# Patient Record
Sex: Male | Born: 1955 | Race: Black or African American | Hispanic: No | Smoking: Current every day smoker
Health system: Southern US, Community
[De-identification: ages and names within clinical notes are randomized; demographics above are authoritative.]

## PROBLEM LIST (undated history)

## (undated) DIAGNOSIS — Z8673 Personal history of transient ischemic attack (TIA), and cerebral infarction without residual deficits: Secondary | ICD-10-CM

## (undated) DIAGNOSIS — Z0000 Encounter for general adult medical examination without abnormal findings: Secondary | ICD-10-CM

## (undated) DIAGNOSIS — R7989 Other specified abnormal findings of blood chemistry: Secondary | ICD-10-CM

## (undated) DIAGNOSIS — R7881 Bacteremia: Secondary | ICD-10-CM

## (undated) DIAGNOSIS — A419 Sepsis, unspecified organism: Secondary | ICD-10-CM

## (undated) DIAGNOSIS — I69993 Ataxia following unspecified cerebrovascular disease: Secondary | ICD-10-CM

## (undated) DIAGNOSIS — I639 Cerebral infarction, unspecified: Secondary | ICD-10-CM

## (undated) DIAGNOSIS — R509 Fever, unspecified: Secondary | ICD-10-CM

## (undated) DIAGNOSIS — I10 Essential (primary) hypertension: Secondary | ICD-10-CM

## (undated) DIAGNOSIS — F17200 Nicotine dependence, unspecified, uncomplicated: Secondary | ICD-10-CM

## (undated) DIAGNOSIS — I679 Cerebrovascular disease, unspecified: Secondary | ICD-10-CM

## (undated) DIAGNOSIS — H532 Diplopia: Secondary | ICD-10-CM

## (undated) DIAGNOSIS — R4189 Other symptoms and signs involving cognitive functions and awareness: Secondary | ICD-10-CM

## (undated) DIAGNOSIS — R4182 Altered mental status, unspecified: Secondary | ICD-10-CM

## (undated) DIAGNOSIS — E785 Hyperlipidemia, unspecified: Secondary | ICD-10-CM

## (undated) DIAGNOSIS — F209 Schizophrenia, unspecified: Secondary | ICD-10-CM

## (undated) DIAGNOSIS — I6330 Cerebral infarction due to thrombosis of unspecified cerebral artery: Secondary | ICD-10-CM

## (undated) DIAGNOSIS — D72829 Elevated white blood cell count, unspecified: Secondary | ICD-10-CM

## (undated) DIAGNOSIS — R2990 Unspecified symptoms and signs involving the nervous system: Secondary | ICD-10-CM

## (undated) DIAGNOSIS — I5022 Chronic systolic (congestive) heart failure: Secondary | ICD-10-CM

## (undated) DIAGNOSIS — N10 Acute tubulo-interstitial nephritis: Secondary | ICD-10-CM

## (undated) DIAGNOSIS — R42 Dizziness and giddiness: Secondary | ICD-10-CM

## (undated) DIAGNOSIS — E119 Type 2 diabetes mellitus without complications: Secondary | ICD-10-CM

## (undated) DIAGNOSIS — H5120 Internuclear ophthalmoplegia, unspecified eye: Secondary | ICD-10-CM

## (undated) DIAGNOSIS — R778 Other specified abnormalities of plasma proteins: Secondary | ICD-10-CM

## (undated) DIAGNOSIS — I5032 Chronic diastolic (congestive) heart failure: Secondary | ICD-10-CM

## (undated) DIAGNOSIS — I110 Hypertensive heart disease with heart failure: Secondary | ICD-10-CM

## (undated) HISTORY — DX: Other specified abnormal findings of blood chemistry: R79.89

## (undated) HISTORY — DX: Sepsis, unspecified organism: A41.9

## (undated) HISTORY — DX: Fever, unspecified: R50.9

## (undated) HISTORY — DX: Type 2 diabetes mellitus without complications: E11.9

## (undated) HISTORY — DX: Schizophrenia, unspecified: F20.9

## (undated) HISTORY — DX: Altered mental status, unspecified: R41.82

## (undated) HISTORY — DX: Other symptoms and signs involving cognitive functions and awareness: R41.89

## (undated) HISTORY — DX: Cerebrovascular disease, unspecified: I67.9

## (undated) HISTORY — DX: Internuclear ophthalmoplegia, unspecified eye: H51.20

## (undated) HISTORY — DX: Essential (primary) hypertension: I10

## (undated) HISTORY — DX: Encounter for general adult medical examination without abnormal findings: Z00.00

## (undated) HISTORY — DX: Dizziness and giddiness: R42

## (undated) HISTORY — DX: Chronic diastolic (congestive) heart failure: I50.32

## (undated) HISTORY — DX: Hyperlipidemia, unspecified: E78.5

## (undated) HISTORY — DX: Ataxia following unspecified cerebrovascular disease: I69.993

## (undated) HISTORY — DX: Personal history of transient ischemic attack (TIA), and cerebral infarction without residual deficits: Z86.73

## (undated) HISTORY — DX: Nicotine dependence, unspecified, uncomplicated: F17.200

## (undated) HISTORY — DX: Bacteremia: R78.81

## (undated) HISTORY — DX: Chronic systolic (congestive) heart failure: I50.22

## (undated) HISTORY — DX: Cerebral infarction due to thrombosis of unspecified cerebral artery: I63.30

## (undated) HISTORY — DX: Acute pyelonephritis: N10

## (undated) HISTORY — DX: Unspecified symptoms and signs involving the nervous system: R29.90

## (undated) HISTORY — DX: Diplopia: H53.2

## (undated) HISTORY — DX: Elevated white blood cell count, unspecified: D72.829

## (undated) HISTORY — DX: Cerebral infarction, unspecified: I63.9

## (undated) HISTORY — DX: Hypertensive heart disease with heart failure: I11.0

## (undated) HISTORY — DX: Other specified abnormalities of plasma proteins: R77.8

---

## 2008-03-11 ENCOUNTER — Inpatient Hospital Stay (HOSPITAL_COMMUNITY): Admission: EM | Admit: 2008-03-11 | Discharge: 2008-03-14 | Payer: Self-pay | Admitting: Emergency Medicine

## 2008-04-08 ENCOUNTER — Encounter (INDEPENDENT_AMBULATORY_CARE_PROVIDER_SITE_OTHER): Payer: Self-pay | Admitting: *Deleted

## 2008-04-08 ENCOUNTER — Ambulatory Visit: Payer: Self-pay | Admitting: Internal Medicine

## 2008-04-08 DIAGNOSIS — E785 Hyperlipidemia, unspecified: Secondary | ICD-10-CM | POA: Insufficient documentation

## 2008-04-08 DIAGNOSIS — Z8619 Personal history of other infectious and parasitic diseases: Secondary | ICD-10-CM

## 2008-04-08 DIAGNOSIS — F17200 Nicotine dependence, unspecified, uncomplicated: Secondary | ICD-10-CM | POA: Insufficient documentation

## 2008-04-08 HISTORY — DX: Nicotine dependence, unspecified, uncomplicated: F17.200

## 2008-04-08 HISTORY — DX: Hyperlipidemia, unspecified: E78.5

## 2008-04-08 LAB — CONVERTED CEMR LAB: Microalb Creat Ratio: 10.4 mg/g (ref 0.0–30.0)

## 2008-08-09 ENCOUNTER — Inpatient Hospital Stay (HOSPITAL_COMMUNITY): Admission: EM | Admit: 2008-08-09 | Discharge: 2008-08-10 | Payer: Self-pay | Admitting: Emergency Medicine

## 2008-08-09 ENCOUNTER — Ambulatory Visit: Payer: Self-pay | Admitting: Internal Medicine

## 2008-08-09 ENCOUNTER — Encounter: Payer: Self-pay | Admitting: Internal Medicine

## 2008-08-19 ENCOUNTER — Telehealth: Payer: Self-pay | Admitting: *Deleted

## 2008-09-10 ENCOUNTER — Encounter (INDEPENDENT_AMBULATORY_CARE_PROVIDER_SITE_OTHER): Payer: Self-pay | Admitting: *Deleted

## 2008-09-10 ENCOUNTER — Ambulatory Visit: Payer: Self-pay | Admitting: Internal Medicine

## 2008-09-10 LAB — CONVERTED CEMR LAB: Hgb A1c MFr Bld: 7.4 %

## 2008-09-11 ENCOUNTER — Encounter (INDEPENDENT_AMBULATORY_CARE_PROVIDER_SITE_OTHER): Payer: Self-pay | Admitting: *Deleted

## 2008-09-11 ENCOUNTER — Ambulatory Visit: Payer: Self-pay | Admitting: Internal Medicine

## 2008-09-11 LAB — CONVERTED CEMR LAB
ALT: 11 units/L (ref 0–53)
AST: 20 units/L (ref 0–37)
Calcium: 9.2 mg/dL (ref 8.4–10.5)
Cholesterol: 133 mg/dL (ref 0–200)
Creatinine, Ser: 1.3 mg/dL (ref 0.40–1.50)
LDL Cholesterol: 89 mg/dL (ref 0–99)
Total Bilirubin: 0.4 mg/dL (ref 0.3–1.2)
Total CHOL/HDL Ratio: 3.9
Total Protein: 6.9 g/dL (ref 6.0–8.3)
Triglycerides: 50 mg/dL (ref <150)

## 2009-02-10 ENCOUNTER — Telehealth (INDEPENDENT_AMBULATORY_CARE_PROVIDER_SITE_OTHER): Payer: Self-pay | Admitting: Internal Medicine

## 2009-11-06 ENCOUNTER — Ambulatory Visit: Payer: Self-pay | Admitting: Cardiology

## 2009-11-06 ENCOUNTER — Encounter: Payer: Self-pay | Admitting: Internal Medicine

## 2009-11-06 ENCOUNTER — Ambulatory Visit: Payer: Self-pay | Admitting: Internal Medicine

## 2009-11-06 ENCOUNTER — Ambulatory Visit: Payer: Self-pay | Admitting: Infectious Diseases

## 2009-11-06 ENCOUNTER — Telehealth: Payer: Self-pay | Admitting: Internal Medicine

## 2009-11-06 ENCOUNTER — Inpatient Hospital Stay (HOSPITAL_COMMUNITY): Admission: EM | Admit: 2009-11-06 | Discharge: 2009-11-13 | Payer: Self-pay | Admitting: Emergency Medicine

## 2009-11-06 DIAGNOSIS — R269 Unspecified abnormalities of gait and mobility: Secondary | ICD-10-CM

## 2009-11-07 ENCOUNTER — Encounter: Payer: Self-pay | Admitting: Internal Medicine

## 2009-11-07 ENCOUNTER — Ambulatory Visit: Payer: Self-pay | Admitting: Vascular Surgery

## 2009-11-11 ENCOUNTER — Ambulatory Visit: Payer: Self-pay | Admitting: Physical Medicine & Rehabilitation

## 2009-11-12 ENCOUNTER — Encounter: Payer: Self-pay | Admitting: Internal Medicine

## 2009-11-13 ENCOUNTER — Inpatient Hospital Stay (HOSPITAL_COMMUNITY)
Admission: RE | Admit: 2009-11-13 | Discharge: 2009-11-19 | Payer: Self-pay | Admitting: Physical Medicine & Rehabilitation

## 2009-11-13 ENCOUNTER — Ambulatory Visit: Payer: Self-pay | Admitting: Physical Medicine & Rehabilitation

## 2009-11-13 ENCOUNTER — Encounter: Payer: Self-pay | Admitting: Internal Medicine

## 2009-11-24 ENCOUNTER — Encounter: Payer: Self-pay | Admitting: Internal Medicine

## 2009-12-01 ENCOUNTER — Ambulatory Visit: Payer: Self-pay | Admitting: Internal Medicine

## 2009-12-01 DIAGNOSIS — Z8673 Personal history of transient ischemic attack (TIA), and cerebral infarction without residual deficits: Secondary | ICD-10-CM

## 2009-12-01 DIAGNOSIS — I10 Essential (primary) hypertension: Secondary | ICD-10-CM

## 2009-12-01 LAB — CONVERTED CEMR LAB
ALT: 14 units/L (ref 0–53)
AST: 18 units/L (ref 0–37)
Alkaline Phosphatase: 43 units/L (ref 39–117)
CO2: 27 meq/L (ref 19–32)
Calcium: 9 mg/dL (ref 8.4–10.5)
Creatinine, Ser: 1.17 mg/dL (ref 0.40–1.50)
Free T4: 0.96 ng/dL (ref 0.80–1.80)
Glucose, Bld: 98 mg/dL (ref 70–99)
Potassium: 4.2 meq/L (ref 3.5–5.3)
TSH: 0.603 microintl units/mL (ref 0.350–4.5)

## 2009-12-22 ENCOUNTER — Encounter
Admission: RE | Admit: 2009-12-22 | Discharge: 2009-12-23 | Payer: Self-pay | Admitting: Physical Medicine & Rehabilitation

## 2009-12-23 ENCOUNTER — Ambulatory Visit: Payer: Self-pay | Admitting: Physical Medicine & Rehabilitation

## 2010-01-02 ENCOUNTER — Ambulatory Visit: Payer: Self-pay | Admitting: Infectious Disease

## 2010-01-02 LAB — CONVERTED CEMR LAB
Blood Glucose, Fingerstick: 261
Hgb A1c MFr Bld: 6.6 %

## 2010-01-30 ENCOUNTER — Ambulatory Visit: Payer: Self-pay | Admitting: Internal Medicine

## 2010-02-09 ENCOUNTER — Telehealth: Payer: Self-pay | Admitting: Internal Medicine

## 2010-02-23 ENCOUNTER — Telehealth: Payer: Self-pay | Admitting: Internal Medicine

## 2010-03-04 ENCOUNTER — Ambulatory Visit: Payer: Self-pay | Admitting: Internal Medicine

## 2010-03-04 DIAGNOSIS — K089 Disorder of teeth and supporting structures, unspecified: Secondary | ICD-10-CM | POA: Insufficient documentation

## 2010-03-04 LAB — HM DIABETES FOOT EXAM

## 2010-03-04 LAB — CONVERTED CEMR LAB: Blood Glucose, Fingerstick: 119

## 2010-07-01 ENCOUNTER — Telehealth: Payer: Self-pay | Admitting: Internal Medicine

## 2010-09-15 NOTE — Miscellaneous (Signed)
Summary: Hospital Admission  INTERNAL MEDICINE ADMISSION HISTORY AND PHYSICAL  PCP: Dr. Quentin MullingDenton Meek [phone] R2:Vega [phone] Attending: Dr. Doneen Poisson   FA:[id number] numbness  HPI: 55 y/o man with hx of diambetes, HLD, and schizophrenia, presented to ED with symptoms that started at 8 am on the day of admission; right after eating breakfast when he noticed that his right leg felt numb; he tried to get up and had a trouble walking. He felt that his left RE was "weak" and felt "off balance." He denied vertigo, tinnitus, HA, visual or speech deficits, facial assymtery, drooling, chest pain, SOB, or other neurologic symptoms. Episode is new to the patient; was not witnessed by anyone.  ALLERGIES: NKDA  PAST MEDICAL HISTORY: Diabetes mellitus, type II -->Hgb A1C 7.4 on 08/2009. Schizophrenia HLD -> cholesterol 133; TG 50; HDL 34; LDL 89  MEDICATIONS: METFORMIN HCL 500 MG TABS (METFORMIN HCL) Take two tablets by mouth in the morning, and one tablet in the evening ADULT ASPIRIN EC LOW STRENGTH 81 MG TBEC (ASPIRIN) Take one pill by mouth daily -->not taking NIASPAN 500 MG CR-TABS (NIACIN (ANTIHYPERLIPIDEMIC)) Take one pill by mouth daily-->not taking   SOCIAL HISTORY: Single; lives with his sister Current Smoker :1PPDx 7 years Alcohol use-no Drug use-no Regular exercise-yes   FAMILY HISTORY  Family History of CAD Male 1st degree relative <60 Family History of CAD Male 1st degree relative <50 Family History Diabetes 1st degree relative Family History Hypertension  ROS:per HPI  VITALS: T: 97.5  P:61  BP: 182/92> 173/86 R: 16 O2SAT:98% on RA PHYSICAL EXAM: General:  alert, well-developed, and cooperative to examination.   Head:  normocephalic and atraumatic.   Eyes:  vision grossly intact, pupils equal, pupils round, pupils reactive to light, no injection and anicteric.   Mouth:  pharynx pink and moist, no erythema, and no exudates.   Neck:  supple, full ROM,  no thyromegaly, no JVD, and no carotid bruits.   Lungs:  normal respiratory effort, no accessory muscle use, normal breath sounds, no crackles, and no wheezes. CV: RRR, no M, S3, S4, no lifts or rubs. Abdomen: ND; BS+, NTTP, no HSM MSK: FROM of all extremities proximately and distally bialterally; no joint erythema, effusion or increased warmth to touch bilaterally. Neurologic:  alert & oriented X3, cranial nerves III-XII intact, strength normal in all extremities, sensation intact to light touch on the left; and absent to R UE (C4-7 dermatopmes) and R LE (L2-S1 dermatomes), and gait is slightly ataxic; unable to perform Romberg test due to lack of understanding. Skin:  turgor normal and no rashes.   Psych:  Oriented X3, memory intact for recent and remote, minimally interactive, good eye contact, not anxious appearing, and flat affect.  LABS: WBC                                      9.4               4.0-10.5         K/uL  RBC                                      4.99              4.22-5.81        MIL/uL  Hemoglobin (HGB)  14.2              13.0-17.0        g/dL  Hematocrit (HCT)                         43.4              39.0-52.0        %  MCV                                      87.0              78.0-100.0       fL  MCHC                                     32.8              30.0-36.0        g/dL  RDW                                      14.7              11.5-15.5        %  Platelet Count (PLT)                     176               150-400          K/uL  Neutrophils, %                           66                43-77            %  Lymphocytes, %                           24                12-46            %  Monocytes, %                             7                 3-12             %  Eosinophils, %                           3                 0-5              %  Basophils, %                             1                 0-1              %  Neutrophils, Absolute                     6.2               1.7-7.7          K/uL  Lymphocytes, Absolute                    2.2               0.7-4.0          K/uL  Monocytes, Absolute                      0.6               0.1-1.0          K/uL  Eosinophils, Absolute                    0.2               0.0-0.7          K/uL  Basophils, Absolute                      0.1               0.0-0.1          K/uL  Protime ( Prothrombin Time)              13.2              11.6-15.2        seconds  INR                                      1.01              0.00-1.49  PTT(a-Partial Thromboplastn Time)        28                24-37            seconds  Sodium (NA)                              138               135-145          mEq/L  Potassium (K)                            4.3               3.5-5.1          mEq/L  Chloride                                 104               96-112           mEq/L  CO2                                      26  19-32            mEq/L  Glucose                                  132        h      70-99            mg/dL  BUN                                      12                6-23             mg/dL  Creatinine                               1.23(1.3 in 08/2009)             0.4-1.5          mg/dL  GFR, Est Non African American            >60               >60              mL/min  GFR, Est African American                >60               >60              mL/min    Oversized comment, see footnote  1  Bilirubin, Total                         0.4               0.3-1.2          mg/dL  Alkaline Phosphatase                     46                39-117           U/L  SGOT (AST)                               24                0-37             U/L  SGPT (ALT)                               14                0-53             U/L  Total  Protein                           7.7               6.0-8.3          g/dL  Albumin-Blood  3.8               3.5-5.2          g/dL  Calcium                                   9.3               8.4-10.5         mg/dL  Creatine Kinase, Total                   274        h      7-232            U/L  CK, MB                                   3.8               0.3-4.0          ng/mL  Relative Index                           1.4               0.0-2.5  Troponin I                               <0.01             0.00-0.06        ng/mL    NO INDICATION OF    MYOCARDIAL INJURY.  CR OF HEAD w/o contrast. IMPRESSION:   No identifiably acute abnormality.  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   Indeterminate age white matter infarctions within the cerebral   hemispheres, likely old.  ASSESSMENT AND PLAN: (1)Right -sided paresthesia with subjective motor weakness. Although CT of head was negative, symptoms are  concerning for a TIA vs small CVA (cerebellar infarct). Additional differential list of diagnoses is extensive and include illicit drug OD, hypothyroidism,sarcoidoses, lead poisoning, Diabetic neuropathy, paraneoplastic syndrome; HIV infection, neurosyphilis, chronic inflammatory demyelinating plyenuropathy, spinal cord lesion vs nerve root compression, migraine, partial seizure, and MS. Schizophrenia -related conversion disorder vs catatonia; postprandial hypotension. Will check UDS, TSH, HIV, RPR, 2D ECHO, EKG, MRA and MRI of brain. ASA, Crestor; Fall precautions. (2)DM: Check A1c, will cover with SSI. (3)HTN (?) : no known history of HTN although last reading taken in 10/2009 was 152/96. Will not initiate anti-hypertensive therapy for now. Will monitor closely. (4)Schizophrenia: patient is not currently on any known antispychotic therapy. Has some mild negative symptoms; otherwise appears stable. (5)VTE PROPH: lovenox

## 2010-09-15 NOTE — Progress Notes (Signed)
Summary: med refill/gp  Phone Note Refill Request Message from:  Fax from Pharmacy on November 06, 2009 2:37 PM  Refills Requested: Medication #1:  METFORMIN HCL 500 MG TABS Take two tablets by mouth in the morning   Dosage confirmed as above?Dosage Confirmed   Last Refilled: 10/13/2009  Method Requested: Electronic Initial call taken by: Chinita Pester RN,  November 06, 2009 2:38 PM    Prescriptions: METFORMIN HCL 500 MG TABS (METFORMIN HCL) Take two tablets by mouth in the morning, and one tablet in the evening  #93 x 5   Entered and Authorized by:   Jackson Latino MD   Signed by:   Jackson Latino MD on 11/07/2009   Method used:   Electronically to        [facility] [id number] (retail)       [address] [place], [place]  [address]       Ph: [phone]       Fax: [phone]   RxID:   [id number]

## 2010-09-15 NOTE — Miscellaneous (Signed)
Summary: Hospital discharge summary  Hospital Discharge  Date of admission:11/06/2009  Date of discharge:anticipated 11/14/2009 -->transfer to an inpatient rehabilitation.  Brief reason for admission/active problems: 1. 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 Otherwise, neurologically stable. Started on ASA and crestor; transfer for CIR for  PT/OT is pending. 2. HTN --prinivil 10 mg by mouth daily. 3. DM, type 2 --controlled. 4. Schizophrenia --not on any antipsychotic meds; being followed by mental health depratment; stable.  Followup needed:Reassess anti-HTN regimen; and neurological fxn.  The medication and problem lists have been updated.  Please see the dictated discharge summary for details.   Medications: Removed medication of NIASPAN 500 MG CR-TABS (NIACIN (ANTIHYPERLIPIDEMIC)) Take one pill by mouth daily. - Signed Added new medication of CRESTOR 10 MG TABS (ROSUVASTATIN CALCIUM) Take 1 tab by mouth at bedtime - Signed Rx of CRESTOR 10 MG TABS (ROSUVASTATIN CALCIUM) Take 1 tab by mouth at bedtime;  #30 x 3;  Signed;  Entered by: Deatra Robinson MD;  Authorized by: Deatra Robinson MD;  Method used: Electronically to [facility] [id number] [place], [place], [place]  [address], Ph: [phone], Fax: [phone] Observations: Added new observation of INSTRUCTIONS: Please make an appointment at the outpatient clinic at [facility]  for a followup visit once being discharge from physical therapy inpatient rehabilitation.  Please take your medication as prescribed below. (11/12/2009 10:30)    Prescriptions: CRESTOR 10 MG TABS (ROSUVASTATIN CALCIUM) Take 1 tab by mouth at bedtime  #30 x 3   Entered and Authorized by:   Deatra Robinson MD   Signed by:   Deatra Robinson MD on 11/12/2009   Method used:   Electronically to        [facility] [id number] (retail)       [address]  [place], [place]  [address]       Ph: [phone]       Fax: [phone]   RxID:   [id number]    Patient Instructions: 1)  Please make an appointment at the outpatient clinic at [facility]  for a followup visit once being discharge from physical therapy inpatient rehabilitation. 2)  Please take your medication as prescribed below.

## 2010-09-15 NOTE — Progress Notes (Signed)
Summary: med refill/gp  Phone Note Refill Request Message from:  Patient's sister on February 09, 2010 9:33 AM  Refills Requested: Medication #1:  METFORMIN HCL 500 MG TABS Take two tablets by mouth in the morning   Dosage confirmed as above?Dosage Confirmed He takes 2 tabs in the morning and 2 tabs in the evening;his sister stated he has not been receving enough pills  to last the month.   Method Requested: Electronic Initial call taken by: Chinita Pester RN,  February 09, 2010 9:33 AM  Follow-up for Phone Call        Rx completed in Dr. Tiajuana Amass Follow-up by: Jackson Latino MD,  February 09, 2010 1:27 PM    Prescriptions: METFORMIN HCL 500 MG TABS (METFORMIN HCL) Take two tablets by mouth in the morning, and two tablets in the evening  #120 x 6   Entered and Authorized by:   Jackson Latino MD   Signed by:   Jackson Latino MD on 02/09/2010   Method used:   Electronically to        [facility] [id number] (retail)       [address] [place], [place]  [address]       Ph: [phone]       Fax: [phone]   RxID:   [id number]

## 2010-09-15 NOTE — Assessment & Plan Note (Signed)
Summary: EST-1 MONTH F/U VISIT PER [facility]   Vital Signs:  Patient profile:   55 year old male Height:      70 inches (177.80 cm) Weight:      169.01 pounds (76.82 kg) BMI:     24.34 Temp:     98.8 degrees F (37.11 degrees C) oral Pulse rate:   68 / minute BP sitting:   135 / 84  (left arm)  Vitals Entered By: Angelina Ok RN (January 30, 2010 9:56 AM) Is Patient Diabetic? Yes Did you bring your meter with you today? Yes Pain Assessment Patient in pain? no      Nutritional Status BMI of 19 -24 = normal CBG Result 158  Have you ever been in a relationship where you felt threatened, hurt or afraid?No   Does patient need assistance? Functional Status Self care Ambulation Normal Comments Check up since changing meds.   Primary Care Provider:  Jackson Latino MD   History of Present Illness: 55 yo m with Past Medical History of DM, HTN comes to the office for a follow up after recent change of his medications. During last visit, we increased his metformoin to 1000mg  two times a day from 500mg  and stopped HCTZ due to soft BP. He has been doing well since that change, no chest pain, SOB, or fever. His CBG has been well controlled runs 92-160s. No diarrhea OR dysuria, no melena. Current  smoker about 1/2 PPD, no ETOH or drug abuse.   Depression History:      The patient denies a depressed mood most of the day and a diminished interest in his usual daily activities.         Preventive Screening-Counseling & Management  Alcohol-Tobacco     Smoking Status: quit < 6 months     Smoking Cessation Counseling: yes     Packs/Day: < a pack per week     Year Started: 20 years     Year Quit: 10/2009  Comments: Smokes every now and then now  Problems Prior to Update: 1)  Cerebrovascular Accident, Hx of  (ICD-V12.50) 2)  Hypertension, Mild  (ICD-401.1) 3)  Gait Imbalance  (ICD-781.2) 4)  Diabetes Mellitus, Type II  (ICD-250.00) 5)  Tobacco Abuse  (ICD-305.1) 6)  Dyslipidemia   (ICD-272.4) 7)  Diabetes-type 2  (ICD-250.00) 8)  Dm  (ICD-250.00) 9)  Candidiasis, Oral, Hx of  (ICD-V12.09)  Medications Prior to Update: 1)  Metformin Hcl 500 Mg Tabs (Metformin Hcl) .... Take Two Tablets By Mouth in The Morning, and Two Tablets in The Evening 2)  Ecotrin 325 Mg Tbec (Aspirin) .... Take 1 Tablet By Mouth Once A Day 3)  Crestor 20 Mg Tabs (Rosuvastatin Calcium) .... Take 1 Tablet By Mouth Once A Day 4)  Lisinopril 20 Mg Tabs (Lisinopril) .... Take 1 Tablet By Mouth Once A Day  Current Medications (verified): 1)  Metformin Hcl 500 Mg Tabs (Metformin Hcl) .... Take Two Tablets By Mouth in The Morning, and Two Tablets in The Evening 2)  Ecotrin 325 Mg Tbec (Aspirin) .... Take 1 Tablet By Mouth Once A Day 3)  Crestor 20 Mg Tabs (Rosuvastatin Calcium) .... Take 1 Tablet By Mouth Once A Day 4)  Lisinopril 20 Mg Tabs (Lisinopril) .... Take 1 Tablet By Mouth Once A Day  Allergies (verified): No Known Drug Allergies  Past History:  Past Medical History: Last updated: 04/08/2008 Diabetes mellitus, type II  Family History: Last updated: 04/08/2008 Family History of CAD Male 1st  degree relative <60 Family History of CAD Male 1st degree relative <50 Family History Diabetes 1st degree relative Family History Hypertension  Social History: Last updated: 04/08/2008 Single Current Smoker Alcohol use-no Drug use-no Regular exercise-yes  Risk Factors: Smoking Status: quit < 6 months (01/30/2010) Packs/Day: < a pack per week (01/30/2010)  Family History: Reviewed history from 04/08/2008 and no changes required. Family History of CAD Male 1st degree relative <60 Family History of CAD Male 1st degree relative <50 Family History Diabetes 1st degree relative Family History Hypertension  Social History: Reviewed history from 04/08/2008 and no changes required. Single Current Smoker Alcohol use-no Drug use-no Regular exercise-yes Packs/Day:  < a pack per  week  Review of Systems  The patient denies anorexia, fever, decreased hearing, chest pain, syncope, dyspnea on exertion, peripheral edema, abdominal pain, melena, hematochezia, and abnormal bleeding.    Physical Exam  General:  alert, well-developed, well-nourished, and well-hydrated.   Head:  normocephalic.   Nose:  no nasal discharge.   Mouth:  pharynx pink and moist.   Neck:  supple.   Lungs:  normal respiratory effort, normal breath sounds, no crackles, and no wheezes.   Heart:  normal rate, regular rhythm, no murmur, and no JVD.   Abdomen:  soft, non-tender, normal bowel sounds, and no masses.   Msk:  normal ROM, no joint tenderness, no joint swelling, and no joint warmth.   Pulses:  2+ Extremities:  Edema. Neurologic:  alert & oriented X3, cranial nerves II-XII intact, strength normal in all extremities, sensation intact to light touch, and gait normal.     Impression & Recommendations:  Problem # 1:  DIABETES MELLITUS, TYPE II (ICD-250.00) Assessment Improved His DM well controlle dand at target. Will continue current meds. His updated medication list for this problem includes:    Metformin Hcl 500 Mg Tabs (Metformin hcl) .Marland Kitchen... Take two tablets by mouth in the morning, and two tablets in the evening    Ecotrin 325 Mg Tbec (Aspirin) .Marland Kitchen... Take 1 tablet by mouth once a day    Lisinopril 20 Mg Tabs (Lisinopril) .Marland Kitchen... Take 1 tablet by mouth once a day  Labs Reviewed: Creat: 1.17 (12/01/2009)    Reviewed HgBA1c results: 6.6 (01/02/2010)  7.4 (09/10/2008)  Problem # 2:  HYPERTENSION, MILD (ICD-401.1) Assessment: Improved Since last visit we stopped his HCTZ due to soft BP, his dizziness has resolved. Will continue lisinopril and recheck BP at next visit.  His updated medication list for this problem includes:    Lisinopril 20 Mg Tabs (Lisinopril) .Marland Kitchen... Take 1 tablet by mouth once a day  Problem # 3:  TOBACCO ABUSE (ICD-305.1) Assessment: Comment Only  Encouraged  smoking cessation and discussed different methods for smoking cessation.   Problem # 4:  Preventive Health Care (ICD-V70.0) Assessment: Comment Only He missed GI appointment in May, we rescheduled it. He will see GI doctor on 02/02/2010 for setup screening colonoscopy.  Complete Medication List: 1)  Metformin Hcl 500 Mg Tabs (Metformin hcl) .... Take two tablets by mouth in the morning, and two tablets in the evening 2)  Ecotrin 325 Mg Tbec (Aspirin) .... Take 1 tablet by mouth once a day 3)  Crestor 20 Mg Tabs (Rosuvastatin calcium) .... Take 1 tablet by mouth once a day 4)  Lisinopril 20 Mg Tabs (Lisinopril) .... Take 1 tablet by mouth once a day  Patient Instructions: 1)  Please schedule a follow-up appointment in 6 months. 2)  Tobacco is very bad for your health  and your loved ones! You Should stop smoking!. 3)  Stop Smoking Tips: Choose a Quit date. Cut down before the Quit date. decide what you will do as a substitute when you feel the urge to smoke(gum,toothpick,exercise). 4)  Check your blood sugars regularly. If your readings are usually above : or below 70 you should contact our office. 5)  See your eye doctor yearly to check for diabetic eye damage.   Vital Signs:  Patient profile:   55 year old male Height:      70 inches (177.80 cm) Weight:      169.01 pounds (76.82 kg) BMI:     24.34 Temp:     98.8 degrees F (37.11 degrees C) oral Pulse rate:   68 / minute BP sitting:   135 / 84  (left arm)  Vitals Entered By: Angelina Ok RN (January 30, 2010 9:56 AM)   Prevention & Chronic Care Immunizations   Influenza vaccine: Not documented   Influenza vaccine deferral: Deferred  (12/01/2009)    Tetanus booster: Not documented   Td booster deferral: Deferred  (12/01/2009)    Pneumococcal vaccine: Not documented  Colorectal Screening   Hemoccult: Not documented   Hemoccult action/deferral: Deferred  (12/01/2009)    Colonoscopy: Not documented   Colonoscopy  action/deferral: GI referral  (12/01/2009)  Other Screening   PSA: Not documented   Smoking status: quit < 6 months  (01/30/2010)  Diabetes Mellitus   HgbA1C: 6.6  (01/02/2010)    Eye exam: Not documented    Foot exam: Not documented   High risk foot: Not documented   Foot care education: Not documented    Urine microalbumin/creatinine ratio: 79.7  (12/01/2009)   Urine microalbumin action/deferral: Ordered    Diabetes flowsheet reviewed?: Yes   Progress toward A1C goal: Improved  Lipids   Total Cholesterol: 84  (12/01/2009)   Lipid panel action/deferral: Lipid Panel ordered   LDL: 41  (12/01/2009)   LDL Direct: Not documented   HDL: 26  (12/01/2009)   Triglycerides: 83  (12/01/2009)    SGOT (AST): 18  (12/01/2009)   BMP action: Ordered   SGPT (ALT): 14  (12/01/2009)   Alkaline phosphatase: 43  (12/01/2009)   Total bilirubin: 0.3  (12/01/2009)    Lipid flowsheet reviewed?: Yes   Progress toward LDL goal: At goal  Hypertension   Last Blood Pressure: 135 / 84  (01/30/2010)   Serum creatinine: 1.17  (12/01/2009)   BMP action: Ordered   Serum potassium 4.2  (12/01/2009)    Hypertension flowsheet reviewed?: Yes   Progress toward BP goal: At goal  Self-Management Support :   Personal Goals (by the next clinic visit) :     Personal A1C goal: 7  (12/01/2009)     Personal blood pressure goal: 130/80  (12/01/2009)     Personal LDL goal: 100  (12/01/2009)    Patient will work on the following items until the next clinic visit to reach self-care goals:     Medications and monitoring: take my medicines every day, check my blood sugar, bring all of my medications to every visit, examine my feet every day  (01/30/2010)     Eating: drink diet soda or water instead of juice or soda, eat more vegetables, use fresh or frozen vegetables, eat foods that are low in salt, eat baked foods instead of fried foods, eat fruit for snacks and desserts, limit or avoid alcohol  (01/30/2010)      Activity: take a 30 minute  walk every day  (01/30/2010)    Diabetes self-management support: Copy of home glucose meter record, Written self-care plan, Education handout, Pre-printed educational material, Resources for patients handout  (01/30/2010)   Diabetes care plan printed   Diabetes education handout printed    Hypertension self-management support: Education handout, Pre-printed educational material, Written self-care plan, Resources for patients handout  (01/30/2010)   Hypertension self-care plan printed.   Hypertension education handout printed    Lipid self-management support: Education handout, Pre-printed educational material, Written self-care plan, Resources for patients handout  (01/30/2010)   Lipid self-care plan printed.   Lipid education handout printed      Resource handout printed.

## 2010-09-15 NOTE — Progress Notes (Signed)
Summary: dental assessment/ hla  Phone Note Call from Patient   Caller: sister Reason for Call: Referral Summary of Call: pt's sister, caretaker calls stating pt is c/o of dental pain on and off, multiple areas, was evaluated by a dentist and was told all teeth needed extraction, pt cannot afford to pay out of pocket, spoke w/ dental clinic and needs eval and referral. appt set, pt agreeable. chewing and drinking aggravate and nothing tends to alleviate. pt's sister is reminded that if pt becomes worse and thinks he needs eval before scheduled appt he may call clinic or use ED or urgent care. this is agreeable and sister repeats back. Initial call taken by: Marin Roberts RN,  February 23, 2010 9:12 AM  Follow-up for Phone Call        Agreed with the plan. Follow-up by: Jackson Latino MD,  February 24, 2010 12:06 PM

## 2010-09-15 NOTE — Miscellaneous (Signed)
Summary: [facility]   Imported By: Margie Billet 12/01/2009 15:54:54  _____________________________________________________________________  External Attachment:    Type:   Image     Comment:   External Document

## 2010-09-15 NOTE — Assessment & Plan Note (Signed)
Summary: dental pain on and off, needs eval/pcp-[name] Williams/hla   Vital Signs:  Patient profile:   55 year old male Height:      70 inches Weight:      168.7 pounds BMI:     24.29 Temp:     98.4 degrees F oral Pulse rate:   62 / minute BP sitting:   133 / 86  (right arm)  Vitals Entered By: Filomena Jungling NT II (March 04, 2010 1:35 PM) CC: DENTAL PAIN Is Patient Diabetic? Yes Did you bring your meter with you today? No Pain Assessment Patient in pain? yes     Location: teeth Intensity: 8 Type: aching Onset of pain  3 weeks Nutritional Status BMI of 19 -24 = normal CBG Result 119  Have you ever been in a relationship where you felt threatened, hurt or afraid?No   Does patient need assistance? Functional Status Self care Ambulation Normal   Diabetic Foot Exam Foot Inspection Is there a history of a foot ulcer?              No Is there a foot ulcer now?              No Can the patient see the bottom of their feet?          Yes Are the shoes appropriate in style and fit?          Yes Is there swelling or an abnormal foot shape?          No Are the toenails long?                Yes Are the toenails thick?                Yes Are the toenails ingrown?              No Is there heavy callous build-up?              Yes Is there pain in the calf muscle (Intermittent claudication) when walking?    NoIs there a claw toe deformity?              No Is there elevated skin temperature?            No Is there limited ankle dorsiflexion?            No Is there foot or ankle muscle weakness?            No  Diabetic Foot Care Education Patient educated on appropriate care of diabetic feet.  Comments: DRY SKIN   10-g (5.07) Semmes-Weinstein Monofilament Test Performed by: Filomena Jungling NT II          Right Foot          Left Foot Site 1         normal         normal Site 2         normal         normal Site 3         normal         normal Site 4         normal         normal Site 5          normal         normal Site 6         normal         normal Site  7         normal         normal Site 8         normal         normal Site 9         normal         normal  Impression      normal         normal   Primary Care Provider:  Jackson Latino MD  CC:  DENTAL PAIN.  History of Present Illness: He ia a 55 yo AAM with PMH of DM, HTN comes to the office for teeth pain. He has teeth pain for 3 weeks, no fever, drinage or injury. He has been taking his meds as instructed, no chest pain, SOB. His CBG has been well controlled runs 90-120s. No diarrhea or dysuria, no melena. Current  smoker about 2-3 cigarettes, no ETOH or drug abuse.   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 Take Two Tablets By Mouth in The Morning, and Two Tablets in The Evening 2)  Ecotrin 325 Mg Tbec (Aspirin) .... Take 1 Tablet By Mouth Once A Day 3)  Crestor 20 Mg Tabs (Rosuvastatin Calcium) .... Take 1 Tablet By Mouth Once A Day 4)  Lisinopril 20 Mg Tabs (Lisinopril) .... Take 1 Tablet By Mouth Once A Day  Current Medications (verified): 1)  Metformin Hcl 500 Mg Tabs (Metformin Hcl) .... Take Two Tablets By Mouth in The Morning, and Two Tablets in The Evening 2)  Ecotrin 325 Mg Tbec (Aspirin) .... Take 1 Tablet By Mouth Once A Day 3)  Crestor 20  Mg Tabs (Rosuvastatin Calcium) .... Take 1 Tablet By Mouth Once A Day 4)  Lisinopril 20 Mg Tabs (Lisinopril) .... Take 1 Tablet By Mouth Once A Day  Allergies (verified): No Known Drug Allergies  Past History:  Past Medical History: Last updated: 04/08/2008 Diabetes mellitus, type II  Family History: Last updated: 04/08/2008 Family History of CAD Male 1st degree relative <60 Family History of CAD Male 1st degree relative <50 Family History Diabetes 1st degree relative Family History Hypertension  Risk Factors: Smoking Status: quit < 6 months (03/04/2010) Packs/Day: < a pack per week (03/04/2010)  Family History: Reviewed history from 04/08/2008 and no changes required. Family History of CAD Male 1st degree relative <60 Family History of CAD Male 1st degree relative <50 Family History Diabetes 1st degree relative Family History Hypertension  Social History: Reviewed history from 04/08/2008 and no changes required. Single Current Smoker Alcohol use-no Drug use-no Regular exercise-yes  Review of Systems  The patient denies fever, vision loss, decreased hearing, dyspnea on exertion, peripheral edema, prolonged cough, hemoptysis, abdominal pain, and melena.    Physical  Exam  General:  alert, well-developed, well-nourished, and well-hydrated.   Nose:  no nasal discharge.   Mouth:  poor dentition and teeth missing.   Neck:  supple.   Lungs:  normal respiratory effort, no accessory muscle use, normal breath sounds, no crackles, and no wheezes.   Heart:  normal rate, regular rhythm, no murmur, no JVD, and no HJR.   Abdomen:  soft, non-tender, normal bowel sounds, no distention, and no masses.   Msk:  normal ROM and no joint tenderness.   Pulses:  2+ Extremities:  No edema. Neurologic:  alert & oriented X3, cranial nerves II-XII intact, strength normal in all extremities, sensation intact to light touch, and gait normal.    Diabetes Management Exam:    Foot Exam  (with socks and/or shoes not present):       Sensory-Monofilament:          Left foot: normal          Right foot: normal   Impression & Recommendations:  Problem # 1:  DIABETES MELLITUS, TYPE II (ICD-250.00) Assessment Unchanged His DM well controlled on metformin and will continue this. He has not done eye exam for 2 years, will have eye referral this annual DM eye exam. Foot exam done today.  His updated medication list for this problem includes:    Metformin Hcl 500 Mg Tabs (Metformin hcl) .Marland Kitchen... Take two tablets by mouth in the morning, and two tablets in the evening    Ecotrin 325 Mg Tbec (Aspirin) .Marland Kitchen... Take 1 tablet by mouth once a day    Lisinopril 20 Mg Tabs (Lisinopril) .Marland Kitchen... Take 1 tablet by mouth once a day  Labs Reviewed: Creat: 1.17 (12/01/2009)    Reviewed HgBA1c results: 6.6 (01/02/2010)  7.4 (09/10/2008)  Problem # 2:  DENTAL PAIN (ICD-525.9) Assessment: New He has poor dentition, will have dental referral for further evaluation.  Orders: Dental Referral (Dentist)  Problem # 3:  HYPERTENSION, MILD (ICD-401.1) Assessment: Unchanged BP well controlled, continue ACEI.  His updated medication list for this problem includes:    Lisinopril 20 Mg Tabs (Lisinopril) .Marland Kitchen... Take 1 tablet by mouth once a day  BP today: 133/86 Prior BP: 135/84 (01/30/2010)  Labs Reviewed: K+: 4.2 (12/01/2009) Creat: : 1.17 (12/01/2009)   Chol: 84 (12/01/2009)   HDL: 26 (12/01/2009)   LDL: 41 (12/01/2009)   TG: 83 (12/01/2009)  Problem # 4:  TOBACCO ABUSE (ICD-305.1) Assessment: Comment Only  He has cut his smoking to 2-3 cigarettes per day. Encouraged smoking cessation and discussed different methods for smoking cessation. He will cut further and possible quit.   Complete Medication List: 1)  Metformin Hcl 500 Mg Tabs (Metformin hcl) .... Take two tablets by mouth in the morning, and two tablets in the evening 2)  Ecotrin 325 Mg Tbec (Aspirin) .... Take 1 tablet by mouth once a  day 3)  Crestor 20 Mg Tabs (Rosuvastatin calcium) .... Take 1 tablet by mouth once a day 4)  Lisinopril 20 Mg Tabs (Lisinopril) .... Take 1 tablet by mouth once a day  Other Orders: Ophthalmology Referral (Ophthalmology)  Patient Instructions: 1)  Please schedule a follow-up appointment in 6 months. 2)  Tobacco is very bad for your health and your loved ones! You Should stop smoking!. 3)  Stop Smoking Tips: Choose a Quit date. Cut down before the Quit date. decide what you will do as a substitute when you feel the urge to smoke(gum,toothpick,exercise). 4)  It is important that you exercise  regularly at least 20 minutes 5 times a week. If you develop chest pain, have severe difficulty breathing, or feel very tired , stop exercising immediately and seek medical attention.  Prevention & Chronic Care Immunizations   Influenza vaccine: Not documented   Influenza vaccine deferral: Deferred  (12/01/2009)    Tetanus booster: Not documented   Td booster deferral: Deferred  (12/01/2009)    Pneumococcal vaccine: Not documented  Colorectal Screening   Hemoccult: Not documented   Hemoccult action/deferral: Deferred  (12/01/2009)    Colonoscopy: Not documented   Colonoscopy action/deferral: GI referral  (12/01/2009)  Other Screening   PSA: Not documented   Smoking status: quit < 6 months  (03/04/2010)  Diabetes Mellitus   HgbA1C: 6.6  (01/02/2010)    Eye exam: Not documented   Diabetic eye exam action/deferral: Ophthalmology referral  (03/04/2010)    Foot exam: yes  (03/04/2010)   Foot exam action/deferral: Do today   High risk foot: Not documented   Foot care education: Done  (03/04/2010)    Urine microalbumin/creatinine ratio: 79.7  (12/01/2009)   Urine microalbumin action/deferral: Ordered    Diabetes flowsheet reviewed?: Yes   Progress toward A1C goal: At goal  Lipids   Total Cholesterol: 84  (12/01/2009)   Lipid panel action/deferral: Lipid Panel ordered   LDL: 41   (12/01/2009)   LDL Direct: Not documented   HDL: 26  (12/01/2009)   Triglycerides: 83  (12/01/2009)    SGOT (AST): 18  (12/01/2009)   BMP action: Ordered   SGPT (ALT): 14  (12/01/2009)   Alkaline phosphatase: 43  (12/01/2009)   Total bilirubin: 0.3  (12/01/2009)    Lipid flowsheet reviewed?: Yes   Progress toward LDL goal: At goal  Hypertension   Last Blood Pressure: 133 / 86  (03/04/2010)   Serum creatinine: 1.17  (12/01/2009)   BMP action: Ordered   Serum potassium 4.2  (12/01/2009)    Hypertension flowsheet reviewed?: Yes   Progress toward BP goal: Unchanged  Self-Management Support :   Personal Goals (by the next clinic visit) :     Personal A1C goal: 7  (12/01/2009)     Personal blood pressure goal: 130/80  (12/01/2009)     Personal LDL goal: 100  (12/01/2009)    Patient will work on the following items until the next clinic visit to reach self-care goals:     Medications and monitoring: take my medicines every day, check my blood sugar, bring all of my medications to every visit  (03/04/2010)     Eating: drink diet soda or water instead of juice or soda, eat more vegetables, use fresh or frozen vegetables, eat foods that are low in salt, eat baked foods instead of fried foods, eat fruit for snacks and desserts, limit or avoid alcohol  (03/04/2010)     Activity: take a 30 minute walk every day  (03/04/2010)    Diabetes self-management support: Education handout, Resources for patients handout  (03/04/2010)   Diabetes education handout printed    Hypertension self-management support: Education handout, Resources for patients handout  (03/04/2010)   Hypertension education handout printed    Lipid self-management support: Education handout, Resources for patients handout  (03/04/2010)     Lipid education handout printed      Resource handout printed.   Nursing Instructions: Diabetic foot exam today Refer for screening diabetic eye exam (see order)

## 2010-09-15 NOTE — Assessment & Plan Note (Signed)
Summary: ACUTE-NOT FEELING WELL/STAGGERING/CFB(YANG)   Vital Signs:  Patient profile:   55 year old male Height:      70 inches (177.80 cm) Weight:      172.9 pounds (78.59 kg) BMI:     24.90 Temp:     97.9 degrees F (36.61 degrees C) oral Pulse rate:   67 / minute BP sitting:   152 / 96  (left arm)  Vitals Entered By: Stanton Kidney Ditzler RN (November 06, 2009 1:07 PM) Is Patient Diabetic? Yes Did you bring your meter with you today? No Pain Assessment Patient in pain? yes     Location: right arm and right leg Intensity: 10 Type: numbness Onset of pain  this AM Nutritional Status BMI of 19 -24 = normal Nutritional Status Detail appetite good CBG Result 117  Have you ever been in a relationship where you felt threatened, hurt or afraid?denies   Does patient need assistance? Functional Status Self care Ambulation Normal Comments Sister with pt. Walked into clinic - out of meds today.   Primary Care Provider:  Jackson Latino MD   History of Present Illness: 55 year old man with pmh of DM, HTN, hyperlipidemia and tobacco abuse is here today for a new onset numbness on the right side of his body. Started at 10 am this morning and was associated with imbalance while he was trying to get up. No slurring of speech, weakness of face, difficulty in closing eyes noted. No h/o prior strokes/MI. Patient has not been taking any of his meds for a quite some time time. His CBG done in the clinic was 116.   Depression History:      The patient denies a depressed mood most of the day and a diminished interest in his usual daily activities.         Preventive Screening-Counseling & Management  Alcohol-Tobacco     Smoking Status: current     Packs/Day: 1 ppweek     Year Started: 20 years  Caffeine-Diet-Exercise     Does Patient Exercise: yes  Problems Prior to Update: 1)  Diabetes Mellitus, Type II  (ICD-250.00) 2)  Tobacco Abuse  (ICD-305.1) 3)  Dyslipidemia  (ICD-272.4) 4)   Diabetes-type 2  (ICD-250.00) 5)  Dm  (ICD-250.00) 6)  Candidiasis, Oral, Hx of  (ICD-V12.09)  Medications Prior to Update: 1)  Metformin Hcl 500 Mg Tabs (Metformin Hcl) .... Take Two Tablets By Mouth in The Morning, and One Tablet in The Evening 2)  Adult Aspirin Ec Low Strength 81 Mg Tbec (Aspirin) .... Take One Pill By Mouth Daily 3)  Niaspan 500 Mg Cr-Tabs (Niacin (Antihyperlipidemic)) .... Take One Pill By Mouth Daily.  Current Medications (verified): 1)  Metformin Hcl 500 Mg Tabs (Metformin Hcl) .... Take Two Tablets By Mouth in The Morning, and One Tablet in The Evening 2)  Adult Aspirin Ec Low Strength 81 Mg Tbec (Aspirin) .... Take One Pill By Mouth Daily 3)  Niaspan 500 Mg Cr-Tabs (Niacin (Antihyperlipidemic)) .... Take One Pill By Mouth Daily.  Allergies (verified): No Known Drug Allergies  Past History:  Past Medical History: Last updated: 04/08/2008 Diabetes mellitus, type II  Family History: Last updated: 04/08/2008 Family History of CAD Male 1st degree relative <60 Family History of CAD Male 1st degree relative <50 Family History Diabetes 1st degree relative Family History Hypertension  Social History: Last updated: 04/08/2008 Single Current Smoker Alcohol use-no Drug use-no Regular exercise-yes  Risk Factors: Exercise: yes (11/06/2009)  Risk Factors: Smoking Status: current (  11/06/2009) Packs/Day: 1 ppweek (11/06/2009)  Family History: Reviewed history from 04/08/2008 and no changes required. Family History of CAD Male 1st degree relative <60 Family History of CAD Male 1st degree relative <50 Family History Diabetes 1st degree relative Family History Hypertension  Social History: Reviewed history from 04/08/2008 and no changes required. Single Current Smoker Alcohol use-no Drug use-no Regular exercise-yes Packs/Day:  1 ppweek  Review of Systems      See HPI  Physical Exam  Additional Exam:  Gen: AOx3, in no acute distress Eyes:  PERRL, EOMI ENT:MMM, No erythema noted in posterior pharynx Neck: No JVD, No LAP Chest: CTAB with  good respiratory effort CVS: regular rhythmic rate, NO M/R/G, S1 S2 normal Abdo: soft,ND, BS+x4, Non tender and No hepatosplenomegaly EXT: No odema noted Neuro: CN II to XII intact, unable to walk and falling on his right side, reflexes are normal in both upper and loer extremities, no motor weakness noted with strength 5/5, sensory loss noted over right upper and lower extremities. Coordination was impaired on the right side with negative dysdiadocokinesia. Skin: no rashes noted.    Impression & Recommendations:  Problem # 1:  GAIT IMBALANCE (ICD-781.2) Assessment New Patient was taken to the CT room and a Code stroke was called for new onset numbness and gait imbalance wstarted 4 hours ago. I talked to the ED physician Dr Fredricka Bonine and Dr Radford Pax about the patient while he was undergoing CT head. The furher management was upto the ED docs.  Complete Medication List: 1)  Metformin Hcl 500 Mg Tabs (Metformin hcl) .... Take two tablets by mouth in the morning, and one tablet in the evening 2)  Adult Aspirin Ec Low Strength 81 Mg Tbec (Aspirin) .... Take one pill by mouth daily 3)  Niaspan 500 Mg Cr-tabs (Niacin (antihyperlipidemic)) .... Take one pill by mouth daily.  Other Orders: Capillary Blood Glucose/CBG [id number]) Process Orders Tests Sent for requisitioning (November 06, 2009 3:44 PM):     11/06/2009: [facility] -- T-Comprehensive Metabolic Panel [phone] (unsigned)   Prevention & Chronic Care Immunizations   Influenza vaccine: Not documented    Tetanus booster: Not documented    Pneumococcal vaccine: Not documented  Colorectal Screening   Hemoccult: Not documented    Colonoscopy: Not documented  Other Screening   PSA: Not documented   Smoking status: current  (11/06/2009)  Diabetes Mellitus   HgbA1C: 7.4  (09/10/2008)    Eye exam: Not documented     Foot exam: Not documented   High risk foot: Not documented   Foot care education: Not documented    Urine microalbumin/creatinine ratio: 10.4  (04/08/2008)  Lipids   Total Cholesterol: 133  (09/11/2008)   LDL: 89  (09/11/2008)   LDL Direct: Not documented   HDL: 34  (09/11/2008)   Triglycerides: 50  (09/11/2008)    SGOT (AST): 20  (09/11/2008)   SGPT (ALT): 11  (09/11/2008)   Alkaline phosphatase: 45  (09/11/2008)   Total bilirubin: 0.4  (09/11/2008)  Self-Management Support :    Patient will work on the following items until the next clinic visit to reach self-care goals:     Medications and monitoring: take my medicines every day, bring all of my medications to every visit  (11/06/2009)     Eating: drink diet soda or water instead of juice or soda, eat more vegetables, use fresh or frozen vegetables, eat foods that are low in salt, eat fruit for snacks and desserts, limit or avoid alcohol  (  11/06/2009)     Activity: take a 30 minute walk every day  (11/06/2009)    Diabetes self-management support: CBG self-monitoring log, Written self-care plan, Education handout, Resources for patients handout  (11/06/2009)   Diabetes care plan printed   Diabetes education handout printed    Lipid self-management support: Written self-care plan, Education handout, Resources for patients handout  (11/06/2009)   Lipid self-care plan printed.   Lipid education handout printed      Resource handout printed.

## 2010-09-15 NOTE — Letter (Signed)
Summary: [facility]  [facility]   Imported By: Margie Billet 11/27/2009 11:48:47  _____________________________________________________________________  External Attachment:    Type:   Image     Comment:   External Document

## 2010-09-15 NOTE — Assessment & Plan Note (Signed)
Summary: ACUTE-BP 150/90 WANTS TO BE CHECKED(YANG)/CFB   Vital Signs:  Patient profile:   55 year old male Height:      70 inches (177.80 cm) Weight:      172.4 pounds (78.36 kg) BMI:     24.83 Temp:     97.7 degrees F (36.50 degrees C) oral Pulse rate:   70 / minute BP sitting:   135 / 88  (left arm) Cuff size:   regular  Vitals Entered By: Cynda Familia Duncan Dull) (December 01, 2009 1:27 PM) Is Patient Diabetic? Yes  Does patient need assistance? Functional Status Self care   Primary Care Provider:  Jackson Latino MD   History of Present Illness: 55 yo m with Past Medical History:  Diabetes mellitus, type II (6.7) HTN HLD hx of stroke 10/2009  hx tobacco abuse (quit 11/2009)  Presents to [facility] for f/u after d/c from hospital where patient was admitted for stroke. Patient has no new complaints today, and here for f/u of medical problems,  Patient currently denies SOB, Denies CP, Denies fever, chills, nausea, vomiting, diarrhea, constipation and otherwise doing well and denies any other complaints.        Preventive Screening-Counseling & Management  Alcohol-Tobacco     Smoking Status: quit < 6 months     Packs/Day: 1 ppweek     Year Started: 20 years     Year Quit: 10/2009  Current Medications (verified): 1)  Metformin Hcl 500 Mg Tabs (Metformin Hcl) .... Take Two Tablets By Mouth in The Morning, and One Tablet in The Evening 2)  Ecotrin 325 Mg Tbec (Aspirin) .... Take 1 Tablet By Mouth Once A Day 3)  Crestor 20 Mg Tabs (Rosuvastatin Calcium) .... Take 1 Tablet By Mouth Once A Day 4)  Lisinopril-Hydrochlorothiazide 20-12.5 Mg Tabs (Lisinopril-Hydrochlorothiazide) .... Take 1 Tablet By Mouth Once A Day  Allergies (verified): No Known Drug Allergies  Social History: Smoking Status:  quit < 6 months  Review of Systems       Per HPI  Physical Exam  General:  alert, well-developed, and cooperative to examination.    Mouth:  MMM Neck:  supple, full ROM, no  thyromegaly, no JVD, and no carotid bruits.    Lungs:  normal respiratory effort, no accessory muscle use, normal breath sounds, no crackles, and no wheezes.  Heart:  normal rate, regular rhythm, no murmur, no gallop, and no rub.    Abdomen:  soft, non-tender, normal bowel sounds, no distention, no guarding, no rebound tenderness, no hepatomegaly, and no splenomegaly.    Msk:  no joint swelling, no joint warmth, and no redness over joints.    Extremities:  No cyanosis, clubbing, edema  Neurologic:  alert & oriented X3, cranial nerves II-XII intact, strength normal in all extremities, sensation intact to light touch, and gait normal.     Skin:   turgor normal and no rashes.   Psych:  Oriented X3, memory intact for recent and remote, normally interactive, good eye contact, not anxious appearing, and not depressed appearing.    Impression & Recommendations:  Problem # 1:  DIABETES MELLITUS, TYPE II (ICD-250.00) Assessment Comment Only a1c 6.7 today, Well controlled on current treatment, No new changes made today, Will continue to monitor.   His updated medication list for this problem includes:    Metformin Hcl 500 Mg Tabs (Metformin hcl) .Marland Kitchen... Take two tablets by mouth in the morning, and one tablet in the evening    Ecotrin 325 Mg Tbec (Aspirin) .Marland KitchenMarland KitchenMarland KitchenMarland Kitchen  Take 1 tablet by mouth once a day    Lisinopril-hydrochlorothiazide 20-12.5 Mg Tabs (Lisinopril-hydrochlorothiazide) .Marland Kitchen... Take 1 tablet by mouth once a day  Labs Reviewed: Creat: 1.30 (09/11/2008)    Reviewed HgBA1c results: 7.4 (09/10/2008)  Problem # 2:  DYSLIPIDEMIA (ICD-272.4) Assessment: Comment Only will recheck FLP, and reduce crestor to 20 from 40, as the patient had LDL 89 on 10 of crestor prior to his stroke.  therefore a dose of 40 would drop ldl well below 60, which is not advisable.  His updated medication list for this problem includes:    Crestor 20 Mg Tabs (Rosuvastatin calcium) .Marland Kitchen... Take 1 tablet by mouth once a  day  Orders: T-Lipid Profile ([id number])  Problem # 3:  TOBACCO ABUSE (ICD-305.1) Assessment: Comment Only Patient was counseled on smoking cessation strategies including medications and behavior modification options. Patient said she was ready to stop smoking at this time, and is currently trying to quit using nicotine patches.    Problem # 4:  HYPERTENSION, MILD (ICD-401.1) Assessment: New  will change lisinopril 10 to lisinopril-hctz 20-12.5, and monitor in one month dose.  Will also check TSH and FT4 to r/o other causes.   His updated medication list for this problem includes:    Lisinopril-hydrochlorothiazide 20-12.5 Mg Tabs (Lisinopril-hydrochlorothiazide) .Marland Kitchen... Take 1 tablet by mouth once a day  Orders: T-T4, Free [phone]) T-TSH ([phone])  BP today: 135/88 Prior BP: 152/96 (11/06/2009)  Labs Reviewed: K+: 4.6 (09/11/2008) Creat: : 1.30 (09/11/2008)   Chol: 133 (09/11/2008)   HDL: 34 (09/11/2008)   LDL: 89 (09/11/2008)   TG: 50 (09/11/2008)  Problem # 5:  CEREBROVASCULAR ACCIDENT, HX OF (ICD-V12.50) Assessment: New had stroke 10/2009, and now has appointment with neurologist, and has [facility] for PT/OT Patient has no focal deficits from his stroke.   Problem # 6:  SPECIAL SCREENING FOR MALIGNANT NEOPLASMS COLON (ICD-V76.51) will make referral to GI for screening colonoscopy.   Orders: Gastroenterology Referral (GI)  Complete Medication List: 1)  Metformin Hcl 500 Mg Tabs (Metformin hcl) .... Take two tablets by mouth in the morning, and one tablet in the evening 2)  Ecotrin 325 Mg Tbec (Aspirin) .... Take 1 tablet by mouth once a day 3)  Crestor 20 Mg Tabs (Rosuvastatin calcium) .... Take 1 tablet by mouth once a day 4)  Lisinopril-hydrochlorothiazide 20-12.5 Mg Tabs (Lisinopril-hydrochlorothiazide) .... Take 1 tablet by mouth once a day  Other Orders: T-Urine Microalbumin w/creat. ratio [phone]) T-Comprehensive Metabolic Panel  ([id number])  Patient Instructions: 1)  Please schedule a follow-up appointment in 1 month. Prescriptions: LISINOPRIL-HYDROCHLOROTHIAZIDE 20-12.5 MG TABS (LISINOPRIL-HYDROCHLOROTHIAZIDE) Take 1 tablet by mouth once a day  #30 x 0   Entered and Authorized by:   Darnelle Maffucci MD   Signed by:   Darnelle Maffucci MD on 12/01/2009   Method used:   Print then Give to Patient   RxID:   [id number] CRESTOR 20 MG TABS (ROSUVASTATIN CALCIUM) Take 1 tablet by mouth once a day  #30 x 3   Entered and Authorized by:   Darnelle Maffucci MD   Signed by:   Darnelle Maffucci MD on 12/01/2009   Method used:   Print then Give to Patient   RxID:   [id number] PRINZIDE 20-12.5 MG TABS (LISINOPRIL-HYDROCHLOROTHIAZIDE) Take 1 tablet by mouth once a day  #30 x 0   Entered and Authorized by:   Darnelle Maffucci MD   Signed by:   Darnelle Maffucci MD on 12/01/2009   Method used:   Print then  Give to Patient   RxID:   [id number]   Prevention & Chronic Care Immunizations   Influenza vaccine: Not documented   Influenza vaccine deferral: Deferred  (12/01/2009)    Tetanus booster: Not documented   Td booster deferral: Deferred  (12/01/2009)    Pneumococcal vaccine: Not documented  Colorectal Screening   Hemoccult: Not documented   Hemoccult action/deferral: Deferred  (12/01/2009)    Colonoscopy: Not documented   Colonoscopy action/deferral: GI referral  (12/01/2009)  Other Screening   PSA: Not documented   Smoking status: quit < 6 months  (12/01/2009)  Diabetes Mellitus   HgbA1C: 7.4  (09/10/2008)    Eye exam: Not documented    Foot exam: Not documented   High risk foot: Not documented   Foot care education: Not documented    Urine microalbumin/creatinine ratio: 10.4  (04/08/2008)   Urine microalbumin action/deferral: Ordered    Diabetes flowsheet reviewed?: Yes   Progress toward A1C goal: Unchanged  Lipids   Total Cholesterol: 133  (09/11/2008)   Lipid panel action/deferral: Lipid  Panel ordered   LDL: 89  (09/11/2008)   LDL Direct: Not documented   HDL: 34  (09/11/2008)   Triglycerides: 50  (09/11/2008)    SGOT (AST): 20  (09/11/2008)   BMP action: Ordered   SGPT (ALT): 11  (09/11/2008) CMP ordered    Alkaline phosphatase: 45  (09/11/2008)   Total bilirubin: 0.4  (09/11/2008)    Lipid flowsheet reviewed?: Yes   Progress toward LDL goal: At goal  Hypertension   Last Blood Pressure: 135 / 88  (12/01/2009)   Serum creatinine: 1.30  (09/11/2008)   BMP action: Ordered   Serum potassium 4.6  (09/11/2008) CMP ordered   Self-Management Support :   Personal Goals (by the next clinic visit) :     Personal A1C goal: 7  (12/01/2009)     Personal blood pressure goal: 130/80  (12/01/2009)     Personal LDL goal: 100  (12/01/2009)    Patient will work on the following items until the next clinic visit to reach self-care goals:     Medications and monitoring: take my medicines every day  (12/01/2009)     Eating: eat foods that are low in salt, eat baked foods instead of fried foods  (12/01/2009)     Activity: join a walking program  (12/01/2009)    Diabetes self-management support: Pre-printed educational material, Resources for patients handout, Written self-care plan  (12/01/2009)   Diabetes care plan printed    Hypertension self-management support: Pre-printed educational material, Resources for patients handout, Written self-care plan  (12/01/2009)   Hypertension self-care plan printed.    Lipid self-management support: Pre-printed educational material, Resources for patients handout, Written self-care plan  (12/01/2009)   Lipid self-care plan printed.      Resource handout printed.   Nursing Instructions: GI referral for screening colonoscopy (see order)    Process Orders Check Orders Results:     [facility]: Check successful Tests Sent for requisitioning (December 01, 2009 4:08 PM):     12/01/2009: [facility] --  T-Urine Microalbumin w/creat. ratio [[id number]] (signed)     12/01/2009: [facility] -- T-Lipid Profile [phone] (signed)     12/01/2009: [facility] -- T-Comprehensive Metabolic Panel [[id number]] (signed)     12/01/2009: [facility] -- [place], [place] [[id number]] (signed)     12/01/2009: [facility] -- T-TSH [phone] (signed)

## 2010-09-15 NOTE — Assessment & Plan Note (Signed)
Summary: [name] Williams/1 MONTH RECHECK FOR BP/CH   Vital Signs:  Patient profile:   55 year old male Height:      70 inches Weight:      166.4 pounds BMI:     23.96 Temp:     97.8 degrees F oral Pulse rate:   95 / minute BP sitting:   105 / 73  (right arm)  Vitals Entered By: Filomena Jungling NT II (Jan 02, 2010 9:51 AM) CC: check-up Is Patient Diabetic? Yes Did you bring your meter with you today? No Pain Assessment Patient in pain? no      Nutritional Status BMI of 19 -24 = normal CBG Result 261  Have you ever been in a relationship where you felt threatened, hurt or afraid?No   Does patient need assistance? Functional Status Self care Ambulation Normal   Primary Care Provider:  Jackson Latino MD  CC:  check-up.  History of Present Illness: 55 yo m with Past Medical History comes to the office for a follow up on his HTN.  1. HTN: Patient was seen last in the clinic for a hospital follow up and his BP medication regimen was changed  and was advised to follow up. He reports that he has been taking his medication as was instructed and reports that he feels "DIZZY EVERY NOW AND THEN" when he stands up. His sister reports that she checks his BP's and reports that they are slightly on the soft side, around 120's.   2. DM: Patients sister checks his blood sugars occasionally, like once a week and they are usually in 200's range.  He denies any other complaints.        Preventive Screening-Counseling & Management  Alcohol-Tobacco     Smoking Status: quit < 6 months     Packs/Day: 1 ppweek     Year Started: 20 years     Year Quit: 10/2009  Caffeine-Diet-Exercise     Does Patient Exercise: yes  Problems Prior to Update: 1)  Cerebrovascular Accident, Hx of  (ICD-V12.50) 2)  Hypertension, Mild  (ICD-401.1) 3)  Gait Imbalance  (ICD-781.2) 4)  Diabetes Mellitus, Type II  (ICD-250.00) 5)  Tobacco Abuse  (ICD-305.1) 6)  Dyslipidemia  (ICD-272.4) 7)  Diabetes-type 2   (ICD-250.00) 8)  Dm  (ICD-250.00) 9)  Candidiasis, Oral, Hx of  (ICD-V12.09)  Medications Prior to Update: 1)  Metformin Hcl 500 Mg Tabs (Metformin Hcl) .... Take Two Tablets By Mouth in The Morning, and One Tablet in The Evening 2)  Ecotrin 325 Mg Tbec (Aspirin) .... Take 1 Tablet By Mouth Once A Day 3)  Crestor 20 Mg Tabs (Rosuvastatin Calcium) .... Take 1 Tablet By Mouth Once A Day 4)  Lisinopril-Hydrochlorothiazide 20-12.5 Mg Tabs (Lisinopril-Hydrochlorothiazide) .... Take 1 Tablet By Mouth Once A Day  Current Medications (verified): 1)  Metformin Hcl 500 Mg Tabs (Metformin Hcl) .... Take Two Tablets By Mouth in The Morning, and One Tablet in The Evening 2)  Ecotrin 325 Mg Tbec (Aspirin) .... Take 1 Tablet By Mouth Once A Day 3)  Crestor 20 Mg Tabs (Rosuvastatin Calcium) .... Take 1 Tablet By Mouth Once A Day 4)  Lisinopril-Hydrochlorothiazide 20-12.5 Mg Tabs (Lisinopril-Hydrochlorothiazide) .... Take 1 Tablet By Mouth Once A Day  Allergies (verified): No Known Drug Allergies  Past History:  Family History: Last updated: 04/08/2008 Family History of CAD Male 1st degree relative <60 Family History of CAD Male 1st degree relative <50 Family History Diabetes 1st degree relative Family History Hypertension  Social History: Last updated: 04/08/2008 Single Current Smoker Alcohol use-no Drug use-no Regular exercise-yes  Risk Factors: Exercise: yes (01/02/2010)  Risk Factors: Smoking Status: quit < 6 months (01/02/2010) Packs/Day: 1 ppweek (01/02/2010)  Past Medical History: Reviewed history from 04/08/2008 and no changes required. Diabetes mellitus, type II  Family History: Reviewed history from 04/08/2008 and no changes required. Family History of CAD Male 1st degree relative <60 Family History of CAD Male 1st degree relative <50 Family History Diabetes 1st degree relative Family History Hypertension  Social History: Reviewed history from 04/08/2008 and no  changes required. Single Current Smoker Alcohol use-no Drug use-no Regular exercise-yes  Review of Systems      See HPI  Physical Exam  General:  alert, well-developed, and well-nourished.   Head:  normocephalic.   Neck:  supple and full ROM.   Lungs:  normal respiratory effort, no intercostal retractions, no accessory muscle use, and normal breath sounds.   Heart:  normal rate and regular rhythm.   Abdomen:  soft and normal bowel sounds.   Msk:  normal ROM.   Pulses:  R radial normal.   Extremities:  No cyanosis, clubbing, edema  Neurologic:  alert & oriented X3.  Motor strength is 5/5 bilaterally but sensation decreased to light touch on the right side compared to the left side. Gait is slightly limping type gait, with slight preference to the left side.   Impression & Recommendations:  Problem # 1:  DIABETES MELLITUS, TYPE II (ICD-250.00) Despite the HbA1C is well controlled, his sister reports pre-prandial sugars/ fasting sugars in 240-270's range. Will increase the metformin to maximal dose today and will follow up in a month. If the fasting blood sugars are more than 150, recommended to call the clinic. Suspect he needs additional SFU. Recommended to check his CBG's 1-2 times daily and bring the log to his next office visit.  The following medications were removed from the medication list:    Lisinopril-hydrochlorothiazide 20-12.5 Mg Tabs (Lisinopril-hydrochlorothiazide) .Marland Kitchen... Take 1 tablet by mouth once a day His updated medication list for this problem includes:    Metformin Hcl 500 Mg Tabs (Metformin hcl) .Marland Kitchen... Take two tablets by mouth in the morning, and two tablets in the evening    Ecotrin 325 Mg Tbec (Aspirin) .Marland Kitchen... Take 1 tablet by mouth once a day    Lisinopril 20 Mg Tabs (Lisinopril) .Marland Kitchen... Take 1 tablet by mouth once a day  Orders: T- Capillary Blood Glucose (16109) T-Hgb A1C (in-house) ([id number])  Labs Reviewed: Creat: 1.17 (12/01/2009)    Reviewed HgBA1c  results: 6.6 (01/02/2010)  7.4 (09/10/2008)  Problem # 2:  HYPERTENSION, MILD (ICD-401.1) BP slightly on the soft side and patient also complains of Dizziness when he stands up. Given his microalbuminurea, will continue the current strength of ACE-I but will stop the HCTZ component. Will follow up at his next office visit.  The following medications were removed from the medication list:    Lisinopril-hydrochlorothiazide 20-12.5 Mg Tabs (Lisinopril-hydrochlorothiazide) .Marland Kitchen... Take 1 tablet by mouth once a day His updated medication list for this problem includes:    Lisinopril 20 Mg Tabs (Lisinopril) .Marland Kitchen... Take 1 tablet by mouth once a day  BP today: 105/73 Prior BP: 135/88 (12/01/2009)  Labs Reviewed: K+: 4.2 (12/01/2009) Creat: : 1.17 (12/01/2009)   Chol: 84 (12/01/2009)   HDL: 26 (12/01/2009)   LDL: 41 (12/01/2009)   TG: 83 (12/01/2009)  Problem # 3:  CEREBROVASCULAR ACCIDENT, HX OF (ICD-V12.50) Persistent decreased sensations on the right  side compared to the left side even though the motor strength os 5/5 bilaterally. Recommended to follow up with Dr. Arlean Hopping as advised.  Problem # 4:  DYSLIPIDEMIA (ICD-272.4)  LDL greatly controlled and crestor dose has been adjusted recently.  His updated medication list for this problem includes:    Crestor 20 Mg Tabs (Rosuvastatin calcium) .Marland Kitchen... Take 1 tablet by mouth once a day  Labs Reviewed: SGOT: 18 (12/01/2009)   SGPT: 14 (12/01/2009)   HDL:26 (12/01/2009), 34 (09/11/2008)  LDL:41 (12/01/2009), 89 (09/11/2008)  Chol:84 (12/01/2009), 133 (09/11/2008)  Trig:83 (12/01/2009), 50 (09/11/2008)  Complete Medication List: 1)  Metformin Hcl 500 Mg Tabs (Metformin hcl) .... Take two tablets by mouth in the morning, and two tablets in the evening 2)  Ecotrin 325 Mg Tbec (Aspirin) .... Take 1 tablet by mouth once a day 3)  Crestor 20 Mg Tabs (Rosuvastatin calcium) .... Take 1 tablet by mouth once a day 4)  Lisinopril 20 Mg Tabs (Lisinopril) ....  Take 1 tablet by mouth once a day  Patient Instructions: 1)  Please schedule a follow-up appointment in 1 month. 2)  Check your blood sugars regularly. If your readings are usually above : 300 or below 70 you should contact our office. Prescriptions: LISINOPRIL 20 MG TABS (LISINOPRIL) Take 1 tablet by mouth once a day  #30 x 5   Entered and Authorized by:   Blondell Reveal MD   Signed by:   Blondell Reveal MD on 01/02/2010   Method used:   Electronically to        [facility] [id number] (retail)       [address]       [place], [place]  [address]       Ph: [phone]       Fax: [phone]   RxID:   [id number]   Laboratory Results   Blood Tests   Date/Time Received: Jan 02, 2010 9:59 AM Date/Time Reported: Alric Quan  Jan 02, 2010 9:59 AM   HGBA1C: 6.6%   (Normal Range: Non-Diabetic - 3-6%   Control Diabetic - 6-8%) CBG Random:: 261mg /dL

## 2010-09-15 NOTE — Progress Notes (Signed)
Summary: med refill/gp  Phone Note Refill Request Message from:  Fax from Pharmacy on July 01, 2010 11:51 AM  Refills Requested: Medication #1:  LISINOPRIL 20 MG TABS Take 1 tablet by mouth once a day.   Dosage confirmed as above?Dosage Confirmed   Last Refilled: 05/28/2010 Last appt.  03/12/10.   Method Requested: Electronic Initial call taken by: Chinita Pester RN,  July 01, 2010 11:51 AM  Follow-up for Phone Call        Rx completed in Dr. Tiajuana Amass Follow-up by: Jackson Latino MD,  July 02, 2010 8:42 AM    Prescriptions: LISINOPRIL 20 MG TABS (LISINOPRIL) Take 1 tablet by mouth once a day  #30 x 11   Entered and Authorized by:   Jackson Latino MD   Signed by:   Jackson Latino MD on 07/02/2010   Method used:   Electronically to        [facility] [id number] (retail)       [address] [place], [place]  [address]       Ph: [phone]       Fax: [phone]   RxID:   [id number]

## 2010-10-06 ENCOUNTER — Other Ambulatory Visit: Payer: Self-pay | Admitting: Internal Medicine

## 2010-10-10 IMAGING — CR DG ABDOMEN ACUTE W/ 1V CHEST
3 series · 3 of 3 positions shown · non-contrast
Comparison: Chest and abdomen films of 03/11/2008

CLINICAL DATA: Mid abdominal pain, nausea, vomiting

ACUTE ABDOMEN SERIES (ABDOMEN 2 VIEW & CHEST 1 VIEW)

[w chest pa]
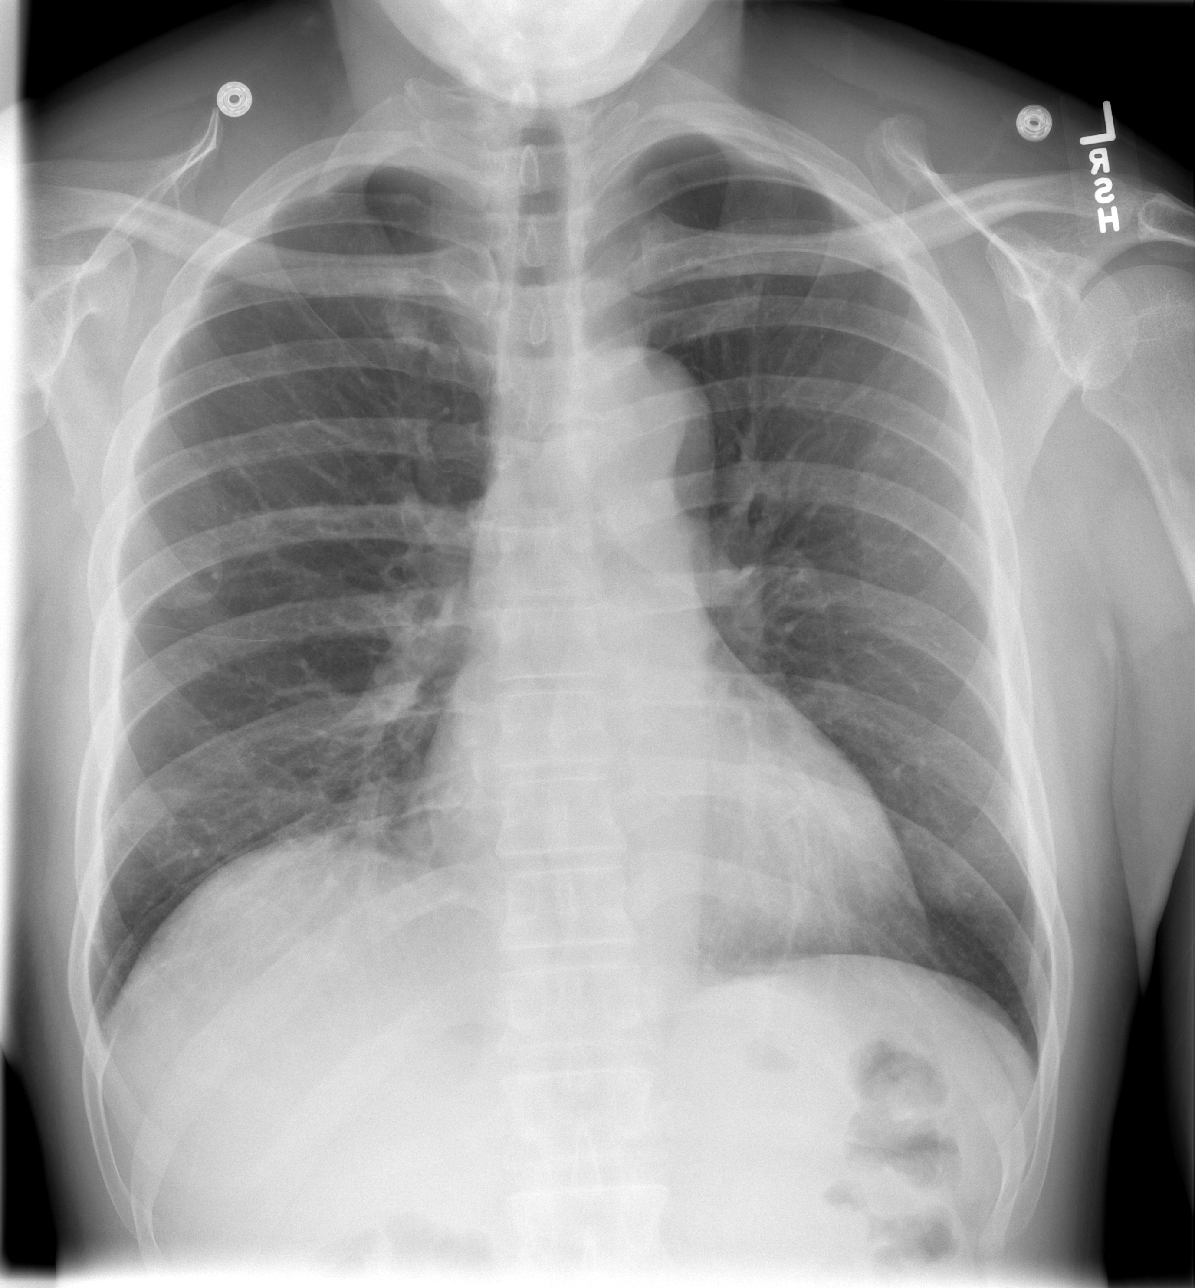

[w abdomen upright]
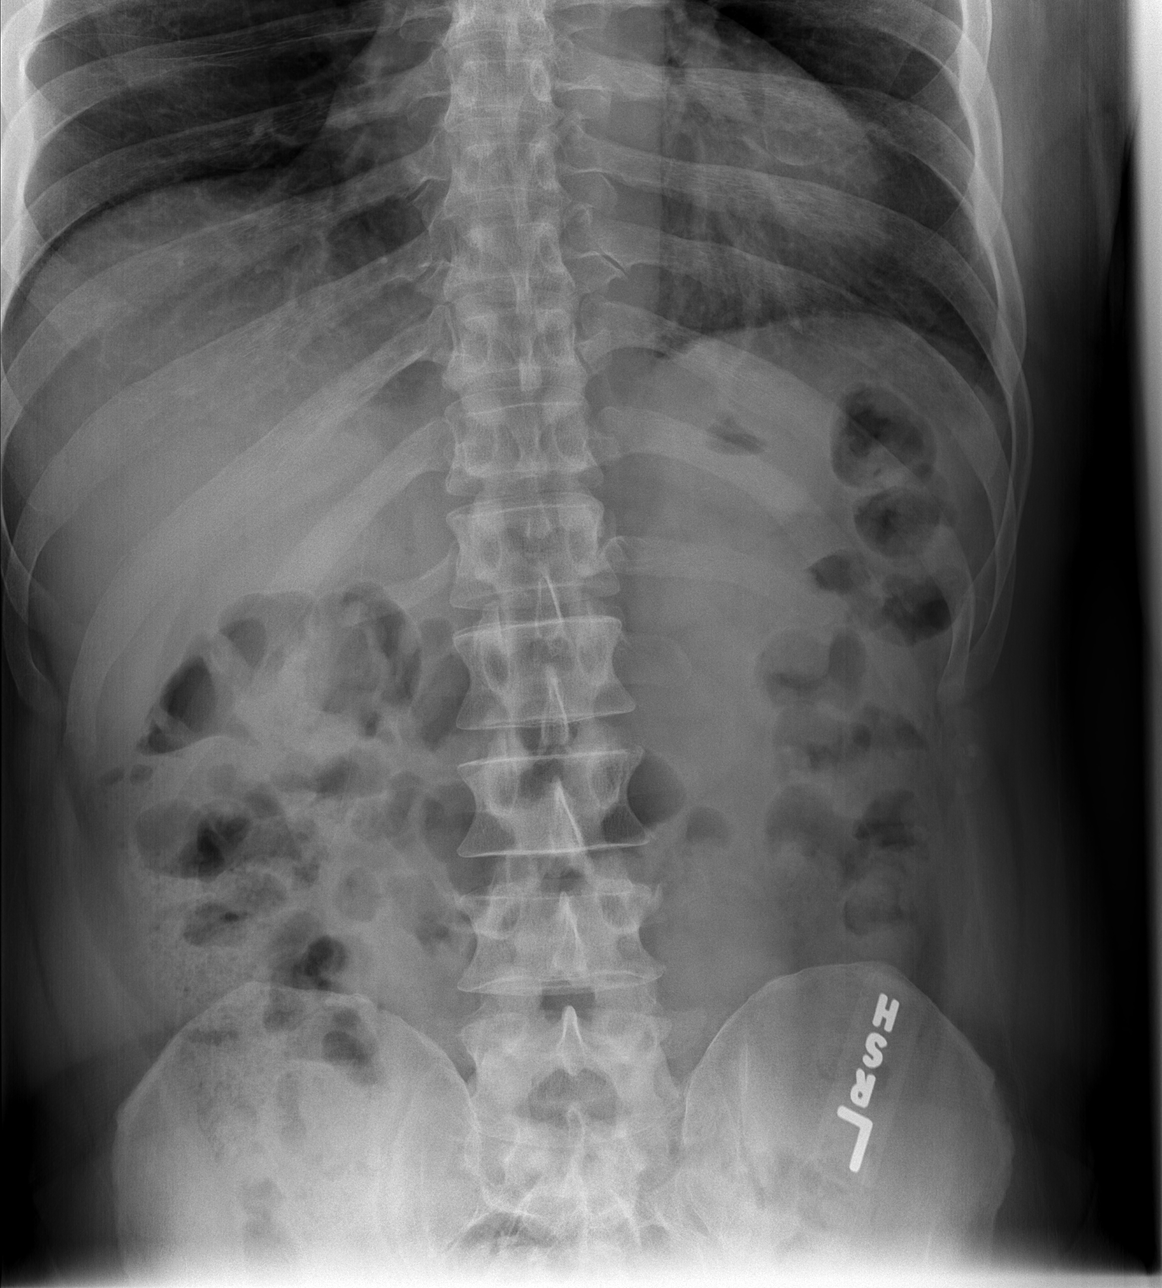

[t abdomen supine]
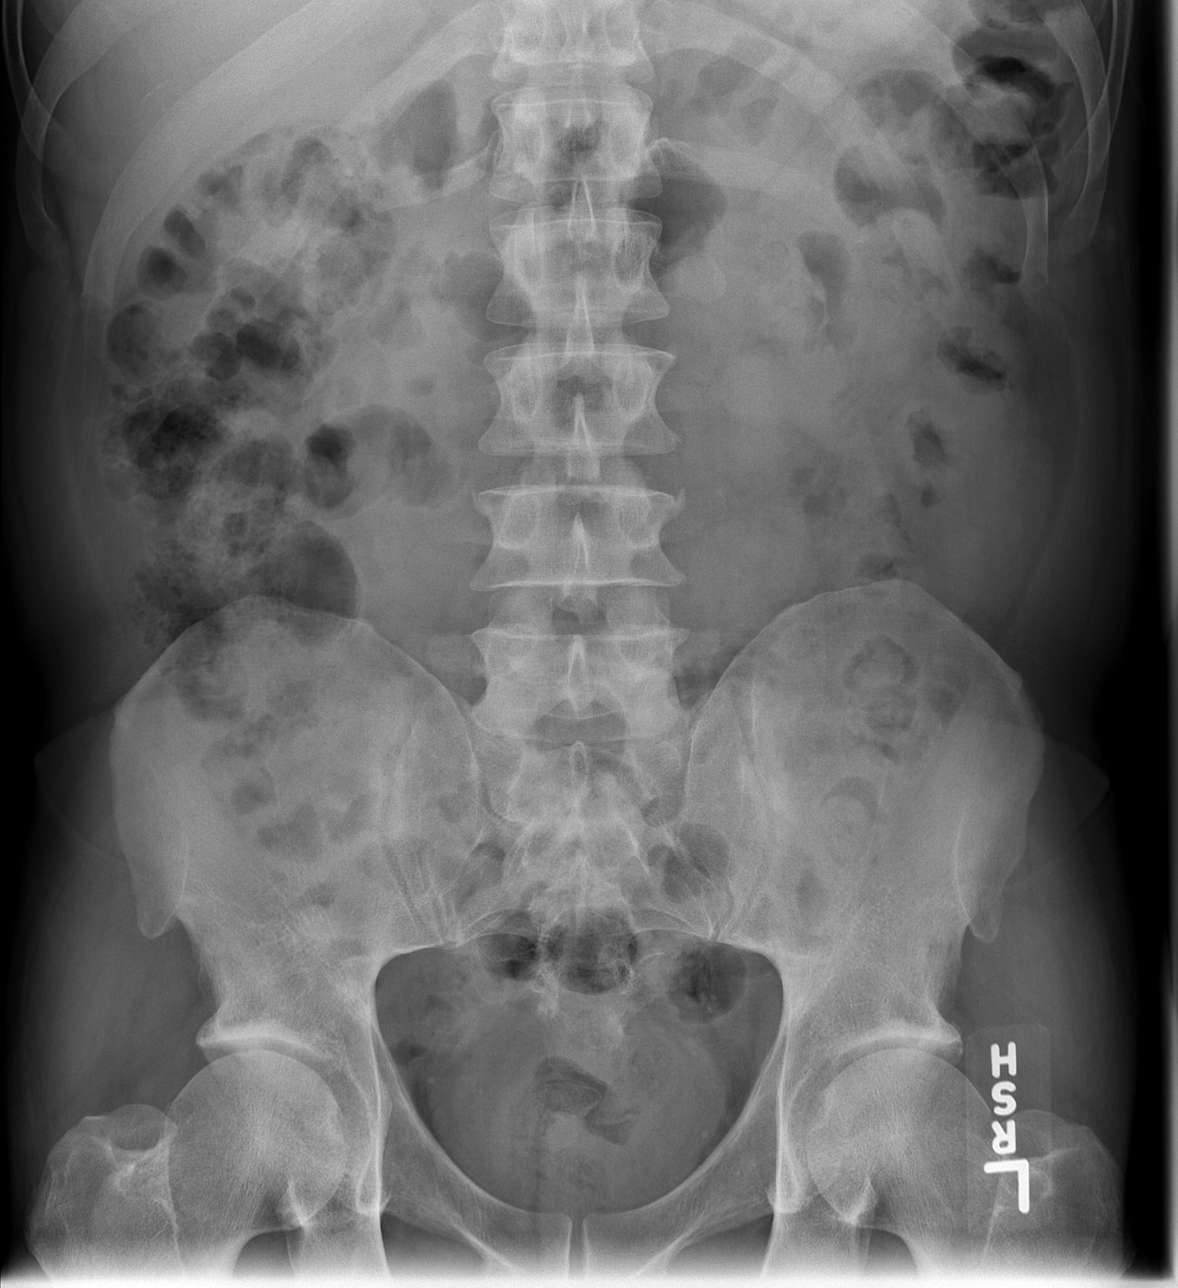

[3 of 3 positions shown; findings below may reference images not displayed]

FINDINGS: The lungs are clear.  Heart is within normal limits in
size.  No bony abnormality is seen.

Supine and erect views the abdomen show a moderate amount of feces
throughout the colon.  No obstruction or free air is seen.  No
opaque calculi are noted.
IMPRESSION: .
1.  No active lung disease.
2.  Moderate amount of feces throughout the colon.  No obstruction
or free air.

## 2010-11-01 LAB — GLUCOSE, CAPILLARY: Glucose-Capillary: 158 mg/dL — ABNORMAL HIGH (ref 70–99)

## 2010-11-02 LAB — GLUCOSE, CAPILLARY: Glucose-Capillary: 261 mg/dL — ABNORMAL HIGH (ref 70–99)

## 2010-11-04 LAB — DIFFERENTIAL
Basophils Relative: 0 % (ref 0–1)
Lymphocytes Relative: 31 % (ref 12–46)
Lymphs Abs: 2.6 10*3/uL (ref 0.7–4.0)

## 2010-11-04 LAB — COMPREHENSIVE METABOLIC PANEL
AST: 31 U/L (ref 0–37)
Alkaline Phosphatase: 41 U/L (ref 39–117)
BUN: 15 mg/dL (ref 6–23)
CO2: 27 mEq/L (ref 19–32)
Calcium: 9 mg/dL (ref 8.4–10.5)
Chloride: 104 mEq/L (ref 96–112)
GFR calc Af Amer: >60 mL/min (ref >60)
Glucose, Bld: 134 mg/dL — ABNORMAL HIGH (ref 70–99)
Potassium: 4.3 mEq/L (ref 3.5–5.1)
Total Bilirubin: 0.3 mg/dL (ref 0.3–1.2)
Total Protein: 6.8 g/dL (ref 6.0–8.3)

## 2010-11-04 LAB — GLUCOSE, CAPILLARY
Glucose-Capillary: 101 mg/dL — ABNORMAL HIGH (ref 70–99)
Glucose-Capillary: 105 mg/dL — ABNORMAL HIGH (ref 70–99)
Glucose-Capillary: 116 mg/dL — ABNORMAL HIGH (ref 70–99)
Glucose-Capillary: 117 mg/dL — ABNORMAL HIGH (ref 70–99)
Glucose-Capillary: 117 mg/dL — ABNORMAL HIGH (ref 70–99)
Glucose-Capillary: 124 mg/dL — ABNORMAL HIGH (ref 70–99)
Glucose-Capillary: 126 mg/dL — ABNORMAL HIGH (ref 70–99)
Glucose-Capillary: 131 mg/dL — ABNORMAL HIGH (ref 70–99)
Glucose-Capillary: 136 mg/dL — ABNORMAL HIGH (ref 70–99)
Glucose-Capillary: 137 mg/dL — ABNORMAL HIGH (ref 70–99)
Glucose-Capillary: 137 mg/dL — ABNORMAL HIGH (ref 70–99)
Glucose-Capillary: 154 mg/dL — ABNORMAL HIGH (ref 70–99)
Glucose-Capillary: 156 mg/dL — ABNORMAL HIGH (ref 70–99)
Glucose-Capillary: 203 mg/dL — ABNORMAL HIGH (ref 70–99)

## 2010-11-04 LAB — T4: T4, Total: 8.6 ug/dL (ref 5.0–12.5)

## 2010-11-04 LAB — CBC
MCHC: 33 g/dL (ref 30.0–36.0)
MCV: 86.9 fL (ref 78.0–100.0)
Platelets: 165 10*3/uL (ref 150–400)
RDW: 14.6 % (ref 11.5–15.5)

## 2010-11-08 LAB — HIV ANTIBODY (ROUTINE TESTING W REFLEX): HIV: NONREACTIVE

## 2010-11-08 LAB — GLUCOSE, CAPILLARY
Glucose-Capillary: 105 mg/dL — ABNORMAL HIGH (ref 70–99)
Glucose-Capillary: 109 mg/dL — ABNORMAL HIGH (ref 70–99)
Glucose-Capillary: 117 mg/dL — ABNORMAL HIGH (ref 70–99)
Glucose-Capillary: 127 mg/dL — ABNORMAL HIGH (ref 70–99)
Glucose-Capillary: 127 mg/dL — ABNORMAL HIGH (ref 70–99)
Glucose-Capillary: 137 mg/dL — ABNORMAL HIGH (ref 70–99)
Glucose-Capillary: 145 mg/dL — ABNORMAL HIGH (ref 70–99)
Glucose-Capillary: 147 mg/dL — ABNORMAL HIGH (ref 70–99)
Glucose-Capillary: 153 mg/dL — ABNORMAL HIGH (ref 70–99)
Glucose-Capillary: 166 mg/dL — ABNORMAL HIGH (ref 70–99)
Glucose-Capillary: 176 mg/dL — ABNORMAL HIGH (ref 70–99)
Glucose-Capillary: 183 mg/dL — ABNORMAL HIGH (ref 70–99)
Glucose-Capillary: 315 mg/dL — ABNORMAL HIGH (ref 70–99)
Glucose-Capillary: 95 mg/dL (ref 70–99)

## 2010-11-08 LAB — CK TOTAL AND CKMB (NOT AT ARMC)
Relative Index: 1.1 (ref 0.0–2.5)
Relative Index: 1.2 (ref 0.0–2.5)
Relative Index: 1.4 (ref 0.0–2.5)

## 2010-11-08 LAB — CBC
HCT: 43.4 % (ref 39.0–52.0)
Hemoglobin: 14.2 g/dL (ref 13.0–17.0)
MCHC: 32.8 g/dL (ref 30.0–36.0)
MCV: 87 fL (ref 78.0–100.0)
Platelets: 176 10*3/uL (ref 150–400)
RBC: 4.82 MIL/uL (ref 4.22–5.81)
RDW: 14.7 % (ref 11.5–15.5)
WBC: 8.3 10*3/uL (ref 4.0–10.5)

## 2010-11-08 LAB — APTT: aPTT: 28 seconds (ref 24–37)

## 2010-11-08 LAB — COMPREHENSIVE METABOLIC PANEL
Albumin: 3.8 g/dL (ref 3.5–5.2)
Alkaline Phosphatase: 46 U/L (ref 39–117)
BUN: 12 mg/dL (ref 6–23)
CO2: 26 mEq/L (ref 19–32)
Chloride: 104 mEq/L (ref 96–112)
Creatinine, Ser: 1.23 mg/dL (ref 0.4–1.5)
GFR calc non Af Amer: >60 mL/min (ref >60)
Glucose, Bld: 132 mg/dL — ABNORMAL HIGH (ref 70–99)
Potassium: 4.3 mEq/L (ref 3.5–5.1)
Total Bilirubin: 0.4 mg/dL (ref 0.3–1.2)

## 2010-11-08 LAB — PROTIME-INR: Prothrombin Time: 13.2 seconds (ref 11.6–15.2)

## 2010-11-08 LAB — BASIC METABOLIC PANEL
Calcium: 9.3 mg/dL (ref 8.4–10.5)
Creatinine, Ser: 1.11 mg/dL (ref 0.4–1.5)
GFR calc Af Amer: >60 mL/min (ref >60)

## 2010-11-08 LAB — LIPID PANEL
HDL: 29 mg/dL — ABNORMAL LOW (ref >39)
Triglycerides: 69 mg/dL (ref <150)

## 2010-11-08 LAB — TROPONIN I
Troponin I: 0.01 ng/mL (ref 0.00–0.06)
Troponin I: <0.01 ng/mL (ref 0.00–0.06)

## 2010-11-08 LAB — DIFFERENTIAL
Basophils Absolute: 0.1 10*3/uL (ref 0.0–0.1)
Basophils Relative: 1 % (ref 0–1)
Lymphocytes Relative: 24 % (ref 12–46)
Monocytes Absolute: 0.6 10*3/uL (ref 0.1–1.0)
Neutro Abs: 6.2 10*3/uL (ref 1.7–7.7)

## 2010-11-08 LAB — RPR: RPR Ser Ql: NONREACTIVE

## 2010-11-08 LAB — HEMOGLOBIN A1C: Hgb A1c MFr Bld: 6.7 % — ABNORMAL HIGH (ref 4.6–6.1)

## 2010-12-29 NOTE — Discharge Summary (Signed)
[name] Williams, [name] Williams NO.:  [id number]   MEDICAL RECORD NO.:  [id number]          PATIENT TYPE:  INP   LOCATION:  3732                         FACILITY:  [facility]   PHYSICIAN:  Ileana Roup, M.D.  DATE OF BIRTH:  06/06/1956   DATE OF ADMISSION:  08/09/2008  DATE OF DISCHARGE:  08/10/2008                               DISCHARGE SUMMARY   DISCHARGE DIAGNOSES:  1. Nausea and vomiting of unclear etiology, resolved and the patient      ruled out for acute myocardial infarction.  2. Incidental finding of low TSH.  3. Diabetes mellitus with hemoglobin A1c of 7.6 on this admission.  4. Dizziness secondary to orthostatic hypotension, resolved.  5. Dyslipidemia.  6. Schizophrenia, followed by [facility].  7. History of esophageal candidiasis.   DISCHARGE MEDICATIONS:  1. Metformin 500 mg p.o. b.i.d.  2. Aspirin 81 mg p.o. daily.  3. Prilosec 20 mg p.o. daily.   DISPOSITION AND FOLLOWUP:  The patient was discharged home in stable  condition with no more complaints of nausea or vomiting, pain,  dizziness, or lightheadedness.  He is to follow up at the [facility] within 2 weeks of hospital discharge.  The patient  will be called at a later time with exact appointment details.  At the  time of his followup appointment, he should be asked about any further  episodes of nausea and vomiting and the results of his 2-D  echocardiogram should be reviewed.  In addition, he should be referred  for an outpatient stress test if warranted.  Furthermore, a set of  orthostatic vital signs should be checked and the patient should be  asked about any more dizziness and consideration should be given to  imaging studies such as an MRI of brain should he report continued  symptoms of vertigo or ataxia to rule out cerebellar infarct.  An  incidental finding of low TSH was noted during this admission and a  repeat TSH with free T3 and free T4 should be obtained  and followed up  on during his OPC visit.   PROCEDURE PERFORMED:  An abdominal x-ray performed on August 09, 2008,  showed no active lung disease and moderate amount of feces throughout  the colon with no obstruction or free air noted.   CONSULTATIONS:  None.   ADMITTING HISTORY AND PHYSICAL:  [name] Williams is a 55 year old African  American male with past medical history of diabetes mellitus,  schizophrenia, hyperlipidemia, and tobacco use, presenting for nausea,  vomiting, and acute abdominal pain that started per the patient's report  after he ate sausage for lunch.  His sister had same feeling without  symptoms of nausea or vomiting.  The sister was present in the room  while the patient was being interviewed and states that the nausea and  vomiting actually started earlier after the patient smoked some  cigarettes and marijuana.  Also, at that point, the patient complaining  of dizziness and was noted to be staggering around the room.  Since  then, the patient vomited 4-5 times and vomiting was  nonbloody.  He  continued to feel nauseated and continued dry heaving throughout his 10-  12 hours' ED stay.  This prompted this admission.  During his admission  history in the ED, the patient continued to fall when raising up from a  sitting to a standing position and stated that he felt dizzy when he  also stood up from bed morning of admission.  The patient's abdominal  pain was resolved by the time he was seen in the emergency department.  The patient denies any chest pain, shortness of breath, neurological  symptoms, blurry vision, headaches, hematemesis, hematochezia, melena,  or weakness/numbness.  The patient states that his abdominal pain is not  chronic, not associated with meals, and only started on the day of  admission.  On exam, the patient had a temperature of 97.9, blood  pressure of 185/100, pulse of 76, respiratory rate of 23, and oxygen  saturation of 100% on 2 liters.   In general, he was in no acute  distress.  Eye exam showed bilateral exophthalmos, pinpoint pupils,  pupils are equal, round, and reactive to light, extraocular motions  intact, muddy sclerae.  ENT exam showed the patient to have clear  oropharynx and oral cavity.  Neck exam was supple with no carotid bruits  or lymphadenopathy or JVD.  Respiratory exam showed the patient to have  clear breath sounds bilaterally.  Cardiovascular exam showed regular  rate and rhythm with no murmurs, rubs, or gallops and 2+ pedal pulses.  GI exam showed positive bowel sounds with a soft, nontender, and  nondistended abdomen.  Extremity exam showed no clubbing, cyanosis, or  edema.  GU exam showed no costovertebral angle tenderness and the  patient was noted to have a negative Hemoccult along with normal rectal  tone and light brown stool in his rectal vault.  Musculoskeletal exam  was normal.  Neurological exam showed the patient to be alert and  oriented x3.  Cranial nerves II through XII were intact.  Musculoskeletal strength 5/5 throughout.  Intact sensation to light  touch throughout.  Cerebellar intact by finger-to-nose.  Negative  Romberg.  However, gait was noted to be somewhat unsteady and the  patient was found to be falling backwards while walking.  Psych exam  showed the patient to have slow responses to question and a relatively  flat affect.   Labs on admission included a white blood cell count of 8.8 with ANC of  4.9, hemoglobin 15.7 with MCV of 89.9, and platelets of 165.  Sodium  139, potassium 4.1, chloride 105, bicarb 26, BUN 12, creatinine 1.15,  glucose 275, total bilirubin 0.7, alk phos 51, AST 24, ALT 15, total  protein 7.3, albumin 3.9, calcium 9.0, and lipase 25.  EtOH level was  negative.  Urinalysis was within normal limits.  Urine drug screen was  negative.  Hemoglobin A1c was 7.6.  TSH was 0.20.   HOSPITAL COURSE:  1. Nausea and vomiting:  The patient stated that his nausea  and      vomiting started after smoking marijuana the morning prior to      admission and was exacerbated by lunch.  It was thought most likely      that his symptoms were related to his drug abuse.  However, he      stated that he did not smoke an unusual amount of either cigarettes      or marijuana that day.  Since the patient was continuing to have  dry heaving and nausea even 12 hours after arrival at emergency      department, he was admitted to rule out acute coronary syndrome      because of his strong family history of heart disease.  His cardiac      enzymes were cycled and they were negative x3.  However, his      initial EKG showed possible ST changes in the anterior leads along      with poor R-wave progression and T-wave inversion in the inferior      and lateral leads.  His second EKG, however, was unchanged from      earlier EKG performed in July 2007.  The patient never complained      of any chest pain, shortness of breath, or palpitations during his      admission.  However, because of his slightly abnormal EKG at least      on presentation along with occasional premature ventricular      contractions noted on telemetry after he was admitted, a 2-D      echocardiogram was ordered to rule out at least any structural or      valvular heart lesions.  The results of the 2D ECHO were pending by      the time of the patient's discharge and this will need to be      followed up on at the time of his clinic visit.  The patient      remained without any complaints of nausea, vomiting, or abdominal      pain from his admission to his discharge.  He was given as needed      Phenergan.  However, he did not require any antibiotics during his      hospitalization nor any pain medications.  He was discharged home      on a proton pump inhibitor as this may help prevent future episodes      especially if gastroesophageal reflux is a more likely etiology for      his symptoms.   Other causes of his nausea and vomiting were      considered less likely including pancreatitis since his lipase was      normal.  Gastritis and peptic ulcer disease was also thought      unlikely as his symptoms were new and not chronic and there is no      history of pain or nausea or vomiting with meals.  Additionally, he      had a negative Hemoccult during this admission.  Cholecystitis or      cholelithiasis was also considered unlikely as his LFTs were normal      and he had a negative Murphy sign on exam.  Gastroparesis was also      considered unlikely, especially since his symptoms were acute      onset.  No further GI workup was done during his hospitalizations      since the symptoms were fleeting in nature.  However, if he      continues to have symptoms after he is followed up in clinic, he      may benefit from an outpatient stress test especially if his      echocardiogram results are abnormal.  2. Dizziness.  On admission, the patient complained of feeling dizzy,      especially when he went from a sitting to a standing position and      he was also found to have  a somewhat unsteady gait in the ED.  A      set of orthostatic vital signs were obtained and he was found to be      orthostatic.  This was thought most likely to his recent nausea and      vomiting causing intravascular depletion.  Therefore, his fluid      status was addressed with aggressive IV hydration using normal      saline and by the day of discharge, the patient was no longer      orthostatic and had no more complaints of dizziness or      lightheadedness.  However, should he have further episodes      especially on his outpatient followup visit, consideration should      be given to pursuing neuroimaging including an MRI of his brain to      rule out cerebellar infarcts especially if he has focal      neurological symptoms or continues to have an abnormal gait.  3. Diabetes mellitus type 2.  On  admission, the patient's metformin      was held and he was started on a sliding scale insulin.  His CBGs      remained within range throughout his hospitalization and a      hemoglobin A1c was checked and found to be 7.6.  He was restarted      on his metformin upon discharge.  Consideration should be given to      changing his metformin to an alternative oral hypoglycemic agent      should he continue to have complaints of nausea and vomiting during      his followup visit since this is can be a common side effect of      metformin use.  However, because this is a relatively new complaint      and he has been using metformin for several months now, this is      unlikely to be needed.  4. Low TSH.  An incidental finding of a low TSH was noted during this      admission.  This may be due to subclinical hyperthyroidism.  During      his OPC follow up visit, he should have a repeat TSH and also free      T3 and T4 drawn to reassess his thyroid function.   DISCHARGE LABORATORY DATA AND VITAL SIGNS:  On the day of discharge, the  patient had a temperature of 99.1, blood pressure 154/92, pulse of 64,  respiratory rate of 18, and oxygen saturation of 97% on room air.  CBC  showed a white blood cell count of 9.6, hemoglobin of 13.2, and  platelets of 136.  Sodium 142, potassium 3.7, chloride 108, bicarb 25,  BUN 7, creatinine 0.95, glucose 105, and calcium 9.2.  Fasting lipid  profile showed total cholesterol of 149, triglycerides 36, HDL 27, and  LDL 115.      Lucy Antigua, MD  Electronically Signed      Ileana Roup, M.D.  Electronically Signed    RK/MEDQ  D:  08/10/2008  T:  08/11/2008  Job:  [id number]   cc:   Genia Del, MD

## 2011-01-01 NOTE — Discharge Summary (Signed)
NAMEKATHRYN, LINAREZ NO.:  [id number]   MEDICAL RECORD NO.:  [id number]          PATIENT TYPE:  INP   LOCATION:  6707                         FACILITY:  [facility]   PHYSICIAN:  Madaline Guthrie, M.D.    DATE OF BIRTH:  18-Oct-1955   DATE OF ADMISSION:  03/11/2008  DATE OF DISCHARGE:  03/14/2008                               DISCHARGE SUMMARY   DISCHARGE DIAGNOSES:  1. New-onset diabetes mellitus type 2, presenting with hypoglycemia,      nausea, vomiting, and weight loss.  2. Oral candidiasis likely secondary to new-onset diabetes mellitus.      HIV negative.  3. Schizophrenia, withoug acute psychosis.  4. Tobacco abuse.  5. Elevated blood pressure, without diagnosis of hypertension.   DISCHARGE MEDICATIONS:  1. Metformin 500 mg p.o. b.i.d.  2. Fluconazole 200 mg p.o. daily.   DISPOSITION AND FOLLOWUP:  Mr. Krauser was discharged in hemodynamically  stable condition after he and his sister had been educated on diabetes  care.  He will [facility] up [facility] April 08, 2008 at 10:30 a.m.  At that time, please address  Mr. Tinnel' new diagnosis of diabetes and make sure that he is well  controlled.  His metformin will likely need to be titrated.  Further  workup for weight loss can also be addressed on an outpatient basis if  the patient does not gain weight with his diabetes treatment.   PROCEDURES:  An abdominal film on March 11, 2008 showed a nonobstructive  bowel gas pattern and a chest x-ray showed no active cardiopulmonary  disease.   CONSULTATIONS:  None.   ADMISSION HISTORY:  [name] is a 55 year old man with a history of  treated schizophrenia who was brought to the ED by his sister because of  persistent vomiting and weight loss.  One week prior to admission, the  patient started having nausea and vomiting, which was bilious in nature  without hematemesis or coffee ground.  He was unable to keep anything  down and although he had not had any abdominal pain, he complained of  being constipated over the month prior to admission.  He denied melena  and hematochezia.  However, he had been having odynophagia with  dysphagia that started when he developed a white coating in his mouth  and tongue.  No fevers or chills.  No urinary symptoms.  No night  sweats.  He endorsed having lost 20 pounds over the past few months.  The patient does not have high-risk sexual behaviors.   ADMISSION PHYSICAL EXAMINATION:  VITAL SIGNS:  Temperature 98.0, blood  pressure 122/88, heart rate 111, respiratory rate 16, O2 sat 99 on room  air.  GENERAL:  The patient is a thin middle-aged man in no acute distress.  HEENT:  He had white coating on his oropharynx and cheek.  NECK:  Supple without lymphadenopathy.  HEART:  Regular rate and rhythm without murmurs.  LUNGS:  Clear air movement throughout.  ABDOMEN:  Bowel sounds positive, soft, mild diffuse tenderness to  palpation without guarding,  rebound tenderness, palpable masses, or  organomegaly.  NEUROLOGIC:  Nonfocal.   ADMISSION LABORATORY DATA:  Sodium 129, potassium 4.2, chloride 92,  bicarb 25, BUN 23, creatinine 1.72, glucose 717.  Bili 1.4, alk phos 75,  AST 20, ALT 23, protein 7.0, albumin 3.6, calcium 8.9.  White blood  count 16.5, ANC 13.3, hemoglobin 15.3, MCV 88, platelets 145.   HOSPITAL COURSE:  1. New-onset diabetes.  Because of his nausea and vomiting, an      abdominal series was done to rule out obstruction.  It was felt      that the patient's symptoms were secondary to his new diagnosis of      diabetes without frank DKA or HONK.  The patient was started on a      Glucommander while in the emergency department and we were able to      take him off the insulin drip once he was admitted to the floor as      his CBG became better controlled with an insulin sliding scale, the      patient's symptoms resolved.  He received IV fluid  resuscitation,      supportive care for his nausea and his diet was then gradually      advanced, which she tolerated.  The diabetes coordinator helps      arrange for diabetic education for the patient and his sister given      the patient's mental health problems.  He will be discharged to      home on metformin and will follow up in the Outpatient Clinic for      titration.  2. Oropharyngeal candidiasis.  Our first concern was the possibility      of immunosuppression from HIV, but an HIV test was negative.      Granted he could have been immunosuppressed for another reason such      as cancer, which was crossed our minds because of his recent weight      loss.  However, his CBC with diff was normal after IV fluid      resuscitation, which made a lung malignancy less likely and he had      no signs or symptoms of possible malignancy elsewhere.  The patient      was treated with fluconazole initially IV then p.o. and      symptomatically treated with Magic mouth wash.  Depending on      whether or not signs of immunosuppression persists at followup and      if the patient continues to lose weight, then further workup for      possible malignancy can be done on an outpatient basis.  3. Schizophrenia.  [facility]. Southwell [facility].  He was not actively psychotic, suicidal, or      homicidal during his hospital course.   DISCHARGE LABORATORY DATA:  Sodium 142, potassium 3.8, chloride 113,  bicarb 25, BUN 11, creatinine 1.04, glucose 93, calcium 8.3.  Fasting  lipid profile; total cholesterol 136, triglycerides 83, HDL 23, LDL 96.  Acute hepatitis panel negative.  Hemoglobin A1c 12.3.  TSH 0.561.  White  blood count 13.2, hemoglobin 12.5, platelets 114, MCV 88, RDW 13.4.   DISCHARGE VITAL SIGNS:  Temperature 99.3, blood pressure 149/86, heart  rate 54, respiratory rate 17, O2 sat 99% on room air.      Olene Craven, M.D.   Electronically Signed  Madaline Guthrie, M.D.  Electronically Signed    MC/MEDQ  D:  04/17/2008  T:  04/18/2008  Job:  [id number]

## 2011-01-12 ENCOUNTER — Encounter: Payer: Self-pay | Admitting: Internal Medicine

## 2011-05-10 ENCOUNTER — Other Ambulatory Visit: Payer: Self-pay | Admitting: Internal Medicine

## 2011-05-11 NOTE — Telephone Encounter (Signed)
It appears that patient has not been seen in clinic since July of 2011.  Please find out who has been managing his medical care.

## 2011-05-11 NOTE — Telephone Encounter (Signed)
Have tried to call ph# listed and the ph is disconnected each time it is answered, will call pharm and leave message for pt

## 2011-05-14 ENCOUNTER — Telehealth: Payer: Self-pay | Admitting: Internal Medicine

## 2011-05-14 LAB — LIPID PANEL
Cholesterol: 136
HDL: 23 — ABNORMAL LOW
LDL Cholesterol: 96
Total CHOL/HDL Ratio: 5.9
Triglycerides: 83
VLDL: 17

## 2011-05-14 LAB — DIFFERENTIAL
Basophils Absolute: 0.1
Basophils Relative: 0
Eosinophils Absolute: 0.1
Eosinophils Relative: 1
Neutro Abs: 13.3 — ABNORMAL HIGH
Neutrophils Relative %: 80 — ABNORMAL HIGH

## 2011-05-14 LAB — CULTURE, BLOOD (ROUTINE X 2)

## 2011-05-14 LAB — CBC
HCT: 39
HCT: 47.8
Hemoglobin: 13.3
MCHC: 32
MCHC: 32.2
MCHC: 32.7
MCV: 87.6
Platelets: 114 — ABNORMAL LOW
Platelets: 145 — ABNORMAL LOW
RBC: 4.64
RDW: 13.4
RDW: 14
WBC: 13.2 — ABNORMAL HIGH
WBC: 17 — ABNORMAL HIGH

## 2011-05-14 LAB — CARDIAC PANEL(CRET KIN+CKTOT+MB+TROPI)
CK, MB: 1.7
CK, MB: 1.8
Relative Index: 1.1
Total CK: 131
Total CK: 152

## 2011-05-14 LAB — COMPREHENSIVE METABOLIC PANEL
Albumin: 3.6
Alkaline Phosphatase: 75
BUN: 23
Calcium: 8.9
Creatinine, Ser: 1.72 — ABNORMAL HIGH
Potassium: 4.2
Total Protein: 7

## 2011-05-14 LAB — HEPATITIS PANEL, ACUTE
HCV Ab: NEGATIVE
Hepatitis B Surface Ag: NEGATIVE

## 2011-05-14 LAB — URINALYSIS, ROUTINE W REFLEX MICROSCOPIC
Bilirubin Urine: NEGATIVE
Glucose, UA: >1000 — AB
Hgb urine dipstick: NEGATIVE
Ketones, ur: 15 — AB
Leukocytes, UA: NEGATIVE
pH: 5

## 2011-05-14 LAB — BASIC METABOLIC PANEL
CO2: 25
Calcium: 8.3 — ABNORMAL LOW
Chloride: 113 — ABNORMAL HIGH
Glucose, Bld: 93
Sodium: 142

## 2011-05-14 LAB — URINE CULTURE

## 2011-05-14 LAB — URINE MICROSCOPIC-ADD ON

## 2011-05-14 LAB — OCCULT BLOOD X 1 CARD TO LAB, STOOL: Fecal Occult Bld: NEGATIVE

## 2011-05-14 LAB — LIPASE, BLOOD: Lipase: 27

## 2011-05-14 MED ORDER — METFORMIN HCL 500 MG PO TABS: 500.0000 mg | ORAL_TABLET | Freq: Two times a day (BID) | ORAL | Status: DC

## 2011-05-14 NOTE — Telephone Encounter (Signed)
Sister called for medication refill for metformin.

## 2011-05-21 LAB — CK TOTAL AND CKMB (NOT AT ARMC)
Relative Index: 1.3 (ref 0.0–2.5)
Total CK: 186 U/L (ref 7–232)

## 2011-05-21 LAB — URINALYSIS, ROUTINE W REFLEX MICROSCOPIC
Bilirubin Urine: NEGATIVE
Glucose, UA: 500 mg/dL — AB
Hgb urine dipstick: NEGATIVE
Ketones, ur: 15 mg/dL — AB
pH: 6 (ref 5.0–8.0)

## 2011-05-21 LAB — ETHANOL: Alcohol, Ethyl (B): <5 mg/dL (ref 0–10)

## 2011-05-21 LAB — DIFFERENTIAL
Eosinophils Absolute: 0.1 10*3/uL (ref 0.0–0.7)
Lymphs Abs: 3 10*3/uL (ref 0.7–4.0)
Monocytes Absolute: 0.7 10*3/uL (ref 0.1–1.0)
Monocytes Relative: 9 % (ref 3–12)
Neutro Abs: 4.9 10*3/uL (ref 1.7–7.7)
Neutrophils Relative %: 56 % (ref 43–77)

## 2011-05-21 LAB — CARDIAC PANEL(CRET KIN+CKTOT+MB+TROPI)
CK, MB: 1.8 ng/mL (ref 0.3–4.0)
Total CK: 176 U/L (ref 7–232)

## 2011-05-21 LAB — BASIC METABOLIC PANEL
BUN: 11 mg/dL (ref 6–23)
CO2: 25 mEq/L (ref 19–32)
Calcium: 9.2 mg/dL (ref 8.4–10.5)
Chloride: 104 mEq/L (ref 96–112)
Creatinine, Ser: 0.95 mg/dL (ref 0.4–1.5)
GFR calc Af Amer: >60 mL/min (ref >60)
Glucose, Bld: 188 mg/dL — ABNORMAL HIGH (ref 70–99)
Potassium: 4.2 mEq/L (ref 3.5–5.1)

## 2011-05-21 LAB — POCT I-STAT, CHEM 8
BUN: 10 mg/dL (ref 6–23)
Creatinine, Ser: 1 mg/dL (ref 0.4–1.5)
Glucose, Bld: 244 mg/dL — ABNORMAL HIGH (ref 70–99)
Potassium: 4.1 mEq/L (ref 3.5–5.1)
Sodium: 140 mEq/L (ref 135–145)

## 2011-05-21 LAB — GLUCOSE, CAPILLARY
Glucose-Capillary: 111 mg/dL — ABNORMAL HIGH (ref 70–99)
Glucose-Capillary: 116 mg/dL — ABNORMAL HIGH (ref 70–99)
Glucose-Capillary: 133 mg/dL — ABNORMAL HIGH (ref 70–99)
Glucose-Capillary: 180 mg/dL — ABNORMAL HIGH (ref 70–99)

## 2011-05-21 LAB — COMPREHENSIVE METABOLIC PANEL
ALT: 15 U/L (ref 0–53)
Albumin: 3.9 g/dL (ref 3.5–5.2)
Alkaline Phosphatase: 51 U/L (ref 39–117)
Calcium: 9 mg/dL (ref 8.4–10.5)
Glucose, Bld: 275 mg/dL — ABNORMAL HIGH (ref 70–99)
Potassium: 4.1 mEq/L (ref 3.5–5.1)
Sodium: 139 mEq/L (ref 135–145)
Total Protein: 7.3 g/dL (ref 6.0–8.3)

## 2011-05-21 LAB — LIPID PANEL
HDL: 27 mg/dL — ABNORMAL LOW (ref >39)
Total CHOL/HDL Ratio: 5.5 RATIO
VLDL: 7 mg/dL (ref 0–40)

## 2011-05-21 LAB — CBC
Hemoglobin: 15.7 g/dL (ref 13.0–17.0)
MCHC: 32.5 g/dL (ref 30.0–36.0)
MCHC: 33.4 g/dL (ref 30.0–36.0)
Platelets: 165 10*3/uL (ref 150–400)
RBC: 4.76 MIL/uL (ref 4.22–5.81)
RDW: 14 % (ref 11.5–15.5)

## 2011-05-21 LAB — RAPID URINE DRUG SCREEN, HOSP PERFORMED
Barbiturates: NOT DETECTED
Opiates: NOT DETECTED
Tetrahydrocannabinol: NOT DETECTED

## 2011-05-21 LAB — URINE CULTURE: Colony Count: 4000

## 2011-05-21 LAB — HEMOGLOBIN A1C
Hgb A1c MFr Bld: 7.6 % — ABNORMAL HIGH (ref 4.6–6.1)
Mean Plasma Glucose: 171 mg/dL

## 2011-05-31 ENCOUNTER — Other Ambulatory Visit: Payer: Self-pay | Admitting: Internal Medicine

## 2011-06-01 NOTE — Telephone Encounter (Signed)
[name] Williams has not been seen in clinic in over 1 year.  Has an appointment with Dr. Milbert Coulter next month.  Will prescribe a 1 month supply to get him to his follow-up appointment.  Further refills will require that he have been seen in the clinic to assure this medication is appropriate for [name] Williams.

## 2011-06-18 ENCOUNTER — Other Ambulatory Visit: Payer: Self-pay | Admitting: Internal Medicine

## 2011-06-20 NOTE — Telephone Encounter (Signed)
Pt has scheduled appt with me this Wednesday, he hasn't been seen in clinic in a while, so I will refill at this visit. Thanks

## 2011-06-23 ENCOUNTER — Encounter: Payer: Self-pay | Admitting: Internal Medicine

## 2011-06-25 ENCOUNTER — Other Ambulatory Visit: Payer: Self-pay | Admitting: Internal Medicine

## 2011-06-25 NOTE — Telephone Encounter (Signed)
It appears he had an appt on the 7th and no showed??? Would you please confirm this.    This was the response to the last refill request : [name] has not been seen in clinic in over 1 year. Has an appointment with Dr. Milbert Coulter next month. Will prescribe a 1 month supply to get him to his follow-up appointment. Further refills will require that he have been seen in the clinic to assure this medication is appropriate for [name]

## 2011-06-29 NOTE — Telephone Encounter (Signed)
Unable to close encounter, please approve or refuse medication please  Thank you.[name] Williams, [name]11/13/20129:08 AM

## 2011-06-30 ENCOUNTER — Encounter: Payer: Self-pay | Admitting: Internal Medicine

## 2011-06-30 NOTE — Telephone Encounter (Signed)
Pt No Show on 06/23/11 and appt was canceled 11/14 reason -Provider. Has an appt 07/02/11 w/Dr Eben Burow. Tried to call pt (several times) but telephone only rings once; no answer, unable to leave message.

## 2011-06-30 NOTE — Telephone Encounter (Signed)
appt already scheduled for 11/16 @ 3:45pm with Dr Eben Burow.Kingsley Spittle Cassady11/14/20122:55 PM

## 2011-07-02 ENCOUNTER — Encounter: Payer: Self-pay | Admitting: Internal Medicine

## 2011-07-02 ENCOUNTER — Ambulatory Visit (INDEPENDENT_AMBULATORY_CARE_PROVIDER_SITE_OTHER): Payer: Medicare Other | Admitting: Internal Medicine

## 2011-07-02 ENCOUNTER — Telehealth: Payer: Self-pay | Admitting: Internal Medicine

## 2011-07-02 DIAGNOSIS — E119 Type 2 diabetes mellitus without complications: Secondary | ICD-10-CM

## 2011-07-02 DIAGNOSIS — F39 Unspecified mood [affective] disorder: Secondary | ICD-10-CM

## 2011-07-02 DIAGNOSIS — I10 Essential (primary) hypertension: Secondary | ICD-10-CM

## 2011-07-02 MED ORDER — LISINOPRIL 20 MG PO TABS: 20.0000 mg | ORAL_TABLET | Freq: Every day | ORAL | Status: DC

## 2011-07-02 MED ORDER — METFORMIN HCL 500 MG PO TABS: 500.0000 mg | ORAL_TABLET | Freq: Two times a day (BID) | ORAL | Status: DC

## 2011-07-02 NOTE — Progress Notes (Signed)
  Subjective:    Patient ID: [name] Williams, male    DOB: Mar 01, 1956, 55 y.o.   MRN: [id number]  HPI The patient is a 55 year old male comes in today for medication refill. His has no chief complaint today. He's not having any symptoms. No problems breathing, no hypoglycemic episodes. No headaches. No problems sleeping. No problems with his mood. Patient states that he gets Haldol injections every 4 weeks for mental health. He's not sure of the dose.   Review of Systems  Constitutional: Negative for fever, chills, activity change, appetite change, fatigue and unexpected weight change.  HENT: Negative.   Respiratory: Negative for choking, chest tightness, shortness of breath and wheezing.   Cardiovascular: Negative for chest pain, palpitations and leg swelling.  Gastrointestinal: Negative for nausea, vomiting, abdominal pain, diarrhea and constipation.  Genitourinary: Negative for dysuria, urgency, frequency, enuresis and difficulty urinating.  Neurological: Negative.   Psychiatric/Behavioral: Negative.   All other systems reviewed and are negative.    Vitals: Blood pressure: 143/90 Pulse: 72 Temperature: 97.96F Oxygen saturation: 99% on room air Weight: 154 pounds    Objective:   Physical Exam  Constitutional: He is oriented to person, place, and time. He appears well-developed and well-nourished.  HENT:  Head: Normocephalic and atraumatic.  Eyes: EOM are normal. Pupils are equal, round, and reactive to light.  Neck: Normal range of motion. Neck supple.  Cardiovascular: Normal rate, regular rhythm and normal heart sounds.   Pulmonary/Chest: Effort normal and breath sounds normal. No respiratory distress. He has no wheezes. He has no rales.  Abdominal: Soft. Bowel sounds are normal. He exhibits no distension. There is no tenderness. There is no rebound.  Musculoskeletal: Normal range of motion.  Neurological: He is alert and oriented to person, place, and time. No cranial nerve  deficit.          Assessment & Plan:  1. Medication refill-patient will be given medication refill for his lisinopril and metformin.  2. Diabetes mellitus type 2-patient's hemoglobin A1c is 6.2 today. We will not make any changes to his regimen today. He is taking metformin 500 mg twice daily. He will be seen back in 3 months to check his hemoglobin A1c and to meet his primary care doctor. He is on an ACE inhibitor.  3. Hypertension-patient's blood pressure is 143/92 today. We will not make medication change at this time. He is currently taking lisinopril 20 mg daily. We will see him back in 3 months for blood pressure recheck.  4. Disposition-patient will be seen back in 3 months for a followup of his medical problems. He will also meet his primary care Dr. Dr. Milbert Coulter at this time. No changes to his medication regimen today.  5. Clarification of medications-patient gets Haldol injections from [facility]. He's not sure of the amount however he gets them every 4 weeks. I was unable to find appropriate medication in the computer system so that the medication is not exactly accurate in the medication list however I felt was important to include that he does get Haldol injections every 4 weeks.

## 2011-07-02 NOTE — Telephone Encounter (Signed)
I phoned patient, as he has recently requested refills for lisinopril and metformin.  As per epic records, patient has not been seen in clinic since 02/2010.  I do not feel comfortable prescribing these medications without first assessing the patient and reviewing blood work.   I called number on file, and the call rang once, with no answer or answering machine.

## 2011-07-02 NOTE — Patient Instructions (Signed)
You were seen today for a check up and medication refill. Your blood pressure is good today and we are not making any changes to your blood pressure medication. Your diabetes is doing okay and we will see you back in 3 months to check on your sugars. Please feel free to call our office if you are having any problems or have any questions. Our number is [phone].

## 2011-07-06 NOTE — Progress Notes (Signed)
I have reviewed and discussed the care of this patient in detail with the resident physician including pertinent patient records, physical exam findings and data. I agree with details of this encounter.   

## 2011-07-06 NOTE — Progress Notes (Signed)
  Subjective:    Patient ID: [name] Williams, male    DOB: 10/21/1955, 55 y.o.   MRN: [id number]  HPI Opened in error.  Review of Systems     Objective:   Physical Exam        Assessment & Plan:

## 2011-11-08 ENCOUNTER — Other Ambulatory Visit: Payer: Self-pay | Admitting: Internal Medicine

## 2011-11-09 NOTE — Telephone Encounter (Signed)
Pharmacy aware denied - to keep appt 11/10/11 per Dr Milbert Coulter.

## 2011-11-10 ENCOUNTER — Encounter: Payer: Self-pay | Admitting: Internal Medicine

## 2011-11-10 ENCOUNTER — Ambulatory Visit (INDEPENDENT_AMBULATORY_CARE_PROVIDER_SITE_OTHER): Payer: Medicare Other | Admitting: Internal Medicine

## 2011-11-10 VITALS — BP 151/87 | HR 73 | Temp 97.6°F | Wt 155.5 lb

## 2011-11-10 DIAGNOSIS — F17200 Nicotine dependence, unspecified, uncomplicated: Secondary | ICD-10-CM

## 2011-11-10 DIAGNOSIS — I10 Essential (primary) hypertension: Secondary | ICD-10-CM

## 2011-11-10 DIAGNOSIS — E785 Hyperlipidemia, unspecified: Secondary | ICD-10-CM

## 2011-11-10 DIAGNOSIS — E119 Type 2 diabetes mellitus without complications: Secondary | ICD-10-CM

## 2011-11-10 LAB — COMPREHENSIVE METABOLIC PANEL
Albumin: 4.1 g/dL (ref 3.5–5.2)
Alkaline Phosphatase: 56 U/L (ref 39–117)
BUN: 16 mg/dL (ref 6–23)
Calcium: 9.4 mg/dL (ref 8.4–10.5)
Creat: 1.08 mg/dL (ref 0.50–1.35)
Glucose, Bld: 116 mg/dL — ABNORMAL HIGH (ref 70–99)
Sodium: 141 mEq/L (ref 135–145)
Total Bilirubin: 0.4 mg/dL (ref 0.3–1.2)
Total Protein: 7 g/dL (ref 6.0–8.3)

## 2011-11-10 LAB — LIPID PANEL
HDL: 37 mg/dL — ABNORMAL LOW (ref >39)
LDL Cholesterol: 102 mg/dL — ABNORMAL HIGH (ref 0–99)
Total CHOL/HDL Ratio: 4.1 Ratio
VLDL: 13 mg/dL (ref 0–40)

## 2011-11-10 LAB — CBC
MCHC: 32.6 g/dL (ref 30.0–36.0)
Platelets: 187 10*3/uL (ref 150–400)
RDW: 14.7 % (ref 11.5–15.5)
WBC: 9.4 10*3/uL (ref 4.0–10.5)

## 2011-11-10 LAB — POCT GLYCOSYLATED HEMOGLOBIN (HGB A1C): Hemoglobin A1C: 6.2

## 2011-11-10 MED ORDER — METFORMIN HCL 1000 MG PO TABS: 500.0000 mg | ORAL_TABLET | Freq: Two times a day (BID) | ORAL | Status: DC

## 2011-11-10 NOTE — Progress Notes (Signed)
  Subjective:   Patient ID: [name] Williams male   DOB: [birth year] 56 y.o.   MRN: [id number]  HPI: [name] Williams is a 56 y.o. man with h/o DM and HTN that presents for DM follow up.  He has no complaints.  He reports he is working hard to quit smoking and has decreased to half pack per week.  He does not check his blood sugar at home.  Denies polyuria/polydipsia/blurry vission.  Denies dizziness/HA/lightheadedness.  He needs to schedule an eye exam.  Lipid panel to be drawn today.  Does not report significant exercise.     Current Outpatient Prescriptions  Medication Sig Dispense Refill  . aspirin 325 MG EC tablet Take 325 mg by mouth daily.        . haloperidol lactate (HALDOL) 5 MG/ML injection every 6 (six) hours as needed. Unsure of dosing, pt gets from mental health q 4 weeks chronically.       Marland Kitchen lisinopril (PRINIVIL,ZESTRIL) 20 MG tablet Take 1 tablet (20 mg total) by mouth daily.  30 tablet  3  . metFORMIN (GLUCOPHAGE) 1000 MG tablet Take 0.5 tablets (500 mg total) by mouth 2 (two) times daily with a meal.  60 tablet  5  . DISCONTD: metFORMIN (GLUCOPHAGE) 500 MG tablet Take 1 tablet (500 mg total) by mouth 2 (two) times daily with a meal.  60 tablet  3  . rosuvastatin (CRESTOR) 20 MG tablet Take 20 mg by mouth daily.         Review of Systems: Constitutional: Denies fever, chills, diaphoresis, appetite change and fatigue.  HEENT: Denies photophobia, eye pain, redness, hearing loss, ear pain, congestion, sore throat, rhinorrhea, sneezing, mouth sores, trouble swallowing, neck pain, neck stiffness and tinnitus.   Respiratory: Denies SOB, DOE, cough, chest tightness,  and wheezing.   Cardiovascular: Denies chest pain, palpitations and leg swelling.  Gastrointestinal: Denies nausea, vomiting, abdominal pain, diarrhea, constipation, blood in stool and abdominal distention.  Genitourinary: Denies dysuria, urgency, frequency, hematuria, flank pain and difficulty urinating.  Musculoskeletal:  Denies myalgias, back pain, joint swelling, arthralgias and gait problem.  Skin: Denies pallor, rash and wound.  Neurological: Denies dizziness, seizures, syncope, weakness, light-headedness, numbness and headaches.    Objective:  Physical Exam: Filed Vitals:   11/10/11 1618  BP: 151/87  Pulse: 73  Temp: 97.6 F (36.4 C)  TempSrc: Oral  Weight: 155 lb 8 oz (70.534 kg)   Constitutional: Vital signs reviewed.  Patient is a well-developed and well-nourished man in no acute distress and cooperative with exam.  Eyes: PERRL, EOMI, conjunctivae normal, No scleral icterus.  Neck: No thyromegaly  Cardiovascular: RRR, S1 normal, S2 normal, no MRG, pulses symmetric and intact bilaterally Pulmonary/Chest: CTAB, no wheezes, rales, or rhonchi Abdominal: Soft. Non-tender, non-distended, bowel sounds are normal, no masses, organomegaly, or guarding present.  Musculoskeletal: No joint deformities, erythema, or stiffness, ROM full and no nontender Neurological: A&O x3, Strength is normal and symmetric bilaterally, cranial nerve II-XII are grossly intact, no focal motor deficit, sensory intact to light touch bilaterally.  Skin: Warm, dry and intact. No rash, cyanosis, or clubbing.  Psychiatric: Normal mood and affect. speech and behavior is normal. Judgment and thought content normal. Cognition and memory are normal.   Assessment & Plan:  Case and care discussed with Dr. Aundria Rud.  Patient to return in 3 months for DM and HTN follow up. Please see problem oriented charting for further details.

## 2011-11-10 NOTE — Patient Instructions (Signed)
-  Continue taking metformin twice a day.  I sent in new pills that are 1000mg  each, so you can take 1 pill in the morning and 1 pill at night.  -I will call you about a plan for your blood pressure and cholesterol once I get your labs back.  Please be sure to bring all of your medications with you to every visit.  Should you have any new or worsening symptoms, please be sure to call the clinic at [phone].

## 2011-11-11 LAB — MICROALBUMIN / CREATININE URINE RATIO: Microalb, Ur: 3.08 mg/dL — ABNORMAL HIGH (ref 0.00–1.89)

## 2011-11-11 MED ORDER — LISINOPRIL-HYDROCHLOROTHIAZIDE 20-12.5 MG PO TABS: 1.0000 | ORAL_TABLET | Freq: Every day | ORAL | Status: DC

## 2011-11-11 MED ORDER — ROSUVASTATIN CALCIUM 20 MG PO TABS: 20.0000 mg | ORAL_TABLET | Freq: Every day | ORAL | Status: DC

## 2011-11-11 NOTE — Assessment & Plan Note (Signed)
Lab Results  Component Value Date   NA 141 11/10/2011   K 4.1 11/10/2011   CL 105 11/10/2011   CO2 29 11/10/2011   BUN 16 11/10/2011   CREATININE 1.08 11/10/2011   CREATININE 1.17 12/01/2009    BP Readings from Last 3 Encounters:  11/10/11 151/87  07/02/11 143/92  03/04/10 133/86    Assessment: Hypertension control:  moderately elevated  Progress toward goals:  deteriorated Barriers to meeting goals:  no barriers identified  Plan: Hypertension treatment:  Change lisinopril 20 to Lisinopril HCTZ 20/12.5; I called patient to make this change day after appt after labs returned

## 2011-11-11 NOTE — Assessment & Plan Note (Signed)
Lab Results  Component Value Date   HGBA1C 6.2 11/10/2011   HGBA1C 6.6 01/02/2010   CREATININE 1.08 11/10/2011   CREATININE 1.17 12/01/2009   MICROALBUR 3.08* 11/10/2011   MICRALBCREAT 18.9 11/10/2011   CHOL 152 11/10/2011   HDL 37* 11/10/2011   TRIG 64 11/10/2011    Last eye exam and foot exam:    Component Value Date/Time   HMDIABFOOTEX done 03/04/2010    Assessment: Diabetes control: controlled Progress toward goals: at goal Barriers to meeting goals: no barriers identified  Plan: Diabetes treatment: continue current medications Refer to: none Instruction/counseling given: reminded to get eye exam, reminded to bring medications to each visit and discussed diet

## 2011-11-11 NOTE — Assessment & Plan Note (Signed)
Patient continuing to cut down. Encouraged complete cessation.

## 2011-12-30 ENCOUNTER — Telehealth: Payer: Self-pay | Admitting: Dietician

## 2011-12-30 NOTE — Telephone Encounter (Signed)
Re readiness to quit smoking and cessation counseling

## 2012-01-05 ENCOUNTER — Encounter: Payer: Self-pay | Admitting: Dietician

## 2012-01-05 NOTE — Telephone Encounter (Signed)
Will mail patient information about [facility] quitline.

## 2012-01-18 NOTE — Progress Notes (Deleted)
Fax from [employer] if they can substitute lisinopril 20 mg  Instead of zestril 20 mg

## 2012-03-23 ENCOUNTER — Ambulatory Visit (INDEPENDENT_AMBULATORY_CARE_PROVIDER_SITE_OTHER): Payer: Medicare Other | Admitting: Internal Medicine

## 2012-03-23 ENCOUNTER — Encounter: Payer: Self-pay | Admitting: Internal Medicine

## 2012-03-23 VITALS — BP 116/76 | HR 71 | Temp 98.2°F | Wt 154.9 lb

## 2012-03-23 DIAGNOSIS — E785 Hyperlipidemia, unspecified: Secondary | ICD-10-CM

## 2012-03-23 DIAGNOSIS — I10 Essential (primary) hypertension: Secondary | ICD-10-CM

## 2012-03-23 DIAGNOSIS — E119 Type 2 diabetes mellitus without complications: Secondary | ICD-10-CM

## 2012-03-23 DIAGNOSIS — F17200 Nicotine dependence, unspecified, uncomplicated: Secondary | ICD-10-CM

## 2012-03-23 LAB — GLUCOSE, CAPILLARY: Glucose-Capillary: 100 mg/dL — ABNORMAL HIGH (ref 70–99)

## 2012-03-23 LAB — POCT GLYCOSYLATED HEMOGLOBIN (HGB A1C): Hemoglobin A1C: 6.8

## 2012-03-23 MED ORDER — ROSUVASTATIN CALCIUM 20 MG PO TABS: 20.0000 mg | ORAL_TABLET | Freq: Every day | ORAL | Status: DC

## 2012-03-23 NOTE — Assessment & Plan Note (Signed)
Patient continuing to cut down; encouraged smoking cessation again

## 2012-03-23 NOTE — Assessment & Plan Note (Signed)
Lab Results  Component Value Date   HGBA1C 6.8 03/23/2012   HGBA1C 6.6 01/02/2010   CREATININE 1.08 11/10/2011   CREATININE 1.17 12/01/2009   MICROALBUR 3.08* 11/10/2011   MICRALBCREAT 18.9 11/10/2011   CHOL 152 11/10/2011   HDL 37* 11/10/2011   TRIG 64 11/10/2011    Last eye exam and foot exam:    Component Value Date/Time   HMDIABFOOTEX done 03/04/2010   foot exam repeated today  Assessment: Diabetes control: controlled Progress toward goals: at goal Barriers to meeting goals: no barriers identified  Plan: Diabetes treatment: Increase metformin to 1000 mg in the morning, and may continue 500 mg at bedtime Refer to: Podiatry Instruction/counseling given: reminded to bring medications to each visit and discussed foot care

## 2012-03-23 NOTE — Assessment & Plan Note (Signed)
Lab Results  Component Value Date   NA 141 11/10/2011   K 4.1 11/10/2011   CL 105 11/10/2011   CO2 29 11/10/2011   BUN 16 11/10/2011   CREATININE 1.08 11/10/2011   CREATININE 1.17 12/01/2009    BP Readings from Last 3 Encounters:  03/23/12 116/76  11/10/11 151/87  07/02/11 143/92    Assessment: Hypertension control:  controlled  Progress toward goals:  at goal Barriers to meeting goals:  no barriers identified  Plan: Hypertension treatment:  continue current medications Which is lisinopril-hydrochlorothiazide 20/12.5 daily

## 2012-03-23 NOTE — Patient Instructions (Signed)
-  Increase metformin 500mg  to twice daily  -Restart taking Crestor every night before bed  You're doing a great job taking your medications, keep it up!  Please be sure to bring all of your medications with you to every visit.  Should you have any new or worsening symptoms, please be sure to call the clinic at [phone].

## 2012-03-23 NOTE — Progress Notes (Signed)
Subjective:   Patient ID: [name] Williams male   DOB: 08/18/1955 56 y.o.   MRN: [id number]  HPI: [name] is a 56 y.o. man with history of CVA, diabetes, and hypertension who presents today for hypertension and diabetes followup. He has no complaints today. He reports that he continues to work hard to quit smoking, but plans to completely quit within the next year. He does not check blood sugars at home. Denies symptoms of hypoglycemia including headache, dizziness, shortness of breath, palpitations. Denies symptoms of hyperglycemia including polyuria, polydipsia, and blurry vision. He does not report significant exercise.  Patient's sister seems to be a great support source;  she sees that he takes all his medications regularly    Past Medical History  Diagnosis Date  . Diabetes mellitus   . Hypertension   . History of stroke    Current Outpatient Prescriptions  Medication Sig Dispense Refill  . haloperidol lactate (HALDOL) 5 MG/ML injection every 6 (six) hours as needed. Unsure of dosing, pt gets from mental health q 4 weeks chronically.       Marland Kitchen lisinopril-hydrochlorothiazide (PRINZIDE,ZESTORETIC) 20-12.5 MG per tablet Take 1 tablet by mouth daily.  90 tablet  3  . metFORMIN (GLUCOPHAGE) 1000 MG tablet Take 0.5 tablets (500 mg total) by mouth 2 (two) times daily with a meal.  60 tablet  5  . aspirin 325 MG EC tablet Take 325 mg by mouth daily.        . rosuvastatin (CRESTOR) 20 MG tablet Take 1 tablet (20 mg total) by mouth daily.  90 tablet  3  . DISCONTD: rosuvastatin (CRESTOR) 20 MG tablet Take 1 tablet (20 mg total) by mouth daily.  90 tablet  3   Family History  Problem Relation Age of Onset  . Hypertension Mother   . Hypertension Father   . Coronary artery disease Maternal Aunt   . Diabetes Maternal Aunt   . Coronary artery disease Maternal Uncle   . Diabetes Maternal Uncle   . Diabetes Paternal Aunt   . Coronary artery disease Paternal Aunt   . Diabetes Paternal  Uncle   . Coronary artery disease Paternal Uncle    History   Social History  . Marital Status: Legally Separated    Spouse Name: N/A    Number of Children: 0  . Years of Education: 12th   Social History Main Topics  . Smoking status: Current Everyday Smoker -- 0.1 packs/day for 10 years    Types: Cigarettes  . Smokeless tobacco: None  . Alcohol Use: Yes     2-3 cans of beer/week  . Drug Use: Yes     marijuana occassionally   . Sexually Active: No   Other Topics Concern  . None   Social History Narrative   Does regular exercise.Has never worked, sister takes care of him   Review of Systems: General: no fevers, chills, changes in weight, changes in appetite Skin: no rash HEENT: no blurry vision, hearing changes, sore throat Pulm: no dyspnea, coughing, wheezing CV: no chest pain, palpitations, shortness of breath Abd: no abdominal pain, nausea/vomiting, diarrhea/constipation GU: no dysuria, hematuria, polyuria Ext: no arthralgias, myalgias Neuro: no weakness, numbness, or tingling  Objective:  Physical Exam: Filed Vitals:   03/23/12 1617  BP: 116/76  Pulse: 71  Temp: 98.2 F (36.8 C)  TempSrc: Oral  Weight: 154 lb 14.4 oz (70.262 kg)   Constitutional: Vital signs reviewed.  Patient is a well-developed and well-nourished man in no acute distress  and cooperative with exam Mouth: no erythema or exudates, MMM, poor dentition Eyes: PERRL, EOMI, conjunctivae normal, No scleral icterus.   Cardiovascular: RRR, S1 normal, S2 normal, no MRG, pulses symmetric and intact bilaterally Pulmonary/Chest: CTAB, no wheezes, rales, or rhonchi Abdominal: Soft. Non-tender, non-distended, bowel sounds are normal, no masses, organomegaly, or guarding present.  Musculoskeletal: Onychomycoses of multiple toenails, significant dry skin on bilateral feet  Neurological: A&O x3, Strength is normal and symmetric bilaterally, cranial nerve II-XII are grossly intact, no focal motor deficit,  sensory intact to light touch bilaterally.  Skin: Warm, dry and intact. No rash, cyanosis, or clubbing.    Assessment & Plan:   Case and care discussed with Dr. Rogelia Boga. Please see problem-oriented charting for further details. Patient to return in 3 months for diabetes and hypertension followup. If diabetes continues to be well-controlled at that visit, we may proceed with six-month followup.

## 2012-03-23 NOTE — Assessment & Plan Note (Signed)
Last LDL was 102 in March 2013; patient had discontinued Crestor; given history of CVA and diabetes, encourage that he restart Crestor 20 mg before bed, he agrees.

## 2012-06-28 ENCOUNTER — Ambulatory Visit (INDEPENDENT_AMBULATORY_CARE_PROVIDER_SITE_OTHER): Payer: Medicare Other | Admitting: Internal Medicine

## 2012-06-28 ENCOUNTER — Encounter: Payer: Self-pay | Admitting: Internal Medicine

## 2012-06-28 VITALS — BP 125/81 | HR 85 | Temp 97.1°F | Wt 162.8 lb

## 2012-06-28 DIAGNOSIS — Z8679 Personal history of other diseases of the circulatory system: Secondary | ICD-10-CM

## 2012-06-28 DIAGNOSIS — E119 Type 2 diabetes mellitus without complications: Secondary | ICD-10-CM

## 2012-06-28 DIAGNOSIS — E785 Hyperlipidemia, unspecified: Secondary | ICD-10-CM

## 2012-06-28 DIAGNOSIS — I10 Essential (primary) hypertension: Secondary | ICD-10-CM

## 2012-06-28 LAB — LIPID PANEL
LDL Cholesterol: 74 mg/dL (ref 0–99)
Triglycerides: 84 mg/dL (ref <150)
VLDL: 17 mg/dL (ref 0–40)

## 2012-06-28 LAB — BASIC METABOLIC PANEL
Chloride: 105 mEq/L (ref 96–112)
Creat: 1.15 mg/dL (ref 0.50–1.35)
Potassium: 4.1 mEq/L (ref 3.5–5.3)
Sodium: 141 mEq/L (ref 135–145)

## 2012-06-28 LAB — POCT GLYCOSYLATED HEMOGLOBIN (HGB A1C): Hemoglobin A1C: 6.4

## 2012-06-28 NOTE — Assessment & Plan Note (Signed)
Lab Results  Component Value Date   HGBA1C 6.4 06/28/2012   HGBA1C 6.6 01/02/2010   CREATININE 1.08 11/10/2011   CREATININE 1.17 12/01/2009   MICROALBUR 3.08* 11/10/2011   MICRALBCREAT 18.9 11/10/2011   CHOL 152 11/10/2011   HDL 37* 11/10/2011   TRIG 64 11/10/2011    Last eye exam and foot exam:    Component Value Date/Time   HMDIABFOOTEX done 03/04/2010    Assessment: Diabetes control: controlled Progress toward goals: at goal Barriers to meeting goals: no barriers identified  Plan: Diabetes treatment: continue current medications - metformin 1000mg  BID Refer to: none Instruction/counseling given: reminded to get eye exam

## 2012-06-28 NOTE — Progress Notes (Signed)
Subjective:   Patient ID: [name] Williams male   DOB: 06/17/1956 56 y.o.   MRN: [id number]  HPI: [name] Williams is a 56 y.o. man with h/o CVA, DM and HTN.  He presents today for routine follow up regarding his DM and has no complaints at this time. He lives with his sister who looks out for him and gives him his meds as scheduled.  She is in the waiting room, and he allowed me to discuss his medications with her.  She reports that he is compliant with all of his medications.  He continues to go to [facility] for monthly haldol injections.    Diet: Bologna sandwhich, sausage, eggs, rice, occ fruits/vegetables (apples, bananas)   Past Medical History  Diagnosis Date  . Diabetes mellitus   . Hypertension   . History of stroke    Current Outpatient Prescriptions  Medication Sig Dispense Refill  . aspirin 325 MG EC tablet Take 325 mg by mouth daily.        . haloperidol lactate (HALDOL) 5 MG/ML injection every 6 (six) hours as needed. Unsure of dosing, pt gets from mental health q 4 weeks chronically.       Marland Kitchen lisinopril-hydrochlorothiazide (PRINZIDE,ZESTORETIC) 20-12.5 MG per tablet Take 1 tablet by mouth daily.  90 tablet  3  . metFORMIN (GLUCOPHAGE) 1000 MG tablet Take 0.5 tablets (500 mg total) by mouth 2 (two) times daily with a meal.  60 tablet  5  . rosuvastatin (CRESTOR) 20 MG tablet Take 1 tablet (20 mg total) by mouth daily.  90 tablet  3   Family History  Problem Relation Age of Onset  . Hypertension Mother   . Hypertension Father   . Coronary artery disease Maternal Aunt   . Diabetes Maternal Aunt   . Coronary artery disease Maternal Uncle   . Diabetes Maternal Uncle   . Diabetes Paternal Aunt   . Coronary artery disease Paternal Aunt   . Diabetes Paternal Uncle   . Coronary artery disease Paternal Uncle    History   Social History  . Marital Status: Legally Separated    Spouse Name: N/A    Number of Children: 0  . Years of Education: 12th   Social History Main Topics  .  Smoking status: Current Every Day Smoker -- 0.1 packs/day for 10 years    Types: Cigarettes  . Smokeless tobacco: Not on file  . Alcohol Use: Yes     Comment: 2-3 cans of beer/week  . Drug Use: Yes     Comment: marijuana occassionally   . Sexually Active: No   Other Topics Concern  . Not on file   Social History Narrative   Does regular exercise.Has never worked, sister takes care of him   Review of Systems: Constitutional: Denies fever, chills, diaphoresis, appetite change and fatigue.  HEENT: Denies photophobia, eye pain, redness, hearing loss, ear pain, congestion, sore throat, rhinorrhea, sneezing, mouth sores, trouble swallowing, neck pain, neck stiffness and tinnitus.   Respiratory: Denies SOB, DOE, cough, chest tightness,  and wheezing.   Cardiovascular: Denies chest pain, palpitations and leg swelling.  Gastrointestinal: Denies nausea, vomiting, abdominal pain, diarrhea, constipation, blood in stool and abdominal distention.  Genitourinary: Denies dysuria, urgency, frequency, hematuria, flank pain and difficulty urinating.  Musculoskeletal: Denies myalgias, back pain, joint swelling, arthralgias and gait problem.  Skin: Denies pallor, rash and wound.  Neurological: Denies dizziness, seizures, syncope, weakness, light-headedness, numbness and headaches.   Objective:  Physical Exam: Filed Vitals:  06/28/12 1603  BP: 125/81  Pulse: 85  Temp: 97.1 F (36.2 C)  TempSrc: Oral  Weight: 162 lb 12.8 oz (73.846 kg)  SpO2: 99%   Constitutional: Vital signs reviewed.  Patient is a well-developed and well-nourished man in no acute distress and cooperative with exam.  Head: Normocephalic and atraumatic Mouth: no erythema or exudates, MMM Cardiovascular: RRR, S1 normal, S2 normal, no MRG, pulses symmetric and intact bilaterally Pulmonary/Chest: CTAB, no wheezes, rales, or rhonchi Abdominal: Soft. Non-tender, non-distended, bowel sounds are normal, no masses, organomegaly, or  guarding present.   Neurological: A&O x person, place and year, speech is difficult to understand Skin: Warm, dry and intact. No rash, cyanosis, or clubbing.  Psychiatric: Normal mood and affect. Behavior is normal.   Assessment & Plan:  Case and care discussed with Dr. Eben Burow. Please see problem oriented charting for further details. Patient to return in 6 months for DM/HTN follow up.

## 2012-06-28 NOTE — Assessment & Plan Note (Signed)
Lab Results  Component Value Date   NA 141 11/10/2011   K 4.1 11/10/2011   CL 105 11/10/2011   CO2 29 11/10/2011   BUN 16 11/10/2011   CREATININE 1.08 11/10/2011   CREATININE 1.17 12/01/2009    BP Readings from Last 3 Encounters:  06/28/12 125/81  03/23/12 116/76  11/10/11 151/87    Assessment: Hypertension control:  controlled  Progress toward goals:  at goal Barriers to meeting goals:  no barriers identified  Plan: Hypertension treatment:  continue current medications - lisinopril-hctz 20/12.5; cmet today

## 2012-06-28 NOTE — Patient Instructions (Addendum)
-  Please be sure to have your eye appointment made.  Please be sure to bring all of your medications with you to every visit.  Should you have any new or worsening symptoms, please be sure to call the clinic at [phone].

## 2012-06-28 NOTE — Assessment & Plan Note (Signed)
Patient has been compliant with crestor.  Will recheck lipid panel today, since LDL = 102 in 10/2011 when he was off of crestor.

## 2012-06-28 NOTE — Assessment & Plan Note (Signed)
Counseled on smoking cessation - he wants to quit by [birth year]. Continues daily ASA BP well managed Compliant with statin, will monitor lipid panel today.

## 2012-11-23 ENCOUNTER — Other Ambulatory Visit: Payer: Self-pay | Admitting: *Deleted

## 2012-11-23 DIAGNOSIS — E119 Type 2 diabetes mellitus without complications: Secondary | ICD-10-CM

## 2012-11-23 MED ORDER — METFORMIN HCL 1000 MG PO TABS: 500.0000 mg | ORAL_TABLET | Freq: Two times a day (BID) | ORAL | Status: DC

## 2013-02-22 ENCOUNTER — Other Ambulatory Visit: Payer: Self-pay

## 2013-05-02 ENCOUNTER — Ambulatory Visit (INDEPENDENT_AMBULATORY_CARE_PROVIDER_SITE_OTHER): Payer: Medicare Other | Admitting: Internal Medicine

## 2013-05-02 ENCOUNTER — Encounter: Payer: Self-pay | Admitting: Internal Medicine

## 2013-05-02 VITALS — BP 132/84 | HR 63 | Temp 97.7°F | Wt 167.8 lb

## 2013-05-02 DIAGNOSIS — Z8679 Personal history of other diseases of the circulatory system: Secondary | ICD-10-CM

## 2013-05-02 DIAGNOSIS — E119 Type 2 diabetes mellitus without complications: Secondary | ICD-10-CM

## 2013-05-02 DIAGNOSIS — Z8673 Personal history of transient ischemic attack (TIA), and cerebral infarction without residual deficits: Secondary | ICD-10-CM

## 2013-05-02 DIAGNOSIS — Z1211 Encounter for screening for malignant neoplasm of colon: Secondary | ICD-10-CM

## 2013-05-02 LAB — GLUCOSE, CAPILLARY: Glucose-Capillary: 106 mg/dL — ABNORMAL HIGH (ref 70–99)

## 2013-05-02 MED ORDER — ATORVASTATIN CALCIUM 80 MG PO TABS: 80.0000 mg | ORAL_TABLET | Freq: Every day | ORAL | Status: DC

## 2013-05-02 NOTE — Progress Notes (Signed)
Subjective:   Patient ID: [name] Williams male   DOB: 1956-03-17 57 y.o.   MRN: [id number]  HPI: [name] is a 57 y.o. man with h/o DM, HTN & CVA who presents for routine follow up. He has no complaints today; he lives with his sister who helps with his medications.  He refuses tdap, flu and pneumonia vaccines.   I spoke with his sister, and he is not taking crestor because it was too expensive, and they have stopped taking ASA as well.Continues to take metformin daily.   Review of Systems: Constitutional: Denies fever, chills, diaphoresis, appetite change and fatigue.  HEENT: Denies photophobia, eye pain, redness, hearing loss, ear pain, congestion, sore throat, rhinorrhea, sneezing, mouth sores, trouble swallowing, neck pain, neck stiffness and tinnitus.  Respiratory: Denies SOB, DOE, cough, chest tightness, and wheezing.  Cardiovascular: Denies chest pain, palpitations and leg swelling.  Gastrointestinal: Denies nausea, vomiting, abdominal pain, diarrhea, constipation,blood in stool and abdominal distention.  Neurological: Denies dizziness, seizures, syncope, weakness, lightheadedness, numbness and headaches.    Past Medical History  Diagnosis Date  . Diabetes mellitus   . Hypertension   . History of stroke    Current Outpatient Prescriptions  Medication Sig Dispense Refill  . aspirin 325 MG EC tablet Take 325 mg by mouth daily.        . haloperidol lactate (HALDOL) 5 MG/ML injection every 6 (six) hours as needed. Unsure of dosing, pt gets from mental health q 4 weeks chronically.       Marland Kitchen lisinopril-hydrochlorothiazide (PRINZIDE,ZESTORETIC) 20-12.5 MG per tablet Take 1 tablet by mouth daily.  90 tablet  3  . metFORMIN (GLUCOPHAGE) 1000 MG tablet Take 0.5 tablets (500 mg total) by mouth 2 (two) times daily with a meal.  60 tablet  3  . rosuvastatin (CRESTOR) 20 MG tablet Take 1 tablet (20 mg total) by mouth daily.  90 tablet  3   No current facility-administered medications  for this visit.   Family History  Problem Relation Age of Onset  . Hypertension Mother   . Hypertension Father   . Coronary artery disease Maternal Aunt   . Diabetes Maternal Aunt   . Coronary artery disease Maternal Uncle   . Diabetes Maternal Uncle   . Diabetes Paternal Aunt   . Coronary artery disease Paternal Aunt   . Diabetes Paternal Uncle   . Coronary artery disease Paternal Uncle    History   Social History  . Marital Status: Legally Separated    Spouse Name: N/A    Number of Children: 0  . Years of Education: 12th   Social History Main Topics  . Smoking status: Current Every Day Smoker -- 0.10 packs/day for 10 years    Types: Cigarettes  . Smokeless tobacco: None  . Alcohol Use: Yes     Comment: 2-3 cans of beer/week  . Drug Use: Yes     Comment: marijuana occassionally   . Sexual Activity: No   Other Topics Concern  . None   Social History Narrative   Does regular exercise.   Has never worked, sister takes care of him    Objective:  Physical Exam: Filed Vitals:   05/02/13 1603  BP: 132/84  Pulse: 63  Temp: 97.7 F (36.5 C)  TempSrc: Oral  Weight: 167 lb 12.8 oz (76.114 kg)  SpO2: 99%   General: appears as stated age HEENT: PERRL, EOMI, no scleral icterus Cardiac: RRR, no rubs, murmurs or gallops Pulm: clear to auscultation bilaterally,  moving normal volumes of air Abd: soft, nontender, nondistended, BS normoactive Ext: warm and well perfused, no pedal edema Neuro: alert and oriented X3, cranial nerves II-XII grossly intact  Assessment & Plan:   Case and care discussed with Dr. Dalphine Handing.  Please see problem oriented charting for further details. Patient to return in 3 months for routine follow up.

## 2013-05-02 NOTE — Assessment & Plan Note (Signed)
Lab Results  Component Value Date   HGBA1C 6.1 05/02/2013   HGBA1C 6.4 06/28/2012   HGBA1C 6.8 03/23/2012     Assessment: Diabetes control:  controlled Progress toward A1C goal:   at goal  Plan: Medications:  continue current medications - metformin (sounds like he is taking 500 daily) Instruction/counseling given: reminded to get eye exam, reminded to bring blood glucose meter & log to each visit and reminded to bring medications to each visit Educational resources provided: brochure

## 2013-05-02 NOTE — Patient Instructions (Signed)
-  Please continue metformin daily  -Please restart Aspirin 325 daily  -Please start Atorvastatin (lipitor) 80 mq daily before bed - if this medication is too expensive, please let us know, as this is important since he has had a stroke  -We have made referrals for a colonoscopy and eye exam  -You decided to opt against a flu shot, tetanus shot and pneumonia shot today - if you change your mind, let us know  Please be sure to bring all of your medications with you to every visit.  Should you have any new or worsening symptoms, please be sure to call the clinic at [phone].

## 2013-05-02 NOTE — Assessment & Plan Note (Signed)
Reinforced need to take ASA daily; crestor was too expensive, not taking statin, prescribed lipitor. CMET & Lipid profile at follow up

## 2013-05-03 NOTE — Progress Notes (Signed)
Case discussed with Dr. Sharda at the time of the visit.  We reviewed the resident's history and exam and pertinent patient test results.  I agree with the assessment, diagnosis, and plan of care documented in the resident's note.    

## 2013-06-26 ENCOUNTER — Encounter: Payer: Self-pay | Admitting: Internal Medicine

## 2013-07-17 ENCOUNTER — Ambulatory Visit (INDEPENDENT_AMBULATORY_CARE_PROVIDER_SITE_OTHER): Payer: Medicare Other

## 2013-07-17 VITALS — BP 164/88 | HR 60 | Resp 12

## 2013-07-17 DIAGNOSIS — E1142 Type 2 diabetes mellitus with diabetic polyneuropathy: Secondary | ICD-10-CM

## 2013-07-17 DIAGNOSIS — L608 Other nail disorders: Secondary | ICD-10-CM

## 2013-07-17 DIAGNOSIS — E1149 Type 2 diabetes mellitus with other diabetic neurological complication: Secondary | ICD-10-CM

## 2013-07-17 DIAGNOSIS — E1140 Type 2 diabetes mellitus with diabetic neuropathy, unspecified: Secondary | ICD-10-CM

## 2013-07-17 NOTE — Progress Notes (Addendum)
   Subjective:    Patient ID: [name] Williams, male    DOB: June 19, 1956, 57 y.o.   MRN: [id number]  HPI Comments: '' toenails trim''     Review of Systems  All other systems reviewed and are negative.       Objective:   Physical Exam    Neurovascular status as follows pedal pulses DP and PT thready at plus one over 4 bilateral. Refill timed 3-5 seconds all digits skin temperature warm actually maybe absent PT pulses bilateral. The skin is very fragile dry and scaly patient not been using a lotion. Nails thick brittle friable criptotic with discoloration and tenderness 1 through 5 bilateral there is fissuring the skin and the heels and lower legs bilateral. Temperature warm to cool bilateral. Neurologically epicritic and proprioceptive sensations grossly diminished on Semmes Weinstein testing to forefoot digits plantar arch and heel. No open wounds or ulcers are identified at this time patient is rigid digital contractures 2 through 5 bilateral.    Assessment & Plan:  Diabetes with peripheral neuropathy and paresthesias. Plan at this time debridement of dystrophic friable mycotic nails x10 reappointed 3 months for continued palliative care in the future as needed. Also stressed the importance of using lotion to his feet every day and he had lotion or cream would be beneficial. Reappointed 3 months for continued palliative care  Alvan Dame DPM

## 2013-07-17 NOTE — Patient Instructions (Signed)
Diabetes and Foot Care Diabetes may cause you to have problems because of poor blood supply (circulation) to your feet and legs. This may cause the skin on your feet to become thinner, break easier, and heal more slowly. Your skin may become dry, and the skin may peel and crack. You may also have nerve damage in your legs and feet causing decreased feeling in them. You may not notice minor injuries to your feet that could lead to infections or more serious problems. Taking care of your feet is one of the most important things you can do for yourself.  HOME CARE INSTRUCTIONS  Wear shoes at all times, even in the house. Do not go barefoot. Bare feet are easily injured.  Check your feet daily for blisters, cuts, and redness. If you cannot see the bottom of your feet, use a mirror or ask someone for help.  Wash your feet with warm water (do not use hot water) and mild soap. Then pat your feet and the areas between your toes until they are completely dry. Do not soak your feet as this can dry your skin.  Apply a moisturizing lotion or petroleum jelly (that does not contain alcohol and is unscented) to the skin on your feet and to dry, brittle toenails. Do not apply lotion between your toes.  Trim your toenails straight across. Do not dig under them or around the cuticle. File the edges of your nails with an emery board or nail file.  Do not cut corns or calluses or try to remove them with medicine.  Wear clean socks or stockings every day. Make sure they are not too tight. Do not wear knee-high stockings since they may decrease blood flow to your legs.  Wear shoes that fit properly and have enough cushioning. To break in new shoes, wear them for just a few hours a day. This prevents you from injuring your feet. Always look in your shoes before you put them on to be sure there are no objects inside.  Do not cross your legs. This may decrease the blood flow to your feet.  If you find a minor scrape,  cut, or break in the skin on your feet, keep it and the skin around it clean and dry. These areas may be cleansed with mild soap and water. Do not cleanse the area with peroxide, alcohol, or iodine.  When you remove an adhesive bandage, be sure not to damage the skin around it.  If you have a wound, look at it several times a day to make sure it is healing.  Do not use heating pads or hot water bottles. They may burn your skin. If you have lost feeling in your feet or legs, you may not know it is happening until it is too late.  Make sure your health care provider performs a complete foot exam at least annually or more often if you have foot problems. Report any cuts, sores, or bruises to your health care provider immediately. SEEK MEDICAL CARE IF:   You have an injury that is not healing.  You have cuts or breaks in the skin.  You have an ingrown nail.  You notice redness on your legs or feet.  You feel burning or tingling in your legs or feet.  You have pain or cramps in your legs and feet.  Your legs or feet are numb.  Your feet always feel cold. SEEK IMMEDIATE MEDICAL CARE IF:   There is increasing redness,   swelling, or pain in or around a wound.  There is a red line that goes up your leg.  Pus is coming from a wound.  You develop a fever or as directed by your health care provider.  You notice a bad smell coming from an ulcer or wound. Document Released: 07/30/2000 Document Revised: 04/04/2013 Document Reviewed: 01/09/2013 Exit[employer] Patient Information 2014 [employer].  

## 2013-07-28 ENCOUNTER — Other Ambulatory Visit: Payer: Self-pay | Admitting: Internal Medicine

## 2013-08-29 ENCOUNTER — Encounter: Payer: Self-pay | Admitting: Internal Medicine

## 2013-08-29 ENCOUNTER — Ambulatory Visit (INDEPENDENT_AMBULATORY_CARE_PROVIDER_SITE_OTHER): Payer: Medicare Other | Admitting: Internal Medicine

## 2013-08-29 VITALS — BP 135/84 | HR 71 | Temp 97.7°F | Wt 166.0 lb

## 2013-08-29 DIAGNOSIS — E119 Type 2 diabetes mellitus without complications: Secondary | ICD-10-CM

## 2013-08-29 DIAGNOSIS — F17200 Nicotine dependence, unspecified, uncomplicated: Secondary | ICD-10-CM

## 2013-08-29 DIAGNOSIS — Z8673 Personal history of transient ischemic attack (TIA), and cerebral infarction without residual deficits: Secondary | ICD-10-CM

## 2013-08-29 DIAGNOSIS — Z1211 Encounter for screening for malignant neoplasm of colon: Secondary | ICD-10-CM

## 2013-08-29 DIAGNOSIS — Z8679 Personal history of other diseases of the circulatory system: Secondary | ICD-10-CM

## 2013-08-29 DIAGNOSIS — Z0000 Encounter for general adult medical examination without abnormal findings: Secondary | ICD-10-CM

## 2013-08-29 HISTORY — DX: Encounter for general adult medical examination without abnormal findings: Z00.00

## 2013-08-29 LAB — LIPID PANEL
CHOL/HDL RATIO: 4.3 ratio
CHOLESTEROL: 125 mg/dL (ref 0–200)
HDL: 29 mg/dL — AB (ref >39)
LDL Cholesterol: 83 mg/dL (ref 0–99)
Triglycerides: 64 mg/dL (ref <150)
VLDL: 13 mg/dL (ref 0–40)

## 2013-08-29 LAB — COMPREHENSIVE METABOLIC PANEL
ALBUMIN: 3.9 g/dL (ref 3.5–5.2)
ALK PHOS: 62 U/L (ref 39–117)
ALT: 11 U/L (ref 0–53)
AST: 17 U/L (ref 0–37)
BILIRUBIN TOTAL: 0.3 mg/dL (ref 0.3–1.2)
BUN: 14 mg/dL (ref 6–23)
CO2: 27 meq/L (ref 19–32)
Calcium: 9.2 mg/dL (ref 8.4–10.5)
Chloride: 106 mEq/L (ref 96–112)
Creat: 1.19 mg/dL (ref 0.50–1.35)
GLUCOSE: 82 mg/dL (ref 70–99)
POTASSIUM: 4.1 meq/L (ref 3.5–5.3)
Sodium: 142 mEq/L (ref 135–145)
Total Protein: 6.5 g/dL (ref 6.0–8.3)

## 2013-08-29 LAB — GLUCOSE, CAPILLARY: GLUCOSE-CAPILLARY: 77 mg/dL (ref 70–99)

## 2013-08-29 LAB — POCT GLYCOSYLATED HEMOGLOBIN (HGB A1C): HEMOGLOBIN A1C: 6.6

## 2013-08-29 MED ORDER — SIMVASTATIN 40 MG PO TABS
40.0000 mg | ORAL_TABLET | Freq: Every evening | ORAL | Status: DC
Start: 2013-08-29 — End: 2014-06-26

## 2013-08-29 NOTE — Patient Instructions (Signed)
General Instructions:  -Continue taking metformin twice a day, as well as aspirin -start simvastatin 40mg  every night before bed -We are referring you for your colonoscopy  Please be sure to bring all of your medications with you to every visit.  Should you have any new or worsening symptoms, please be sure to call the clinic at [phone].   Treatment Goals:  Goals (1 Years of Data) as of 08/29/13         As of Today 07/17/13 05/02/13 06/28/12     Blood Pressure    . Blood Pressure < 130/80  135/84 164/88 132/84 125/81      Progress Toward Treatment Goals:  Treatment Goal 08/29/2013  Hemoglobin A1C at goal  Stop smoking smoking less    Self Care Goals & Plans:  Self Care Goal 08/29/2013  Manage my medications take my medicines as prescribed; bring my medications to every visit  Eat healthy foods -  Be physically active -  Stop smoking go to the [employer] website ([email or web address])  Meeting treatment goals maintain the current self-care plan    Home Blood Glucose Monitoring 08/29/2013  Check my blood sugar no home glucose monitoring

## 2013-08-29 NOTE — Assessment & Plan Note (Signed)
Compliant with ASA - statin (lipitor & crestor) were too expensive, so stopped taking - I sent in simvastatin 40 qHS

## 2013-08-29 NOTE — Assessment & Plan Note (Signed)
  Assessment: Progress toward smoking cessation:  smoking less Barriers to progress toward smoking cessation:  withdrawal symptoms  Plan: Instruction/counseling given:  I counseled patient on the dangers of tobacco use, advised patient to stop smoking, and reviewed strategies to maximize success. Medications to assist with smoking cessation:  None Patient agreed to the following self-care plans for smoking cessation: go to the [employer] website ([email or web address])

## 2013-08-29 NOTE — Assessment & Plan Note (Signed)
Declines all vaccinations. Agreeable to colonoscopy.

## 2013-08-29 NOTE — Assessment & Plan Note (Addendum)
Lab Results  Component Value Date   HGBA1C 6.6 08/29/2013   HGBA1C 6.1 05/02/2013   HGBA1C 6.4 06/28/2012     Assessment: Diabetes control: good control (HgbA1C at goal) Progress toward A1C goal:  at goal  Plan: Medications:  continue current medications - metformin 500 bid Home glucose monitoring: Frequency: no home glucose monitoring Instruction/counseling given: reminded to bring medications to each visit Lipid and urine microalb today

## 2013-08-29 NOTE — Progress Notes (Signed)
Subjective:   Patient ID: [name] Williams male   DOB: 02/10/1956 58 y.o.   MRN: [id number]  Chief Complaint  Patient presents with  . Diabetes    routine follow up    HPI: [name] Williams is a 58 y.o. man with h/o DM, HLD, tobacco abuse and h/o CVA who presents for routine DM follow up.   He refuses tdap, flu and pneumonia vaccines.  No complaints today Trying to quit smoking, down to 1 pack per week  Today: lipid, urine, A1c  Review of Systems: Constitutional: Denies fever, chills, diaphoresis, appetite change and fatigue.  HEENT: Denies photophobia, eye pain, redness, hearing loss, ear pain, congestion, sore throat, rhinorrhea, sneezing, mouth sores, trouble swallowing, neck pain, neck stiffness and tinnitus.  Respiratory: Denies SOB, DOE, cough, chest tightness, and wheezing.  Cardiovascular: Denies chest pain, palpitations and leg swelling.  Gastrointestinal: Denies nausea, vomiting, abdominal pain, diarrhea, constipation,blood in stool and abdominal distention.  Genitourinary: Denies dysuria, urgency, frequency, hematuria, flank pain and difficulty urinating.  Musculoskeletal: Denies myalgias, back pain, joint swelling, arthralgias and gait problem.  Skin: Denies pallor, rash and wound.  Neurological: Denies dizziness, seizures, syncope, weakness, lightheadedness, numbness and headaches.    Past Medical History  Diagnosis Date  . Diabetes mellitus   . Hypertension   . History of stroke    Current Outpatient Prescriptions  Medication Sig Dispense Refill  . haloperidol lactate (HALDOL) 5 MG/ML injection every 6 (six) hours as needed. Unsure of dosing, pt gets from mental health q 4 weeks chronically.       . metFORMIN (GLUCOPHAGE) 1000 MG tablet TAKE 0.5 TABLETS (500 MG TOTAL) BY MOUTH 2 (TWO) TIMES DAILY WITH A MEAL.  60 tablet  3  . aspirin 325 MG EC tablet Take 325 mg by mouth daily.        Marland Kitchen. atorvastatin (LIPITOR) 80 MG tablet Take 1 tablet (80 mg total) by mouth  daily.  30 tablet  11   No current facility-administered medications for this visit.   Family History  Problem Relation Age of Onset  . Hypertension Mother   . Hypertension Father   . Coronary artery disease Maternal Aunt   . Diabetes Maternal Aunt   . Coronary artery disease Maternal Uncle   . Diabetes Maternal Uncle   . Diabetes Paternal Aunt   . Coronary artery disease Paternal Aunt   . Diabetes Paternal Uncle   . Coronary artery disease Paternal Uncle    History   Social History  . Marital Status: Legally Separated    Spouse Name: N/A    Number of Children: 0  . Years of Education: 12th   Social History Main Topics  . Smoking status: Current Every Day Smoker -- 0.10 packs/day for 10 years    Types: Cigarettes  . Smokeless tobacco: None  . Alcohol Use: Yes     Comment: 2-3 cans of beer/week  . Drug Use: Yes     Comment: marijuana occassionally   . Sexual Activity: No   Other Topics Concern  . None   Social History Narrative   Does regular exercise.   Has never worked, sister takes care of him    Objective:  Physical Exam: Filed Vitals:   08/29/13 1559  BP: 135/84  Pulse: 71  Temp: 97.7 F (36.5 C)  TempSrc: Oral  Weight: 166 lb (75.297 kg)  SpO2: 98%   HEENT: pin point pupils, EOMI, no scleral icterus Cardiac: RRR, no rubs, murmurs or gallops Pulm: clear  to auscultation bilaterally, moving normal volumes of air Abd: soft, nontender, nondistended, BS present Ext: warm and well perfused, no pedal edema Neuro: alert and oriented X3, cranial nerves II-XII grossly intact, 5/5 b/l UE & LE strength, magnetic gait of left foot  Assessment & Plan:  Case and care discussed with Dr. Meredith Pel.  Please see problem oriented charting for further details. Patient to return in 3 months for routine DM follow up.

## 2013-08-30 LAB — MICROALBUMIN / CREATININE URINE RATIO
Creatinine, Urine: 223.9 mg/dL
Microalb Creat Ratio: 3.8 mg/g (ref 0.0–30.0)
Microalb, Ur: 0.84 mg/dL (ref 0.00–1.89)

## 2013-08-30 NOTE — Progress Notes (Signed)
Case discussed with Dr. Sharda soon after the resident saw the patient.  We reviewed the resident's history and exam and pertinent patient test results.  I agree with the assessment, diagnosis, and plan of care documented in the resident's note. 

## 2013-10-16 ENCOUNTER — Ambulatory Visit (INDEPENDENT_AMBULATORY_CARE_PROVIDER_SITE_OTHER): Payer: Medicare Other

## 2013-10-16 VITALS — BP 177/88 | HR 74 | Resp 16

## 2013-10-16 DIAGNOSIS — E1140 Type 2 diabetes mellitus with diabetic neuropathy, unspecified: Secondary | ICD-10-CM

## 2013-10-16 DIAGNOSIS — L608 Other nail disorders: Secondary | ICD-10-CM

## 2013-10-16 DIAGNOSIS — E1149 Type 2 diabetes mellitus with other diabetic neurological complication: Secondary | ICD-10-CM

## 2013-10-16 DIAGNOSIS — E1142 Type 2 diabetes mellitus with diabetic polyneuropathy: Secondary | ICD-10-CM

## 2013-10-16 NOTE — Patient Instructions (Signed)
Diabetes and Foot Care Diabetes may cause you to have problems because of poor blood supply (circulation) to your feet and legs. This may cause the skin on your feet to become thinner, break easier, and heal more slowly. Your skin may become dry, and the skin may peel and crack. You may also have nerve damage in your legs and feet causing decreased feeling in them. You may not notice minor injuries to your feet that could lead to infections or more serious problems. Taking care of your feet is one of the most important things you can do for yourself.  HOME CARE INSTRUCTIONS  Wear shoes at all times, even in the house. Do not go barefoot. Bare feet are easily injured.  Check your feet daily for blisters, cuts, and redness. If you cannot see the bottom of your feet, use a mirror or ask someone for help.  Wash your feet with warm water (do not use hot water) and mild soap. Then pat your feet and the areas between your toes until they are completely dry. Do not soak your feet as this can dry your skin.  Apply a moisturizing lotion or petroleum jelly (that does not contain alcohol and is unscented) to the skin on your feet and to dry, brittle toenails. Do not apply lotion between your toes.  Trim your toenails straight across. Do not dig under them or around the cuticle. File the edges of your nails with an emery board or nail file.  Do not cut corns or calluses or try to remove them with medicine.  Wear clean socks or stockings every day. Make sure they are not too tight. Do not wear knee-high stockings since they may decrease blood flow to your legs.  Wear shoes that fit properly and have enough cushioning. To break in new shoes, wear them for just a few hours a day. This prevents you from injuring your feet. Always look in your shoes before you put them on to be sure there are no objects inside.  Do not cross your legs. This may decrease the blood flow to your feet.  If you find a minor scrape,  cut, or break in the skin on your feet, keep it and the skin around it clean and dry. These areas may be cleansed with mild soap and water. Do not cleanse the area with peroxide, alcohol, or iodine.  When you remove an adhesive bandage, be sure not to damage the skin around it.  If you have a wound, look at it several times a day to make sure it is healing.  Do not use heating pads or hot water bottles. They may burn your skin. If you have lost feeling in your feet or legs, you may not know it is happening until it is too late.  Make sure your health care provider performs a complete foot exam at least annually or more often if you have foot problems. Report any cuts, sores, or bruises to your health care provider immediately. SEEK MEDICAL CARE IF:   You have an injury that is not healing.  You have cuts or breaks in the skin.  You have an ingrown nail.  You notice redness on your legs or feet.  You feel burning or tingling in your legs or feet.  You have pain or cramps in your legs and feet.  Your legs or feet are numb.  Your feet always feel cold. SEEK IMMEDIATE MEDICAL CARE IF:   There is increasing redness,   swelling, or pain in or around a wound.  There is a red line that goes up your leg.  Pus is coming from a wound.  You develop a fever or as directed by your health care provider.  You notice a bad smell coming from an ulcer or wound. Document Released: 07/30/2000 Document Revised: 04/04/2013 Document Reviewed: 01/09/2013 Exit[employer] Patient Information 2014 [employer].  

## 2013-10-16 NOTE — Progress Notes (Signed)
   Subjective:    Patient ID: [name] Williams, male    DOB: 15-Aug-1956, 58 y.o.   MRN: [id number]  HPI Comments: "Cut my toenails"     Review of Systems no new changes or findings     Objective:   Physical Exam Neurovascular status is intact DP and PT plus one over 4 bilateral thready Refill timed 3-5 seconds all digits temperature cool patient is dry skin thick brittle crumbly friable criptotic nails painful tender symptomatic and debridement on palpation patient does have neuropathy is not doing his diabetes testing at current time. Again diabetes with complications and neuropathy thick brittle dystrophic nails debridement presence of diabetes and complications at this time to       Assessment & Plan:  No secondary infections in diabetes with peripheral neuropathy as well as dystrophic criptotic informed ingrowing nails 1 through 5 bilateral debridement at this time return for future palliative care and as-needed basis suggest 3 month followup recommend using lotion for his dry skin on a daily basis at all times.  Alvan Dameichard Dahlia Nifong DPM

## 2013-10-23 ENCOUNTER — Telehealth: Payer: Self-pay

## 2013-12-20 ENCOUNTER — Encounter: Payer: Self-pay | Admitting: Internal Medicine

## 2013-12-20 ENCOUNTER — Ambulatory Visit (INDEPENDENT_AMBULATORY_CARE_PROVIDER_SITE_OTHER): Payer: Medicare Other | Admitting: Internal Medicine

## 2013-12-20 VITALS — BP 136/80 | HR 58 | Temp 97.0°F | Ht 70.0 in | Wt 160.8 lb

## 2013-12-20 DIAGNOSIS — F17200 Nicotine dependence, unspecified, uncomplicated: Secondary | ICD-10-CM

## 2013-12-20 DIAGNOSIS — Z0000 Encounter for general adult medical examination without abnormal findings: Secondary | ICD-10-CM

## 2013-12-20 DIAGNOSIS — E119 Type 2 diabetes mellitus without complications: Secondary | ICD-10-CM

## 2013-12-20 LAB — POCT GLYCOSYLATED HEMOGLOBIN (HGB A1C): Hemoglobin A1C: 6.2

## 2013-12-20 LAB — GLUCOSE, CAPILLARY: GLUCOSE-CAPILLARY: 101 mg/dL — AB (ref 70–99)

## 2013-12-20 NOTE — Progress Notes (Signed)
Subjective:   Patient ID: Cleotilde NeerJames Harb male   DOB: 08/14/1956 58 y.o.   MRN: [id number]  Chief Complaint  Patient presents with  . Follow-up    Doing well.     HPI: Mr.Johndaniel Vale is a 58 y.o. man with h/o DM, HTN, CVA who presents for routine.    Continues to decline tdap & PNA vaccines. No complaints again. Plans to quit smoking in 2 more weeks, trying to cut back  Reports he is taking only diabetes medication, not ASA or statin - no particular reason why.  Review of Systems:  Constitutional: Denies fever, chills, diaphoresis, appetite change and fatigue.  HEENT: Denies photophobia, eye pain, redness, hearing loss, ear pain, congestion, sore throat, rhinorrhea, sneezing, mouth sores, trouble swallowing, neck pain, neck stiffness and tinnitus.  Respiratory: Denies SOB, DOE, cough, chest tightness, and wheezing.  Cardiovascular: Denies chest pain, palpitations and leg swelling.  Gastrointestinal: Denies nausea, vomiting, abdominal pain, diarrhea, constipation,blood in stool and abdominal distention.  Genitourinary: Denies dysuria, urgency, frequency, hematuria, flank pain and difficulty urinating.  Musculoskeletal: Denies myalgias, back pain, joint swelling, arthralgias and gait problem.  Skin: Denies pallor, rash and wound.  Neurological: Denies dizziness, seizures, syncope, weakness, lightheadedness, numbness and headaches.     Past Medical History  Diagnosis Date  . Diabetes mellitus   . Hypertension   . History of stroke    Current Outpatient Prescriptions  Medication Sig Dispense Refill  . aspirin 325 MG EC tablet Take 325 mg by mouth daily.        . haloperidol lactate (HALDOL) 5 MG/ML injection every 6 (six) hours as needed. Unsure of dosing, pt gets from mental health q 4 weeks chronically.       . metFORMIN (GLUCOPHAGE) 1000 MG tablet TAKE 0.5 TABLETS (500 MG TOTAL) BY MOUTH 2 (TWO) TIMES DAILY WITH A MEAL.  60 tablet  3  . simvastatin (ZOCOR) 40 MG tablet  Take 1 tablet (40 mg total) by mouth every evening.  30 tablet  11   No current facility-administered medications for this visit.   Family History  Problem Relation Age of Onset  . Hypertension Mother   . Hypertension Father   . Coronary artery disease Maternal Aunt   . Diabetes Maternal Aunt   . Coronary artery disease Maternal Uncle   . Diabetes Maternal Uncle   . Diabetes Paternal Aunt   . Coronary artery disease Paternal Aunt   . Diabetes Paternal Uncle   . Coronary artery disease Paternal Uncle    History   Social History  . Marital Status: Legally Separated    Spouse Name: N/A    Number of Children: 0  . Years of Education: 12th   Social History Main Topics  . Smoking status: Current Every Day Smoker -- 0.10 packs/day for 10 years    Types: Cigarettes  . Smokeless tobacco: None  . Alcohol Use: Yes     Comment: 2-3 cans of beer/week  . Drug Use: Yes     Comment: marijuana occassionally   . Sexual Activity: No   Other Topics Concern  . None   Social History Narrative   Does regular exercise.   Has never worked, sister takes care of him    Objective:  Physical Exam: Filed Vitals:   12/20/13 1603  BP: 136/80  Pulse: 58  Temp: 97 F (36.1 C)  TempSrc: Oral  Height: 5\' 10"  (1.778 m)  Weight: 160 lb 12.8 oz (72.938 kg)  SpO2: 99%  General: no distress, smells like smoke HEENT: PERRL, EOMI, no scleral icterus Cardiac: RRR, no rubs, murmurs or gallops Pulm: clear to auscultation bilaterally, moving normal volumes of air Abd: soft, nontender, nondistended, BS present Ext: warm and well perfused, no pedal edema Neuro: alert and oriented X3, cranial nerves II-XII grossly intact, slurred speech at baseline  Assessment & Plan:  Case and care discussed with Dr. Cyndie ChimeGranfortuna.  Please see problem oriented charting for further details. Patient to return in 6 months for routine.

## 2013-12-20 NOTE — Patient Instructions (Signed)
General Instructions:  - Your diabetes is doing great!! Keep it up!  - Everyday, you should be taking metformin, aspirin 325mg  and simvastatin (this medication is better taken at night)  - Don't forget to schedule your colonoscopy  Please try to bring all your medicines next time. This helps us take good care of you and stops mistakes from medicines that could hurt you.  Should you have any new or worsening symptoms, please be sure to call the clinic at [phone].   Treatment Goals:  Goals (1 Years of Data) as of 12/20/13         As of Today 10/16/13 08/29/13 07/17/13 05/02/13     Blood Pressure    . Blood Pressure < 130/80  136/80 177/88 135/84 164/88 132/84      Progress Toward Treatment Goals:  Treatment Goal 12/20/2013  Hemoglobin A1C at goal  Stop smoking smoking less    Self Care Goals & Plans:  Self Care Goal 12/20/2013  Manage my medications take my medicines as prescribed; bring my medications to every visit; refill my medications on time; follow the sick day instructions if I am sick  Monitor my health check my feet daily  Eat healthy foods eat more vegetables; eat fruit for snacks and desserts; eat foods that are low in salt; drink diet soda or water instead of juice or soda  Be physically active find an activity I enjoy  Stop smoking go to the [employer] website ([email or web address]eewww.[email or web address])  Meeting treatment goals maintain the current self-care plan    Home Blood Glucose Monitoring 12/20/2013  Check my blood sugar no home glucose monitoring

## 2013-12-21 NOTE — Assessment & Plan Note (Signed)
Again declines vaccines. Reminded to schedule colonoscopy

## 2013-12-21 NOTE — Progress Notes (Signed)
Attending physician note: Presenting problem, physical findings, and medications reviewed with resident physician Dr. Stacy GardnerNeema Sharda and I concur with her management plan. We will continue current regimen pending neurology followup. The patient is currently asymptomatic. Cephas DarbyJames Haidyn Chadderdon, M.D., FACP

## 2013-12-21 NOTE — Assessment & Plan Note (Signed)
  Assessment: Progress toward smoking cessation:  smoking less Barriers to progress toward smoking cessation:  withdrawal symptoms;lack of motivation to quit  Plan: Instruction/counseling given:  I counseled patient on the dangers of tobacco use, advised patient to stop smoking, and reviewed strategies to maximize success. Educational resources provided:  [employer] (1-800-QUIT-NOW) brochure Self management tools provided:    Medications to assist with smoking cessation:  None Patient agreed to the following self-care plans for smoking cessation: go to the [employer] website ([email or web address].[email or web address])

## 2013-12-21 NOTE — Assessment & Plan Note (Signed)
Lab Results  Component Value Date   HGBA1C 6.2 12/20/2013   HGBA1C 6.6 08/29/2013   HGBA1C 6.1 05/02/2013     Assessment: Diabetes control: good control (HgbA1C at goal) Progress toward A1C goal:  at goal  Plan: Medications:  continue current medications - metformin 500 bid Home glucose monitoring: Frequency: no home glucose monitoring Timing:   Instruction/counseling given: reminded to bring medications to each visit Educational resources provided: brochure;handout

## 2014-01-18 ENCOUNTER — Ambulatory Visit: Payer: Medicare Other

## 2014-01-22 ENCOUNTER — Ambulatory Visit (INDEPENDENT_AMBULATORY_CARE_PROVIDER_SITE_OTHER): Payer: Medicare Other

## 2014-01-22 VITALS — BP 168/94 | HR 77 | Resp 12

## 2014-01-22 DIAGNOSIS — E1149 Type 2 diabetes mellitus with other diabetic neurological complication: Secondary | ICD-10-CM

## 2014-01-22 DIAGNOSIS — L608 Other nail disorders: Secondary | ICD-10-CM

## 2014-01-22 DIAGNOSIS — E1142 Type 2 diabetes mellitus with diabetic polyneuropathy: Secondary | ICD-10-CM

## 2014-01-22 DIAGNOSIS — R234 Changes in skin texture: Secondary | ICD-10-CM

## 2014-01-22 DIAGNOSIS — L988 Other specified disorders of the skin and subcutaneous tissue: Secondary | ICD-10-CM

## 2014-01-22 DIAGNOSIS — E1140 Type 2 diabetes mellitus with diabetic neuropathy, unspecified: Secondary | ICD-10-CM

## 2014-01-22 NOTE — Progress Notes (Signed)
   Subjective:    Patient ID: [name] Williams, male    DOB: [birth year], 58 y.o.   MRN: [id number]  HPI''TOENAILS TRIM.''   Review of Systems no new findings or systemic changes noted     Objective:   Physical Exam Vascular status is intact pedal pulses DP and PT plus one over 4 bilateral Refill timed 3-4 seconds all digits temperature is cool skin is dry thick brittle crumbly and friable patient would washing his feet every 2-3 weeks. It is not always have access has been recently with his family at this time dispensed him a for hexedine scrub brushes for patient to use daily every other day with covering the foot and multiple samples of lotion urea cream are given to patient for his use to improve hygiene. There no open wounds ulcerations no secondary infections nails thick brittle crumbly criptotic incurvated 1 through 5 bilateral patient does have diabetes with neuropathy decreased epicritic and proprioceptive sensations tall digits forefoot and plantar arch. There is normal plantar response DTRs not listed       Assessment & Plan:  Assessment diabetes with neuropathy. And posse mild angiopathy. Dry scaling skin is cleansed this time dispensed a cleansing and lotion for better hygiene with instructions for use. Patient will recheck within 3 months for followup and continued diabetic foot and nail care nails painful mycotic criptotic deformed ingrowing nails 1 through 5 bilateral debridement at this time maintain a coming shoes and recommend socks all times in the shoes again no signs of infection noted however there is suggestion concerns about patients overall hygiene and scrub brushes are dispensed at this time and lotion for him to use as instructed  Alvan Dame DPM

## 2014-01-22 NOTE — Patient Instructions (Signed)
Diabetes and Foot Care Diabetes may cause you to have problems because of poor blood supply (circulation) to your feet and legs. This may cause the skin on your feet to become thinner, break easier, and heal more slowly. Your skin may become dry, and the skin may peel and crack. You may also have nerve damage in your legs and feet causing decreased feeling in them. You may not notice minor injuries to your feet that could lead to infections or more serious problems. Taking care of your feet is one of the most important things you can do for yourself.  HOME CARE INSTRUCTIONS  Wear shoes at all times, even in the house. Do not go barefoot. Bare feet are easily injured.  Check your feet daily for blisters, cuts, and redness. If you cannot see the bottom of your feet, use a mirror or ask someone for help.  Wash your feet with warm water (do not use hot water) and mild soap. Then pat your feet and the areas between your toes until they are completely dry. Do not soak your feet as this can dry your skin.  Apply a moisturizing lotion or petroleum jelly (that does not contain alcohol and is unscented) to the skin on your feet and to dry, brittle toenails. Do not apply lotion between your toes.  Trim your toenails straight across. Do not dig under them or around the cuticle. File the edges of your nails with an emery board or nail file.  Do not cut corns or calluses or try to remove them with medicine.  Wear clean socks or stockings every day. Make sure they are not too tight. Do not wear knee-high stockings since they may decrease blood flow to your legs.  Wear shoes that fit properly and have enough cushioning. To break in new shoes, wear them for just a few hours a day. This prevents you from injuring your feet. Always look in your shoes before you put them on to be sure there are no objects inside.  Do not cross your legs. This may decrease the blood flow to your feet.  If you find a minor scrape,  cut, or break in the skin on your feet, keep it and the skin around it clean and dry. These areas may be cleansed with mild soap and water. Do not cleanse the area with peroxide, alcohol, or iodine.  When you remove an adhesive bandage, be sure not to damage the skin around it.  If you have a wound, look at it several times a day to make sure it is healing.  Do not use heating pads or hot water bottles. They may burn your skin. If you have lost feeling in your feet or legs, you may not know it is happening until it is too late.  Make sure your health care provider performs a complete foot exam at least annually or more often if you have foot problems. Report any cuts, sores, or bruises to your health care provider immediately. SEEK MEDICAL CARE IF:   You have an injury that is not healing.  You have cuts or breaks in the skin.  You have an ingrown nail.  You notice redness on your legs or feet.  You feel burning or tingling in your legs or feet.  You have pain or cramps in your legs and feet.  Your legs or feet are numb.  Your feet always feel cold. SEEK IMMEDIATE MEDICAL CARE IF:   There is increasing redness,   swelling, or pain in or around a wound.  There is a red line that goes up your leg.  Pus is coming from a wound.  You develop a fever or as directed by your health care provider.  You notice a bad smell coming from an ulcer or wound. Document Released: 07/30/2000 Document Revised: 04/04/2013 Document Reviewed: 01/09/2013 [facility] Patient Information 2014 [place], [place].  Recommendations for a very dry and scaling skin. Your supplied with 3 soap filled antibacterial soap filled brushes use these describe your feet once daily or every other day for the next several days. After scrubbing thoroughly dried the feet and apply lotion to all areas except between toes. Recommendation is that the feet should be washed are clean with a good scrubbing at least every other day. And  maintain a lotion as recommended. Also cotton or Kerlix socks with shoes or slippers at all times

## 2014-04-01 ENCOUNTER — Telehealth: Payer: Self-pay | Admitting: Dietician

## 2014-04-05 NOTE — Telephone Encounter (Signed)
Left messages for patient about getting his annual diabetes eye exam done.

## 2014-04-23 ENCOUNTER — Ambulatory Visit: Payer: Medicare Other

## 2014-04-30 ENCOUNTER — Ambulatory Visit (INDEPENDENT_AMBULATORY_CARE_PROVIDER_SITE_OTHER): Payer: Medicare Other

## 2014-04-30 DIAGNOSIS — E1140 Type 2 diabetes mellitus with diabetic neuropathy, unspecified: Secondary | ICD-10-CM

## 2014-04-30 DIAGNOSIS — L608 Other nail disorders: Secondary | ICD-10-CM

## 2014-04-30 DIAGNOSIS — E1149 Type 2 diabetes mellitus with other diabetic neurological complication: Secondary | ICD-10-CM

## 2014-04-30 NOTE — Patient Instructions (Signed)
Diabetes and Foot Care Diabetes may cause you to have problems because of poor blood supply (circulation) to your feet and legs. This may cause the skin on your feet to become thinner, break easier, and heal more slowly. Your skin may become dry, and the skin may peel and crack. You may also have nerve damage in your legs and feet causing decreased feeling in them. You may not notice minor injuries to your feet that could lead to infections or more serious problems. Taking care of your feet is one of the most important things you can do for yourself.  HOME CARE INSTRUCTIONS  Wear shoes at all times, even in the house. Do not go barefoot. Bare feet are easily injured.  Check your feet daily for blisters, cuts, and redness. If you cannot see the bottom of your feet, use a mirror or ask someone for help.  Wash your feet with warm water (do not use hot water) and mild soap. Then pat your feet and the areas between your toes until they are completely dry. Do not soak your feet as this can dry your skin.  Apply a moisturizing lotion or petroleum jelly (that does not contain alcohol and is unscented) to the skin on your feet and to dry, brittle toenails. Do not apply lotion between your toes.  Trim your toenails straight across. Do not dig under them or around the cuticle. File the edges of your nails with an emery board or nail file.  Do not cut corns or calluses or try to remove them with medicine.  Wear clean socks or stockings every day. Make sure they are not too tight. Do not wear knee-high stockings since they may decrease blood flow to your legs.  Wear shoes that fit properly and have enough cushioning. To break in new shoes, wear them for just a few hours a day. This prevents you from injuring your feet. Always look in your shoes before you put them on to be sure there are no objects inside.  Do not cross your legs. This may decrease the blood flow to your feet.  If you find a minor scrape,  cut, or break in the skin on your feet, keep it and the skin around it clean and dry. These areas may be cleansed with mild soap and water. Do not cleanse the area with peroxide, alcohol, or iodine.  When you remove an adhesive bandage, be sure not to damage the skin around it.  If you have a wound, look at it several times a day to make sure it is healing.  Do not use heating pads or hot water bottles. They may burn your skin. If you have lost feeling in your feet or legs, you may not know it is happening until it is too late.  Make sure your health care provider performs a complete foot exam at least annually or more often if you have foot problems. Report any cuts, sores, or bruises to your health care provider immediately. SEEK MEDICAL CARE IF:   You have an injury that is not healing.  You have cuts or breaks in the skin.  You have an ingrown nail.  You notice redness on your legs or feet.  You feel burning or tingling in your legs or feet.  You have pain or cramps in your legs and feet.  Your legs or feet are numb.  Your feet always feel cold. SEEK IMMEDIATE MEDICAL CARE IF:   There is increasing redness,   swelling, or pain in or around a wound.  There is a red line that goes up your leg.  Pus is coming from a wound.  You develop a fever or as directed by your health care provider.  You notice a bad smell coming from an ulcer or wound. Document Released: 07/30/2000 Document Revised: 04/04/2013 Document Reviewed: 01/09/2013 [employer] Patient Information 2015 [employer]. This information is not intended to replace advice given to you by your health care provider. Make sure you discuss any questions you have with your health care provider.  

## 2014-04-30 NOTE — Progress Notes (Signed)
   Subjective:    Patient ID: [name] Williams, male    DOB: 10/30/1955, 58 y.o.   MRN: [id number]  HPI patient presents at this time for diabetic foot and nail care    Review of Systems no new systemic changes or findings are noted     Objective:   Physical Exam 58 year old [place] American male well-developed well-nourished most part oriented although does not be. Best of health or overall hygiene is feet are black from about have not been washed over a week odor noted no open wounds no ulcers no secondary infections pedal pulses DP PT plus one over 4 bilateral capillary refill time 4 seconds epicritic sensations diminished on Semmes Weinstein to forefoot digits there is some dry scaling skin. Dermatologically skin color pigment normal hair growth absent again previous he gave him some lotions and foot care areas patient does not always have is to wash his feet apparently didn't open wounds no secondary infections nails thick brittle crumbly friable dystrophic discolored 1 through 5 bilateral       Assessment & Plan:  Assessment this time diabetes with history peripheral neuropathy and angiopathy. Tries fissured skin is noted however no open wounds no ulcers nails thick brittle crumbly dystrophic friable 1 through 5 bilateral debrided this time and the presence of diabetes and complications recommended socks and shoes at all times is wearing socks however has not apparently not washed his feet in over one week. recheck in 3 months for continued palliative care in the future as needed  Alvan Dame DPM

## 2014-06-03 ENCOUNTER — Other Ambulatory Visit: Payer: Self-pay | Admitting: *Deleted

## 2014-06-03 NOTE — Telephone Encounter (Signed)
Pt has an appt scheduled 07/17/14.

## 2014-06-04 MED ORDER — METFORMIN HCL 1000 MG PO TABS: ORAL_TABLET | ORAL | Status: DC

## 2014-06-23 ENCOUNTER — Inpatient Hospital Stay (HOSPITAL_COMMUNITY)
Admission: EM | Admit: 2014-06-23 | Discharge: 2014-06-26 | DRG: 065 | Disposition: A | Discharge status: Rehabilitation Facility | Payer: Medicare Other | Attending: Oncology | Admitting: Oncology

## 2014-06-23 ENCOUNTER — Encounter (HOSPITAL_COMMUNITY): Payer: Self-pay | Admitting: *Deleted

## 2014-06-23 ENCOUNTER — Emergency Department (HOSPITAL_COMMUNITY): Payer: Medicare Other

## 2014-06-23 ENCOUNTER — Inpatient Hospital Stay (HOSPITAL_COMMUNITY): Payer: Medicare Other

## 2014-06-23 DIAGNOSIS — I69351 Hemiplegia and hemiparesis following cerebral infarction affecting right dominant side: Secondary | ICD-10-CM

## 2014-06-23 DIAGNOSIS — F209 Schizophrenia, unspecified: Secondary | ICD-10-CM | POA: Diagnosis not present

## 2014-06-23 DIAGNOSIS — I679 Cerebrovascular disease, unspecified: Secondary | ICD-10-CM

## 2014-06-23 DIAGNOSIS — I639 Cerebral infarction, unspecified: Secondary | ICD-10-CM | POA: Diagnosis present

## 2014-06-23 DIAGNOSIS — H532 Diplopia: Secondary | ICD-10-CM

## 2014-06-23 DIAGNOSIS — F17200 Nicotine dependence, unspecified, uncomplicated: Secondary | ICD-10-CM

## 2014-06-23 DIAGNOSIS — Z720 Tobacco use: Secondary | ICD-10-CM

## 2014-06-23 DIAGNOSIS — Z8673 Personal history of transient ischemic attack (TIA), and cerebral infarction without residual deficits: Secondary | ICD-10-CM

## 2014-06-23 DIAGNOSIS — R2990 Unspecified symptoms and signs involving the nervous system: Secondary | ICD-10-CM

## 2014-06-23 DIAGNOSIS — Z79899 Other long term (current) drug therapy: Secondary | ICD-10-CM | POA: Diagnosis not present

## 2014-06-23 DIAGNOSIS — R531 Weakness: Secondary | ICD-10-CM | POA: Diagnosis present

## 2014-06-23 DIAGNOSIS — E785 Hyperlipidemia, unspecified: Secondary | ICD-10-CM

## 2014-06-23 DIAGNOSIS — I739 Peripheral vascular disease, unspecified: Secondary | ICD-10-CM | POA: Diagnosis not present

## 2014-06-23 DIAGNOSIS — R42 Dizziness and giddiness: Secondary | ICD-10-CM

## 2014-06-23 DIAGNOSIS — E1142 Type 2 diabetes mellitus with diabetic polyneuropathy: Secondary | ICD-10-CM | POA: Diagnosis present

## 2014-06-23 DIAGNOSIS — E119 Type 2 diabetes mellitus without complications: Secondary | ICD-10-CM

## 2014-06-23 DIAGNOSIS — I6523 Occlusion and stenosis of bilateral carotid arteries: Secondary | ICD-10-CM | POA: Diagnosis not present

## 2014-06-23 DIAGNOSIS — Z7982 Long term (current) use of aspirin: Secondary | ICD-10-CM

## 2014-06-23 DIAGNOSIS — Z8249 Family history of ischemic heart disease and other diseases of the circulatory system: Secondary | ICD-10-CM | POA: Diagnosis not present

## 2014-06-23 DIAGNOSIS — I10 Essential (primary) hypertension: Secondary | ICD-10-CM | POA: Diagnosis not present

## 2014-06-23 DIAGNOSIS — F17210 Nicotine dependence, cigarettes, uncomplicated: Secondary | ICD-10-CM | POA: Diagnosis present

## 2014-06-23 HISTORY — DX: Cerebrovascular disease, unspecified: I67.9

## 2014-06-23 HISTORY — DX: Unspecified symptoms and signs involving the nervous system: R29.90

## 2014-06-23 LAB — COMPREHENSIVE METABOLIC PANEL
ALBUMIN: 3.5 g/dL (ref 3.5–5.2)
ALK PHOS: 54 U/L (ref 39–117)
ALT: 15 U/L (ref 0–53)
AST: 23 U/L (ref 0–37)
Anion gap: 12 (ref 5–15)
BUN: 12 mg/dL (ref 6–23)
CO2: 26 mEq/L (ref 19–32)
Calcium: 9.5 mg/dL (ref 8.4–10.5)
Chloride: 102 mEq/L (ref 96–112)
Creatinine, Ser: 1.12 mg/dL (ref 0.50–1.35)
GFR calc Af Amer: 82 mL/min — ABNORMAL LOW (ref >90)
GFR calc non Af Amer: 71 mL/min — ABNORMAL LOW (ref >90)
GLUCOSE: 100 mg/dL — AB (ref 70–99)
POTASSIUM: 4.1 meq/L (ref 3.7–5.3)
SODIUM: 140 meq/L (ref 137–147)
TOTAL PROTEIN: 7.3 g/dL (ref 6.0–8.3)
Total Bilirubin: 0.3 mg/dL (ref 0.3–1.2)

## 2014-06-23 LAB — CBC WITH DIFFERENTIAL/PLATELET
BASOS PCT: 1 % (ref 0–1)
Basophils Absolute: 0.1 10*3/uL (ref 0.0–0.1)
Eosinophils Absolute: 0.4 10*3/uL (ref 0.0–0.7)
Eosinophils Relative: 5 % (ref 0–5)
HCT: 39.1 % (ref 39.0–52.0)
HEMOGLOBIN: 12.7 g/dL — AB (ref 13.0–17.0)
Lymphocytes Relative: 33 % (ref 12–46)
Lymphs Abs: 2.3 10*3/uL (ref 0.7–4.0)
MCH: 28 pg (ref 26.0–34.0)
MCHC: 32.5 g/dL (ref 30.0–36.0)
MCV: 86.3 fL (ref 78.0–100.0)
MONO ABS: 0.7 10*3/uL (ref 0.1–1.0)
Monocytes Relative: 10 % (ref 3–12)
NEUTROS ABS: 3.6 10*3/uL (ref 1.7–7.7)
Neutrophils Relative %: 51 % (ref 43–77)
Platelets: 188 10*3/uL (ref 150–400)
RBC: 4.53 MIL/uL (ref 4.22–5.81)
RDW: 14.5 % (ref 11.5–15.5)
WBC: 7.1 10*3/uL (ref 4.0–10.5)

## 2014-06-23 LAB — URINALYSIS, ROUTINE W REFLEX MICROSCOPIC
BILIRUBIN URINE: NEGATIVE
GLUCOSE, UA: NEGATIVE mg/dL
Hgb urine dipstick: NEGATIVE
Ketones, ur: NEGATIVE mg/dL
LEUKOCYTES UA: NEGATIVE
Nitrite: NEGATIVE
PH: 6.5 (ref 5.0–8.0)
Protein, ur: NEGATIVE mg/dL
Specific Gravity, Urine: 1.014 (ref 1.005–1.030)
Urobilinogen, UA: 0.2 mg/dL (ref 0.0–1.0)

## 2014-06-23 LAB — I-STAT CHEM 8, ED
BUN: 11 mg/dL (ref 6–23)
CALCIUM ION: 1.16 mmol/L (ref 1.12–1.23)
Chloride: 105 mEq/L (ref 96–112)
Creatinine, Ser: 1.2 mg/dL (ref 0.50–1.35)
Glucose, Bld: 102 mg/dL — ABNORMAL HIGH (ref 70–99)
HEMATOCRIT: 43 % (ref 39.0–52.0)
HEMOGLOBIN: 14.6 g/dL (ref 13.0–17.0)
Potassium: 3.9 mEq/L (ref 3.7–5.3)
Sodium: 142 mEq/L (ref 137–147)
TCO2: 24 mmol/L (ref 0–100)

## 2014-06-23 LAB — CBC
HCT: 39.6 % (ref 39.0–52.0)
Hemoglobin: 12.9 g/dL — ABNORMAL LOW (ref 13.0–17.0)
MCH: 27.9 pg (ref 26.0–34.0)
MCHC: 32.6 g/dL (ref 30.0–36.0)
MCV: 85.7 fL (ref 78.0–100.0)
PLATELETS: 165 10*3/uL (ref 150–400)
RBC: 4.62 MIL/uL (ref 4.22–5.81)
RDW: 14.5 % (ref 11.5–15.5)
WBC: 7.9 10*3/uL (ref 4.0–10.5)

## 2014-06-23 LAB — GLUCOSE, CAPILLARY
GLUCOSE-CAPILLARY: 155 mg/dL — AB (ref 70–99)
GLUCOSE-CAPILLARY: 142 mg/dL — AB (ref 70–99)
Glucose-Capillary: 94 mg/dL (ref 70–99)
Glucose-Capillary: 145 mg/dL — ABNORMAL HIGH (ref 70–99)

## 2014-06-23 LAB — RAPID URINE DRUG SCREEN, HOSP PERFORMED
AMPHETAMINES: NOT DETECTED
BARBITURATES: NOT DETECTED
BENZODIAZEPINES: NOT DETECTED
Cocaine: NOT DETECTED
Opiates: NOT DETECTED
TETRAHYDROCANNABINOL: POSITIVE — AB

## 2014-06-23 LAB — CBG MONITORING, ED
Glucose-Capillary: 111 mg/dL — ABNORMAL HIGH (ref 70–99)
Glucose-Capillary: 90 mg/dL (ref 70–99)

## 2014-06-23 LAB — BASIC METABOLIC PANEL
Anion gap: 12 (ref 5–15)
BUN: 10 mg/dL (ref 6–23)
CHLORIDE: 104 meq/L (ref 96–112)
CO2: 25 mEq/L (ref 19–32)
Calcium: 9.1 mg/dL (ref 8.4–10.5)
Creatinine, Ser: 0.99 mg/dL (ref 0.50–1.35)
GFR, EST NON AFRICAN AMERICAN: 89 mL/min — AB (ref >90)
GLUCOSE: 106 mg/dL — AB (ref 70–99)
POTASSIUM: 4.3 meq/L (ref 3.7–5.3)
Sodium: 141 mEq/L (ref 137–147)

## 2014-06-23 LAB — LIPID PANEL
CHOL/HDL RATIO: 3.8 ratio
CHOLESTEROL: 118 mg/dL (ref 0–200)
HDL: 31 mg/dL — AB (ref >39)
LDL Cholesterol: 78 mg/dL (ref 0–99)
TRIGLYCERIDES: 44 mg/dL (ref <150)
VLDL: 9 mg/dL (ref 0–40)

## 2014-06-23 LAB — ETHANOL

## 2014-06-23 LAB — I-STAT TROPONIN, ED: TROPONIN I, POC: 0.01 ng/mL (ref 0.00–0.08)

## 2014-06-23 LAB — PROTIME-INR
INR: 1.01 (ref 0.00–1.49)
Prothrombin Time: 13.4 seconds (ref 11.6–15.2)

## 2014-06-23 LAB — APTT: aPTT: 29 seconds (ref 24–37)

## 2014-06-23 MED ORDER — ATORVASTATIN CALCIUM 40 MG PO TABS
40.0000 mg | ORAL_TABLET | Freq: Every day | ORAL | Status: DC
  Administered 2014-06-23 – 2014-06-25 (×3): 40 mg via ORAL
  Filled 2014-06-23 (×3): qty 1

## 2014-06-23 MED ORDER — STROKE: EARLY STAGES OF RECOVERY BOOK
Freq: Once | Status: AC
  Administered 2014-06-23: 05:00:00
  Filled 2014-06-23: qty 1

## 2014-06-23 MED ORDER — HEPARIN SODIUM (PORCINE) 5000 UNIT/ML IJ SOLN
5000.0000 [IU] | Freq: Three times a day (TID) | INTRAMUSCULAR | Status: DC
  Administered 2014-06-23 – 2014-06-26 (×11): 5000 [IU] via SUBCUTANEOUS
  Filled 2014-06-23 (×11): qty 1

## 2014-06-23 MED ORDER — ASPIRIN EC 325 MG PO TBEC
325.0000 mg | DELAYED_RELEASE_TABLET | Freq: Every day | ORAL | Status: DC
  Administered 2014-06-23 – 2014-06-26 (×4): 325 mg via ORAL
  Filled 2014-06-23 (×4): qty 1

## 2014-06-23 MED ORDER — INSULIN ASPART 100 UNIT/ML ~~LOC~~ SOLN
0.0000 [IU] | SUBCUTANEOUS | Status: DC
  Administered 2014-06-23: 1 [IU] via SUBCUTANEOUS
  Administered 2014-06-23: 2 [IU] via SUBCUTANEOUS
  Administered 2014-06-23: 1 [IU] via SUBCUTANEOUS
  Administered 2014-06-24: 2 [IU] via SUBCUTANEOUS
  Administered 2014-06-24: 1 [IU] via SUBCUTANEOUS

## 2014-06-23 MED ORDER — SENNOSIDES-DOCUSATE SODIUM 8.6-50 MG PO TABS: 1.0000 | ORAL_TABLET | Freq: Every evening | ORAL | Status: DC | PRN

## 2014-06-23 MED ORDER — PNEUMOCOCCAL VAC POLYVALENT 25 MCG/0.5ML IJ INJ
0.5000 mL | INJECTION | INTRAMUSCULAR | Status: AC
  Administered 2014-06-24: 0.5 mL via INTRAMUSCULAR
  Filled 2014-06-23: qty 0.5

## 2014-06-23 MED ORDER — SODIUM CHLORIDE 0.9 % IV SOLN
INTRAVENOUS | Status: DC
  Administered 2014-06-23: 18:00:00 via INTRAVENOUS
  Administered 2014-06-24: 1000 mL via INTRAVENOUS

## 2014-06-23 NOTE — Code Documentation (Signed)
58 [facility] w[facility] for blurred vision and gaze palsy.  Per the pt he has had blurred vision for almost 2 wks and feels like his eyes have been "not right" since then.  His wife is unable to agree/disagree.  See doc flowsheet for code stroke times.

## 2014-06-23 NOTE — ED Notes (Signed)
The pt is c/o dizziness today no n or v no pain.  Diabetic non-insulin

## 2014-06-23 NOTE — Progress Notes (Signed)
Patient arrived around 0400 alert, oriented and with stable vital signs will continue to monitor for stroke like symptoms, only generalized weakness and lower extremity edema present at this time with dry flaky skin on both feet and lower legs.

## 2014-06-23 NOTE — Discharge Summary (Signed)
Name: [name] Williams MRN: [id number] DOB: 11/13/1955 58 y.o. PCP: Gara Kroner, MD  Date of Admission: 06/23/2014 12:08 AM Date of Discharge: 06/26/2014 Attending Physician: Levert Feinstein, MD  Discharge Diagnosis:  1. Cerebrovascular Accident  Principal Problem:   Acute CVA (cerebrovascular accident) Active Problems:   HLD (hyperlipidemia)   TOBACCO ABUSE   History of CVA (cerebrovascular accident)   Stroke-like symptom   Diplopia  Discharge Medications:   Medication List    STOP taking these medications        simvastatin 40 MG tablet  Commonly known as:  ZOCOR      TAKE these medications        aspirin EC 81 MG tablet  Take 1 tablet (81 mg total) by mouth daily.     atorvastatin 40 MG tablet  Commonly known as:  LIPITOR  Take 1 tablet (40 mg total) by mouth daily at 6 PM.     clopidogrel 75 MG tablet  Commonly known as:  PLAVIX  Take 1 tablet (75 mg total) by mouth daily.     haloperidol lactate 5 MG/ML injection  Commonly known as:  HALDOL  every 6 (six) hours as needed. Unsure of dosing, pt gets from mental health q 4 weeks chronically.     INVEGA SUSTENNA 117 MG/0.75ML Susp  Generic drug:  Paliperidone Palmitate     lisinopril 10 MG tablet  Commonly known as:  PRINIVIL,ZESTRIL  Take 1 tablet (10 mg total) by mouth daily.     metFORMIN 1000 MG tablet  Commonly known as:  GLUCOPHAGE  Take 1,000 mg by mouth daily with breakfast.     metFORMIN 1000 MG tablet  Commonly known as:  GLUCOPHAGE  TAKE 0.5 TABLETS (500 MG TOTAL) BY MOUTH 2 (TWO) TIMES DAILY WITH A MEAL.        Disposition and follow-up:   GabrielMaikel Williams was discharged from [facility] in good condition.  At the hospital follow up visit please address:  1.  Reassess neurological deficits, opthalmoplegia, etc. New medications- Lisinopril 10mg  daily, Atorvastatin 40mg  daily, and clopidogrel 75mg  daily.  2.  Labs / imaging needed at time of follow-up: None  3.   Pending labs/ test needing follow-up: None  Follow-up Appointments: Follow-up Information    Follow up with Farley Ly, MD.   Specialty:  Internal Medicine   Why:  Nov. 18 at 3:30PM Dr. Farley Ly   Contact information:   [address] [place] [address] [phone]       07/03/2014 at 3:30PM with Dr. Farley Ly at the [facility]  Discharge Instructions:  See Resident DC Summary Discharge Instructions    Ambulatory referral to Neurology    Complete by:  As directed   Pt will follow up with Dr. Roda Shutters at [facility] in about 2 months. Thanks.     Call MD for:  difficulty breathing, headache or visual disturbances    Complete by:  As directed      Call MD for:  persistant dizziness or light-headedness    Complete by:  As directed      Call MD for:  severe uncontrolled pain    Complete by:  As directed      Call MD for:    Complete by:  As directed      Diet - low sodium heart healthy    Complete by:  As directed      Diet - low sodium heart healthy  Complete by:  As directed      Discharge instructions    Complete by:  As directed   Ambulatory referral to Neurology    Complete by:  As directed   Pt will follow up with Dr. Roda Shutters at [facility] in about 2 months. Thanks.  We will be making some modifications to your medications.   Continue taking Aspirin- 81mg  once a day for 3 months then stop.  Start taking Plavix Also known as clopidogrel, take one tablet everyday, do not stop taking this medication till your doctor tells you to.  Stop taking Zocor or Simvastatin.  Take Lipitor- One tablet everyday.  We have started you on a blood pressure medication. Lisinopril- Take 1 tablet once a day.     Driving Restrictions    Complete by:  As directed   Patient is advised not to drive.     Increase activity slowly    Complete by:  As directed      Increase activity slowly    Complete by:  As directed      Walk with assistance    Complete by:  As directed            Consultations:  Neurology  Procedures Performed:  Ct Head Wo Contrast  06/23/2014   CLINICAL DATA:  Could stroke, dizziness, left facial droop  EXAM: CT HEAD WITHOUT CONTRAST  TECHNIQUE: Contiguous axial images were obtained from the base of the skull through the vertex without intravenous contrast.  COMPARISON:  Brain MRI 11/06/2009, head CT 11/06/2009  FINDINGS: No acute intracranial hemorrhage. No focal mass lesion. No CT evidence of acute infarction. No midline shift or mass effect. No hydrocephalus. Basilar cisterns are patent.  There is generalized cortical atrophy. Mild periventricular white matter hypodensities.  Paranasal sinuses and  mastoid air cells are clear.  IMPRESSION: 1. No CT evidence of acute infarction. 2. No intracranial hemorrhage. 3. Generalized atrophy and mild microvascular disease are similar to prior.  Findings conveyed toOLGA OTTER on 06/23/2014  at00:54.   Electronically Signed   By: Genevive Bi M.D.   On: 06/23/2014 00:55   Mr Shirlee Latch Wo Contrast  06/23/2014   CLINICAL DATA:  Double vision in the left eye for the last week. Stroke like symptoms. Acute loss of balance beginning at midnight last night. Acute onset of left-sided weakness.  EXAM: MRI HEAD WITHOUT CONTRAST  MRA HEAD WITHOUT CONTRAST  TECHNIQUE: Multiplanar, multiecho pulse sequences of the brain and surrounding structures were obtained without intravenous contrast. Angiographic images of the head were obtained using MRA technique without contrast.  COMPARISON:  CT head from the same day.  MRI brain 11/06/2009  FINDINGS: MRI HEAD FINDINGS  Remote right cerebellar infarcts are again noted. The diffusion-weighted images demonstrate no evidence for acute or subacute infarct.  No acute hemorrhage or mass lesion is present. There are remote blood products associated with the inferior right cerebellar infarct.  Remote lacunar infarcts are present within the basal ganglia and thalami bilaterally. There are  remote lacunar infarcts in the brainstem.  Flow is present in the major intracranial arteries. The globes and orbits are intact. Mild mucosal thickening is present in the left maxillary sinus and anterior ethmoid air cells bilaterally.  MRA HEAD FINDINGS  The study is moderately degraded by patient motion, limiting evaluation of small vessels. The most significant motion is in the lower slab, which affects the basilar and internal carotid arteries.  Signal loss at the slab overlap is likely artifactual.  Atherosclerotic changes are present within the cavernous internal carotid arteries bilaterally without a definite stenosis.  The A1 and M1 segments are intact. The anterior communicating artery is not discretely visualized. The MCA bifurcations are intact. There is asymmetric attenuation of left-sided MCA branch vessels compared to the right.  The left vertebral artery is the dominant vessel. Signal loss in the proximal basilar artery is artifactual. The distal basilar artery is intact. There is moderate attenuation of distal PCA branch vessels.  IMPRESSION: 1. No acute infarct. 2. Remote right cerebellar infarcts with some remote blood products associated. 3. Remote lacunar infarcts of the basal ganglia and brainstem. 4. Atrophy and diffuse white matter disease reflects the sequela of chronic microvascular ischemia. 5. The MRA circle of Willis is moderately degraded by patient motion. 6. Atherosclerotic changes in the cavernous internal carotid arteries bilaterally without definite significant stenosis. 7. Medium and small vessel attenuation diffusely is most prominent in the left MCA branch vessels. This may reflect stenosis within the intracranial ICA.   Electronically Signed   By: Gennette Pachris  Mattern M.D.   On: 06/23/2014 12:12   [name] Contrast  06/23/2014   CLINICAL DATA:  Double vision in the left eye for the last week. Stroke like symptoms. Acute loss of balance beginning at midnight last night. Acute  onset of left-sided weakness.  EXAM: MRI HEAD WITHOUT CONTRAST  MRA HEAD WITHOUT CONTRAST  TECHNIQUE: Multiplanar, multiecho pulse sequences of the brain and surrounding structures were obtained without intravenous contrast. Angiographic images of the head were obtained using MRA technique without contrast.  COMPARISON:  CT head from the same day.  MRI brain 11/06/2009  FINDINGS: MRI HEAD FINDINGS  Remote right cerebellar infarcts are again noted. The diffusion-weighted images demonstrate no evidence for acute or subacute infarct.  No acute hemorrhage or mass lesion is present. There are remote blood products associated with the inferior right cerebellar infarct.  Remote lacunar infarcts are present within the basal ganglia and thalami bilaterally. There are remote lacunar infarcts in the brainstem.  Flow is present in the major intracranial arteries. The globes and orbits are intact. Mild mucosal thickening is present in the left maxillary sinus and anterior ethmoid air cells bilaterally.  MRA HEAD FINDINGS  The study is moderately degraded by patient motion, limiting evaluation of small vessels. The most significant motion is in the lower slab, which affects the basilar and internal carotid arteries.  Signal loss at the slab overlap is likely artifactual. Atherosclerotic changes are present within the cavernous internal carotid arteries bilaterally without a definite stenosis.  The A1 and M1 segments are intact. The anterior communicating artery is not discretely visualized. The MCA bifurcations are intact. There is asymmetric attenuation of left-sided MCA branch vessels compared to the right.  The left vertebral artery is the dominant vessel. Signal loss in the proximal basilar artery is artifactual. The distal basilar artery is intact. There is moderate attenuation of distal PCA branch vessels.  IMPRESSION: 1. No acute infarct. 2. Remote right cerebellar infarcts with some remote blood products associated. 3.  Remote lacunar infarcts of the basal ganglia and brainstem. 4. Atrophy and diffuse white matter disease reflects the sequela of chronic microvascular ischemia. 5. The MRA circle of Willis is moderately degraded by patient motion. 6. Atherosclerotic changes in the cavernous internal carotid arteries bilaterally without definite significant stenosis. 7. Medium and small vessel attenuation diffusely is most prominent in the left MCA branch vessels. This may reflect stenosis within the intracranial ICA.  Electronically Signed   By: Gennette Pachris  Mattern M.D.   On: 06/23/2014 12:12   Carotid Duplex:  Preliminary report: Right - 1% to 39% ICA stenosis. There is no evidence of color flow in the right vertebral with only a severely diminished thumping Doppler signal suggestive of a possible occlusion. Left - There is a 40% to 59% ICA stenosis by systolic velocities however the diastolic velocity, ICA/CCA ratio, plaque characterization , and spectral broadening would suggest a 60% to 79% stenosis. Vertebral artery flow is antegrade.  SLAUGHTER, [place], RVS 06/24/2014, 1:07 PM  Echo:   Study Conclusions  - Left ventricle: The cavity size was normal. Systolic function was mildly reduced. The estimated ejection fraction was in the range of 45% to 50%. Diffuse hypokinesis. Doppler parameters are consistent with abnormal left ventricular relaxation (grade 1 diastolic dysfunction). - Mitral valve: There was mild regurgitation. - Right ventricle: The cavity size was mildly dilated. Wall thickness was normal. - Right atrium: The atrium was mildly dilated. - Pulmonary arteries: Systolic pressure was mildly increased. PA peak pressure: 32 mm Hg (S).  Impressions:  - No cardiac source of emboli was indentified.  Transthoracic echocardiography. M-mode, complete 2D, spectral Doppler, and color Doppler. Birthdate: Patient birthdate: July 21, 1956. Age: Patient is 58 yr old. Sex: Gender:  male. BMI: 23.5 kg/m^2. Blood pressure:   147/81 Patient status: Inpatient. Study date: Study date: 06/24/2014. Study time: 11:53 AM. Location: Echo laboratory.  Admission HPI:  Mr. 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 Around 10pm, he stood and felt dizzy. As he was walking, he lost his balance and fell. Denies head trauma or LOC. He reports generalized weakness and blurry vision. No previous episodes. Per sister who is his caretaker, his speech is at baseline.  He notes he has had bilateral LE edema for the past two weeks. Denies fevers, chills, headache, cough, shortness of breath, chest pain, nausea, vomiting, abdominal pain, diarrhea, dysuria, bleeding/bruising problems.   Hospital Course by problem list:   1. Cerebrovascular Accident: A code stroke was called on arrival to the ED as the patient presented with left sided facial droop and disconjugate gaze, when he arrived he was assessed and a CT - neg for bleeds but did show consistent findings with his old imaging. He was >4 hours since onset of symptoms as he mentioned having blurry vision for over a week to the ED physician, and thus he was not a tPA candidate. Neurology was consulted plans for stroke workup were initiated. Pt was placed on Atorvastatin 40mg  and ASA 325mg  daily were continued, placed on sliding scale insulin. He was also maintained on normal saline at 75cc/hr for maintanence fluids, and maintained NPO until he passed his swallow study. MRI/MRA was performed on the patient and returned showing no acute stroke, however showed evidence of remote strokes in his right cerebellum, remote lacunar infarcts of the basal ganglia and brainstem, and other findings suggestive of atherosclerosis and stenosis of the cerebral vasculature. On admission and on discharge, pt had persistent Impaired adduction of the right eye,  concomitant nystagmus of the left eye, and left sided weakness. Lipid panel also returned showing HDL-31 LDL-78, total- 118, Hemoglobin a1c at 6.4. Telemetry was reviewed and showed only a few non-concerning events of bradycardia and tachycardia. His carotid dopplers revealed  1-39% stenosis on the right ICA and 40-59% on the left ICA, however based on diastolic velocities they  were concerned that the actual degree of stenosis could be 60-79%. His TTE showed no evidence of a cardiac emboli source, and an EF of 45-50% with diffuse hypokinesis (full results above). Based on clinical findings, Neurology recs despite MRI findings was that pt likely had a new stroke involving the posterior circulation, and with this location, is not be necessary to treat his ICA stenosis surgically at this time but instead continue with medical therapy. Also to continue on ASA 81 daily for 3 momths and start Plavix 75mg , to be continued indefinitely.   2. Type II Diabetes Mellitus:  Pt was placed on SSI, on admission. Home metformin- 1500mg  am and 500mg  at night was restarted on discharge. HgbA1c- 6.4 06/23/2014. Resume home meds on discharge.   3. Schizophrenia: The patient's schizophrenia medications were held temporarily during his stay and can be restarted upon discharge. Dose of meds- Invega an Haloperidol, could not be confirmed, and so werenot started on admission. Called pt pharmacy but he apparently had not filled them there. Emergency number listed not working, also called number listed on chart by Inpatient rehab co-ordinator, and left voice mail message. Please follow up on discharge pts medications.  Discharge Vitals:   BP 110/72 mmHg  Pulse 74  Temp(Src) 98.9 F (37.2 C) (Oral)  Resp 16  Ht 5\' 10"  (1.778 m)  Wt 164 lb 8 oz (74.617 kg)  BMI 23.60 kg/m2  SpO2 100%  Discharge Labs:  Results for orders placed or performed during the hospital encounter of 06/23/14 (from the past 24 hour(s))  Glucose, capillary      Status: Abnormal   Collection Time: 06/25/14 11:41 AM  Result Value Ref Range   Glucose-Capillary 151 (H) 70 - 99 mg/dL  Glucose, capillary     Status: Abnormal   Collection Time: 06/25/14  4:30 PM  Result Value Ref Range   Glucose-Capillary 142 (H) 70 - 99 mg/dL  Glucose, capillary     Status: Abnormal   Collection Time: 06/25/14  9:50 PM  Result Value Ref Range   Glucose-Capillary 164 (H) 70 - 99 mg/dL   Comment 1 Documented in Chart    Comment 2 Notify RN   Glucose, capillary     Status: Abnormal   Collection Time: 06/26/14  6:13 AM  Result Value Ref Range   Glucose-Capillary 123 (H) 70 - 99 mg/dL    Signed: Onnie Boer, MD 06/26/2014, 11:16 AM    Services Ordered on Discharge: CIR Equipment Ordered on Discharge: None

## 2014-06-23 NOTE — Progress Notes (Addendum)
Subjective: Pt with obvious deficits concerning for a stroke. No complaints today. No new deficits overnight.  Objective: Vital signs in last 24 hours: Filed Vitals:   06/23/14 0355 06/23/14 0555 06/23/14 0800 06/23/14 0952  BP: 172/81 170/82 167/83 147/81  Pulse: 51 51 64 65  Temp: 98.2 F (36.8 C) 98.3 F (36.8 C) 97.7 F (36.5 C) 98.1 F (36.7 C)  TempSrc: Oral Oral Axillary Oral  Resp: 20  20 16   Height: 5\' 10"  (1.778 m)     Weight: 164 lb 8 oz (74.617 kg)     SpO2: 100% 100% 100% 98%   Weight change:   Intake/Output Summary (Last 24 hours) at 06/23/14 1103 Last data filed at 06/23/14 0900  Gross per 24 hour  Intake      0 ml  Output    300 ml  Net   -300 ml   Physical Exam-  General appearance: alert, cooperative, appears stated age and no distress Head: Normocephalic, without obvious abnormality, atraumatic Lungs: clear to auscultation bilaterally Heart: regular rate and rhythm, S1, S2 normal, no murmur, click, rub or gallop Abdomen: soft, non-tender; bowel sounds normal; no masses,  no organomegaly Extremities: extremities normal, atraumatic, no cyanosis or edema Pulses: 2+ and symmetric  Neuro- 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   Basic Metabolic Panel:  Recent Labs Lab 06/23/14 0025 06/23/14 0036 06/23/14 0651  NA 140 142 141  K 4.1 3.9 4.3  CL 102 105 104  CO2 26  --  25  GLUCOSE 100* 102* 106*  BUN 12 11 10   CREATININE 1.12 1.20 0.99  CALCIUM 9.5  --  9.1   Liver Function Tests:  Recent Labs Lab 06/23/14 0025  AST 23  ALT 15  ALKPHOS 54  BILITOT 0.3  PROT 7.3  ALBUMIN 3.5   CBC:  Recent Labs Lab 06/23/14 0025 06/23/14 0036 06/23/14 0651  WBC 7.1  --  7.9  NEUTROABS 3.6  --   --   HGB 12.7* 14.6 12.9*  HCT 39.1 43.0 39.6  MCV 86.3  --  85.7  PLT 188  --  165   CBG:  Recent  Labs Lab 06/23/14 0009 06/23/14 0029 06/23/14 0426  GLUCAP 111* 90 94   Fasting Lipid Panel:  Recent Labs Lab 06/23/14 0645  CHOL 118  HDL 31*  LDLCALC 78  TRIG 44  CHOLHDL 3.8   Coagulation:  Recent Labs Lab 06/23/14 0034  LABPROT 13.4  INR 1.01   Urine Drug Screen: Drugs of Abuse     Component Value Date/Time   LABOPIA NONE DETECTED 06/23/2014 0220   COCAINSCRNUR NONE DETECTED 06/23/2014 0220   LABBENZ NONE DETECTED 06/23/2014 0220   AMPHETMU NONE DETECTED 06/23/2014 0220   THCU POSITIVE* 06/23/2014 0220   LABBARB NONE DETECTED 06/23/2014 0220    Alcohol Level:  Recent Labs Lab 06/23/14 0034  ETH <11   Urinalysis:  Recent Labs Lab 06/23/14 0220  COLORURINE YELLOW  LABSPEC 1.014  PHURINE 6.5  GLUCOSEU NEGATIVE  HGBUR NEGATIVE  BILIRUBINUR NEGATIVE  KETONESUR NEGATIVE  PROTEINUR NEGATIVE  UROBILINOGEN 0.2  NITRITE NEGATIVE  LEUKOCYTESUR NEGATIVE   Studies/Results: Ct Head Wo Contrast  06/23/2014   CLINICAL DATA:  Could stroke, dizziness, left facial droop  EXAM: CT HEAD WITHOUT CONTRAST  TECHNIQUE: Contiguous axial images were obtained from the base of the skull through the vertex without intravenous  contrast.  COMPARISON:  Brain MRI 11/06/2009, head CT 11/06/2009  FINDINGS: No acute intracranial hemorrhage. No focal mass lesion. No CT evidence of acute infarction. No midline shift or mass effect. No hydrocephalus. Basilar cisterns are patent.  There is generalized cortical atrophy. Mild periventricular white matter hypodensities.  Paranasal sinuses and  mastoid air cells are clear.  IMPRESSION: 1. No CT evidence of acute infarction. 2. No intracranial hemorrhage. 3. Generalized atrophy and mild microvascular disease are similar to prior.  Findings conveyed toOLGA OTTER on 06/23/2014  at00:54.   Electronically Signed   By: Genevive BiStewart  Edmunds M.D.   On: 06/23/2014 00:55   Scheduled Meds: . aspirin  325 mg Oral Daily  . atorvastatin  40 mg Oral q1800  .  heparin  5,000 Units Subcutaneous 3 times per day  . insulin aspart  0-9 Units Subcutaneous 6 times per day  . [START ON 06/24/2014] pneumococcal 23 valent vaccine  0.5 mL Intramuscular Tomorrow-1000   Continuous Infusions: . sodium chloride     PRN Meds:.senna-docusate Assessment/Plan: Principal Problem:   Acute CVA (cerebrovascular accident) Active Problems:   HLD (hyperlipidemia)   TOBACCO ABUSE   History of CVA (cerebrovascular accident)   Stroke-like symptom  Dizziness: Presenting with impaired adduction of R eye, L sided weakness. Concerning for acute CVA. 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 Multiple risk factors for stroke including DM2, HLD, HTN, smoking as well as race, sex and family history. CT head demonstrates no acute infarction or intracranial hemorrhage.  -Neurology consulted recs appreciated. Passed bedside swallow eval. Lipid panel- LDL- 78, total- 118. Last hgbA1c- 6.6. -MRI/MRA head pending -Carotid dopplers- Pending -Echo- pending -Obtain HgbA1c- Pending -PT/OT, Speech consult  -Continue ASA 325mg  daily -Continue home Zocor 40mg  as Lipitor 40mg  daily -Neuro checks, telemetry monitoring  DM2: On metformin 1500mg  QAM and 500mg  QPM at home. -SSI, Accuchecks Q4hr  Schizophrenia: on haldol and Invega. Will need to clarify dosing in AM.  FEN:  - Carb mod -NS@ 1575ml/hr  DVT ppx: subQ hep  Dispo: [place] discharge tomorrow pending completion of stroke work up.  The patient does have a current PCP Gara Kroner(Diana Truong, MD) and does need [facility] after discharge.  The patient does have transportation limitations that hinder transportation to clinic appointments.  .Services Needed at time of discharge: Y = Yes, Blank = No PT:   OT:   RN:   Equipment:   Other:     LOS: 0 days   Onnie BoerEjiroghene E Michalle Rademaker, MD 06/23/2014, 11:03 AM

## 2014-06-23 NOTE — ED Provider Notes (Signed)
CSN: [id number]     Arrival date & time 06/23/14  0005 History   First MD Initiated Contact with Patient 06/23/14 0019     Chief Complaint  Patient presents with  . Dizziness     (Consider location/radiation/quality/duration/timing/severity/associated sxs/prior Treatment) HPI 58 year old male presents to the emergency department from home with complaint of dizziness.  He denies any nausea vomiting diarrhea.  He and his spouse report that around midnight after watching TV he walked to the bathroom and fell, and then fell coming back for the bathroom.  Patient reports that the world is spinning to his right.  He denies previous history of vertigo.  Patient has history of hypertension, diabetes, stroke and is a smoker.  Patient denies any recent illnesses.  He denies any headache.  He reports that he is compliant with his medications. Past Medical History  Diagnosis Date  . Diabetes mellitus   . Hypertension   . History of stroke    History reviewed. No pertinent past surgical history. Family History  Problem Relation Age of Onset  . Hypertension Mother   . Hypertension Father   . Coronary artery disease Maternal Aunt   . Diabetes Maternal Aunt   . Coronary artery disease Maternal Uncle   . Diabetes Maternal Uncle   . Diabetes Paternal Aunt   . Coronary artery disease Paternal Aunt   . Diabetes Paternal Uncle   . Coronary artery disease Paternal Uncle    History  Substance Use Topics  . Smoking status: Current Every Day Smoker -- 0.10 packs/day for 10 years    Types: Cigarettes  . Smokeless tobacco: Not on file  . Alcohol Use: Yes     Comment: 2-3 cans of beer/week    Review of Systems   See History of Present Illness; otherwise all other systems are reviewed and negative  Allergies  Review of patient's allergies indicates no known allergies.  Home Medications   Prior to Admission medications   Medication Sig Start Date End Date Taking? Authorizing Provider  metFORMIN  (GLUCOPHAGE) 1000 MG tablet Take 1,000 mg by mouth daily with breakfast.   Yes Historical Provider, MD  aspirin 325 MG EC tablet Take 325 mg by mouth daily.      Historical Provider, MD  haloperidol lactate (HALDOL) 5 MG/ML injection every 6 (six) hours as needed. Unsure of dosing, pt gets from mental health q 4 weeks chronically.     Historical Provider, MD  INVEGA SUSTENNA 117 MG/0.75ML SUSP  12/31/13   Historical Provider, MD  metFORMIN (GLUCOPHAGE) 1000 MG tablet TAKE 0.5 TABLETS (500 MG TOTAL) BY MOUTH 2 (TWO) TIMES DAILY WITH A MEAL. 06/04/14   Gara Kroneriana Truong, MD  simvastatin (ZOCOR) 40 MG tablet Take 1 tablet (40 mg total) by mouth every evening. 08/29/13 08/29/14  Neema Davina PokeK Sharda, MD   BP 159/88 mmHg  Pulse 83  Temp(Src) 98.3 F (36.8 C) (Oral)  Resp 20  SpO2 100% Physical Exam  Constitutional: He is oriented to person, place, and time. He appears well-developed and well-nourished.   Thin male in no acute distress  HENT:  Head: Normocephalic and atraumatic.  Right Ear: External ear normal.  Left Ear: External ear normal.  Nose: Nose normal.  Mouth/Throat: Oropharynx is clear and moist.  Eyes: Conjunctivae are normal. Pupils are equal, round, and reactive to light.  Patient has significant disconjugate gaze, unable to gaze contra-laterally with his right eye.  Patient reports double vision  Neck: Normal range of motion. Neck supple. No  JVD present. No tracheal deviation present. No thyromegaly present.  Cardiovascular: Normal rate, regular rhythm, normal heart sounds and intact distal pulses.  Exam reveals no gallop and no friction rub.   No murmur heard. Pulmonary/Chest: Effort normal and breath sounds normal. No stridor. No respiratory distress. He has no wheezes. He has no rales. He exhibits no tenderness.  Abdominal: Soft. Bowel sounds are normal. He exhibits no distension and no mass. There is no tenderness. There is no rebound and no guarding.  Musculoskeletal: Normal range of  motion. He exhibits no edema or tenderness.  Lymphadenopathy:    He has no cervical adenopathy.  Neurological: He is alert and oriented to person, place, and time. He displays normal reflexes. A cranial nerve deficit (left facial droop noted, INO of right eye) is present. He exhibits normal muscle tone. Coordination (patient has weakness of left arm and ataxia of left leg) abnormal.  Skin: Skin is warm and dry. No rash noted. No erythema. No pallor.  Psychiatric: He has a normal mood and affect. His behavior is normal. Judgment and thought content normal.  Nursing note and vitals reviewed.   ED Course  Procedures (including critical care time) Labs Review Labs Reviewed  CBG MONITORING, ED - Abnormal; Notable for the following:    Glucose-Capillary 111 (*)    All other components within normal limits  COMPREHENSIVE METABOLIC PANEL  CBC WITH DIFFERENTIAL  URINALYSIS, ROUTINE W REFLEX MICROSCOPIC  ETHANOL  PROTIME-INR  APTT  URINE RAPID DRUG SCREEN (HOSP PERFORMED)  CBG MONITORING, ED  I-STAT CHEM 8, ED  I-STAT TROPOININ, ED  I-STAT TROPOININ, ED    Imaging Review No results found.   EKG Interpretation   Date/Time:  "Sunday June 23 2014 00:14:12 EST Ventricular Rate:  82 PR Interval:  140 QRS Duration: 86 QT Interval:  392 QTC Calculation: 457 R Axis:   56 Text Interpretation:  Normal sinus rhythm Left ventricular hypertrophy  with repolarization abnormality Abnormal ECG t wave inversions seen on  prior ekg have resolved Confirmed by Harce Volden  MD, Tabor Denham ([id number]) on 06/23/2014  12:24:01 AM     CRITICAL CARE Performed by: Avilene Marrin M Total critical care time: 60 min Critical care time was exclusive of separately billable procedures and treating other patients. Critical care was necessary to treat or prevent imminent or life-threatening deterioration. Critical care was time spent personally by me on the following activities: development of treatment plan with patient  and/or surrogate as well as nursing, discussions with consultants, evaluation of patient's response to treatment, examination of patient, obtaining history from patient or surrogate, ordering and performing treatments and interventions, ordering and review of laboratory studies, ordering and review of radiographic studies, pulse oximetry and re-evaluation of patient's condition.  MDM   Final diagnoses:  Acute CVA (cerebrovascular accident)  TOBACCO ABUSE      12" :35 AM Code stroke initiated after exam showed disconjugate gaze, and left sided facial droop.  Symptoms onset verified with patient and wife of midnight.   Patient reports after CT scan that he has been having blurred vision for about a week, and due to onset of symptoms greater than 4 hours ago is not a TPA candidate.  Dr. Amada JupiterKirkpatrick with neurology has seen the patient.  He is to be admitted to the hospital.  Olivia Mackielga M Kayelynn Abdou, MD 06/23/14 [phone]

## 2014-06-23 NOTE — Consult Note (Signed)
Neurology Consultation Reason for Consult: Stroke Referring Physician: Marisa Severintter, Olga  CC: Stroke  History is obtained from:patient  HPI: [name] Williams is a 58 y.o. male with a history of previous CVA who states that he became acutely off balance around midnight. He also notes that he has double vision and that the double vision has eben present for at least a week.   He now also complains of left sided weakness and dizziness that started acutely tonight.    LKW: 11/1 tpa given?: no, outside of window.     ROS: A 14 point ROS was performed and is negative except as noted in the HPI.   Past Medical History  Diagnosis Date  . Diabetes mellitus   . Hypertension   . History of stroke     Family History: No hx stroke  Social History: Tob: current smoker  Exam: Current vital signs: BP 159/88 mmHg  Pulse 83  Temp(Src) 98.3 F (36.8 C) (Oral)  Resp 20  SpO2 100% Vital signs in last 24 hours: Temp:  [98.3 F (36.8 C)] 98.3 F (36.8 C) (11/08 0010) Pulse Rate:  [83] 83 (11/08 0010) Resp:  [20] 20 (11/08 0010) BP: (159)/(88) 159/88 mmHg (11/08 0010) SpO2:  [100 %] 100 % (11/08 0010)  General: in bed, NAD CV: RRR Mental Status: Patient is awake, alert, oriented to person, place, month, year, and situation. Immediate and remote memory are intact. Patient is able to give a clear and coherent history. No signs of aphasia or neglect Cranial Nerves: II: Visual Fields are full. Pupils are equal, round, and reactive to light.  Discs are difficult to visualize. III,IV, VI: He has a prominent INO with impaired adduction on the right.  V: Facial sensation is symmetric to temperature VII: Facial movement is left facial weakness VIII: hearing is intact to voice X: Uvula elevates symmetrically XI: Shoulder shrug is symmetric. XII: tongue is midline without atrophy or fasciculations.  Motor: Tone is normal. Bulk is normal. 5/5 strength was present on the right, on the left, he  has 4/5 weakness of the arm and leg.  Sensory: Sensation is symmetric to light touch and temperature in the arms and legs. Deep Tendon Reflexes: 2+ and symmetric in the biceps and patellae.  Cerebellar: FNF and HKS are impaired on the left Gait: Not assessed due to acute nature of evaluation and multiple medical monitors in ED setting.    I have reviewed labs in epic and the results pertinent to this consultation are: cmp - unremarkable.   I have reviewed the images obtained:CT head - no acute findings.   Impression: 58 yo M with likely brainstem infarct. I suspect that he had a small infarct last week at onset of his double vision with extension today. He will need to be admitted for secondary stroke prevention and physical therapy.   Recommendations: 1. HgbA1c, fasting lipid panel 2. MRI, MRA  of the brain without contrast 3. Frequent neuro checks 4. Echocardiogram 5. Carotid dopplers 6. Prophylactic therapy-Antiplatelet med: Aspirin - dose 325mg  PO or 300mg  PR 7. Risk factor modification 8. Telemetry monitoring 9. PT consult, OT consult, Speech consult    Ritta SlotMcNeill Lucca Greggs, MD [facility] [phone]  If 7pm- 7am, please page neurology on call as listed in AMION.

## 2014-06-23 NOTE — H&P (Signed)
Date: 06/23/2014               Patient Name:  [name] Williams MRN: [id number]  DOB: 02-Jun-1956 Age / Sex: 58 y.o., male   PCP: Gara Kroneriana Truong, MD         Medical Service: Internal Medicine Teaching Service         Attending Physician: Dr. Levert FeinsteinJames M Granfortuna, MD    First Contact: Dr. Danella Pentonruong Pager: [phone]  Second Contact: Dr. Mariea ClontsEmokpae Pager: [phone]       After Hours (After 5p/  First Contact Pager: [phone]  weekends / holidays): Second Contact Pager: [phone]   Chief Complaint: dizziness  History of Present Illness: [name] Williams is a 58 year old man with history of DM2, HTN, HLD, CVA in 2011 with residual dysarthria and R sided weakness presenting with dizziness. Around 10pm, he stood and felt dizzy. As he was walking, he lost his balance and fell. Denies head trauma or LOC. He reports generalized weakness and blurry vision. No previous episodes. Per sister who is his caretaker, his speech is at baseline.  He notes he has had bilateral LE edema for the past two weeks. Denies fevers, chills, headache, cough, shortness of breath, chest pain, nausea, vomiting, abdominal pain, diarrhea, dysuria, bleeding/bruising problems.   Meds: No current facility-administered medications for this encounter.   Current Outpatient Prescriptions  Medication Sig Dispense Refill  . metFORMIN (GLUCOPHAGE) 1000 MG tablet Take 1,000 mg by mouth daily with breakfast.    . aspirin 325 MG EC tablet Take 325 mg by mouth daily.      . haloperidol lactate (HALDOL) 5 MG/ML injection every 6 (six) hours as needed. Unsure of dosing, pt gets from mental health q 4 weeks chronically.     Hinda Glatter. INVEGA SUSTENNA 117 MG/0.75ML SUSP     . metFORMIN (GLUCOPHAGE) 1000 MG tablet TAKE 0.5 TABLETS (500 MG TOTAL) BY MOUTH 2 (TWO) TIMES DAILY WITH A MEAL. 60 tablet 3  . simvastatin (ZOCOR) 40 MG tablet Take 1 tablet (40 mg total) by mouth every evening. 30 tablet 11    Allergies: Allergies as of 06/23/2014  . (No Known Allergies)    Past Medical History  Diagnosis Date  . Diabetes mellitus   . Hypertension   . History of stroke    History reviewed. No pertinent past surgical history. Family History  Problem Relation Age of Onset  . Hypertension Mother   . Hypertension Father   . Coronary artery disease Maternal Aunt   . Diabetes Maternal Aunt   . Coronary artery disease Maternal Uncle   . Diabetes Maternal Uncle   . Diabetes Paternal Aunt   . Coronary artery disease Paternal Aunt   . Diabetes Paternal Uncle   . Coronary artery disease Paternal Uncle    History   Social History  . Marital Status: Legally Separated    Spouse Name: N/A    Number of Children: 0  . Years of Education: 12th   Occupational History  . Not on file.   Social History Main Topics  . Smoking status: Current Every Day Smoker -- 0.10 packs/day for 10 years    Types: Cigarettes  . Smokeless tobacco: Not on file  . Alcohol Use: Yes     Comment: 2-3 cans of beer/week  . Drug Use: Yes     Comment: marijuana occassionally   . Sexual Activity: No   Other Topics Concern  . Not on file   Social History Narrative   Does regular exercise.  Has never worked, sister takes care of him    Review of Systems: Constitutional: no fevers/chills Eyes: +blurred vision Ears, nose, mouth, throat, and face: no cough Respiratory: no shortness of breath Cardiovascular: no chest pain Gastrointestinal: no nausea/vomiting, no abdominal pain, no constipation, no diarrhea Genitourinary: no dysuria, no hematuria Integument: no rash Hematologic/lymphatic: no bleeding/bruising, +LE edema Musculoskeletal: no arthralgias, no myalgias Neurological: no paresthesias, +weakness   Physical Exam: Blood pressure 159/88, pulse 83, temperature 98.3 F (36.8 C), temperature source Oral, resp. rate 20, SpO2 100 %. General Apperance: NAD Head: Normocephalic, atraumatic Eyes: PERRL, anicteric sclera, impaired adduction of R eye Ears: Normal external  ear canal Nose: Nares normal, septum midline, mucosa normal Throat: Lips, mucosa and tongue normal  Neck: Supple, trachea midline Back: No tenderness or bony abnormality  Lungs: Clear to auscultation bilaterally. No wheezes, rhonchi or rales. Breathing comfortably on RA Chest Wall: Nontender, no deformity Heart: Regular rate and rhythm, no murmur/rub/gallop Abdomen: Soft, nontender, nondistended, no rebound/guarding Extremities: Atraumatic, warm and well perfused, 1+ pitting edema bilateral LE Pulses: 2+ throughout Skin: No rashes or lesions Neurologic: Alert and oriented x 3. Speech fluent without evidence of aphasia. Mild dysarthria. Able to follow commands without difficulty. Impaired adduction of R eye. Mild left facial weakness at rest. Otherwise, CNII-XII intact. 4/5 strength in L upper and lower extremities. DTR intact bilaterally. Finger nose finger intact.    Lab results: Basic Metabolic Panel:  Recent Labs  16/10/96 0025 06/23/14 0036  NA 140 142  K 4.1 3.9  CL 102 105  CO2 26  --   GLUCOSE 100* 102*  BUN 12 11  CREATININE 1.12 1.20  CALCIUM 9.5  --    Liver Function Tests:  Recent Labs  06/23/14 0025  AST 23  ALT 15  ALKPHOS 54  BILITOT 0.3  PROT 7.3  ALBUMIN 3.5   CBC:  Recent Labs  06/23/14 0025 06/23/14 0036  WBC 7.1  --   NEUTROABS 3.6  --   HGB 12.7* 14.6  HCT 39.1 43.0  MCV 86.3  --   PLT 188  --    Cardiac Enzymes: Troponin POC 06/23/2014 0029: 0.01  CBG:  Recent Labs  06/23/14 0009 06/23/14 0029  GLUCAP 111* 90   Coagulation:  Recent Labs  06/23/14 0034  LABPROT 13.4  INR 1.01   Urine Drug Screen: Drugs of Abuse     Component Value Date/Time   LABOPIA NONE DETECTED 08/09/2008 0401   COCAINSCRNUR NONE DETECTED 08/09/2008 0401   LABBENZ NONE DETECTED 08/09/2008 0401   AMPHETMU NONE DETECTED 08/09/2008 0401   THCU NONE DETECTED 08/09/2008 0401   LABBARB  08/09/2008 0401    NONE DETECTED        DRUG SCREEN FOR MEDICAL  PURPOSES ONLY.  IF CONFIRMATION IS NEEDED FOR ANY PURPOSE, NOTIFY LAB WITHIN 5 DAYS.    Alcohol Level:  Recent Labs  06/23/14 0034  ETH <11   Urinalysis:  Recent Labs  06/23/14 0220  COLORURINE YELLOW  LABSPEC 1.014  PHURINE 6.5  GLUCOSEU NEGATIVE  HGBUR NEGATIVE  BILIRUBINUR NEGATIVE  KETONESUR NEGATIVE  PROTEINUR NEGATIVE  UROBILINOGEN 0.2  NITRITE NEGATIVE  LEUKOCYTESUR NEGATIVE    Imaging results:  Ct Head Wo Contrast  06/23/2014   CLINICAL DATA:  Could stroke, dizziness, left facial droop  EXAM: CT HEAD WITHOUT CONTRAST  TECHNIQUE: Contiguous axial images were obtained from the base of the skull through the vertex without intravenous contrast.  COMPARISON:  Brain MRI 11/06/2009, head CT  11/06/2009  FINDINGS: No acute intracranial hemorrhage. No focal mass lesion. No CT evidence of acute infarction. No midline shift or mass effect. No hydrocephalus. Basilar cisterns are patent.  There is generalized cortical atrophy. Mild periventricular white matter hypodensities.  Paranasal sinuses and  mastoid air cells are clear.  IMPRESSION: 1. No CT evidence of acute infarction. 2. No intracranial hemorrhage. 3. Generalized atrophy and mild microvascular disease are similar to prior.  Findings conveyed toOLGA OTTER on 06/23/2014  at00:54.   Electronically Signed   By: Genevive BiStewart  Edmunds M.D.   On: 06/23/2014 00:55    Other results: EKG: Normal sinus rhythm, Q waves in II, III, aVF as well as V4-6.  Assessment & Plan by Problem: Principal Problem:   Acute CVA (cerebrovascular accident) Active Problems:   HLD (hyperlipidemia)   TOBACCO ABUSE   History of CVA (cerebrovascular accident)   Stroke-like symptom  Dizziness: Presenting with impaired adduction of R eye, L sided weakness. Concerning for acute CVA. 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 He has multiple risk factors for stroke including DM2, HLD, HTN, smoking as well as race, sex and family history. CT head demonstrates  no acute infarction or intracranial hemorrhage.  EKG demonstrates no acute ischemic changes. Initial troponin 0.01. -MRI/MRA head pending -Carotid dopplers -Echo -Obtain HgbA1c, fasting lipid panel -PT/OT, Speech consult  -Continue ASA 325mg  daily -Continue home Zocor 40mg  as Lipitor 40mg  daily -Neuro checks, telemetry monitoring  DM2: On metformin 1500mg  QAM and 500mg  QPM at home. -SSI, Accuchecks Q4hr  Schizophrenia: on haldol and Invega. Will need to clarify dosing in AM.  FEN:  -NPO -NS@ 7575ml/hr  DVT ppx: subQ hep  Dispo: Disposition is deferred at this time, awaiting improvement of current medical problems. Anticipated discharge in approximately 1-2 day(s).   The patient does have a current PCP Gara Kroner(Diana Truong, MD) and [facility] after discharge.  The patient does not have transportation limitations that hinder transportation to clinic appointments.  Signed: Griffin BasilJennifer Virdia Ziesmer, MD 06/23/2014, 1:26 AM

## 2014-06-24 DIAGNOSIS — I059 Rheumatic mitral valve disease, unspecified: Secondary | ICD-10-CM

## 2014-06-24 DIAGNOSIS — I639 Cerebral infarction, unspecified: Secondary | ICD-10-CM | POA: Diagnosis not present

## 2014-06-24 LAB — GLUCOSE, CAPILLARY
GLUCOSE-CAPILLARY: 144 mg/dL — AB (ref 70–99)
GLUCOSE-CAPILLARY: 92 mg/dL (ref 70–99)
GLUCOSE-CAPILLARY: 107 mg/dL — AB (ref 70–99)
GLUCOSE-CAPILLARY: 113 mg/dL — AB (ref 70–99)
Glucose-Capillary: 171 mg/dL — ABNORMAL HIGH (ref 70–99)
Glucose-Capillary: 186 mg/dL — ABNORMAL HIGH (ref 70–99)

## 2014-06-24 LAB — HEMOGLOBIN A1C
HEMOGLOBIN A1C: 6.4 % — AB (ref <5.7)
MEAN PLASMA GLUCOSE: 137 mg/dL — AB (ref <117)

## 2014-06-24 MED ORDER — INSULIN ASPART 100 UNIT/ML ~~LOC~~ SOLN
0.0000 [IU] | Freq: Three times a day (TID) | SUBCUTANEOUS | Status: DC
  Administered 2014-06-25: 2 [IU] via SUBCUTANEOUS
  Administered 2014-06-25 – 2014-06-26 (×3): 1 [IU] via SUBCUTANEOUS

## 2014-06-24 NOTE — Evaluation (Signed)
Speech Language Pathology Evaluation Patient Details Name: [name] NeerJames Williams MRN: [id number] DOB: 01-Apr-1956 Today's Date: 06/24/2014 Time: [id number] SLP Time Calculation (min): 10 min  Problem List:  Patient Active Problem List   Diagnosis Date Noted  . Acute CVA (cerebrovascular accident) 06/23/2014  . Stroke-like symptom 06/23/2014  . Preventative health care 08/29/2013  . History of CVA (cerebrovascular accident) 12/01/2009  . HLD (hyperlipidemia) 04/08/2008  . DYSLIPIDEMIA 04/08/2008  . TOBACCO ABUSE 04/08/2008   Past Medical History:  Past Medical History  Diagnosis Date  . Diabetes mellitus   . Hypertension   . History of stroke    Past Surgical History: History reviewed. No pertinent past surgical history. HPI:  58 y.o. male with a history of previous CVA (left pons 2011) who states that he became acutely off balance around midnight 11/8. He also notes that he has double vision and that the double vision has been present for at least a week. Dx likely brainstem CVA.     Assessment / Plan / Recommendation Clinical Impression  Pt with probable brainstem CVA per neuro presents with functional communication, mild CN deficit left CN VII that does not impact speech intelligibility.  No language deficits.  Appears to be at baseline communication function.  He reports no swallowing difficulty and passed the RN stroke swallow screen.  No SLP f/u is warranted.      SLP Assessment  Patient does not need any further Speech Lanaguage Pathology Services          Pertinent Vitals/Pain Pain Assessment: No/denies pain   SLP Goals     SLP Evaluation Prior Functioning  Cognitive/Linguistic Baseline: Within functional limits  Lives With: Family (sister) Vocation: Unemployed   Cognition  Overall Cognitive Status: Within Functional Limits for tasks assessed Orientation Level: Oriented X4    Comprehension  Auditory Comprehension Overall Auditory Comprehension: Appears within  functional limits for tasks assessed Visual Recognition/Discrimination Discrimination: Within Function Limits Reading Comprehension Reading Status: Within funtional limits (baseline)    Expression Expression Primary Mode of Expression: Verbal Verbal Expression Overall Verbal Expression: Appears within functional limits for tasks assessed Written Expression Written Expression: Not tested   Oral / Motor Oral Motor/Sensory Function Overall Oral Motor/Sensory Function:  (mild asymmetry CN VII on left) Motor Speech Overall Motor Speech: Appears within functional limits for tasks assessed   [name] [name], [place] CCC/SLP Pager [phone]      [name], [name] 06/24/2014, 10:53 AM

## 2014-06-24 NOTE — Evaluation (Signed)
Physical Therapy Evaluation Patient Details Name: [name] Williams MRN: [id number] DOB: [birth year] Today's Date: 06/24/2014   History of Present Illness  Patient is a 58 yo male admitted 06/23/14 with Lt-sided weakness, unsteady gait with fall, and diplopia - CN deficit with decreased eye movement. MRI with remote infarct of Rt cerebellum and brainstem.  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  Requires min-mod assist for mobility and gait, with decreased balance and safety.  Will benefit from acute PT to maximize independence prior to discharge.  Recommend Inpatient Rehab consult for comprehensive therapies to maximize functional mobility prior to returning home with sister.    Follow Up Recommendations CIR;Supervision/Assistance - 24 hour    Equipment Recommendations  Rolling walker with 5" wheels    Recommendations for Other Services Rehab consult     Precautions / Restrictions Precautions Precautions: Fall Precaution Comments: Patient did fall at home following onset of dizziness/unsteady gait. Restrictions Weight Bearing Restrictions: No      Mobility  Bed Mobility Overal bed mobility: Needs Assistance Bed Mobility: Supine to Sit;Sit to Supine     Supine to sit: Min assist Sit to supine: Min guard   General bed mobility comments: Verbal cues for technique.  Assist to move trunk to sitting position.  Patient with decreased balance in sitting, leaning to left.  Assist to maintain balance while trying to use urinal in sitting.  Required support for trunk.  UE support needed for static balance.  Transfers Overall transfer level: Needs assistance Equipment used: None;Rolling walker (2 wheeled) Transfers: Sit to/from Stand Sit to Stand: Mod assist         General transfer comment: Verbal cues for technique.  Stood x1 with no assistive device.  Patient required mod assist to maintain  upright balance without UE support, leaning to left side.  Returned to sitting.  Patient then stood with RW for UE support.  Able to maintain static balance with RW with min guard assist.  Ambulation/Gait Ambulation/Gait assistance: Min assist;Mod assist Ambulation Distance (Feet): 24 Feet Assistive device: Rolling walker (2 wheeled) Gait Pattern/deviations: Step-through pattern;Decreased step length - right;Decreased step length - left;Decreased stride length;Decreased weight shift to left;Shuffle;Ataxic;Trunk flexed;Narrow base of support (Leaning to left) Gait velocity: Decreased Gait velocity interpretation: Below normal speed for age/gender General Gait Details: Verbal cues for safe use of RW.  Patient required min assist to maintain balance during gait - leaning to left.  Loss of balance x2 requiring mod assist to prevent fall.  Gait ataxic, with decreased LE control.  Patient running into door jam and chair with RW - question result of vision changes.    Stairs            Wheelchair Mobility    Modified Rankin (Stroke Patients Only) Modified Rankin (Stroke Patients Only) Pre-Morbid Rankin Score: No significant disability Modified Rankin: Moderately severe disability     Balance Overall balance assessment: Needs assistance Sitting-balance support: Single extremity supported;Feet supported Sitting balance-Leahy Scale: Poor Sitting balance - Comments: Required UE support to maintain balance.  Falling to left. Postural control: Left lateral lean Standing balance support: Bilateral upper extremity supported Standing balance-Leahy Scale: Poor Standing balance comment: Patient leaning to left.  Required BUE support on RW to maintain balance.                             Pertinent Vitals/Pain  Pain Assessment: No/denies pain    Home Living Family/patient expects to be discharged to:: Private residence Living Arrangements: Other relatives (Sister) Available Help at  Discharge: Family;Available 24 hours/day Type of Home: House Home Access: Stairs to enter Entrance Stairs-Rails: Doctor, general practiceight;Left Entrance Stairs-Number of Steps: 5 Home Layout: One level Home Equipment: None      Prior Function Level of Independence: Independent         Comments: Independent with ambulation, ADL's.  Sister provides meals and transportation.     Hand Dominance        Extremity/Trunk Assessment   Upper Extremity Assessment: Defer to OT evaluation           Lower Extremity Assessment: RLE deficits/detail;LLE deficits/detail RLE Deficits / Details: Strength grossly WFL, with ataxic movements. LLE Deficits / Details: Strength grossly WFL, with ataxic movements.  Slight increase in tone.     Communication   Communication: No difficulties  Cognition Arousal/Alertness: Awake/alert Behavior During Therapy: Flat affect Overall Cognitive Status: Within Functional Limits for tasks assessed                      General Comments      Exercises        Assessment/Plan    PT Assessment Patient needs continued PT services  PT Diagnosis Difficulty walking;Abnormality of gait;Hemiplegia non-dominant side   PT Problem List Decreased strength;Decreased activity tolerance;Decreased balance;Decreased mobility;Decreased coordination;Decreased knowledge of use of DME;Decreased knowledge of precautions;Impaired tone (Diplopia)  PT Treatment Interventions DME instruction;Gait training;Functional mobility training;Therapeutic activities;Therapeutic exercise;Balance training;Neuromuscular re-education;Patient/family education   PT Goals (Current goals can be found in the Care Plan section) Acute Rehab PT Goals Patient Stated Goal: To get better PT Goal Formulation: With patient Time For Goal Achievement: 07/01/14 Potential to Achieve Goals: Good    Frequency Min 4X/week   Barriers to discharge        Co-evaluation               End of Session  Equipment Utilized During Treatment: Gait belt Activity Tolerance: Patient limited by fatigue Patient left: in bed;with call bell/phone within reach;with bed alarm set Nurse Communication: Mobility status         Time: [id number] PT Time Calculation (min): 20 min   Charges:   PT Evaluation $Initial PT Evaluation Tier I: 1 Procedure PT Treatments $Therapeutic Activity: 8-22 mins   PT G Codes:          Vena AustriaDavis, Avila Albritton H 06/24/2014, 4:01 PM Durenda HurtSusan H. Renaldo Fiddleravis, PT, [facility] Acute Rehab Services Pager [phone]

## 2014-06-24 NOTE — Progress Notes (Signed)
STROKE TEAM PROGRESS NOTE   HISTORY [name] Williams is a 58 y.o. male with a history of previous CVA who states that he became acutely off balance around midnight (06/23/2014 at 2400). He also notes that he has double vision and that the double vision has been present for at least a week. He now also complains of left sided weakness and dizziness that started acutely tonight. Patient was not administered TPA secondary to outside of window. He was admitted for further evaluation and treatment.   SUBJECTIVE (INTERVAL HISTORY) No family is at the bedside.  Overall he feels his condition is unchanged. He is a poor historian and difficulty to get consistent history from him. He stated that he was watching TV and got up to bathroom but suddenly felt dizzy, not vertigo but lightheadedness and fell. Sister brought him to ER. He still has disconjugated eyes, complain of blurry vision but denies double vision, significant dysarthria. MRI did not show acute stroke but multiple previous lacunar stroke. He admits that he had stroke 2.5 years ago but do not remember the symptoms.   OBJECTIVE Temp:  [98.1 F (36.7 C)-98.5 F (36.9 C)] 98.1 F (36.7 C) (11/09 1331) Pulse Rate:  [59-70] 60 (11/09 1331) Cardiac Rhythm:  [-] Normal sinus rhythm (11/09 0800) Resp:  [18-20] 20 (11/09 1331) BP: (146-165)/(73-98) 146/98 mmHg (11/09 1331) SpO2:  [99 %-100 %] 100 % (11/09 1331)   Recent Labs Lab 06/23/14 2023 06/24/14 0013 06/24/14 0402 06/24/14 0736 06/24/14 1142  GLUCAP 145* 186* 144* 92 107*    Recent Labs Lab 06/23/14 0025 06/23/14 0036 06/23/14 0651  NA 140 142 141  K 4.1 3.9 4.3  CL 102 105 104  CO2 26  --  25  GLUCOSE 100* 102* 106*  BUN 12 11 10   CREATININE 1.12 1.20 0.99  CALCIUM 9.5  --  9.1    Recent Labs Lab 06/23/14 0025  AST 23  ALT 15  ALKPHOS 54  BILITOT 0.3  PROT 7.3  ALBUMIN 3.5    Recent Labs Lab 06/23/14 0025 06/23/14 0036 06/23/14 0651  WBC 7.1  --  7.9   NEUTROABS 3.6  --   --   HGB 12.7* 14.6 12.9*  HCT 39.1 43.0 39.6  MCV 86.3  --  85.7  PLT 188  --  165   No results for input(s): CKTOTAL, CKMB, CKMBINDEX, TROPONINI in the last 168 hours.  Recent Labs  06/23/14 0034  LABPROT 13.4  INR 1.01    Recent Labs  06/23/14 0220  COLORURINE YELLOW  LABSPEC 1.014  PHURINE 6.5  GLUCOSEU NEGATIVE  HGBUR NEGATIVE  BILIRUBINUR NEGATIVE  KETONESUR NEGATIVE  PROTEINUR NEGATIVE  UROBILINOGEN 0.2  NITRITE NEGATIVE  LEUKOCYTESUR NEGATIVE       Component Value Date/Time   CHOL 118 06/23/2014 0645   TRIG 44 06/23/2014 0645   HDL 31* 06/23/2014 0645   CHOLHDL 3.8 06/23/2014 0645   VLDL 9 06/23/2014 0645   LDLCALC 78 06/23/2014 0645   Lab Results  Component Value Date   HGBA1C 6.4* 06/23/2014      Component Value Date/Time   LABOPIA NONE DETECTED 06/23/2014 0220   COCAINSCRNUR NONE DETECTED 06/23/2014 0220   LABBENZ NONE DETECTED 06/23/2014 0220   AMPHETMU NONE DETECTED 06/23/2014 0220   THCU POSITIVE* 06/23/2014 0220   LABBARB NONE DETECTED 06/23/2014 0220     Recent Labs Lab 06/23/14 0034  ETH <11    Ct Head Wo Contrast  06/23/2014     IMPRESSION: 1.  No CT evidence of acute infarction. 2. No intracranial hemorrhage. 3. Generalized atrophy and mild microvascular disease are similar to prior.   Mri and Mra Head Wo Contrast  06/23/2014   IMPRESSION: 1. No acute infarct. 2. Remote right cerebellar infarcts with some remote blood products associated. 3. Remote lacunar infarcts of the basal ganglia and brainstem. 4. Atrophy and diffuse white matter disease reflects the sequela of chronic microvascular ischemia. 5. The MRA circle of Willis is moderately degraded by patient motion. 6. Atherosclerotic changes in the cavernous internal carotid arteries bilaterally without definite significant stenosis. 7. Medium and small vessel attenuation diffusely is most prominent in the left MCA branch vessels. This may reflect stenosis within  the intracranial ICA.     Carotid Doppler  Right - 1% to 39% ICA stenosis. There is no evidence of color flow in the right vertebral with only a severely diminished thumping Doppler signal suggestive of a possible occlusion. Left - There is a 40% to 59% ICA stenosis by systolic velocities however the diastolic velocity, ICA/CCA ratio, plaque characterization , and spectral broadening would suggest a 60% to 79% stenosis. Vertebral artery flow is antegrade.  2D Echocardiogram   - Left ventricle: The cavity size was normal. Systolic function was mildly reduced. The estimated ejection fraction was in the range of 45% to 50%. Diffuse hypokinesis. Doppler parameters are consistent with abnormal left ventricular relaxation (grade 1 diastolic dysfunction).  - Mitral valve: There was mild regurgitation. - Right ventricle: The cavity size was mildly dilated. Wallthickness was normal. - Right atrium: The atrium was mildly dilated. - Pulmonary arteries: Systolic pressure was mildly increased. PA peak pressure: 32 mm Hg (S). Impressions:  - No cardiac source of emboli was indentified.   PHYSICAL EXAM Temp:  [98.1 F (36.7 C)-98.5 F (36.9 C)] 98.1 F (36.7 C) (11/09 1331) Pulse Rate:  [59-70] 60 (11/09 1331) Resp:  [18-20] 20 (11/09 1331) BP: (146-165)/(73-98) 146/98 mmHg (11/09 1331) SpO2:  [99 %-100 %] 100 % (11/09 1331)  General - Well nourished, well developed, in no apparent distress.  Ophthalmologic - not cooperative on exam.  Cardiovascular - Regular rate and rhythm with no murmur.  Mental Status -  Level of arousal and orientation to time, place, and person were intact. Language including expression, naming, repetition, comprehension was assessed and found intact. Severe dysarthria.  Cranial Nerves II - XII - II - Visual field intact OU. III, IV, VI - Extraocular movements exam showed INO with right eye impaired adduction, left eye nystagmus with abduction and also limited on up and  downward gaze. disconjugated gaze. V - Facial sensation intact bilaterally. VII - Facial movement intact bilaterally. VIII - Hearing & vestibular intact bilaterally. X - Palate elevates symmetrically, severe dysarthria. XI - Chin turning & shoulder shrug intact bilaterally. XII - Tongue protrusion intact.  Motor Strength - The patient's strength was 5/5 RUE and RLE but 5-/5 LUE and 5/5  LLE and pronator drift was absent.  Bulk was normal and fasciculations were absent.   Motor Tone - Muscle tone was assessed at the neck and appendages and was normal.  Reflexes - The patient's reflexes were 1+ in all extremities and he had no pathological reflexes.  Sensory - Light touch, temperature/pinprick were assessed and were normal.    Coordination - The patient had normal movements in the hands with mild dysmetria.  Tremor was absent.  Gait and Station - not tested.    ASSESSMENT/PLAN Mr. Cleotilde NeerJames Northcraft is a 58 y.o.  male with history of hypertension, type 2 diabetes, hyperlipidemia, and left pontine stroke in 2011 presenting with vertigo x 1 week and blurry vision. He did not receive IV t-PA due to delay in arrival.   Stroke:  Posterior ciruclation DWI-negative infarct secondary to small vessel disease source.  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 Old R cerebellar infarct. Old L pontine infarct.  MRA  Intracranial atherosclerosis  Carotid Doppler  Possible occlusion right vertebral artery, left ICA 40-79% stenosis  Since the ICA stenosis is asymptomatic according to the symptoms and MRI, follow up as outpt with serial carotid doppler is appropriate.  2D Echo  No source of embolus   Heparin 5000 units sq tid for VTE prophylaxis  Diet Carb Modified thin liquids  aspirin 325 mg orally every day prior to admission. Given stroke on aspirin, recommend aspirin 81mg  and Plavix 75 mg daily for 3 months and then  plavix alone after that.  Ongoing aggressive risk factor management  Therapy recommendations:  CIR  Disposition:  CIR, agreed with recommendations for consult  Hypertension  Home meds:  None  Stable  Permissive hypertension (OK if < 220/120) for 24-48 hour and then gradually normalize in 5-7 days  Long term BP goal 120-140, avoid hypotension  Hyperlipidemia  Home meds:  zocor 40, changed to lipitor 40 in hospital  LDL 78, goal < 70  Continue statin at discharge  Diabetes  HgbA1c 6.4 goal < 7.0  Controlled  On Glucophage prior to admission  Tobacco abuse - currently smoker - smoking cessation counseling provided - pt is willing to try  Other Stroke Risk Factors ETOH use THC use Hx stroke/TIA  Other Active Problems  Schizophrenia  Hospital day # 1  Clement J. Zablocki Va Medical CenterHARON BIBY, MSN, APRN, ANVP-BC, AGPCNP-BC [facility] Pager: [phone] 06/24/2014 5:40 PM   I, the attending vascular neurologist, have personally obtained a history, examined the patient, evaluated laboratory data, individually viewed imaging studies, and formulated the assessment and plan of care.  I have made any additions or clarifications directly to the above note and agree with the findings and plan as currently documented.   Please note: All text in blue color in the note is my addition to the original note.   Neurology will sign off. Please call with questions. [facility]. Thanks for the consult.  Marvel PlanJindong Najmah Carradine, MD PhD Stroke Neurology 06/24/2014 5:58 PM  To contact Stroke Continuity provider, please refer to [email or web address]. After hours, contact General Neurology

## 2014-06-24 NOTE — Plan of Care (Signed)
Problem: Acute Treatment Outcomes Goal: Neuro exam at baseline or improved Outcome: Completed/Met Date Met:  06/24/14

## 2014-06-24 NOTE — Progress Notes (Signed)
Subjective: No events overnight. Persistent impairment in adduction- right eye, with what appears to be mild left sided facial droop- persistent.  Objective: Vital signs in last 24 hours: Filed Vitals:   06/23/14 2000 06/24/14 0000 06/24/14 0400 06/24/14 0934  BP: 146/73 164/81 165/85 162/82  Pulse: 63 70 59 64  Temp: 98.1 F (36.7 C) 98.3 F (36.8 C) 98.5 F (36.9 C) 98.2 F (36.8 C)  TempSrc: Oral Oral  Oral  Resp: 20 19 18 20   Height:      Weight:      SpO2: 99% 100% 99% 99%   Weight change:   Intake/Output Summary (Last 24 hours) at 06/24/14 1053 Last data filed at 06/24/14 0956  Gross per 24 hour  Intake      0 ml  Output    725 ml  Net   -725 ml   Physical Exam-  General appearance: Drowsy says he didn't sleep all night, cooperative, appears as stated age and no distress Head: Normocephalic, without obvious abnormality, atraumatic Lungs: clear to auscultation bilaterally, no wheeze or crackle Heart: regular rate and rhythm, S1, S2 normal, no murmur, click, rub or gallop Abdomen: soft, non-tender; bowel sounds normal; no masses,  no organomegaly Extremities: extremities normal, atraumatic, no cyanosis or edema Pulses: 2+ and symmetric  Neuro- 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   Basic Metabolic Panel:  Recent Labs Lab 06/23/14 0025 06/23/14 0036 06/23/14 0651  NA 140 142 141  K 4.1 3.9 4.3  CL 102 105 104  CO2 26  --  25  GLUCOSE 100* 102* 106*  BUN 12 11 10   CREATININE 1.12 1.20 0.99  CALCIUM 9.5  --  9.1   Liver Function Tests:  Recent Labs Lab 06/23/14 0025  AST 23  ALT 15  ALKPHOS 54  BILITOT 0.3  PROT 7.3  ALBUMIN 3.5   CBC:  Recent Labs Lab 06/23/14 0025 06/23/14 0036 06/23/14 0651  WBC 7.1  --  7.9  NEUTROABS 3.6  --   --   HGB 12.7* 14.6 12.9*  HCT 39.1 43.0 39.6    MCV 86.3  --  85.7  PLT 188  --  165   CBG:  Recent Labs Lab 06/23/14 1153 06/23/14 1649 06/23/14 2023 06/24/14 0013 06/24/14 0402 06/24/14 0736  GLUCAP 155* 142* 145* 186* 144* 92   Fasting Lipid Panel:  Recent Labs Lab 06/23/14 0645  CHOL 118  HDL 31*  LDLCALC 78  TRIG 44  CHOLHDL 3.8   Coagulation:  Recent Labs Lab 06/23/14 0034  LABPROT 13.4  INR 1.01   Urine Drug Screen: Drugs of Abuse     Component Value Date/Time   LABOPIA NONE DETECTED 06/23/2014 0220   COCAINSCRNUR NONE DETECTED 06/23/2014 0220   LABBENZ NONE DETECTED 06/23/2014 0220   AMPHETMU NONE DETECTED 06/23/2014 0220   THCU POSITIVE* 06/23/2014 0220   LABBARB NONE DETECTED 06/23/2014 0220    Alcohol Level:  Recent Labs Lab 06/23/14 0034  ETH <11   Urinalysis:  Recent Labs Lab 06/23/14 0220  COLORURINE YELLOW  LABSPEC 1.014  PHURINE 6.5  GLUCOSEU NEGATIVE  HGBUR NEGATIVE  BILIRUBINUR NEGATIVE  KETONESUR NEGATIVE  PROTEINUR NEGATIVE  UROBILINOGEN 0.2  NITRITE NEGATIVE  LEUKOCYTESUR NEGATIVE   Studies/Results: Ct Head Wo Contrast  06/23/2014   CLINICAL DATA:  Could stroke, dizziness, left facial droop  EXAM: CT HEAD WITHOUT  CONTRAST  TECHNIQUE: Contiguous axial images were obtained from the base of the skull through the vertex without intravenous contrast.  COMPARISON:  Brain MRI 11/06/2009, head CT 11/06/2009  FINDINGS: No acute intracranial hemorrhage. No focal mass lesion. No CT evidence of acute infarction. No midline shift or mass effect. No hydrocephalus. Basilar cisterns are patent.  There is generalized cortical atrophy. Mild periventricular white matter hypodensities.  Paranasal sinuses and  mastoid air cells are clear.  IMPRESSION: 1. No CT evidence of acute infarction. 2. No intracranial hemorrhage. 3. Generalized atrophy and mild microvascular disease are similar to prior.  Findings conveyed toOLGA OTTER on 06/23/2014  at00:54.   Electronically Signed   By: Genevive BiStewart   Edmunds M.D.   On: 06/23/2014 00:55   Mr Shirlee LatchMra Head Wo Contrast  06/23/2014   CLINICAL DATA:  Double vision in the left eye for the last week. Stroke like symptoms. Acute loss of balance beginning at midnight last night. Acute onset of left-sided weakness.  EXAM: MRI HEAD WITHOUT CONTRAST  MRA HEAD WITHOUT CONTRAST  TECHNIQUE: Multiplanar, multiecho pulse sequences of the brain and surrounding structures were obtained without intravenous contrast. Angiographic images of the head were obtained using MRA technique without contrast.  COMPARISON:  CT head from the same day.  MRI brain 11/06/2009  FINDINGS: MRI HEAD FINDINGS  Remote right cerebellar infarcts are again noted. The diffusion-weighted images demonstrate no evidence for acute or subacute infarct.  No acute hemorrhage or mass lesion is present. There are remote blood products associated with the inferior right cerebellar infarct.  Remote lacunar infarcts are present within the basal ganglia and thalami bilaterally. There are remote lacunar infarcts in the brainstem.  Flow is present in the major intracranial arteries. The globes and orbits are intact. Mild mucosal thickening is present in the left maxillary sinus and anterior ethmoid air cells bilaterally.  MRA HEAD FINDINGS  The study is moderately degraded by patient motion, limiting evaluation of small vessels. The most significant motion is in the lower slab, which affects the basilar and internal carotid arteries.  Signal loss at the slab overlap is likely artifactual. Atherosclerotic changes are present within the cavernous internal carotid arteries bilaterally without a definite stenosis.  The A1 and M1 segments are intact. The anterior communicating artery is not discretely visualized. The MCA bifurcations are intact. There is asymmetric attenuation of left-sided MCA branch vessels compared to the right.  The left vertebral artery is the dominant vessel. Signal loss in the proximal basilar artery is  artifactual. The distal basilar artery is intact. There is moderate attenuation of distal PCA branch vessels.  IMPRESSION: 1. No acute infarct. 2. Remote right cerebellar infarcts with some remote blood products associated. 3. Remote lacunar infarcts of the basal ganglia and brainstem. 4. Atrophy and diffuse white matter disease reflects the sequela of chronic microvascular ischemia. 5. The MRA circle of Willis is moderately degraded by patient motion. 6. Atherosclerotic changes in the cavernous internal carotid arteries bilaterally without definite significant stenosis. 7. Medium and small vessel attenuation diffusely is most prominent in the left MCA branch vessels. This may reflect stenosis within the intracranial ICA.   Electronically Signed   By: Gennette Pachris  Mattern M.D.   On: 06/23/2014 12:12   [name] Contrast  06/23/2014   CLINICAL DATA:  Double vision in the left eye for the last week. Stroke like symptoms. Acute loss of balance beginning at midnight last night. Acute onset of left-sided weakness.  EXAM: MRI HEAD WITHOUT CONTRAST  MRA HEAD WITHOUT CONTRAST  TECHNIQUE: Multiplanar, multiecho pulse sequences of the brain and surrounding structures were obtained without intravenous contrast. Angiographic images of the head were obtained using MRA technique without contrast.  COMPARISON:  CT head from the same day.  MRI brain 11/06/2009  FINDINGS: MRI HEAD FINDINGS  Remote right cerebellar infarcts are again noted. The diffusion-weighted images demonstrate no evidence for acute or subacute infarct.  No acute hemorrhage or mass lesion is present. There are remote blood products associated with the inferior right cerebellar infarct.  Remote lacunar infarcts are present within the basal ganglia and thalami bilaterally. There are remote lacunar infarcts in the brainstem.  Flow is present in the major intracranial arteries. The globes and orbits are intact. Mild mucosal thickening is present in the left maxillary  sinus and anterior ethmoid air cells bilaterally.  MRA HEAD FINDINGS  The study is moderately degraded by patient motion, limiting evaluation of small vessels. The most significant motion is in the lower slab, which affects the basilar and internal carotid arteries.  Signal loss at the slab overlap is likely artifactual. Atherosclerotic changes are present within the cavernous internal carotid arteries bilaterally without a definite stenosis.  The A1 and M1 segments are intact. The anterior communicating artery is not discretely visualized. The MCA bifurcations are intact. There is asymmetric attenuation of left-sided MCA branch vessels compared to the right.  The left vertebral artery is the dominant vessel. Signal loss in the proximal basilar artery is artifactual. The distal basilar artery is intact. There is moderate attenuation of distal PCA branch vessels.  IMPRESSION: 1. No acute infarct. 2. Remote right cerebellar infarcts with some remote blood products associated. 3. Remote lacunar infarcts of the basal ganglia and brainstem. 4. Atrophy and diffuse white matter disease reflects the sequela of chronic microvascular ischemia. 5. The MRA circle of Willis is moderately degraded by patient motion. 6. Atherosclerotic changes in the cavernous internal carotid arteries bilaterally without definite significant stenosis. 7. Medium and small vessel attenuation diffusely is most prominent in the left MCA branch vessels. This may reflect stenosis within the intracranial ICA.   Electronically Signed   By: Gennette Pac M.D.   On: 06/23/2014 12:12   Scheduled Meds: . aspirin  325 mg Oral Daily  . atorvastatin  40 mg Oral q1800  . heparin  5,000 Units Subcutaneous 3 times per day  . 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 Multiple risk factors for stroke including DM2, HLD, HTN, smoking as well as race, sex and family history. Passed bedside swallow eval. Lipid panel- LDL- 78, total- 118. Last hgbA1c- 6.6. -Neurology consulted recs appreciated.  -Carotid dopplers- Pending -Echo- pending -Obtain HgbA1c- Pending -PT/OT -Continue ASA 325mg  daily -Continue home Zocor 40mg  as Lipitor 40mg  daily -Neuro checks, telemetry monitoring - Follow up with [facility] arranged for 1 week. - Discharge today pending neuro recs.  DM2: On metformin 1500mg  QAM and 500mg  QPM at home. -SSI, Accuchecks Q4hr  Schizophrenia: on haldol and Invega. Will need to clarify dosing in AM.  FEN:  - Carb mod -ND/c IVF @ 72ml/hr  DVT ppx: subQ hep  Dispo: Likely discharge today pending Neurology recs.   The patient does have a current PCP Gara Kroner, MD) and does need an [facility] hospital follow-up appointment after discharge.  The patient does have transportation limitations that hinder transportation to clinic appointments.  .Services Needed at time of discharge: Y = Yes, Blank = No PT:   OT:   RN:   Equipment:   Other:     LOS: 1 day   Onnie Boer, MD 06/24/2014, 10:53 AM

## 2014-06-24 NOTE — Progress Notes (Signed)
CARE MANAGEMENT NOTE 06/24/2014  Patient:  [name] Williams,[name] Williams   Account Number:  [id number]  Date Initiated:  06/24/2014  Documentation initiated by:  Jiles CrockerHANDLER,Telisa Ohlsen  Subjective/Objective Assessment:   ADMITTED FOR STROKE WORKUP     Action/Plan:   CM FOLLOWING FOR DCP   Anticipated DC Date:  06/27/2014   Anticipated DC Plan:  AWAITING FOR PT/OT EVALS FOR DISPOSITION NEEDS     DC Planning Services  CM consult          Status of service:  In process, will continue to follow Medicare Important Message given?   (If response is "NO", the following Medicare IM given date fields will be blank)  Per UR Regulation:  Reviewed for med. necessity/level of care/duration of stay  Comments:  11/9/2015Abelino Derrick- B Ziyan Hillmer RN,BSN,MHA [phone]

## 2014-06-24 NOTE — Progress Notes (Signed)
  Echocardiogram 2D Echocardiogram has been performed.  [name] Williams [name] Williams 06/24/2014, 12:25 PM

## 2014-06-24 NOTE — Progress Notes (Signed)
Rehab Admissions Coordinator Note:  Patient was screened by Trish MageLogue, Benard Minturn M for appropriateness for an Inpatient Acute Rehab Consult.  At this time, we are recommending Inpatient Rehab consult.  Trish MageLogue, Neala Miggins M 06/24/2014, 4:14 PM  I can be reached at [phone].

## 2014-06-24 NOTE — Progress Notes (Signed)
Subjective:  Patient reports no new symptoms overnight except for some transient burning and tingling in his feet which he has experienced before. He denies any new onset weakness, pain, confusion, headaches, or other focal neurological symptoms.   Objective: Vital signs in last 24 hours: Filed Vitals:   06/23/14 1600 06/23/14 2000 06/24/14 0000 06/24/14 0400  BP: 164/87 146/73 164/81 165/85  Pulse: 64 63 70 59  Temp: 98.1 F (36.7 C) 98.1 F (36.7 C) 98.3 F (36.8 C) 98.5 F (36.9 C)  TempSrc: Oral Oral Oral   Resp: 20 20 19 18   Height:      Weight:      SpO2: 99% 99% 100% 99%   Weight change:   Intake/Output Summary (Last 24 hours) at 06/24/14 0826 Last data filed at 06/24/14 0818  Gross per 24 hour  Intake      0 ml  Output    875 ml  Net   -875 ml   General appearance: alert, cooperative and no distress Head: Normocephalic, without obvious abnormality, atraumatic Eyes: positive findings: arcus senilis bilaterally Neck: no carotid bruit, no JVD and supple, symmetrical, trachea midline Lungs: some coarse breath sounds bilaterally in the lower lobes, otherwise clear to auscultation Heart: regular rate and rhythm, S1, S2 normal, no murmur, click, rub or gallop Abdomen: soft, non-tender; bowel sounds normal; no masses,  no organomegaly Extremities: extremities normal, atraumatic, no cyanosis or edema Neurologic: A&O x 3, Mild dysarthria, strength 4+/5 in in RUE and RLE, 4+/5 in LUE and LLE. DTR diminished (1+) in right patellar, otherwise nml. Finger-nose test slow. Impaired R eye ADDuction with concomitant L eye nystagmus. Minor left facial droop. Otherwise cranial nerves intact. Patient has a tendency to lean backwards while sitting on the bed, drifting further over time.  Lab Results: I have reviewed all the lab results.  Micro Results: No results found for this or any previous visit (from the past 240 hour(s)). Studies/Results: Ct Head Wo Contrast  06/23/2014    CLINICAL DATA:  Could stroke, dizziness, left facial droop  EXAM: CT HEAD WITHOUT CONTRAST  TECHNIQUE: Contiguous axial images were obtained from the base of the skull through the vertex without intravenous contrast.  COMPARISON:  Brain MRI 11/06/2009, head CT 11/06/2009  FINDINGS: No acute intracranial hemorrhage. No focal mass lesion. No CT evidence of acute infarction. No midline shift or mass effect. No hydrocephalus. Basilar cisterns are patent.  There is generalized cortical atrophy. Mild periventricular white matter hypodensities.  Paranasal sinuses and  mastoid air cells are clear.  IMPRESSION: 1. No CT evidence of acute infarction. 2. No intracranial hemorrhage. 3. Generalized atrophy and mild microvascular disease are similar to prior.  Findings conveyed toOLGA OTTER on 06/23/2014  at00:54.   Electronically Signed   By: Genevive BiStewart  Edmunds M.D.   On: 06/23/2014 00:55   Mr Shirlee LatchMra Head Wo Contrast  06/23/2014   CLINICAL DATA:  Double vision in the left eye for the last week. Stroke like symptoms. Acute loss of balance beginning at midnight last night. Acute onset of left-sided weakness.  EXAM: MRI HEAD WITHOUT CONTRAST  MRA HEAD WITHOUT CONTRAST  TECHNIQUE: Multiplanar, multiecho pulse sequences of the brain and surrounding structures were obtained without intravenous contrast. Angiographic images of the head were obtained using MRA technique without contrast.  COMPARISON:  CT head from the same day.  MRI brain 11/06/2009  FINDINGS: MRI HEAD FINDINGS  Remote right cerebellar infarcts are again noted. The diffusion-weighted images demonstrate no evidence for acute or  subacute infarct.  No acute hemorrhage or mass lesion is present. There are remote blood products associated with the inferior right cerebellar infarct.  Remote lacunar infarcts are present within the basal ganglia and thalami bilaterally. There are remote lacunar infarcts in the brainstem.  Flow is present in the major intracranial arteries. The  globes and orbits are intact. Mild mucosal thickening is present in the left maxillary sinus and anterior ethmoid air cells bilaterally.  MRA HEAD FINDINGS  The study is moderately degraded by patient motion, limiting evaluation of small vessels. The most significant motion is in the lower slab, which affects the basilar and internal carotid arteries.  Signal loss at the slab overlap is likely artifactual. Atherosclerotic changes are present within the cavernous internal carotid arteries bilaterally without a definite stenosis.  The A1 and M1 segments are intact. The anterior communicating artery is not discretely visualized. The MCA bifurcations are intact. There is asymmetric attenuation of left-sided MCA branch vessels compared to the right.  The left vertebral artery is the dominant vessel. Signal loss in the proximal basilar artery is artifactual. The distal basilar artery is intact. There is moderate attenuation of distal PCA branch vessels.  IMPRESSION: 1. No acute infarct. 2. Remote right cerebellar infarcts with some remote blood products associated. 3. Remote lacunar infarcts of the basal ganglia and brainstem. 4. Atrophy and diffuse white matter disease reflects the sequela of chronic microvascular ischemia. 5. The MRA circle of Willis is moderately degraded by patient motion. 6. Atherosclerotic changes in the cavernous internal carotid arteries bilaterally without definite significant stenosis. 7. Medium and small vessel attenuation diffusely is most prominent in the left MCA branch vessels. This may reflect stenosis within the intracranial ICA.   Electronically Signed   By: Gennette Pachris  Mattern M.D.   On: 06/23/2014 12:12   [name] Contrast  06/23/2014   CLINICAL DATA:  Double vision in the left eye for the last week. Stroke like symptoms. Acute loss of balance beginning at midnight last night. Acute onset of left-sided weakness.  EXAM: MRI HEAD WITHOUT CONTRAST  MRA HEAD WITHOUT CONTRAST  TECHNIQUE:  Multiplanar, multiecho pulse sequences of the brain and surrounding structures were obtained without intravenous contrast. Angiographic images of the head were obtained using MRA technique without contrast.  COMPARISON:  CT head from the same day.  MRI brain 11/06/2009  FINDINGS: MRI HEAD FINDINGS  Remote right cerebellar infarcts are again noted. The diffusion-weighted images demonstrate no evidence for acute or subacute infarct.  No acute hemorrhage or mass lesion is present. There are remote blood products associated with the inferior right cerebellar infarct.  Remote lacunar infarcts are present within the basal ganglia and thalami bilaterally. There are remote lacunar infarcts in the brainstem.  Flow is present in the major intracranial arteries. The globes and orbits are intact. Mild mucosal thickening is present in the left maxillary sinus and anterior ethmoid air cells bilaterally.  MRA HEAD FINDINGS  The study is moderately degraded by patient motion, limiting evaluation of small vessels. The most significant motion is in the lower slab, which affects the basilar and internal carotid arteries.  Signal loss at the slab overlap is likely artifactual. Atherosclerotic changes are present within the cavernous internal carotid arteries bilaterally without a definite stenosis.  The A1 and M1 segments are intact. The anterior communicating artery is not discretely visualized. The MCA bifurcations are intact. There is asymmetric attenuation of left-sided MCA branch vessels compared to the right.  The left vertebral artery  is the dominant vessel. Signal loss in the proximal basilar artery is artifactual. The distal basilar artery is intact. There is moderate attenuation of distal PCA branch vessels.  IMPRESSION: 1. No acute infarct. 2. Remote right cerebellar infarcts with some remote blood products associated. 3. Remote lacunar infarcts of the basal ganglia and brainstem. 4. Atrophy and diffuse white matter disease  reflects the sequela of chronic microvascular ischemia. 5. The MRA circle of Willis is moderately degraded by patient motion. 6. Atherosclerotic changes in the cavernous internal carotid arteries bilaterally without definite significant stenosis. 7. Medium and small vessel attenuation diffusely is most prominent in the left MCA branch vessels. This may reflect stenosis within the intracranial ICA.   Electronically Signed   By: Gennette Pac M.D.   On: 06/23/2014 12:12   Medications: I have reviewed the patient's current medications. Scheduled Meds: . aspirin  325 mg Oral Daily  . atorvastatin  40 mg Oral q1800  . heparin  5,000 Units Subcutaneous 3 times per day  . insulin aspart  0-9 Units Subcutaneous 6 times per day  . pneumococcal 23 valent vaccine  0.5 mL Intramuscular Tomorrow-1000   Continuous Infusions: . sodium chloride 1,000 mL (06/24/14 0655)   PRN Meds:.senna-docusate   Assessment/Plan: Principal Problem:   Acute CVA (cerebrovascular accident) Active Problems:   HLD (hyperlipidemia)   TOBACCO ABUSE   History of CVA (cerebrovascular accident)   Stroke-like symptom  Dizziness / Opthalmoplegia: Presenting with impaired adduction of R eye, L sided weakness. Concerning for acute CVA however MRI only showed remote strokes. 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 He has multiple risk factors for stroke including DM2, HLD, HTN, smoking as well as race, sex and family history. CT head demonstrated no acute infarction or intracranial hemorrhage. MRI also showed no acute stroke but remote evidence of stroke in the right cerebellum and lacunar basal ganglia and brainstem (Full results above). EKG demonstrates no acute ischemic changes. Initial troponin 0.01. Lipid panel showed low HDL 31, LDL 78, total 118, awaiting other results.  -Neuro consult, appreciate recs, if no need for further stroke workup can discharge today -Carotid dopplers - Pending -Echo - Pending -ASA 325mg   daily -Lipitor 40mg  daily -Passed swallow eval  DM2: On metformin 1500mg  QAM and 500mg  QPM at home. A1c at 6.4 (11/8). -SSI, Accuchecks Q4hr  Schizophrenia: On home haloperidol and risperidone (Invega) -Holding for now  DVT ppx: subQ hep  Dispo: Disposition is deferred at this time, awaiting improvement of current medical problems. Anticipated discharge in approximately today.   The patient does have a current PCP Gara Kroner, MD) and does need an [facility] hospital follow-up appointment after discharge.  The patient does not have transportation limitations that hinder transportation to clinic appointments.  This is a Psychologist, occupational Note.  The care of the patient was discussed with Dr. Pete Pelt and the assessment and plan formulated with their assistance.  Please see their attached note for official documentation of the daily encounter.   LOS: 1 day   Tamera Punt, Med Student 06/24/2014, 8:26 AM

## 2014-06-24 NOTE — Progress Notes (Signed)
VASCULAR LAB PRELIMINARY  PRELIMINARY  PRELIMINARY  PRELIMINARY  Carotid duplex completed.    Preliminary report:  Right  - 1% to 39% ICA stenosis. There is no evidence of color flow in the right vertebral with only a severely diminished thumping Doppler signal suggestive of a possible occlusion. Left -  There is a 40% to 59% ICA stenosis by systolic velocities however the diastolic velocity, ICA/CCA ratio, plaque characterization , and spectral broadening would suggest a 60% to 79% stenosis. Vertebral artery flow is antegrade.  Alexie Lanni, RVS 06/24/2014, 1:07 PM

## 2014-06-24 NOTE — Plan of Care (Signed)
Problem: Acute Treatment Outcomes Goal: Airway maintained/protected Outcome: Completed/Met Date Met:  06/24/14

## 2014-06-25 DIAGNOSIS — H5120 Internuclear ophthalmoplegia, unspecified eye: Secondary | ICD-10-CM

## 2014-06-25 DIAGNOSIS — R4189 Other symptoms and signs involving cognitive functions and awareness: Secondary | ICD-10-CM

## 2014-06-25 DIAGNOSIS — H532 Diplopia: Secondary | ICD-10-CM

## 2014-06-25 DIAGNOSIS — I639 Cerebral infarction, unspecified: Principal | ICD-10-CM

## 2014-06-25 LAB — GLUCOSE, CAPILLARY
GLUCOSE-CAPILLARY: 131 mg/dL — AB (ref 70–99)
GLUCOSE-CAPILLARY: 151 mg/dL — AB (ref 70–99)
Glucose-Capillary: 142 mg/dL — ABNORMAL HIGH (ref 70–99)
Glucose-Capillary: 164 mg/dL — ABNORMAL HIGH (ref 70–99)

## 2014-06-25 MED ORDER — LISINOPRIL 10 MG PO TABS
10.0000 mg | ORAL_TABLET | Freq: Every day | ORAL | Status: DC
  Administered 2014-06-25 – 2014-06-26 (×2): 10 mg via ORAL
  Filled 2014-06-25 (×2): qty 1

## 2014-06-25 NOTE — Consult Note (Signed)
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 Patient lives with his sister and was independent prior to admission. Admitted 06/23/2014 with increased left-sided weakness, slurred speech and blurred vision . 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 Neurology follow-up suspect posterior circulation infarcts secondary to small vessel disease.echocardiogram with ejection fraction of 50% grade 1 diastolic dysfunction. Carotid Dopplers with left 40-59% ICA stenosis. Urine drug screen upon admission positive marijuana. Patient did not receive TPA. Neurology services consulted presently maintained on aspirin 325 mg daily for CVA prophylaxis as well as subcutaneous heparin for DVT prophylaxis. Patient is tolerating a regular consistency diet. Physical therapy evaluation completed 06/24/2014 with recommendations of physical medicine rehabilitation consult.   Review of Systems  Eyes: Positive for blurred vision.  Gastrointestinal: Positive for constipation.  Musculoskeletal: Positive for myalgias.  Psychiatric/Behavioral:       Schizophrenia, anxiety   Past Medical History  Diagnosis Date  . Diabetes mellitus   . Hypertension   . History of stroke    History reviewed. No pertinent past surgical history. Family History  Problem Relation Age of Onset  . Hypertension Mother   . Hypertension Father   . Coronary artery disease Maternal Aunt   . Diabetes Maternal Aunt   . Coronary artery disease Maternal Uncle   . Diabetes Maternal Uncle   .  Diabetes Paternal Aunt   . Coronary artery disease Paternal Aunt   . Diabetes Paternal Uncle   . Coronary artery disease Paternal Uncle    Social History:  reports that he has been smoking Cigarettes.  He has a 1 pack-year smoking history. He does not have any smokeless tobacco history on file. He reports that he drinks alcohol. He reports that he uses illicit drugs. Allergies: No Known Allergies Medications Prior to Admission  Medication Sig Dispense Refill  . metFORMIN (GLUCOPHAGE) 1000 MG tablet Take 1,000 mg by mouth daily with breakfast.    . aspirin 325 MG EC tablet Take 325 mg by mouth daily.      . haloperidol lactate (HALDOL) 5 MG/ML injection every 6 (six) hours as needed. Unsure of dosing, pt gets from mental health q 4 weeks chronically.     Hinda Glatter. INVEGA SUSTENNA 117 MG/0.75ML SUSP     . metFORMIN (GLUCOPHAGE) 1000 MG tablet TAKE 0.5 TABLETS (500 MG TOTAL) BY MOUTH 2 (TWO) TIMES DAILY WITH A MEAL. 60 tablet 3  . simvastatin (ZOCOR) 40 MG tablet Take 1 tablet (40 mg total) by mouth every evening. 30 tablet 11    Home: Home Living Family/patient expects to be discharged to:: Private residence Living Arrangements: Other relatives (Sister) Available Help at Discharge: Family, Available 24 hours/day Type of Home: House Home Access: Stairs to enter Entergy CorporationEntrance Stairs-Number of Steps: 5 Entrance Stairs-Rails: Right, Left Home Layout: One level Home Equipment: None  Lives With: Family (Sister)  Functional History: Prior Function Level of Independence: Independent Comments: Independent with ambulation, ADL's.  Sister provides meals and transportation. Functional Status:  Mobility: Bed Mobility Overal bed mobility: Needs Assistance Bed Mobility: Supine to Sit, Sit to Supine Supine to sit: Min assist Sit to supine: Min guard General bed  mobility comments: Verbal cues for technique.  Assist to move trunk to sitting position.  Patient with decreased balance in sitting, leaning to  left.  Assist to maintain balance while trying to use urinal in sitting.  Required support for trunk.  UE support needed for static balance. Transfers Overall transfer level: Needs assistance Equipment used: None, Rolling walker (2 wheeled) Transfers: Sit to/from Stand Sit to Stand: Mod assist General transfer comment: Verbal cues for technique.  Stood x1 with no assistive device.  Patient required mod assist to maintain upright balance without UE support, leaning to left side.  Returned to sitting.  Patient then stood with RW for UE support.  Able to maintain static balance with RW with min guard assist. Ambulation/Gait Ambulation/Gait assistance: Min assist, Mod assist Ambulation Distance (Feet): 24 Feet Assistive device: Rolling walker (2 wheeled) Gait Pattern/deviations: Step-through pattern, Decreased step length - right, Decreased step length - left, Decreased stride length, Decreased weight shift to left, Shuffle, Ataxic, Trunk flexed, Narrow base of support (Leaning to left) Gait velocity: Decreased Gait velocity interpretation: Below normal speed for age/gender General Gait Details: Verbal cues for safe use of RW.  Patient required min assist to maintain balance during gait - leaning to left.  Loss of balance x2 requiring mod assist to prevent fall.  Gait ataxic, with decreased LE control.  Patient running into door jam and chair with RW - question result of vision changes.      ADL:    Cognition: Cognition Overall Cognitive Status: Within Functional Limits for tasks assessed Orientation Level: Oriented X4 Cognition Arousal/Alertness: Awake/alert Behavior During Therapy: Flat affect Overall Cognitive Status: Within Functional Limits for tasks assessed  Blood pressure 152/76, pulse 70, temperature 99.3 F (37.4 C), temperature source Oral, resp. rate 20, height 5\' 10"  (1.778 m), weight 74.617 kg (164 lb 8 oz), SpO2 99 %. Physical Exam  Constitutional: He appears  well-developed.  Eyes:  Pupils reactive to light. Left eye with abduction and limited upward and downward gaze  Neck: Normal range of motion. Neck supple. No thyromegaly present.  Cardiovascular: Normal rate and regular rhythm.   Respiratory: Effort normal and breath sounds normal. No respiratory distress.  GI: Soft. Bowel sounds are normal. He exhibits no distension.  Neurological: He is alert.  Patient is oriented to person, place and date of birth as well as his age. He is a very limited medical historian. He does follow simple commands. Dysconjugate gaze, INO. Speech slurred. Sensation 1/2 left face/arm/leg. LUE 3/5. LLE 2+ to 3/5 proximal to distal.     Results for orders placed or performed during the hospital encounter of 06/23/14 (from the past 24 hour(s))  Glucose, capillary     Status: None   Collection Time: 06/24/14  7:36 AM  Result Value Ref Range   Glucose-Capillary 92 70 - 99 mg/dL   Comment 1 Notify RN    Comment 2 Documented in Chart   Glucose, capillary     Status: Abnormal   Collection Time: 06/24/14 11:42 AM  Result Value Ref Range   Glucose-Capillary 107 (H) 70 - 99 mg/dL   Comment 1 Notify RN    Comment 2 Documented in Chart   Glucose, capillary     Status: Abnormal   Collection Time: 06/24/14  5:34 PM  Result Value Ref Range   Glucose-Capillary 113 (H) 70 - 99 mg/dL  Glucose, capillary     Status: Abnormal   Collection Time: 06/24/14 10:18 PM  Result Value Ref Range  Glucose-Capillary 171 (H) 70 - 99 mg/dL   Comment 1 Repeat Test    Comment 2 Documented in Chart    Comment 3 Notify RN    Mr Shirlee Latch Wo Contrast  06/23/2014   CLINICAL DATA:  Double vision in the left eye for the last week. Stroke like symptoms. Acute loss of balance beginning at midnight last night. Acute onset of left-sided weakness.  EXAM: MRI HEAD WITHOUT CONTRAST  MRA HEAD WITHOUT CONTRAST  TECHNIQUE: Multiplanar, multiecho pulse sequences of the brain and surrounding structures were  obtained without intravenous contrast. Angiographic images of the head were obtained using MRA technique without contrast.  COMPARISON:  CT head from the same day.  MRI brain 11/06/2009  FINDINGS: MRI HEAD FINDINGS  Remote right cerebellar infarcts are again noted. The diffusion-weighted images demonstrate no evidence for acute or subacute infarct.  No acute hemorrhage or mass lesion is present. There are remote blood products associated with the inferior right cerebellar infarct.  Remote lacunar infarcts are present within the basal ganglia and thalami bilaterally. There are remote lacunar infarcts in the brainstem.  Flow is present in the major intracranial arteries. The globes and orbits are intact. Mild mucosal thickening is present in the left maxillary sinus and anterior ethmoid air cells bilaterally.  MRA HEAD FINDINGS  The study is moderately degraded by patient motion, limiting evaluation of small vessels. The most significant motion is in the lower slab, which affects the basilar and internal carotid arteries.  Signal loss at the slab overlap is likely artifactual. Atherosclerotic changes are present within the cavernous internal carotid arteries bilaterally without a definite stenosis.  The A1 and M1 segments are intact. The anterior communicating artery is not discretely visualized. The MCA bifurcations are intact. There is asymmetric attenuation of left-sided MCA branch vessels compared to the right.  The left vertebral artery is the dominant vessel. Signal loss in the proximal basilar artery is artifactual. The distal basilar artery is intact. There is moderate attenuation of distal PCA branch vessels.  IMPRESSION: 1. No acute infarct. 2. Remote right cerebellar infarcts with some remote blood products associated. 3. Remote lacunar infarcts of the basal ganglia and brainstem. 4. Atrophy and diffuse white matter disease reflects the sequela of chronic microvascular ischemia. 5. The MRA circle of Willis  is moderately degraded by patient motion. 6. Atherosclerotic changes in the cavernous internal carotid arteries bilaterally without definite significant stenosis. 7. Medium and small vessel attenuation diffusely is most prominent in the left MCA branch vessels. This may reflect stenosis within the intracranial ICA.   Electronically Signed   By: Gennette Pac M.D.   On: 06/23/2014 12:12   [name] Contrast  06/23/2014   CLINICAL DATA:  Double vision in the left eye for the last week. Stroke like symptoms. Acute loss of balance beginning at midnight last night. Acute onset of left-sided weakness.  EXAM: MRI HEAD WITHOUT CONTRAST  MRA HEAD WITHOUT CONTRAST  TECHNIQUE: Multiplanar, multiecho pulse sequences of the brain and surrounding structures were obtained without intravenous contrast. Angiographic images of the head were obtained using MRA technique without contrast.  COMPARISON:  CT head from the same day.  MRI brain 11/06/2009  FINDINGS: MRI HEAD FINDINGS  Remote right cerebellar infarcts are again noted. The diffusion-weighted images demonstrate no evidence for acute or subacute infarct.  No acute hemorrhage or mass lesion is present. There are remote blood products associated with the inferior right cerebellar infarct.  Remote lacunar infarcts are present  within the basal ganglia and thalami bilaterally. There are remote lacunar infarcts in the brainstem.  Flow is present in the major intracranial arteries. The globes and orbits are intact. Mild mucosal thickening is present in the left maxillary sinus and anterior ethmoid air cells bilaterally.  MRA HEAD FINDINGS  The study is moderately degraded by patient motion, limiting evaluation of small vessels. The most significant motion is in the lower slab, which affects the basilar and internal carotid arteries.  Signal loss at the slab overlap is likely artifactual. Atherosclerotic changes are present within the cavernous internal carotid arteries  bilaterally without a definite stenosis.  The A1 and M1 segments are intact. The anterior communicating artery is not discretely visualized. The MCA bifurcations are intact. There is asymmetric attenuation of left-sided MCA branch vessels compared to the right.  The left vertebral artery is the dominant vessel. Signal loss in the proximal basilar artery is artifactual. The distal basilar artery is intact. There is moderate attenuation of distal PCA branch vessels.  IMPRESSION: 1. No acute infarct. 2. Remote right cerebellar infarcts with some remote blood products associated. 3. Remote lacunar infarcts of the basal ganglia and brainstem. 4. Atrophy and diffuse white matter disease reflects the sequela of chronic microvascular ischemia. 5. The MRA circle of Willis is moderately degraded by patient motion. 6. Atherosclerotic changes in the cavernous internal carotid arteries bilaterally without definite significant stenosis. 7. Medium and small vessel attenuation diffusely is most prominent in the left MCA branch vessels. This may reflect stenosis within the intracranial ICA.   Electronically Signed   By: Gennette Pachris  Mattern M.D.   On: 06/23/2014 12:12    Assessment/Plan: Diagnosis: new ? Brainstem  And/or posterior circulation infarcts with impaired balance, left sided sensation and visual spatial deficits 1. Does the need for close, 24 hr/day medical supervision in concert with the patient's rehab needs make it unreasonable for this patient to be served in a less intensive setting? Yes 2. Co-Morbidities requiring supervision/potential complications: drug abuse,  3. Due to bladder management, bowel management, safety, skin/wound care, disease management, medication administration, pain management and patient education, does the patient require 24 hr/day rehab nursing? Yes 4. Does the patient require coordinated care of a physician, rehab nurse, PT (1-2 hrs/day, 5 days/week), OT (1-2 hrs/day, 5 days/week) and SLP  (1-2 hrs/day, 5 days/week) to address physical and functional deficits in the context of the above medical diagnosis(es)? Yes Addressing deficits in the following areas: balance, endurance, locomotion, strength, transferring, bowel/bladder control, bathing, dressing, feeding, grooming, toileting, cognition, speech and psychosocial support 5. Can the patient actively participate in an intensive therapy program of at least 3 hrs of therapy per day at least 5 days per week? Yes 6. The potential for patient to make measurable gains while on inpatient rehab is good 7. Anticipated functional outcomes upon discharge from inpatient rehab are supervision  with PT, supervision and min assist with OT, supervision with SLP. 8. Estimated rehab length of stay to reach the above functional goals is: 7 days+ 9. Does the patient have adequate social supports to accommodate these discharge functional goals? Yes 10. Anticipated D/C setting: Home 11. Anticipated post D/C treatments: HH therapy and Outpatient therapy 12. Overall Rehab/Functional Prognosis: good  RECOMMENDATIONS: This patient's condition is appropriate for continued rehabilitative care in the following setting: CIR Patient has agreed to participate in recommended program. Yes Note that insurance prior authorization may be required for reimbursement for recommended care.  Comment: Rehab Admissions Coordinator to follow up.  Thanks,  Ranelle Oyster, MD, [place] [facility]     06/25/2014

## 2014-06-25 NOTE — Progress Notes (Signed)
Physical Therapy Treatment Patient Details Name: [name] NeerJames Kozel MRN: [id number] DOB: 01-08-1956 Today's Date: 06/25/2014    History of Present Illness 58 yo male admitted with L side weakness , unsteady gait, diplopia- CN deficit with decreased eye movement. MRI remote R cerebellum and brainstem ([facility]). PMH: Cva 2011 with CIR admission R side weakness, HTN, DM, HLD, schixophrenia    PT Comments    Patient able to progress with use of RW and ambulation this session. Continues with L side lean and work on static standing this session with use of RW. Continue to recommend comprehensive inpatient rehab (CIR) for post-acute therapy needs.   Follow Up Recommendations  CIR;Supervision/Assistance - 24 hour     Equipment Recommendations  Rolling walker with 5" wheels    Recommendations for Other Services       Precautions / Restrictions Precautions Precautions: Fall Precaution Comments: s/p fall at home with dizziness and unsteady gait Restrictions Weight Bearing Restrictions: No    Mobility  Bed Mobility Overal bed mobility: Needs Assistance Bed Mobility: Supine to Sit     Supine to sit: Min assist     General bed mobility comments: Patient up in recliner before and after session  Transfers Overall transfer level: Needs assistance Equipment used: Rolling walker (2 wheeled) Transfers: Sit to/from Stand Sit to Stand: Mod assist         General transfer comment: Mod A initially for first stand to power up into standing and to ensure anterior weight shift as patient tends to put increased weight posterior. Stood x5 needing only min A for gain balance but continued with posterior and left side lean. Able to self adjust more with use of RW. A to place hand on RW  Ambulation/Gait Ambulation/Gait assistance: Min assist Ambulation Distance (Feet): 80 Feet Assistive device: Rolling walker (2 wheeled) Gait Pattern/deviations: Step-to pattern;Decreased step length - left;Decreased  stride length;Decreased weight shift to left;Narrow base of support Gait velocity: Decreased Gait velocity interpretation: Below normal speed for age/gender General Gait Details: Verbal cues for safe use of RW.  Patient required min assist to maintain balance during gait - leaning to left.  No LOB noted this session but did require min A for hip/shoulder facilitation to R side. A for RW management and to avoid obstacles on L side   Stairs            Wheelchair Mobility    Modified Rankin (Stroke Patients Only)       Balance Overall balance assessment: Needs assistance Sitting-balance support: No upper extremity supported;Feet supported Sitting balance-Leahy Scale: Zero Sitting balance - Comments: immediate LOB posterior lateral Postural control: Posterior lean;Left lateral lean Standing balance support: Bilateral upper extremity supported;During functional activity Standing balance-Leahy Scale: Poor                      Cognition Arousal/Alertness: Awake/alert Behavior During Therapy: Flat affect Overall Cognitive Status: Within Functional Limits for tasks assessed                      Exercises      General Comments        Pertinent Vitals/Pain Pain Assessment: No/denies pain    Home Living Family/patient expects to be discharged to:: Private residence Living Arrangements: Other relatives Available Help at Discharge: Family;Available 24 hours/day Type of Home: House Home Access: Stairs to enter Entrance Stairs-Rails: Right;Left Home Layout: One level Home Equipment: None      Prior Function Level of  Independence: Independent      Comments: Independent with ambulation, ADL's.  Sister provides meals and transportation.   PT Goals (current goals can now be found in the care plan section) Acute Rehab PT Goals Patient Stated Goal: none specifically stated Progress towards PT goals: Progressing toward goals    Frequency  Min 4X/week     PT Plan Current plan remains appropriate    Co-evaluation             End of Session Equipment Utilized During Treatment: Gait belt Activity Tolerance: Patient tolerated treatment well;Patient limited by fatigue Patient left: in chair;with call bell/phone within reach;with family/visitor present;with chair alarm set     Time: [id number] PT Time Calculation (min) (ACUTE ONLY): 18 min  Charges:  $Gait Training: 8-22 mins                    G Codes:      Fredrich BirksRobinette, Ria Redcay Elizabeth 06/25/2014, 11:32 AM  06/25/2014 Fredrich Birksobinette, Emanuelle Hammerstrom Elizabeth PTA [phone] pager [phone] office

## 2014-06-25 NOTE — Progress Notes (Addendum)
Inpatient Rehabilitation  I met with Mr. Kendrix at the bedside to discuss post acute rehab options.  Pt. Indicates he is alone much of the day when at home.  Dr. Naaman Plummer has indicated pt. Will likely need 24 hour supervision if he comes to CIR for rehab.  I am attempting to reach pt's sister [name] Elahi.  Home phone number appears to no longer be in service [phone]).  There is a cell phone number on pt's dry erase board for [name] [phone]).   I left a voicemail for her to call me back to establish rehab and DC plans. Will update team when I am able to connect with [name] Marker. Rehab admission would be dependent on medical readiness,  bed availability and wishes of family.    I discussed the above with Olga Coaster CM.  Please call if questions.   Addendum (1510)  [name] returned my call and says pt. has been to CIR in the past and that she prefers he come back to CIR if bed becomes available. I  will see pt. In the am and will know at that time my bed availability status for Wednesday.  Would suggest SNF back up plan in case CIR bed not available at time of medical readiness.  Please call if questions.     [facility] Admissions Coordinator Cell [phone] Office [phone]

## 2014-06-25 NOTE — Evaluation (Signed)
Occupational Therapy Evaluation Patient Details Name: [name] Williams MRN: [id number] DOB: 11-12-1955 Today's Date: 06/25/2014    History of Present Illness 58 yo male admitted with L side weakness , unsteady gait, diplopia- CN deficit with decreased eye movement. MRI remote R cerebellum and brainstem ([facility]). 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 Pt currently with functional limitiations due to the deficits listed below (see OT problem list). PTA living at home MOD I with sister. Pt with hx of CVA and CIR admission in 2011. Pt will benefit from skilled OT to increase their independence and safety with adls and balance to allow discharge CIR. Ot to follow acutely for diplopia/ nystagmus visual changes, Adls with static standing balance and basic transfer.     Follow Up Recommendations  CIR    Equipment Recommendations  Other (comment) (defer)    Recommendations for Other Services Rehab consult     Precautions / Restrictions Precautions Precautions: Fall Precaution Comments: s/p fall at home with dizziness and unsteady gait Restrictions Weight Bearing Restrictions: No      Mobility Bed Mobility Overal bed mobility: Needs Assistance Bed Mobility: Supine to Sit     Supine to sit: Min assist     General bed mobility comments: hob flat. pt needed extended time and effort to problem solve exiting the bed. Pt required more than 2 minutes to complete task and multiple attempts with LOB   Transfers Overall transfer level: Needs assistance Equipment used: 1 person hand held assist Transfers: Sit to/from Stand Sit to Stand: Min assist         General transfer comment: reaching for environmental supports. pt with L lateral lean immediately    Balance Overall balance assessment: Needs assistance Sitting-balance support: No upper extremity supported;Feet  supported Sitting balance-Leahy Scale: Zero Sitting balance - Comments: immediate LOB posterior lateral Postural control: Posterior lean;Left lateral lean Standing balance support: Bilateral upper extremity supported;During functional activity Standing balance-Leahy Scale: Poor                              ADL Overall ADL's : Needs assistance/impaired     Grooming: Oral care;Moderate assistance;Standing Grooming Details (indicate cue type and reason): requires containers opened and tactile input for static standing. pt with L lateral lean. Pt requires use of single UE support throughout session                 Toilet Transfer: Minimal assistance           Functional mobility during ADLs: Moderate assistance General ADL Comments: Bed mobility difficult see below. pt unable to sustain static sitting EOB. pt posterior L lean and LOB with don of sock. pt with strong L lean with ambulation reaching for environmental supports. Pt could benefit from RW for stability. pt complete oral care sink level     Vision Eye Alignment: Impaired (comment) Alignment/Gaze Preference: Other (comment) (L eye exotropia) Ocular Range of Motion: Restricted on the left;Restricted on the right;Impaired-to be further tested in functional context Tracking/Visual Pursuits: Left eye does not track laterally;Right eye does not track laterally;Decreased smoothness of horizontal tracking;Decreased smoothness of vertical tracking;Unable to hold eye position out of midline;Impaired - to be further tested in functional context   Convergence: Impaired (comment) Diplopia Assessment: Present in far gaze;Present in near gaze;Only with left gaze;Objects split  on top of one another Depth Perception: Overshoots Additional Comments: Pt demonstrates: L eye exotropia, L eye rotational nystagmus with Lhorizontal eye tracking, L eye drift with cover uncover test, R eye not tracking medial or lateral during  assessment, pt blinks to threat in both eyes, R eye with beating nystagmus with superior vision assessment L eye occluded. Pt demonstrates CN IV and VI deficits . question eye patch for L eye to help with balance and decr diplopia. Ot to further assess prior to providing patch.    Perception     Praxis      Pertinent Vitals/Pain Pain Assessment: No/denies pain     Hand Dominance Right   Extremity/Trunk Assessment Upper Extremity Assessment Upper Extremity Assessment: LUE deficits/detail;Generalized weakness LUE Deficits / Details: decr fine motor, ataxic movement   Lower Extremity Assessment Lower Extremity Assessment: Defer to PT evaluation   Cervical / Trunk Assessment Cervical / Trunk Assessment: Normal   Communication Communication Communication: No difficulties   Cognition Arousal/Alertness: Awake/alert Behavior During Therapy: Flat affect Overall Cognitive Status: Within Functional Limits for tasks assessed                     General Comments       Exercises       Shoulder Instructions      Home Living Family/patient expects to be discharged to:: Private residence Living Arrangements: Other relatives Available Help at Discharge: Family;Available 24 hours/day Type of Home: House Home Access: Stairs to enter Entergy CorporationEntrance Stairs-Number of Steps: 5 Entrance Stairs-Rails: Right;Left Home Layout: One level     Bathroom Shower/Tub: Chief Strategy OfficerTub/shower unit   Bathroom Toilet: Standard     Home Equipment: None          Prior Functioning/Environment Level of Independence: Independent        Comments: Independent with ambulation, ADL's.  Sister provides meals and transportation.    OT Diagnosis: Generalized weakness;Disturbance of vision;Ataxia   OT Problem List: Decreased strength;Decreased activity tolerance;Impaired balance (sitting and/or standing);Decreased safety awareness;Decreased knowledge of use of DME or AE;Decreased knowledge of  precautions;Impaired vision/perception   OT Treatment/Interventions: Self-care/ADL training;Therapeutic exercise;Neuromuscular education;DME and/or AE instruction;Therapeutic activities;Visual/perceptual remediation/compensation;Patient/family education;Balance training    OT Goals(Current goals can be found in the care plan section) Acute Rehab OT Goals Patient Stated Goal: none specifically stated OT Goal Formulation: With patient Time For Goal Achievement: 07/09/14 Potential to Achieve Goals: Good  OT Frequency: Min 2X/week   Barriers to D/C:            Co-evaluation              End of Session Equipment Utilized During Treatment: Gait belt Nurse Communication: Mobility status;Precautions  Activity Tolerance: Patient tolerated treatment well Patient left: in chair;with call bell/phone within reach;with chair alarm set   Time: [phone] OT Time Calculation (min): 21 min Charges:  OT General Charges $OT Visit: 1 Procedure OT Evaluation $Initial OT Evaluation Tier I: 1 Procedure OT Treatments $Self Care/Home Management : 8-22 mins G-Codes:    Boone MasterJones, Amous Crewe B 06/25/2014, 10:44 AM  Pager: [phone]

## 2014-06-25 NOTE — Progress Notes (Signed)
Subjective: No events overnight. Stable. Bp elevated. No new deficits.  Objective: Vital signs in last 24 hours: Filed Vitals:   06/24/14 2138 06/25/14 0229 06/25/14 0652 06/25/14 0914  BP: 173/93 152/76 161/78 158/77  Pulse: 62 70 64 68  Temp: 99 F (37.2 C) 99.3 F (37.4 C) 98.6 F (37 C) 99.1 F (37.3 C)  TempSrc: Oral Oral Oral Oral  Resp: 20 20 20 19   Height:      Weight:      SpO2: 100% 99% 94% 96%   Weight change:   Intake/Output Summary (Last 24 hours) at 06/25/14 1146 Last data filed at 06/25/14 0656  Gross per 24 hour  Intake    240 ml  Output    950 ml  Net   -710 ml   Physical Exam-  General appearance: lying in bed comfortably. Head: AT, [place],  Lungs: clear to auscultation bilaterally, no wheeze or crackle Heart: No added sounds Abdomen: Non tender, bowel sounds present Extremities: No edema Pulses: Warm and well perfused. 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 Strenght 5/5 all extremities.   Basic Metabolic Panel:  Recent Labs Lab 06/23/14 0025 06/23/14 0036 06/23/14 0651  NA 140 142 141  K 4.1 3.9 4.3  CL 102 105 104  CO2 26  --  25  GLUCOSE 100* 102* 106*  BUN 12 11 10   CREATININE 1.12 1.20 0.99  CALCIUM 9.5  --  9.1   Liver Function Tests:  Recent Labs Lab 06/23/14 0025  AST 23  ALT 15  ALKPHOS 54  BILITOT 0.3  PROT 7.3  ALBUMIN 3.5   CBC:  Recent Labs Lab 06/23/14 0025 06/23/14 0036 06/23/14 0651  WBC 7.1  --  7.9  NEUTROABS 3.6  --   --   HGB 12.7* 14.6 12.9*  HCT 39.1 43.0 39.6  MCV 86.3  --  85.7  PLT 188  --  165   CBG:  Recent Labs Lab 06/24/14 0736 06/24/14 1142 06/24/14 1734 06/24/14 2218 06/25/14 0648 06/25/14 1141  GLUCAP 92 107* 113* 171* 131* 151*   Fasting Lipid Panel:  Recent Labs Lab 06/23/14 0645  CHOL 118  HDL 31*  LDLCALC 78  TRIG 44  CHOLHDL 3.8   Coagulation:  Recent Labs Lab 06/23/14 0034    LABPROT 13.4  INR 1.01   Urine Drug Screen: Drugs of Abuse     Component Value Date/Time   LABOPIA NONE DETECTED 06/23/2014 0220   COCAINSCRNUR NONE DETECTED 06/23/2014 0220   LABBENZ NONE DETECTED 06/23/2014 0220   AMPHETMU NONE DETECTED 06/23/2014 0220   THCU POSITIVE* 06/23/2014 0220   LABBARB NONE DETECTED 06/23/2014 0220    Alcohol Level:  Recent Labs Lab 06/23/14 0034  ETH <11   Urinalysis:  Recent Labs Lab 06/23/14 0220  COLORURINE YELLOW  LABSPEC 1.014  PHURINE 6.5  GLUCOSEU NEGATIVE  HGBUR NEGATIVE  BILIRUBINUR NEGATIVE  KETONESUR NEGATIVE  PROTEINUR NEGATIVE  UROBILINOGEN 0.2  NITRITE NEGATIVE  LEUKOCYTESUR NEGATIVE   Studies/Results: No results found. Scheduled Meds: . aspirin  325 mg Oral Daily  . atorvastatin  40 mg Oral q1800  . heparin  5,000 Units Subcutaneous 3 times per day  . 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 Multiple risk factors for stroke including DM2, HLD, HTN, smoking as well as race, sex and family history. Passed bedside swallow eval. Lipid panel- LDL- 78, total- 118. Last hgbA1c- 6.6. Carotid dopplers- left- 40-59%. Despite neg MRI still possible had a stroke, per neurology. -Neurology consulted recs appreciated. -Continue ASA 325mg  daily -Continue home Zocor 40mg  as Lipitor 40mg  daily - [facility] - PT recs- CIR, eval by CIR, to be admitted once bed is available. Discharge once bed is available.  DM2: On metformin 1500mg  QAM and 500mg  QPM at home. -SSI-s- has required 1-2 units of insulin, with some meals. - Consider low dose glipizide i needed as outpt.  HTN- Blood pressure elevated since admission. Initially held to allow for permissive HTN.  - Start lisinopril- 10mg   daily, as pt has diabetes, up titrate on follow up  Schizophrenia: on haldol and Invega. Could not confirm pts medication dose. All Pharmacies listed for pt, never filled this med. Will not restart. Follow up as outpt.  FEN:  - Carb mod.  DVT ppx: subQ hep  Dispo: Likely discharge today pending Neurology recs.   The patient does have a current PCP Gara Kroner(Diana Truong, MD) and [facility] after discharge.  The patient does have transportation limitations that hinder transportation to clinic appointments.  .Services Needed at time of discharge: Y = Yes, Blank = No PT:   OT:   RN:   Equipment:   Other:     LOS: 2 days   Onnie BoerEjiroghene E Antiono Ettinger, MD 06/25/2014, 11:46 AM

## 2014-06-25 NOTE — Progress Notes (Signed)
Subjective:  Patient reports no new symptoms overnight, he had one episode of transient blurry vision which quickly resolved on its own. He denies headache, new numbness or weakness, or any new neurologic symptoms.  Objective: Vital signs in last 24 hours: Filed Vitals:   06/24/14 1755 06/24/14 2138 06/25/14 0229 06/25/14 0652  BP: 138/82 173/93 152/76 161/78  Pulse: 64 62 70 64  Temp: 98.9 F (37.2 C) 99 F (37.2 C) 99.3 F (37.4 C) 98.6 F (37 C)  TempSrc: Oral Oral Oral Oral  Resp: 20 20 20 20   Height:      Weight:      SpO2: 100% 100% 99% 94%   Weight change:   Intake/Output Summary (Last 24 hours) at 06/25/14 0800 Last data filed at 06/25/14 0656  Gross per 24 hour  Intake    240 ml  Output   1400 ml  Net  -1160 ml   General appearance: alert, cooperative and no distress Head: Normocephalic, without obvious abnormality, atraumatic Eyes: positive findings: arcus senilis bilaterally Neck: no carotid bruit, no JVD and supple, symmetrical, trachea midline Lungs: some coarse breath sounds bilaterally in the lower lobes, otherwise clear to auscultation Heart: regular rate and rhythm, S1, S2 normal, no murmur, click, rub or gallop Abdomen: soft, non-tender; bowel sounds normal; no masses,  no organomegaly Extremities: extremities normal, atraumatic, no cyanosis or edema Neurologic: A&O x 3, Mild dysarthria, strength 4+/5 in in RUE and RLE, 4+/5 in LUE and LLE. DTR diminished (1+) in right patellar, otherwise nml. Finger-nose test slow. Impaired R eye ADDuction with concomitant L eye nystagmus. Minor left facial droop. Otherwise cranial nerves intact. Patient has a tendency to lean backwards while sitting on the bed, drifting further over time.  Lab Results: I have reviewed all the lab results.  Micro Results: No results found for this or any previous visit (from the past 240 hour(s)). Studies/Results: Mr Shirlee Latch Wo Contrast  06/23/2014   CLINICAL DATA:  Double vision  in the left eye for the last week. Stroke like symptoms. Acute loss of balance beginning at midnight last night. Acute onset of left-sided weakness.  EXAM: MRI HEAD WITHOUT CONTRAST  MRA HEAD WITHOUT CONTRAST  TECHNIQUE: Multiplanar, multiecho pulse sequences of the brain and surrounding structures were obtained without intravenous contrast. Angiographic images of the head were obtained using MRA technique without contrast.  COMPARISON:  CT head from the same day.  MRI brain 11/06/2009  FINDINGS: MRI HEAD FINDINGS  Remote right cerebellar infarcts are again noted. The diffusion-weighted images demonstrate no evidence for acute or subacute infarct.  No acute hemorrhage or mass lesion is present. There are remote blood products associated with the inferior right cerebellar infarct.  Remote lacunar infarcts are present within the basal ganglia and thalami bilaterally. There are remote lacunar infarcts in the brainstem.  Flow is present in the major intracranial arteries. The globes and orbits are intact. Mild mucosal thickening is present in the left maxillary sinus and anterior ethmoid air cells bilaterally.  MRA HEAD FINDINGS  The study is moderately degraded by patient motion, limiting evaluation of small vessels. The most significant motion is in the lower slab, which affects the basilar and internal carotid arteries.  Signal loss at the slab overlap is likely artifactual. Atherosclerotic changes are present within the cavernous internal carotid arteries bilaterally without a definite stenosis.  The A1 and M1 segments are intact. The anterior communicating artery is not discretely visualized. The MCA bifurcations are intact. There is asymmetric  attenuation of left-sided MCA branch vessels compared to the right.  The left vertebral artery is the dominant vessel. Signal loss in the proximal basilar artery is artifactual. The distal basilar artery is intact. There is moderate attenuation of distal PCA branch vessels.   IMPRESSION: 1. No acute infarct. 2. Remote right cerebellar infarcts with some remote blood products associated. 3. Remote lacunar infarcts of the basal ganglia and brainstem. 4. Atrophy and diffuse white matter disease reflects the sequela of chronic microvascular ischemia. 5. The MRA circle of Willis is moderately degraded by patient motion. 6. Atherosclerotic changes in the cavernous internal carotid arteries bilaterally without definite significant stenosis. 7. Medium and small vessel attenuation diffusely is most prominent in the left MCA branch vessels. This may reflect stenosis within the intracranial ICA.   Electronically Signed   By: Gennette Pachris  Mattern M.D.   On: 06/23/2014 12:12   [name] Contrast  06/23/2014   CLINICAL DATA:  Double vision in the left eye for the last week. Stroke like symptoms. Acute loss of balance beginning at midnight last night. Acute onset of left-sided weakness.  EXAM: MRI HEAD WITHOUT CONTRAST  MRA HEAD WITHOUT CONTRAST  TECHNIQUE: Multiplanar, multiecho pulse sequences of the brain and surrounding structures were obtained without intravenous contrast. Angiographic images of the head were obtained using MRA technique without contrast.  COMPARISON:  CT head from the same day.  MRI brain 11/06/2009  FINDINGS: MRI HEAD FINDINGS  Remote right cerebellar infarcts are again noted. The diffusion-weighted images demonstrate no evidence for acute or subacute infarct.  No acute hemorrhage or mass lesion is present. There are remote blood products associated with the inferior right cerebellar infarct.  Remote lacunar infarcts are present within the basal ganglia and thalami bilaterally. There are remote lacunar infarcts in the brainstem.  Flow is present in the major intracranial arteries. The globes and orbits are intact. Mild mucosal thickening is present in the left maxillary sinus and anterior ethmoid air cells bilaterally.  MRA HEAD FINDINGS  The study is moderately degraded by  patient motion, limiting evaluation of small vessels. The most significant motion is in the lower slab, which affects the basilar and internal carotid arteries.  Signal loss at the slab overlap is likely artifactual. Atherosclerotic changes are present within the cavernous internal carotid arteries bilaterally without a definite stenosis.  The A1 and M1 segments are intact. The anterior communicating artery is not discretely visualized. The MCA bifurcations are intact. There is asymmetric attenuation of left-sided MCA branch vessels compared to the right.  The left vertebral artery is the dominant vessel. Signal loss in the proximal basilar artery is artifactual. The distal basilar artery is intact. There is moderate attenuation of distal PCA branch vessels.  IMPRESSION: 1. No acute infarct. 2. Remote right cerebellar infarcts with some remote blood products associated. 3. Remote lacunar infarcts of the basal ganglia and brainstem. 4. Atrophy and diffuse white matter disease reflects the sequela of chronic microvascular ischemia. 5. The MRA circle of Willis is moderately degraded by patient motion. 6. Atherosclerotic changes in the cavernous internal carotid arteries bilaterally without definite significant stenosis. 7. Medium and small vessel attenuation diffusely is most prominent in the left MCA branch vessels. This may reflect stenosis within the intracranial ICA.   Electronically Signed   By: Gennette Pachris  Mattern M.D.   On: 06/23/2014 12:12   Medications: I have reviewed the patient's current medications. Scheduled Meds: . aspirin  325 mg Oral Daily  . atorvastatin  40  mg Oral q1800  . heparin  5,000 Units Subcutaneous 3 times per day  . 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 Multiple risk factors for stroke including DM2, HLD, HTN, smoking as well as race, sex and family history. Passed bedside swallow eval. Lipid panel- LDL- 78, total- 118. Last hgbA1c- 6.6. - Neurology believes this is a new stroke - [facility] will take the patient - Add Plavix per neurology - Start Lisinopril 10mg  daily given persistent HTN >48hrs - Continue ASA 325mg  daily for 3 mo, then plavix alone - Continue home Zocor 40mg  as Lipitor 40mg  daily - [facility]  DM2: On metformin 1500mg  QAM and 500mg  QPM at home. -SSI, Accuchecks Q4hr  Schizophrenia: on haldol and Invega, holding for now. Attempted to track down accurate dosages but multiple pharmacies had no records ([facility], [facility], and [facility])  FEN:  - Carb mod  DVT ppx: subQ hep  Dispo:  D/c and readmit to [facility]  This is a Psychologist, occupationalMedical Student Note.  The care of the patient was discussed with Dr. Pete PeltEmokpai and the assessment and plan formulated with their assistance.  Please see their attached note for official documentation of the daily encounter.   LOS: 2 days   Tamera PuntBenjamin J Chelcea Zahn, Med Student 06/25/2014, 8:00 AM

## 2014-06-26 ENCOUNTER — Encounter (HOSPITAL_COMMUNITY): Payer: Self-pay | Admitting: *Deleted

## 2014-06-26 ENCOUNTER — Inpatient Hospital Stay (HOSPITAL_COMMUNITY)
Admission: RE | Admit: 2014-06-26 | Discharge: 2014-07-05 | DRG: 057 | Disposition: A | Discharge status: Home Health | Payer: Medicare Other | Source: Intra-hospital | Attending: Physical Medicine & Rehabilitation | Admitting: Physical Medicine & Rehabilitation

## 2014-06-26 DIAGNOSIS — I69354 Hemiplegia and hemiparesis following cerebral infarction affecting left non-dominant side: Principal | ICD-10-CM

## 2014-06-26 DIAGNOSIS — Z658 Other specified problems related to psychosocial circumstances: Secondary | ICD-10-CM

## 2014-06-26 DIAGNOSIS — E785 Hyperlipidemia, unspecified: Secondary | ICD-10-CM | POA: Diagnosis present

## 2014-06-26 DIAGNOSIS — H5120 Internuclear ophthalmoplegia, unspecified eye: Secondary | ICD-10-CM | POA: Diagnosis present

## 2014-06-26 DIAGNOSIS — K5900 Constipation, unspecified: Secondary | ICD-10-CM | POA: Diagnosis present

## 2014-06-26 DIAGNOSIS — F419 Anxiety disorder, unspecified: Secondary | ICD-10-CM | POA: Diagnosis present

## 2014-06-26 DIAGNOSIS — H5500 Unspecified nystagmus: Secondary | ICD-10-CM | POA: Diagnosis present

## 2014-06-26 DIAGNOSIS — I638 Other cerebral infarction: Secondary | ICD-10-CM

## 2014-06-26 DIAGNOSIS — F209 Schizophrenia, unspecified: Secondary | ICD-10-CM | POA: Diagnosis present

## 2014-06-26 DIAGNOSIS — E1142 Type 2 diabetes mellitus with diabetic polyneuropathy: Secondary | ICD-10-CM | POA: Diagnosis present

## 2014-06-26 DIAGNOSIS — F17210 Nicotine dependence, cigarettes, uncomplicated: Secondary | ICD-10-CM | POA: Diagnosis present

## 2014-06-26 DIAGNOSIS — R449 Unspecified symptoms and signs involving general sensations and perceptions: Secondary | ICD-10-CM

## 2014-06-26 DIAGNOSIS — I639 Cerebral infarction, unspecified: Secondary | ICD-10-CM | POA: Diagnosis present

## 2014-06-26 DIAGNOSIS — I69993 Ataxia following unspecified cerebrovascular disease: Secondary | ICD-10-CM

## 2014-06-26 DIAGNOSIS — F1290 Cannabis use, unspecified, uncomplicated: Secondary | ICD-10-CM | POA: Diagnosis present

## 2014-06-26 DIAGNOSIS — R4189 Other symptoms and signs involving cognitive functions and awareness: Secondary | ICD-10-CM

## 2014-06-26 DIAGNOSIS — I10 Essential (primary) hypertension: Secondary | ICD-10-CM | POA: Diagnosis present

## 2014-06-26 DIAGNOSIS — H5121 Internuclear ophthalmoplegia, right eye: Secondary | ICD-10-CM

## 2014-06-26 HISTORY — DX: Cerebral infarction, unspecified: I63.9

## 2014-06-26 HISTORY — DX: Unspecified symptoms and signs involving general sensations and perceptions: R44.9

## 2014-06-26 HISTORY — DX: Internuclear ophthalmoplegia, unspecified eye: H51.20

## 2014-06-26 LAB — CBC
HEMATOCRIT: 40.8 % (ref 39.0–52.0)
Hemoglobin: 12.9 g/dL — ABNORMAL LOW (ref 13.0–17.0)
MCH: 27.9 pg (ref 26.0–34.0)
MCHC: 31.6 g/dL (ref 30.0–36.0)
MCV: 88.3 fL (ref 78.0–100.0)
Platelets: 174 10*3/uL (ref 150–400)
RBC: 4.62 MIL/uL (ref 4.22–5.81)
RDW: 14.5 % (ref 11.5–15.5)
WBC: 10 10*3/uL (ref 4.0–10.5)

## 2014-06-26 LAB — GLUCOSE, CAPILLARY
GLUCOSE-CAPILLARY: 182 mg/dL — AB (ref 70–99)
Glucose-Capillary: 123 mg/dL — ABNORMAL HIGH (ref 70–99)
Glucose-Capillary: 132 mg/dL — ABNORMAL HIGH (ref 70–99)
Glucose-Capillary: 115 mg/dL — ABNORMAL HIGH (ref 70–99)

## 2014-06-26 LAB — CREATININE, SERUM
Creatinine, Ser: 1.26 mg/dL (ref 0.50–1.35)
GFR calc Af Amer: 72 mL/min — ABNORMAL LOW (ref >90)
GFR calc non Af Amer: 62 mL/min — ABNORMAL LOW (ref >90)

## 2014-06-26 MED ORDER — SENNOSIDES-DOCUSATE SODIUM 8.6-50 MG PO TABS
1.0000 | ORAL_TABLET | Freq: Every evening | ORAL | Status: DC | PRN
  Administered 2014-06-28: 1 via ORAL
  Filled 2014-06-26: qty 1

## 2014-06-26 MED ORDER — INSULIN ASPART 100 UNIT/ML ~~LOC~~ SOLN
0.0000 [IU] | Freq: Three times a day (TID) | SUBCUTANEOUS | Status: DC
  Administered 2014-06-27 – 2014-07-04 (×6): 1 [IU] via SUBCUTANEOUS
  Administered 2014-07-05: 2 [IU] via SUBCUTANEOUS

## 2014-06-26 MED ORDER — TRAZODONE HCL 50 MG PO TABS
50.0000 mg | ORAL_TABLET | Freq: Every evening | ORAL | Status: DC | PRN
  Administered 2014-06-26: 50 mg via ORAL
  Filled 2014-06-26: qty 1

## 2014-06-26 MED ORDER — ATORVASTATIN CALCIUM 40 MG PO TABS
40.0000 mg | ORAL_TABLET | Freq: Every day | ORAL | Status: DC
  Administered 2014-06-26 – 2014-07-04 (×9): 40 mg via ORAL
  Filled 2014-06-26 (×10): qty 1

## 2014-06-26 MED ORDER — SORBITOL 70 % SOLN
30.0000 mL | Freq: Every day | Status: DC | PRN
  Administered 2014-06-28: 30 mL via ORAL
  Filled 2014-06-26: qty 30

## 2014-06-26 MED ORDER — ONDANSETRON HCL 4 MG PO TABS: 4.0000 mg | ORAL_TABLET | Freq: Four times a day (QID) | ORAL | Status: DC | PRN

## 2014-06-26 MED ORDER — ACETAMINOPHEN 325 MG PO TABS
325.0000 mg | ORAL_TABLET | ORAL | Status: DC | PRN
  Administered 2014-06-26: 650 mg via ORAL
  Filled 2014-06-26: qty 2

## 2014-06-26 MED ORDER — ASPIRIN EC 325 MG PO TBEC
325.0000 mg | DELAYED_RELEASE_TABLET | Freq: Every day | ORAL | Status: DC
  Administered 2014-06-27 – 2014-07-04 (×8): 325 mg via ORAL
  Filled 2014-06-26 (×10): qty 1

## 2014-06-26 MED ORDER — ATORVASTATIN CALCIUM 40 MG PO TABS: 40.0000 mg | ORAL_TABLET | Freq: Every day | ORAL | Status: DC

## 2014-06-26 MED ORDER — ONDANSETRON HCL 4 MG/2ML IJ SOLN: 4.0000 mg | Freq: Four times a day (QID) | INTRAMUSCULAR | Status: DC | PRN

## 2014-06-26 MED ORDER — HEPARIN SODIUM (PORCINE) 5000 UNIT/ML IJ SOLN
5000.0000 [IU] | Freq: Three times a day (TID) | INTRAMUSCULAR | Status: DC
  Administered 2014-06-26 – 2014-07-05 (×27): 5000 [IU] via SUBCUTANEOUS
  Filled 2014-06-26 (×31): qty 1

## 2014-06-26 MED ORDER — LISINOPRIL 10 MG PO TABS: 10.0000 mg | ORAL_TABLET | Freq: Every day | ORAL | Status: DC

## 2014-06-26 MED ORDER — CLOPIDOGREL BISULFATE 75 MG PO TABS: 75.0000 mg | ORAL_TABLET | Freq: Every day | ORAL | Status: DC

## 2014-06-26 MED ORDER — LISINOPRIL 10 MG PO TABS
10.0000 mg | ORAL_TABLET | Freq: Every day | ORAL | Status: DC
  Administered 2014-06-27 – 2014-07-05 (×9): 10 mg via ORAL
  Filled 2014-06-26 (×10): qty 1

## 2014-06-26 MED ORDER — HEPARIN SODIUM (PORCINE) 5000 UNIT/ML IJ SOLN: 5000.0000 [IU] | Freq: Three times a day (TID) | INTRAMUSCULAR | Status: DC

## 2014-06-26 MED ORDER — ASPIRIN EC 81 MG PO TBEC: 81.0000 mg | DELAYED_RELEASE_TABLET | Freq: Every day | ORAL | Status: DC

## 2014-06-26 NOTE — Discharge Instructions (Signed)
Ischemic Stroke °A stroke (cerebrovascular accident) is the sudden death of brain tissue. It is a medical emergency. A stroke can cause permanent loss of brain function. This can cause problems with different parts of your body. A transient ischemic attack (TIA) is different because it does not cause permanent damage. A TIA is a short-lived problem of poor blood flow affecting a part of the brain. A TIA is also a serious problem because having a TIA greatly increases the chances of having a stroke. When symptoms first develop, you cannot know if the problem might be a stroke or a TIA. °CAUSES  °A stroke is caused by a decrease of oxygen supply to an area of your brain. It is usually the result of a small blood clot or collection of cholesterol or fat (plaque) that blocks blood flow in the brain. A stroke can also be caused by blocked or damaged carotid arteries.  °RISK FACTORS °· High blood pressure (hypertension). °· High cholesterol. °· Diabetes mellitus. °· Heart disease. °· The buildup of plaque in the blood vessels (peripheral artery disease or atherosclerosis). °· The buildup of plaque in the blood vessels providing blood and oxygen to the brain (carotid artery stenosis). °· An abnormal heart rhythm (atrial fibrillation). °· Obesity. °· Smoking. °· Taking oral contraceptives (especially in combination with smoking). °· Physical inactivity. °· A diet high in fats, salt (sodium), and calories. °· Alcohol use. °· Use of illegal drugs (especially cocaine and methamphetamine). °· Being African American. °· Being over the age of 55. °· Family history of stroke. °· Previous history of blood clots, stroke, TIA, or heart attack. °· Sickle cell disease. °SYMPTOMS  °These symptoms usually develop suddenly, or may be newly present upon awakening from sleep: °· Sudden weakness or numbness of the face, arm, or leg, especially on one side of the body. °· Sudden trouble walking or difficulty moving arms or legs. °· Sudden  confusion. °· Sudden personality changes. °· Trouble speaking (aphasia) or understanding. °· Difficulty swallowing. °· Sudden trouble seeing in one or both eyes. °· Double vision. °· Dizziness. °· Loss of balance or coordination. °· Sudden severe headache with no known cause. °· Trouble reading or writing. °DIAGNOSIS  °Your health care provider can often determine the presence or absence of a stroke based on your symptoms, history, and physical exam. Computed tomography (CT) of the brain is usually performed to confirm the stroke, determine causes, and determine stroke severity. Other tests may be done to find the cause of the stroke. These tests may include: °· Electrocardiography. °· Continuous heart monitoring. °· Echocardiography. °· Carotid ultrasonography. °· Magnetic resonance imaging (MRI). °· A scan of the brain circulation. °· Blood tests. °PREVENTION  °The risk of a stroke can be decreased by appropriately treating high blood pressure, high cholesterol, diabetes, heart disease, and obesity and by quitting smoking, limiting alcohol, and staying physically active. °TREATMENT  °Time is of the essence. It is important to seek treatment at the first sign of these symptoms because you may receive a medicine to dissolve the clot (thrombolytic) that cannot be given if too much time has passed since your symptoms began. Even if you do not know when your symptoms began, get treatment as soon as possible as there are other treatment options available including oxygen, intravenous (IV) fluids, and medicines to thin the blood (anticoagulants). Treatment of stroke depends on the duration, severity, and cause of your symptoms. Medicines and dietary changes may be used to address diabetes, high blood   pressure, and other risk factors. Physical, speech, and occupational therapists will assess you and work with you to improve any functions impaired by the stroke. Measures will be taken to prevent short-term and long-term  complications, including infection from breathing foreign material into the lungs (aspiration pneumonia), blood clots in the legs, bedsores, and falls. Rarely, surgery may be needed to remove large blood clots or to open up blocked arteries. °HOME CARE INSTRUCTIONS  °· Take medicines only as directed by your health care provider. Follow the directions carefully. Medicines may be used to control risk factors for a stroke. Be sure you understand all your medicine instructions. °· You may be told to take a medicine to thin the blood, such as aspirin or the anticoagulant warfarin. Warfarin needs to be taken exactly as instructed. °¨ Too much and too little warfarin are both dangerous. Too much warfarin increases the risk of bleeding. Too little warfarin continues to allow the risk for blood clots. While taking warfarin, you will need to have regular blood tests to measure your blood clotting time. These blood tests usually include both the PT and INR tests. The PT and INR results allow your health care provider to adjust your dose of warfarin. The dose can change for many reasons. It is critically important that you take warfarin exactly as prescribed, and that you have your PT and INR levels drawn exactly as directed. °¨ Many foods, especially foods high in vitamin K, can interfere with warfarin and affect the PT and INR results. Foods high in vitamin K include spinach, kale, broccoli, cabbage, collard and turnip greens, brussels sprouts, peas, cauliflower, seaweed, and parsley, as well as beef and pork liver, green tea, and soybean oil. You should eat a consistent amount of foods high in vitamin K. Avoid major changes in your diet, or notify your health care provider before changing your diet. Arrange a visit with a dietitian to answer your questions. °¨ Many medicines can interfere with warfarin and affect the PT and INR results. You must tell your health care provider about any and all medicines you take. This  includes all vitamins and supplements. Be especially cautious with aspirin and anti-inflammatory medicines. Do not take or discontinue any prescribed or over-the-counter medicine except on the advice of your health care provider or pharmacist. °¨ Warfarin can have side effects, such as excessive bruising or bleeding. You will need to hold pressure over cuts for longer than usual. Your health care provider or pharmacist will discuss other potential side effects. °¨ Avoid sports or activities that may cause injury or bleeding. °¨ Be mindful when shaving, flossing your teeth, or handling sharp objects. °¨ Alcohol can change the body's ability to handle warfarin. It is best to avoid alcoholic drinks or consume only very small amounts while taking warfarin. Notify your health care provider if you change your alcohol intake. °¨ Notify your dentist or other health care providers before procedures. °· If swallow studies have determined that your swallowing reflex is present, you should eat healthy foods. Including 5 or more servings of fruits and vegetables a day may reduce the risk of stroke. Foods may need to be a certain consistency (soft or pureed), or small bites may need to be taken in order to avoid aspirating or choking. Certain dietary changes may be advised to address high blood pressure, high cholesterol, diabetes, or obesity. °¨ Food choices that are low in sodium, saturated fat, trans fat, and cholesterol are recommended to manage high blood pressure. °¨   Food choies that are high in fiber, and low in saturated fat, trans fat, and cholesterol may control cholesterol levels. °¨ Controlling carbohydrates and sugar intake is recommended to manage diabetes. °¨ Reducing calorie intake and making food choices that are low in sodium, saturated fat, trans fat, and cholesterol are recommended to manage obesity. °· Maintain a healthy weight. °· Stay physically active. It is recommended that you get at least 30 minutes of  activity on all or most days. °· Do not use any tobacco products including cigarettes, chewing tobacco, or electronic cigarettes. °· Limit alcohol use even if you are not taking warfarin. Moderate alcohol use is considered to be: °¨ No more than 2 drinks each day for men. °¨ No more than 1 drink each day for nonpregnant women. °· Home safety. A safe home environment is important to reduce the risk of falls. Your health care provider may arrange for specialists to evaluate your home. Having grab bars in the bedroom and bathroom is often important. Your health care provider may arrange for equipment to be used at home, such as raised toilets and a seat for the shower. °· Physical, occupational, and speech therapy. Ongoing therapy may be needed to maximize your recovery after a stroke. If you have been advised to use a walker or a cane, use it at all times. Be sure to keep your therapy appointments. °· Follow all instructions for follow-up with your health care provider. This is very important. This includes any referrals, physical therapy, rehabilitation, and lab tests. Proper follow-up can prevent another stroke from occurring. °SEEK MEDICAL CARE IF: °· You have personality changes. °· You have difficulty swallowing. °· You are seeing double. °· You have dizziness. °· You have a fever. °· You have skin breakdown. °SEEK IMMEDIATE MEDICAL CARE IF:  °Any of these symptoms may represent a serious problem that is an emergency. Do not wait to see if the symptoms will go away. Get medical help right away. Call your local emergency services (911 in [place] Do not drive yourself to the hospital. °· You have sudden weakness or numbness of the face, arm, or leg, especially on one side of the body. °· You have sudden trouble walking or difficulty moving arms or legs. °· You have sudden confusion. °· You have trouble speaking (aphasia) or understanding. °· You have sudden trouble seeing in one or both eyes. °· You have a loss of  balance or coordination. °· You have a sudden, severe headache with no known cause. °· You have new chest pain or an irregular heartbeat. °· You have a partial or total loss of consciousness. °Document Released: 08/02/2005 Document Revised: 12/17/2013 Document Reviewed: 03/12/2012 °[employer]® Patient Information ©2015 [employer]. This information is not intended to replace advice given to you by your health care provider. Make sure you discuss any questions you have with your health care provider. ° °

## 2014-06-26 NOTE — Progress Notes (Signed)
Subjective:  Patient reports no new symptoms overnight, he has been out of bed with assistance and states that he feels he is regaining a strength.   Objective: Vital signs in last 24 hours: Filed Vitals:   06/25/14 1759 06/25/14 2152 06/26/14 0200 06/26/14 0600  BP: 136/80 125/61 127/58 132/67  Pulse: 72 59 63 65  Temp: 98.3 F (36.8 C) 98.8 F (37.1 C) 98.6 F (37 C) 98.7 F (37.1 C)  TempSrc: Oral Oral Oral Oral  Resp: 18  18 18   Height:      Weight:      SpO2: 98% 98% 97% 98%   Weight change:   Intake/Output Summary (Last 24 hours) at 06/26/14 0843 Last data filed at 06/26/14 [id number]  Gross per 24 hour  Intake    240 ml  Output    700 ml  Net   -460 ml   General appearance: alert, cooperative and no distress Head: Normocephalic, without obvious abnormality, atraumatic Eyes: positive findings: arcus senilis bilaterally Neck: no carotid bruit, no JVD and supple, symmetrical, trachea midline Lungs: some coarse breath sounds bilaterally in the lower lobes, otherwise clear to auscultation Heart: regular rate and rhythm, S1, S2 normal, no murmur, click, rub or gallop Abdomen: soft, non-tender; bowel sounds normal; no masses,  no organomegaly Extremities: extremities normal, atraumatic, no cyanosis or edema Neurologic: A&O x 3, Mild dysarthria, strength 4+/5 in in RUE and RLE, 4+/5 in LUE and LLE. DTR diminished (1+) in right patellar, otherwise nml. Finger-nose test slow. Impaired R eye ADDuction with concomitant L eye nystagmus. Minor left facial droop. Otherwise cranial nerves intact. Patient has a tendency to lean backwards while sitting on the bed, drifting further over time.  Lab Results: I have reviewed all the lab results.  Micro Results: No results found for this or any previous visit (from the past 240 hour(s)). Studies/Results: No results found. Medications: I have reviewed the patient's current medications. Scheduled Meds: . aspirin  325 mg Oral Daily  .  atorvastatin  40 mg Oral q1800  . heparin  5,000 Units Subcutaneous 3 times per day  . insulin aspart  0-9 Units Subcutaneous TID WC  . 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 Multiple risk factors for stroke including DM2, HLD, HTN, smoking as well as race, sex and family history. Passed bedside swallow eval. Lipid panel- LDL- 78, total- 118. Last hgbA1c- 6.6. - Neurology believes this is a new stroke - [facility] will take the patient, awaiting bed - Add Plavix per neurology - Start Lisinopril 10mg  daily given persistent HTN >48hrs - Continue ASA 325mg  daily for 3 mo, then plavix alone - Continue home Zocor 40mg  as Lipitor 40mg  daily - [facility].  DM2: On metformin 1500mg  QAM and 500mg  QPM at home. -SSI, Accuchecks Q4hr  Schizophrenia: on haldol and Invega, holding for now. Attempted to track down accurate dosages but multiple pharmacies had no records ([facility], [facility], and [facility])  FEN:  - Carb mod  DVT ppx: subQ hep  Dispo:  D/c and readmit to [facility]  This is  a Medical Student Note.  The care of the patient was discussed with Dr. Pete PeltEmokpai and the assessment and plan formulated with their assistance.  Please see their attached note for official documentation of the daily encounter.   LOS: 3 days   Tamera PuntBenjamin J Spencer Cardinal, Med Student 06/26/2014, 8:43 AM

## 2014-06-26 NOTE — Progress Notes (Signed)
Physical Therapy Treatment Patient Details Name: [name] Williams MRN: [id number] DOB: 10/04/1955 Today's Date: 06/26/2014    History of Present Illness 58 yo male admitted with L side weakness , unsteady gait, diplopia- CN deficit with decreased eye movement. MRI remote R cerebellum and brainstem ([facility]). PMH: Cva 2011 with CIR admission R side weakness, HTN, DM, HLD, schixophrenia    PT Comments    Patient continues to have L lateral lean with all aspects of mobility. Max cues and mod A required to work on upright posture and weight shifting to R side. Plan is for CIR later today. He was able to progress and maintain good standing balance this session with less posterior lean  Follow Up Recommendations  CIR;Supervision/Assistance - 24 hour     Equipment Recommendations  Rolling walker with 5" wheels    Recommendations for Other Services       Precautions / Restrictions Precautions Precautions: Fall Precaution Comments: s/p fall at home with dizziness and unsteady gait    Mobility  Bed Mobility               General bed mobility comments: Patient up in recliner before and after session  Transfers Overall transfer level: Needs assistance Equipment used: Rolling walker (2 wheeled)   Sit to Stand: Min assist         General transfer comment: Min A to ensure balance this session. patient with less posterior lean. A to find RW on L side  Ambulation/Gait Ambulation/Gait assistance: Mod assist Ambulation Distance (Feet): 60 Feet Assistive device: Rolling walker (2 wheeled) Gait Pattern/deviations: Step-through pattern;Decreased step length - right;Decreased step length - left;Narrow base of support Gait velocity: Decreased Gait velocity interpretation: Below normal speed for age/gender General Gait Details: Verbal cues for safe use of RW.  Patient required mod assist to maintain balance during gait - leaning to left.   No LOB noted this session but did require mod A for  hip/shoulder facilitation to R side. A for RW management and to avoid obstacles on L side   Stairs            Wheelchair Mobility    Modified Rankin (Stroke Patients Only) Modified Rankin (Stroke Patients Only) Pre-Morbid Rankin Score: No significant disability Modified Rankin: Moderately severe disability     Balance             Standing balance-Leahy Scale: Good Standing balance comment: One in standing patient able to maintain balance without support but unable to makes changes.                     Cognition   Behavior During Therapy: WFL for tasks assessed/performed Overall Cognitive Status: Within Functional Limits for tasks assessed                      Exercises      General Comments        Pertinent Vitals/Pain Pain Assessment: No/denies pain    Home Living                      Prior Function            PT Goals (current goals can now be found in the care plan section) Progress towards PT goals: Progressing toward goals    Frequency  Min 4X/week    PT Plan Current plan remains appropriate    Co-evaluation  End of Session Equipment Utilized During Treatment: Gait belt Activity Tolerance: Patient tolerated treatment well;Patient limited by fatigue Patient left: in chair;with call bell/phone within reach;with family/visitor present;with chair alarm set     Time: [id number] PT Time Calculation (min) (ACUTE ONLY): 19 min  Charges:  $Gait Training: 8-22 mins                    G Codes:      Fredrich BirksRobinette, Yoshino Broccoli Elizabeth 06/26/2014, 9:53 AM 06/26/2014 Fredrich Birksobinette, Su Duma Elizabeth PTA [phone] pager [phone] office

## 2014-06-26 NOTE — Progress Notes (Signed)
[name] Williams, [name] Williams Rehab Admission Coordinator Signed Physical Medicine and Rehabilitation PMR Pre-admission 06/26/2014 10:34 AM  Related encounter: ED to Hosp-Admission (Current) [facility]    Expand All Collapse All   PMR Admission Coordinator Pre-Admission Assessment  Patient: [name] Williams is an 58 y.o., male MRN: [id number] DOB: Jul 30, 1956 Height: 5\' 10"  (177.8 cm) Weight: 74.617 kg (164 lb 8 oz)  Insurance Information HMO: PPO: PCP: no IPA: no 80/20: no OTHER: no PRIMARY: Medicare A and B Policy#: [id number] a Subscriber: self CM Name: Phone#: Fax#:  Pre-Cert#: Employer: disabled Benefits: Phone #: Name:  Eff. Date: 04/16/86 Deduct: $1260 Out of Pocket Max: No Life Max: no CIR: 100% SNF: 100% first 20 days per Medicare Outpatient: 80% Co-Pay: 20% Home Health: 100% Co-Pay:  DME: 80% Co-Pay: 20% Providers: Pt. choice  Emergency Contact Information Contact Information    Name Relation Home Work Mobile   El JebelReaves,[name] [name] Williams   [phone]     Current Medical History  Patient Admitting Diagnosis: new ? 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 Patient lives with his [name] Williams and was independent prior to admission. Admitted 06/23/2014 with increased left-sided  weakness, slurred speech and blurred vision . 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 Neurology follow-up suspect posterior circulation infarcts secondary to small vessel disease.echocardiogram with ejection fraction of 50% grade 1 diastolic dysfunction. Carotid Dopplers with left 40-59% ICA stenosis. Urine drug screen upon admission positive marijuana. Patient did not receive TPA. Neurology services consulted presently maintained on aspirin 325 mg daily for CVA prophylaxis as well as subcutaneous heparin for DVT prophylaxis. Patient is tolerating a regular consistency diet. Physical and occupational therapy evaluations completed 06/24/2014 with recommendations of physical medicine rehabilitation consult.Patient was admitted for comprehensive rehab program. Total: 7 NIH    Past Medical History  Past Medical History  Diagnosis Date  . Diabetes mellitus   . Hypertension   . History of stroke     Family History  family history includes Coronary artery disease in his maternal aunt, maternal uncle, paternal aunt, and paternal uncle; Diabetes in his maternal aunt, maternal uncle, paternal aunt, and paternal uncle; Hypertension in his father and mother.  Prior Rehab/Hospitalizations: March 2001, CVA  Current Medications  Current facility-administered medications: aspirin EC tablet 325 mg, 325 mg, Oral, Daily, Genelle GatherKathryn F Glenn, MD, 325 mg at 06/26/14 [id number]; atorvastatin (LIPITOR) tablet 40 mg, 40 mg, Oral, q1800, Genelle GatherKathryn F Glenn, MD, 40 mg at 06/25/14 1717; heparin injection 5,000 Units, 5,000 Units, Subcutaneous, 3 times per day, Genelle GatherKathryn F Glenn, MD, 5,000 Units at 06/26/14 [id number] insulin aspart (novoLOG) injection 0-9 Units, 0-9 Units, Subcutaneous, TID WC, Ejiroghene E Mariea ClontsEmokpae, MD, 1 Units at 06/26/14 [id number]; lisinopril (PRINIVIL,ZESTRIL) tablet 10 mg, 10 mg, Oral, Daily, Ejiroghene E Emokpae, MD, 10 mg at 06/26/14 [id number];  senna-docusate (Senokot-S) tablet 1 tablet, 1 tablet, Oral, QHS PRN, Genelle GatherKathryn F Glenn, MD  Patients Current Diet: Diet heart healthy/carb modified  Precautions / Restrictions Precautions Precautions: Fall Precaution Comments: s/p fall at home with dizziness and unsteady gait Restrictions Weight Bearing Restrictions: No   Prior Activity Level Limited Community (1-2x/wk): Pt. reports  he walks to the local department store 3-4 times per week; he does not drive. [name] Williams transports him to his appointments Home Assistive Devices / Equipment Home Assistive Devices/Equipment: None Home Equipment: None  Prior Functional Level Prior Function Level of Independence: Independent Comments: Independent with ambulation, ADL's. [name] Williams provides meals and transportation.  Current Functional Level Cognition  Overall Cognitive Status: Within Functional Limits for tasks assessed Orientation Level: Oriented X4   Extremity Assessment (includes Sensation/Coordination)          ADLs  Overall ADL's : Needs assistance/impaired Grooming: Oral care, Moderate assistance, Standing Grooming Details (indicate cue type and reason): requires containers opened and tactile input for static standing. pt with L lateral lean. Pt requires use of single UE support throughout session Toilet Transfer: Minimal assistance Functional mobility during ADLs: Moderate assistance General ADL Comments: Bed mobility difficult see below. pt unable to sustain static sitting EOB. pt posterior L lean and LOB with don of sock. pt with strong L lean with ambulation reaching for environmental supports. Pt could benefit from RW for stability. pt complete oral care sink level    Mobility  Overal bed mobility: Needs Assistance Bed Mobility: Supine to Sit Supine to sit: Min assist Sit to supine: Min guard General bed mobility comments: Patient up in recliner before and after session    Transfers  Overall transfer level:  Needs assistance Equipment used: Rolling walker (2 wheeled) Transfers: Sit to/from Stand Sit to Stand: Min assist General transfer comment: Min A to ensure balance this session. patient with less posterior lean. A to find RW on L side    Ambulation / Gait / Stairs / Wheelchair Mobility  Ambulation/Gait Ambulation/Gait assistance: Mod assist Ambulation Distance (Feet): 60 Feet Assistive device: Rolling walker (2 wheeled) Gait Pattern/deviations: Step-through pattern, Decreased step length - right, Decreased step length - left, Narrow base of support Gait velocity: Decreased Gait velocity interpretation: Below normal speed for age/gender General Gait Details: Verbal cues for safe use of RW. Patient required mod assist to maintain balance during gait - leaning to left. No LOB noted this session but did require mod A for hip/shoulder facilitation to R side. A for RW management and to avoid obstacles on L side    Posture / Balance Dynamic Sitting Balance Sitting balance - Comments: immediate LOB posterior lateral    Special needs/care consideration BiPAP/CPAP no Skin per RN [name], skin intact  Bowel mgmt: Last BM pre pt. 06/23/14; not documented in nursing notes Bladder mgmt: RN Sim Boast reports incontinent of urine; pt. Reports attempting to use urinal Diabetic mgmt A1C 6.4     Previous Home Environment Living Arrangements: Other relatives Lives With: Family ([name] Williams) Available Help at Discharge: Family, Available 24 hours/day Type of Home: House Home Layout: One level Home Access: Stairs to enter Entrance Stairs-Rails: Right, Left Entrance Stairs-Number of Steps: 5 Bathroom Shower/Tub: Engineer, manufacturing systems: Standard Home Care Services: No  Discharge Living Setting Plans for Discharge Living Setting: Patient's home, Lives with (comment) ([name] Williams [name] Williams) Type of Home at Discharge: House Discharge Home Layout: One  level Discharge Home Access: Stairs to enter Entrance Stairs-Rails: Right, Left Entrance Stairs-Number of Steps: 5 Discharge Bathroom Shower/Tub: Tub/shower unit Discharge Bathroom Toilet: Standard Discharge Bathroom Accessibility: Yes How Accessible: Accessible via walker Does the patient have any problems obtaining your medications?: No  Social/Family/Support Systems Patient Roles: Other (Comment) (sibling to 3 sisters) Anticipated Caregiver: Hue Frick Anticipated Caregiver's Contact Information: [phone] Ability/Limitations of Caregiver: Chyrl Civatte works during the day. Chyrl Civatte  is trying to arrange with Clara (another [name] Williams) for her to supervise pt. while [name] works Engineer, structuralCaregiver Availability: Other (Comment) ([name] is working on 24 hour care) Discharge Plan Discussed with Primary Caregiver: Yes Is Caregiver In Agreement with Plan?: Yes Does Caregiver/Family have Issues with Lodging/Transportation while Pt is in Rehab?: No    Goals/Additional Needs Patient/Family Goal for Rehab: supervision for PT and SLP; supervision to min assist for OT Expected length of stay: 7 days Cultural Considerations: none Equipment Needs: TBD Pt/Family Agrees to Admission and willing to participate: Yes Program Orientation Provided & Reviewed with Pt/Caregiver Including Roles & Responsibilities: Yes   Decrease burden of Care through IP rehab admission: no   Possible need for SNF placement upon discharge: Not planned   Patient Condition: This patient's condition remains as documented in the consult dated 06/25/14 , in which the Rehabilitation Physician determined and documented that the patient's condition is appropriate for intensive rehabilitative care in an inpatient rehabilitation facility. Will admit to inpatient rehab today.  Preadmission Screen Completed By: [name] Williams, Oluwatosin Bracy, 06/26/2014 10:34 AM ______________________________________________________________________  Discussed status  with Dr. Riley KillSwartz on 06/26/14 at 1046 and received telephone approval for admission today.  Admission Coordinator: [name] Williams, Emilea Goga, time [id number] Dorna Bloom/Date 06/26/14          Cosigned by: Ranelle OysterZachary T Swartz, MD at 06/26/2014 11:11 AM  Revision History

## 2014-06-26 NOTE — Progress Notes (Signed)
Subjective: No events overnight. Stable. Sitting in recliner at bedside. Discharge to CIR today.   Objective: Vital signs in last 24 hours: Filed Vitals:   06/25/14 1759 06/25/14 2152 06/26/14 0200 06/26/14 0600  BP: 136/80 125/61 127/58 132/67  Pulse: 72 59 63 65  Temp: 98.3 F (36.8 C) 98.8 F (37.1 C) 98.6 F (37 C) 98.7 F (37.1 C)  TempSrc: Oral Oral Oral Oral  Resp: 18  18 18   Height:      Weight:      SpO2: 98% 98% 97% 98%   Weight change:   Intake/Output Summary (Last 24 hours) at 06/26/14 1010 Last data filed at 06/26/14 [id number]  Gross per 24 hour  Intake    240 ml  Output    700 ml  Net   -460 ml   Physical Exam-  General appearance:NAD Head: AT, [place] Lungs: Normal work of breathing Heart: Normal rhythm and rate Abdomen: Non tender, bowel sounds present Extremities: No edema Pulses: Warm and well perfused. 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 Strenght 5/5 all extremities. Ambulating with rolling walker  Basic Metabolic Panel:  Recent Labs Lab 06/23/14 0025 06/23/14 0036 06/23/14 0651  NA 140 142 141  K 4.1 3.9 4.3  CL 102 105 104  CO2 26  --  25  GLUCOSE 100* 102* 106*  BUN 12 11 10   CREATININE 1.12 1.20 0.99  CALCIUM 9.5  --  9.1   Liver Function Tests:  Recent Labs Lab 06/23/14 0025  AST 23  ALT 15  ALKPHOS 54  BILITOT 0.3  PROT 7.3  ALBUMIN 3.5   CBC:  Recent Labs Lab 06/23/14 0025 06/23/14 0036 06/23/14 0651  WBC 7.1  --  7.9  NEUTROABS 3.6  --   --   HGB 12.7* 14.6 12.9*  HCT 39.1 43.0 39.6  MCV 86.3  --  85.7  PLT 188  --  165   CBG:  Recent Labs Lab 06/24/14 2218 06/25/14 0648 06/25/14 1141 06/25/14 1630 06/25/14 2150 06/26/14 0613  GLUCAP 171* 131* 151* 142* 164* 123*   Fasting Lipid Panel:  Recent Labs Lab 06/23/14 0645  CHOL 118  HDL 31*  LDLCALC 78  TRIG 44  CHOLHDL 3.8   Coagulation:  Recent Labs Lab  06/23/14 0034  LABPROT 13.4  INR 1.01   Urine Drug Screen: Drugs of Abuse     Component Value Date/Time   LABOPIA NONE DETECTED 06/23/2014 0220   COCAINSCRNUR NONE DETECTED 06/23/2014 0220   LABBENZ NONE DETECTED 06/23/2014 0220   AMPHETMU NONE DETECTED 06/23/2014 0220   THCU POSITIVE* 06/23/2014 0220   LABBARB NONE DETECTED 06/23/2014 0220    Alcohol Level:  Recent Labs Lab 06/23/14 0034  ETH <11   Urinalysis:  Recent Labs Lab 06/23/14 0220  COLORURINE YELLOW  LABSPEC 1.014  PHURINE 6.5  GLUCOSEU NEGATIVE  HGBUR NEGATIVE  BILIRUBINUR NEGATIVE  KETONESUR NEGATIVE  PROTEINUR NEGATIVE  UROBILINOGEN 0.2  NITRITE NEGATIVE  LEUKOCYTESUR NEGATIVE   Studies/Results: No results found. Scheduled Meds: . aspirin  325 mg Oral Daily  . atorvastatin  40 mg Oral q1800  . heparin  5,000 Units Subcutaneous 3 times per day  . insulin aspart  0-9 Units Subcutaneous TID WC  . 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 Multiple risk factors for stroke including DM2, HLD, HTN, smoking as well as race, sex and family history.  Despite neg MRI still possible had a stroke, per neurology. - Neurology recs appreciated.  Discharge home on Aspirin and plavix daily for 3 months, and then plavix after that. - Continue ASA 325mg  daily - Continue home Zocor 40mg  as Lipitor 40mg  daily - PT recs- CIR, eval by CIR, to be admitted once bed is available. Discharge once bed is available, [facility]  DM2: On metformin 1500mg  QAM and 500mg  QPM at home. -SSI-s- has required 1-2 units of insulin, with some meals. - Consider low dose glipizide if needed as outpt. - Discharge home on home metformin.  HTN- Blood pressure elevated since admission. Initially held to allow for permissive HTN.  - Start  lisinopril- 10mg  daily, as pt has diabetes, up titrate on follow up  Schizophrenia: on haldol and Invega. Could not confirm pts medication dose. All Pharmacies listed for pt, never filled this med. Will not restart. Follow up as outpt. Tried contacting pts family to get pill bottles so medications can be confirmed. Emergency contact number pts sister, not functioning.    FEN:  - Carb mod.  DVT ppx: subQ hep  Dispo: Discharge today to CIR.  The patient does have a current PCP Gara Kroner(Diana Truong, MD) and [facility] after discharge.  The patient does have transportation limitations that hinder transportation to clinic appointments.  .Services Needed at time of discharge: Y = Yes, Blank = No PT:   OT:   RN:   Equipment:   Other:     LOS: 3 days   Onnie BoerEjiroghene E Jocob Dambach, MD 06/26/2014, 10:10 AM

## 2014-06-26 NOTE — Progress Notes (Signed)
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 Patient lives with his sister and was independent prior to admission. Admitted 06/23/2014 with increased left-sided weakness, slurred speech and blurred vision . 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 Neurology follow-up suspect posterior circulation infarcts secondary to small vessel disease.echocardiogram with ejection fraction of 50% grade 1 diastolic dysfunction. Carotid Dopplers with left 40-59% ICA stenosis. Urine drug screen upon admission positive marijuana. Patient did not receive TPA. Neurology services consulted presently maintained on aspirin 325 mg daily for CVA prophylaxis as well as subcutaneous heparin for DVT prophylaxis. Patient is tolerating a regular consistency diet. Physical therapy evaluation completed 06/24/2014 with recommendations of physical medicine rehabilitation consult.   Review of Systems  Eyes: Positive for blurred vision.  Gastrointestinal: Positive for constipation.  Musculoskeletal: Positive for myalgias.  Psychiatric/Behavioral:   Schizophrenia, anxiety   Past Medical History  Diagnosis Date  . Diabetes mellitus   . Hypertension   . History of stroke    History reviewed. No pertinent  past surgical history. Family History  Problem Relation Age of Onset  . Hypertension Mother   . Hypertension Father   . Coronary artery disease Maternal Aunt   . Diabetes Maternal Aunt   . Coronary artery disease Maternal Uncle   . Diabetes Maternal Uncle   . Diabetes Paternal Aunt   . Coronary artery disease Paternal Aunt   . Diabetes Paternal Uncle   . Coronary artery disease Paternal Uncle    Social History:  reports that he has been smoking Cigarettes. He has a 1 pack-year smoking history. He does not have any smokeless tobacco history on file. He reports that he drinks alcohol. He reports that he uses illicit drugs. Allergies: No Known Allergies Medications Prior to Admission  Medication Sig Dispense Refill  . metFORMIN (GLUCOPHAGE) 1000 MG tablet Take 1,000 mg by mouth daily with breakfast.    . aspirin 325 MG EC tablet Take 325 mg by mouth daily.     . haloperidol lactate (HALDOL) 5 MG/ML injection every 6 (six) hours as needed. Unsure of dosing, pt gets from mental health q 4 weeks chronically.     Hinda Glatter SUSTENNA 117 MG/0.75ML SUSP     . metFORMIN (GLUCOPHAGE) 1000 MG tablet TAKE 0.5 TABLETS (500 MG TOTAL) BY MOUTH 2 (TWO) TIMES DAILY WITH A MEAL. 60 tablet 3  . simvastatin (ZOCOR) 40 MG tablet Take 1 tablet (40 mg total) by mouth every evening. 30 tablet 11    Home: Home Living Family/patient expects to be discharged to:: Private residence Living Arrangements: Other relatives (Sister) Available Help at Discharge: Family, Available 24 hours/day Type of Home: House Home Access: Stairs to enter [employer] of Steps: 5 Entrance Stairs-Rails: Right, Left Home Layout: One level Home Equipment: None Lives With: Family (Sister)  Functional History: Prior Function Level of Independence: Independent Comments: Independent with ambulation, ADL's. Sister provides  meals and  transportation. Functional Status:  Mobility: Bed Mobility Overal bed mobility: Needs Assistance Bed Mobility: Supine to Sit, Sit to Supine Supine to sit: Min assist Sit to supine: Min guard General bed mobility comments: Verbal cues for technique. Assist to move trunk to sitting position. Patient with decreased balance in sitting, leaning to left. Assist to maintain balance while trying to use urinal in sitting. Required support for trunk. UE support needed for static balance. Transfers Overall transfer level: Needs assistance Equipment used: None, Rolling walker (2 wheeled) Transfers: Sit to/from Stand Sit to Stand: Mod assist General transfer comment: Verbal cues for technique. Stood x1 with no assistive device. Patient required mod assist to maintain upright balance without UE support, leaning to left side. Returned to sitting. Patient then stood with RW for UE support. Able to maintain static balance with RW with min guard assist. Ambulation/Gait Ambulation/Gait assistance: Min assist, Mod assist Ambulation Distance (Feet): 24 Feet Assistive device: Rolling walker (2 wheeled) Gait Pattern/deviations: Step-through pattern, Decreased step length - right, Decreased step length - left, Decreased stride length, Decreased weight shift to left, Shuffle, Ataxic, Trunk flexed, Narrow base of support (Leaning to left) Gait velocity: Decreased Gait velocity interpretation: Below normal speed for age/gender General Gait Details: Verbal cues for safe use of RW. Patient required min assist to maintain balance during gait - leaning to left. Loss of balance x2 requiring mod assist to prevent fall. Gait ataxic, with decreased LE control. Patient running into door jam and chair with RW - question result of vision changes.     ADL:    Cognition: Cognition Overall Cognitive Status: Within Functional Limits for tasks assessed Orientation Level: Oriented  X4 Cognition Arousal/Alertness: Awake/alert Behavior During Therapy: Flat affect Overall Cognitive Status: Within Functional Limits for tasks assessed  Blood pressure 152/76, pulse 70, temperature 99.3 F (37.4 C), temperature source Oral, resp. rate 20, height 5\' 10"  (1.778 m), weight 74.617 kg (164 lb 8 oz), SpO2 99 %. Physical Exam  Constitutional: He appears well-developed.  Eyes:  Pupils reactive to light. Left eye with abduction and limited upward and downward gaze  Neck: Normal range of motion. Neck supple. No thyromegaly present.  Cardiovascular: Normal rate and regular rhythm.  Respiratory: Effort normal and breath sounds normal. No respiratory distress.  GI: Soft. Bowel sounds are normal. He exhibits no distension.  Neurological: He is alert.  Patient is oriented to person, place and date of birth as well as his age. He is a very limited medical historian. He does follow simple commands. Dysconjugate gaze, INO. Speech slurred. Sensation 1/2 left face/arm/leg. LUE 3/5. LLE 2+ to 3/5 proximal to distal.     Lab Results Last 24 Hours    Results for orders placed or performed during the hospital encounter of 06/23/14 (from the past 24 hour(s))  Glucose, capillary Status: None   Collection Time: 06/24/14 7:36 AM  Result Value Ref Range   Glucose-Capillary 92 70 - 99 mg/dL   Comment 1 Notify RN    Comment 2 Documented in Chart   Glucose, capillary Status: Abnormal   Collection Time: 06/24/14 11:42 AM  Result Value Ref Range   Glucose-Capillary 107 (H) 70 - 99 mg/dL   Comment 1 Notify RN    Comment 2 Documented in Chart   Glucose, capillary Status: Abnormal   Collection Time: 06/24/14 5:34 PM  Result Value Ref Range   Glucose-Capillary 113 (H) 70 - 99 mg/dL  Glucose, capillary Status: Abnormal   Collection Time: 06/24/14  10:18 PM  Result Value Ref Range   Glucose-Capillary 171 (H) 70 - 99 mg/dL    Comment 1 Repeat Test    Comment 2 Documented in Chart    Comment 3 Notify RN       Imaging Results (Last 48 hours)    Mr Shirlee Latch Wo Contrast  06/23/2014 CLINICAL DATA: Double vision in the left eye for the last week. Stroke like symptoms. Acute loss of balance beginning at midnight last night. Acute onset of left-sided weakness. EXAM: MRI HEAD WITHOUT CONTRAST MRA HEAD WITHOUT CONTRAST TECHNIQUE: Multiplanar, multiecho pulse sequences of the brain and surrounding structures were obtained without intravenous contrast. Angiographic images of the head were obtained using MRA technique without contrast. COMPARISON: CT head from the same day. MRI brain 11/06/2009 FINDINGS: MRI HEAD FINDINGS Remote right cerebellar infarcts are again noted. The diffusion-weighted images demonstrate no evidence for acute or subacute infarct. No acute hemorrhage or mass lesion is present. There are remote blood products associated with the inferior right cerebellar infarct. Remote lacunar infarcts are present within the basal ganglia and thalami bilaterally. There are remote lacunar infarcts in the brainstem. Flow is present in the major intracranial arteries. The globes and orbits are intact. Mild mucosal thickening is present in the left maxillary sinus and anterior ethmoid air cells bilaterally. MRA HEAD FINDINGS The study is moderately degraded by patient motion, limiting evaluation of small vessels. The most significant motion is in the lower slab, which affects the basilar and internal carotid arteries. Signal loss at the slab overlap is likely artifactual. Atherosclerotic changes are present within the cavernous internal carotid arteries bilaterally without a definite stenosis. The A1 and M1 segments are intact. The anterior communicating artery is not discretely visualized. The MCA bifurcations are intact. There is asymmetric attenuation of left-sided MCA branch vessels compared to the  right. The left vertebral artery is the dominant vessel. Signal loss in the proximal basilar artery is artifactual. The distal basilar artery is intact. There is moderate attenuation of distal PCA branch vessels. IMPRESSION: 1. No acute infarct. 2. Remote right cerebellar infarcts with some remote blood products associated. 3. Remote lacunar infarcts of the basal ganglia and brainstem. 4. Atrophy and diffuse white matter disease reflects the sequela of chronic microvascular ischemia. 5. The MRA circle of Willis is moderately degraded by patient motion. 6. Atherosclerotic changes in the cavernous internal carotid arteries bilaterally without definite significant stenosis. 7. Medium and small vessel attenuation diffusely is most prominent in the left MCA branch vessels. This may reflect stenosis within the intracranial ICA. Electronically Signed By: Gennette Pac M.D. On: 06/23/2014 12:12   [name] Contrast  06/23/2014 CLINICAL DATA: Double vision in the left eye for the last week. Stroke like symptoms. Acute loss of balance beginning at midnight last night. Acute onset of left-sided weakness. EXAM: MRI HEAD WITHOUT CONTRAST MRA HEAD WITHOUT CONTRAST TECHNIQUE: Multiplanar, multiecho pulse sequences of the brain and surrounding structures were obtained without intravenous contrast. Angiographic images of the head were obtained using MRA technique without contrast. COMPARISON: CT head from the same day. MRI brain 11/06/2009 FINDINGS: MRI HEAD FINDINGS Remote right cerebellar infarcts are again noted. The diffusion-weighted images demonstrate no evidence for acute or subacute infarct. No acute hemorrhage or mass lesion is present. There are remote blood products associated with the inferior right cerebellar infarct. Remote lacunar infarcts are present within the basal ganglia and thalami bilaterally. There are remote lacunar infarcts in the brainstem. Flow is present in the  major  intracranial arteries. The globes and orbits are intact. Mild mucosal thickening is present in the left maxillary sinus and anterior ethmoid air cells bilaterally. MRA HEAD FINDINGS The study is moderately degraded by patient motion, limiting evaluation of small vessels. The most significant motion is in the lower slab, which affects the basilar and internal carotid arteries. Signal loss at the slab overlap is likely artifactual. Atherosclerotic changes are present within the cavernous internal carotid arteries bilaterally without a definite stenosis. The A1 and M1 segments are intact. The anterior communicating artery is not discretely visualized. The MCA bifurcations are intact. There is asymmetric attenuation of left-sided MCA branch vessels compared to the right. The left vertebral artery is the dominant vessel. Signal loss in the proximal basilar artery is artifactual. The distal basilar artery is intact. There is moderate attenuation of distal PCA branch vessels. IMPRESSION: 1. No acute infarct. 2. Remote right cerebellar infarcts with some remote blood products associated. 3. Remote lacunar infarcts of the basal ganglia and brainstem. 4. Atrophy and diffuse white matter disease reflects the sequela of chronic microvascular ischemia. 5. The MRA circle of Willis is moderately degraded by patient motion. 6. Atherosclerotic changes in the cavernous internal carotid arteries bilaterally without definite significant stenosis. 7. Medium and small vessel attenuation diffusely is most prominent in the left MCA branch vessels. This may reflect stenosis within the intracranial ICA. Electronically Signed By: Gennette Pachris Mattern M.D. On: 06/23/2014 12:12     Assessment/Plan: Diagnosis: new ? Brainstem And/or posterior circulation infarcts with impaired balance, left sided sensation and visual spatial deficits 1. Does the need for close, 24 hr/day medical supervision in concert with the patient's rehab needs  make it unreasonable for this patient to be served in a less intensive setting? Yes 2. Co-Morbidities requiring supervision/potential complications: drug abuse,  3. Due to bladder management, bowel management, safety, skin/wound care, disease management, medication administration, pain management and patient education, does the patient require 24 hr/day rehab nursing? Yes 4. Does the patient require coordinated care of a physician, rehab nurse, PT (1-2 hrs/day, 5 days/week), OT (1-2 hrs/day, 5 days/week) and SLP (1-2 hrs/day, 5 days/week) to address physical and functional deficits in the context of the above medical diagnosis(es)? Yes Addressing deficits in the following areas: balance, endurance, locomotion, strength, transferring, bowel/bladder control, bathing, dressing, feeding, grooming, toileting, cognition, speech and psychosocial support 5. Can the patient actively participate in an intensive therapy program of at least 3 hrs of therapy per day at least 5 days per week? Yes 6. The potential for patient to make measurable gains while on inpatient rehab is good 7. Anticipated functional outcomes upon discharge from inpatient rehab are supervision with PT, supervision and min assist with OT, supervision with SLP. 8. Estimated rehab length of stay to reach the above functional goals is: 7 days+ 9. Does the patient have adequate social supports to accommodate these discharge functional goals? Yes 10. Anticipated D/C setting: Home 11. Anticipated post D/C treatments: HH therapy and Outpatient therapy 12. Overall Rehab/Functional Prognosis: good  RECOMMENDATIONS: This patient's condition is appropriate for continued rehabilitative care in the following setting: CIR Patient has agreed to participate in recommended program. Yes Note that insurance prior authorization may be required for reimbursement for recommended care.  Comment: Rehab Admissions Coordinator to follow up.  Thanks,  Ranelle OysterZachary  T. Swartz, MD, [place] DomFAAPMR     06/25/2014

## 2014-06-26 NOTE — Progress Notes (Signed)
Report given to [place] Rapids[place]ber[place], RN  [facility]. All belongings sent with patient. Patient denied pain. No other distress noted. Patient is transferring to 4W10.   Sim BoastHavy, RN

## 2014-06-26 NOTE — Progress Notes (Signed)
Received patient from 4N.  Oriented patient to room and unit.  Vitals obtained; denies pain.  Will continue to monitor.

## 2014-06-26 NOTE — Progress Notes (Signed)
Inpatient Rehabilitation  I met with [name] Williams this am to inform him that I do have a CIR bed available for him today.  He desires to come to IP Rehab.  I discussed with [name] Williams, medical student and he gives OK to proceed with admission to rehab.  I also notified [name], RN and [name] Williams CM of the plan . I have left a voice mail for pt's sister [name] Williams.   Please call if questions.  [facility] Admissions Coordinator Cell [phone] Office [phone]

## 2014-06-26 NOTE — PMR Pre-admission (Signed)
PMR Admission Coordinator Pre-Admission Assessment  Patient: [name] Williams is an 58 y.o., male MRN: [id number] DOB: Mar 18, 1956 Height: 5\' 10"  (177.8 cm) Weight: 74.617 kg (164 lb 8 oz)              Insurance Information HMO:   PPO:      PCP:   no     IPA:   no     80/20:   no     OTHER:   no PRIMARY:   Medicare A and B      Policy#:  [id number]      Subscriber:  self CM Name:        Phone#:      Fax#:  Pre-Cert#:       Employer: disabled Benefits:  Phone #:    Name:  Eff. Date:   04/16/86   Deduct:  $1260      Out of Pocket Max:  No       Life Max: no CIR:  100%      SNF:  100% first 20 days per Medicare Outpatient:  80%     Co-Pay:  20% Home Health:  100%      Co-Pay:   DME:  80%     Co-Pay:  20% Providers:  Pt. choice  Emergency Contact Information Contact Information    Name Relation Home Work Mobile   [place],[name] Sister   [phone]     Current Medical History  Patient Admitting Diagnosis: new ? 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 Patient lives with his sister and was independent prior to admission. Admitted 06/23/2014 with increased left-sided weakness, slurred speech and blurred vision . 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 Neurology follow-up suspect posterior circulation infarcts secondary to small vessel disease.echocardiogram with ejection fraction of 50% grade 1 diastolic dysfunction. Carotid Dopplers with left 40-59% ICA stenosis. Urine drug screen upon admission positive marijuana. Patient did not  receive TPA. Neurology services consulted presently maintained on aspirin 325 mg daily for CVA prophylaxis as well as subcutaneous heparin for DVT prophylaxis. Patient is tolerating a regular consistency diet. Physical and occupational therapy evaluations completed 06/24/2014 with recommendations of physical medicine rehabilitation consult.Patient was admitted for comprehensive rehab program. Total: 7 NIH    Past Medical History  Past Medical History  Diagnosis Date  . Diabetes mellitus   . Hypertension   . History of stroke     Family History  family history includes Coronary artery disease in his maternal aunt, maternal uncle, paternal aunt, and paternal uncle; Diabetes in his maternal aunt, maternal uncle, paternal aunt, and paternal uncle; Hypertension in his father and mother.  Prior Rehab/Hospitalizations: March 2001, CVA   Current Medications  Current facility-administered medications: aspirin EC tablet 325 mg, 325 mg, Oral, Daily, Genelle GatherKathryn F Glenn, MD, 325 mg at 06/26/14 [id number];  atorvastatin (LIPITOR) tablet 40 mg, 40 mg, Oral, q1800, Genelle GatherKathryn F Glenn, MD, 40 mg at 06/25/14 1717;  heparin injection 5,000 Units, 5,000 Units, Subcutaneous, 3 times per day, Genelle GatherKathryn F Glenn, MD, 5,000 Units at 06/26/14 0609 insulin aspart (novoLOG) injection 0-9 Units, 0-9 Units, Subcutaneous, TID WC, Ejiroghene E  Emokpae, MD, 1 Units at 06/26/14 [id number];  lisinopril (PRINIVIL,ZESTRIL) tablet 10 mg, 10 mg, Oral, Daily, Ejiroghene E Emokpae, MD, 10 mg at 06/26/14 [id number];  senna-docusate (Senokot-S) tablet 1 tablet, 1 tablet, Oral, QHS PRN, Genelle GatherKathryn F Glenn, MD  Patients Current Diet: Diet heart healthy/carb modified  Precautions / Restrictions Precautions Precautions: Fall Precaution Comments: s/p fall at home with dizziness and unsteady gait Restrictions Weight Bearing Restrictions: No   Prior Activity Level Limited Community (1-2x/wk): Pt. reports he walks to the local department store 3-4 times per week;  he does not drive.  Sister transports him to his appointments Home Assistive Devices / Equipment Home Assistive Devices/Equipment: None Home Equipment: None  Prior Functional Level Prior Function Level of Independence: Independent Comments: Independent with ambulation, ADL's.  Sister provides meals and transportation.  Current Functional Level Cognition  Overall Cognitive Status: Within Functional Limits for tasks assessed Orientation Level: Oriented X4    Extremity Assessment (includes Sensation/Coordination)          ADLs  Overall ADL's : Needs assistance/impaired Grooming: Oral care, Moderate assistance, Standing Grooming Details (indicate cue type and reason): requires containers opened and tactile input for static standing. pt with L lateral lean. Pt requires use of single UE support throughout session Toilet Transfer: Minimal assistance Functional mobility during ADLs: Moderate assistance General ADL Comments: Bed mobility difficult see below. pt unable to sustain static sitting EOB. pt posterior L lean and LOB with don of sock. pt with strong L lean with ambulation reaching for environmental supports. Pt could benefit from RW for stability. pt complete oral care sink level    Mobility  Overal bed mobility: Needs Assistance Bed Mobility: Supine to Sit Supine to sit: Min assist Sit to supine: Min guard General bed mobility comments: Patient up in recliner before and after session    Transfers  Overall transfer level: Needs assistance Equipment used: Rolling walker (2 wheeled) Transfers: Sit to/from Stand Sit to Stand: Min assist General transfer comment: Min A to ensure balance this session. patient with less posterior lean. A to find RW on L side    Ambulation / Gait / Stairs / Wheelchair Mobility  Ambulation/Gait Ambulation/Gait assistance: Mod assist Ambulation Distance (Feet): 60 Feet Assistive device: Rolling walker (2 wheeled) Gait Pattern/deviations:  Step-through pattern, Decreased step length - right, Decreased step length - left, Narrow base of support Gait velocity: Decreased Gait velocity interpretation: Below normal speed for age/gender General Gait Details: Verbal cues for safe use of RW.  Patient required mod assist to maintain balance during gait - leaning to left.   No LOB noted this session but did require mod A for hip/shoulder facilitation to R side. A for RW management and to avoid obstacles on L side    Posture / Balance Dynamic Sitting Balance Sitting balance - Comments: immediate LOB posterior lateral    Special needs/care consideration BiPAP/CPAP  no Skin  per RN [name], skin intact                              Bowel mgmt:    Last BM pre pt. 06/23/14; not documented in nursing notes Bladder mgmt: RN Sim BoastHavy reports incontinent of urine; pt. Reports attempting to use urinal Diabetic mgmt  A1C 6.4     Previous Home Environment Living Arrangements: Other relatives  Lives With: Family (Sister) Available Help at Discharge: Family, Available 24 hours/day Type of Home: House Home Layout: One level Home Access:  Stairs to enter Entrance Stairs-Rails: Right, Left Entrance Stairs-Number of Steps: 5 Bathroom Shower/Tub: Engineer, manufacturing systems: Standard Home Care Services: No  Discharge Living Setting Plans for Discharge Living Setting: Patient's home, Lives with (comment) (sister [name] Dovel) Type of Home at Discharge: House Discharge Home Layout: One level Discharge Home Access: Stairs to enter Entrance Stairs-Rails: Right, Left Entrance Stairs-Number of Steps: 5 Discharge Bathroom Shower/Tub: Tub/shower unit Discharge Bathroom Toilet: Standard Discharge Bathroom Accessibility: Yes How Accessible: Accessible via walker Does the patient have any problems obtaining your medications?: No  Social/Family/Support Systems Patient Roles: Other (Comment) (sibling to 3 sisters) Anticipated Caregiver: Dayquan Buys Anticipated Caregiver's Contact Information: [phone] Ability/Limitations of Caregiver: Chyrl Civatte works during the day.  Chyrl Civatte is trying to arrange with Clara (another sister) for her to supervise pt. while [name] works Engineer, structural Availability: Other (Comment) Chyrl Civatte is working on 24 hour care) Discharge Plan Discussed with Primary Caregiver: Yes Is Caregiver In Agreement with Plan?: Yes Does Caregiver/Family have Issues with Lodging/Transportation while Pt is in Rehab?: No    Goals/Additional Needs Patient/Family Goal for Rehab: supervision for PT and SLP; supervision to min assist for OT Expected length of stay: 7 days Cultural Considerations: none Equipment Needs: TBD Pt/Family Agrees to Admission and willing to participate: Yes Program Orientation Provided & Reviewed with Pt/Caregiver Including Roles  & Responsibilities: Yes   Decrease burden of Care through IP rehab admission: no   Possible need for SNF placement upon discharge:  Not planned   Patient Condition: This patient's condition remains as documented in the consult dated 06/25/14 , in which the Rehabilitation Physician determined and documented that the patient's condition is appropriate for intensive rehabilitative care in an inpatient rehabilitation facility. Will admit to inpatient rehab today.  Preadmission Screen Completed By:  Weldon Picking, 06/26/2014 10:34 AM ______________________________________________________________________   Discussed status with Dr.  Riley Kill on 06/26/14 at  1046  and received telephone approval for admission today.  Admission Coordinator:  Weldon Picking, time 0981 /Date 06/26/14

## 2014-06-26 NOTE — H&P (Signed)
Physical Medicine and Rehabilitation Admission H&P   Chief Complaint  Patient presents with  . 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 Patient lives with his sister and was independent prior to admission. Admitted 06/23/2014 with increased left-sided weakness, slurred speech and blurred vision . 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 Neurology follow-up suspect posterior circulation infarcts secondary to small vessel disease.echocardiogram with ejection fraction of 50% grade 1 diastolic dysfunction. Carotid Dopplers with left 40-59% ICA stenosis. Urine drug screen upon admission positive marijuana. Patient did not receive TPA. Neurology services consulted presently maintained on aspirin 325 mg daily for CVA prophylaxis as well as subcutaneous heparin for DVT prophylaxis. Patient is tolerating a regular consistency diet. Physical and occupational therapy evaluations completed 06/24/2014 with recommendations of physical medicine rehabilitation consult.Patient was admitted for comprehensive rehab program.  ROS Review of Systems  Eyes: Positive for blurred vision.  Gastrointestinal: Positive for constipation.  Musculoskeletal: Positive for myalgias.  Psychiatric/Behavioral:   Schizophrenia, anxiety   Past Medical History  Diagnosis Date  . Diabetes mellitus   . Hypertension   . History of stroke    History reviewed. No pertinent past surgical history. Family History  Problem Relation Age of Onset  . Hypertension Mother   . Hypertension Father   . Coronary artery disease Maternal Aunt   . Diabetes  Maternal Aunt   . Coronary artery disease Maternal Uncle   . Diabetes Maternal Uncle   . Diabetes Paternal Aunt   . Coronary artery disease Paternal Aunt   . Diabetes Paternal Uncle   . Coronary artery disease Paternal Uncle    Social History:  reports that he has been smoking Cigarettes. He has a 1 pack-year smoking history. He does not have any smokeless tobacco history on file. He reports that he drinks alcohol. He reports that he uses illicit drugs. Allergies: No Known Allergies Medications Prior to Admission  Medication Sig Dispense Refill  . metFORMIN (GLUCOPHAGE) 1000 MG tablet Take 1,000 mg by mouth daily with breakfast.    . aspirin 325 MG EC tablet Take 325 mg by mouth daily.     . haloperidol lactate (HALDOL) 5 MG/ML injection every 6 (six) hours as needed. Unsure of dosing, pt gets from mental health q 4 weeks chronically.     Hinda Glatter SUSTENNA 117 MG/0.75ML SUSP     . metFORMIN (GLUCOPHAGE) 1000 MG tablet TAKE 0.5 TABLETS (500 MG TOTAL) BY MOUTH 2 (TWO) TIMES DAILY WITH A MEAL. 60 tablet 3  . simvastatin (ZOCOR) 40 MG tablet Take 1 tablet (40 mg total) by mouth every evening. 30 tablet 11    Home: Home Living Family/patient expects to be discharged to:: Private residence Living Arrangements: Other relatives Available Help at Discharge: Family, Available 24 hours/day Type of Home: House Home Access: Stairs to enter [employer] of Steps: 5 Entrance Stairs-Rails: Right, Left Home Layout: One level Home Equipment: None Lives With: Family (Sister)  Functional History: Prior Function Level of Independence: Independent Comments: Independent with ambulation, ADL's. Sister provides meals and transportation.  Functional Status:  Mobility: Bed Mobility Overal bed mobility: Needs Assistance Bed Mobility: Supine to Sit Supine to sit: Min assist Sit to supine: Min guard General bed mobility  comments: Patient up in recliner before and after session Transfers Overall transfer level: Needs assistance Equipment used: Rolling walker (2 wheeled) Transfers: Sit to/from Stand Sit to Stand: Mod assist General transfer comment: Mod A initially for first stand to power up into standing and to ensure anterior weight shift as patient tends to put increased weight posterior. Stood x5 needing only min A for gain balance but continued with posterior and left side lean. Able to self adjust more with use of RW. A to place hand on RW Ambulation/Gait Ambulation/Gait assistance: Min assist Ambulation Distance (Feet): 80 Feet Assistive device: Rolling walker (2 wheeled) Gait Pattern/deviations: Step-to pattern, Decreased step length - left, Decreased stride length, Decreased weight shift to left, Narrow base of support Gait velocity: Decreased Gait velocity interpretation: Below normal speed for age/gender General Gait Details: Verbal cues for safe use of RW. Patient required min assist to maintain balance during gait - leaning to left. No LOB noted this session but did require min A for hip/shoulder facilitation to R side. A for RW management and to avoid obstacles on L side    ADL: ADL Overall ADL's : Needs assistance/impaired Grooming: Oral care, Moderate assistance, Standing Grooming Details (indicate cue type and reason): requires containers opened and tactile input for static standing. pt with L lateral lean. Pt requires use of single UE support throughout session Toilet Transfer: Minimal assistance Functional mobility during ADLs: Moderate assistance General ADL Comments: Bed mobility difficult see below. pt unable to sustain static sitting EOB. pt posterior L lean and LOB with don of sock. pt with strong L lean with ambulation reaching for environmental supports. Pt could benefit from RW for stability. pt complete oral care sink level  Cognition: Cognition Overall Cognitive Status:  Within Functional Limits for tasks assessed Orientation Level: Oriented X4 Cognition Arousal/Alertness: Awake/alert Behavior During Therapy: Flat affect Overall Cognitive Status: Within Functional Limits for tasks assessed  Physical Exam: Blood pressure 132/67, pulse 65, temperature 98.7 F (37.1 C), temperature source Oral, resp. rate 18, height 5\' 10"  (1.778 m), weight 74.617 kg (164 lb 8 oz), SpO2 98 %. Physical Exam Constitutional: He appears well-developed.  Eyes:  Pupils reactive to light. Left eye with abduction and limited upward and downward gaze  Neck: Normal range of motion. Neck supple. No thyromegaly present.  Cardiovascular: Normal rate and regular rhythm.  Respiratory: Effort normal and breath sounds normal. No respiratory distress.  GI: Soft. Bowel sounds are normal. He exhibits no distension.  Neurological: He is alert.  Patient is oriented to person, place and date of birth as well as his age. He is a very limited medical historian. He does follow simple commands. Dysconjugate gaze, Right INO with inability to adduct Right eye and left eye with nystagmus during leftward gaze. Speech slurred. Sensation 1/2 left face/arm/leg. LUE 3/5 deltoid, bicep, tricep, HI. LLE 2+ HF, KE 3/5 , 3+ to 4 ADF/APF. Psych: pt generally pleasant and cooperative   Lab Results Last 48 Hours    Results for orders placed or performed during the hospital encounter of 06/23/14 (from the past 48 hour(s))  Glucose, capillary Status: Abnormal   Collection Time: 06/24/14 11:42 AM  Result Value Ref Range   Glucose-Capillary 107 (H) 70 - 99 mg/dL   Comment 1 Notify RN    Comment 2 Documented in Chart   Glucose, capillary Status: Abnormal   Collection Time: 06/24/14 5:34 PM  Result Value Ref Range   Glucose-Capillary 113 (H) 70 - 99 mg/dL  Glucose, capillary  Status: Abnormal   Collection Time: 06/24/14 10:18 PM  Result Value Ref Range    Glucose-Capillary 171 (H) 70 - 99 mg/dL   Comment 1 Repeat Test    Comment 2 Documented in Chart    Comment 3 Notify RN   Glucose, capillary Status: Abnormal   Collection Time: 06/25/14 6:48 AM  Result Value Ref Range   Glucose-Capillary 131 (H) 70 - 99 mg/dL   Comment 1 Documented in Chart    Comment 2 Notify RN   Glucose, capillary Status: Abnormal   Collection Time: 06/25/14 11:41 AM  Result Value Ref Range   Glucose-Capillary 151 (H) 70 - 99 mg/dL  Glucose, capillary Status: Abnormal   Collection Time: 06/25/14 4:30 PM  Result Value Ref Range   Glucose-Capillary 142 (H) 70 - 99 mg/dL  Glucose, capillary Status: Abnormal   Collection Time: 06/25/14 9:50 PM  Result Value Ref Range   Glucose-Capillary 164 (H) 70 - 99 mg/dL   Comment 1 Documented in Chart    Comment 2 Notify RN   Glucose, capillary Status: Abnormal   Collection Time: 06/26/14 6:13 AM  Result Value Ref Range   Glucose-Capillary 123 (H) 70 - 99 mg/dL      Imaging Results (Last 48 hours)    No results found.       Medical Problem List and Plan: 1. Functional deficits secondary to likely Right Brainstem and /or posterior circulation infarcts with impaired balance,left hemisensory loss and visual deficits  2. DVT Prophylaxis/Anticoagulation: SQ heparin.Monitor platelet counts and any signs of bleeding 3. Pain Management: tylenol as needed 4. Hypertension.Lisinopril 10 mg daily.Monitor with increased mobility 5. Neuropsych: This patient is capable of making decisions on his own behalf. 6. Skin/Wound Care: routine skin checks 7. Fluids/Electrolytes/Nutrition: strict I&O,s Follow up labs 8.Hyperlipidemia. Lipitor 40 mg daily 9.Diabetes mellitus with peripheral neuropathy. HGB A1c 6.4.Check blood sugars AC&HS.Patient on Glucophage 1500 mg Qam and 500 mg pm prior to admission.Resume as needed 10  Mood/Schizophrenia.Patient on haldol and invega in the past but never refilled med for quite some time and never restarted 11.UDS positive marijuana .Provide counseling  Post Admission Physician Evaluation: 1. Functional deficits secondary to right brainstem and ?posterior circulation infarcts. 2. Patient is admitted to receive collaborative, interdisciplinary care between the physiatrist, rehab nursing staff, and therapy team. 3. Patient's level of medical complexity and substantial therapy needs in context of that medical necessity cannot be provided at a lesser intensity of care such as a SNF. 4. Patient has experienced substantial functional loss from his/her baseline which was documented above under the "Functional History" and "Functional Status" headings. Judging by the patient's diagnosis, physical exam, and functional history, the patient has potential for functional progress which will result in measurable gains while on inpatient rehab. These gains will be of substantial and practical use upon discharge in facilitating mobility and self-care at the household level. 5. Physiatrist will provide 24 hour management of medical needs as well as oversight of the therapy plan/treatment and provide guidance as appropriate regarding the interaction of the two. 6. 24 hour rehab nursing will assist with bladder management, bowel management, safety, skin/wound care, disease management, medication administration, pain management and patient education and help integrate therapy concepts, techniques,education, etc. 7. PT will assess and treat for/with: Lower extremity strength, range of motion, stamina, balance, functional mobility, safety, adaptive techniques and equipment, NMR, cognitive perceptual awareness, visual spatial rx, family education, community reintegration. Goals are: mod I to supervision. 8. OT will assess and treat  for/with: ADL's, functional mobility, safety, upper extremity strength,  adaptive techniques and equipment, NMR, visual-perceptual awareness, family ed, stroke ed, leisure awareness. Goals are: mod I to supervision. Therapy may proceed with showering this patient. 9. SLP will assess and treat for/with: speech, cognition. Goals are: mod I. 10. Case Management and Social Worker will assess and treat for psychological issues and discharge planning. 11. Team conference will be held weekly to assess progress toward goals and to determine barriers to discharge. 12. Patient will receive at least 3 hours of therapy per day at least 5 days per week. 13. ELOS: 7-10 days  14. Prognosis: excellent  Had discussion with the patient regarding his health hygiene and substance abuse history. Impressed upon him the importance upon making changes moving forward. He expressed an understanding and agreed with me.     Ranelle OysterZachary T. Swartz, MD, [facility] HospitalFAAPMR [facility] 06/26/2014

## 2014-06-27 ENCOUNTER — Inpatient Hospital Stay (HOSPITAL_COMMUNITY): Payer: Medicare Other | Admitting: Occupational Therapy

## 2014-06-27 ENCOUNTER — Inpatient Hospital Stay (HOSPITAL_COMMUNITY): Payer: Medicare Other | Admitting: Physical Therapy

## 2014-06-27 ENCOUNTER — Inpatient Hospital Stay (HOSPITAL_COMMUNITY): Payer: Medicare Other | Admitting: Speech Pathology

## 2014-06-27 DIAGNOSIS — I6300 Cerebral infarction due to thrombosis of unspecified precerebral artery: Secondary | ICD-10-CM

## 2014-06-27 LAB — CBC WITH DIFFERENTIAL/PLATELET
BASOS PCT: 1 % (ref 0–1)
Basophils Absolute: 0.1 10*3/uL (ref 0.0–0.1)
EOS PCT: 4 % (ref 0–5)
Eosinophils Absolute: 0.3 10*3/uL (ref 0.0–0.7)
HCT: 39.5 % (ref 39.0–52.0)
HEMOGLOBIN: 12.8 g/dL — AB (ref 13.0–17.0)
LYMPHS ABS: 1.8 10*3/uL (ref 0.7–4.0)
Lymphocytes Relative: 23 % (ref 12–46)
MCH: 27.8 pg (ref 26.0–34.0)
MCHC: 32.4 g/dL (ref 30.0–36.0)
MCV: 85.7 fL (ref 78.0–100.0)
MONO ABS: 0.9 10*3/uL (ref 0.1–1.0)
MONOS PCT: 11 % (ref 3–12)
NEUTROS PCT: 61 % (ref 43–77)
Neutro Abs: 4.8 10*3/uL (ref 1.7–7.7)
Platelets: 188 10*3/uL (ref 150–400)
RBC: 4.61 MIL/uL (ref 4.22–5.81)
RDW: 14.3 % (ref 11.5–15.5)
WBC: 7.9 10*3/uL (ref 4.0–10.5)

## 2014-06-27 LAB — GLUCOSE, CAPILLARY
GLUCOSE-CAPILLARY: 117 mg/dL — AB (ref 70–99)
GLUCOSE-CAPILLARY: 119 mg/dL — AB (ref 70–99)
GLUCOSE-CAPILLARY: 161 mg/dL — AB (ref 70–99)
Glucose-Capillary: 128 mg/dL — ABNORMAL HIGH (ref 70–99)

## 2014-06-27 LAB — COMPREHENSIVE METABOLIC PANEL
ALBUMIN: 3 g/dL — AB (ref 3.5–5.2)
ALK PHOS: 53 U/L (ref 39–117)
ALT: 22 U/L (ref 0–53)
ANION GAP: 13 (ref 5–15)
AST: 29 U/L (ref 0–37)
BUN: 19 mg/dL (ref 6–23)
CALCIUM: 9.1 mg/dL (ref 8.4–10.5)
CO2: 23 mEq/L (ref 19–32)
CREATININE: 1.11 mg/dL (ref 0.50–1.35)
Chloride: 103 mEq/L (ref 96–112)
GFR calc non Af Amer: 72 mL/min — ABNORMAL LOW (ref >90)
GFR, EST AFRICAN AMERICAN: 83 mL/min — AB (ref >90)
GLUCOSE: 134 mg/dL — AB (ref 70–99)
Potassium: 4.6 mEq/L (ref 3.7–5.3)
Sodium: 139 mEq/L (ref 137–147)
Total Bilirubin: 0.3 mg/dL (ref 0.3–1.2)
Total Protein: 7.1 g/dL (ref 6.0–8.3)

## 2014-06-27 NOTE — Evaluation (Signed)
Physical Therapy Assessment and Plan  Patient Details  Name: [name] Williams MRN: [id number] Date of Birth: 06-29-1956  PT Diagnosis: Abnormality of gait, Coordination disorder, Hemiplegia non-dominant and Muscle weakness, Impaired balance Rehab Potential: Good ELOS: 7-9 days   Today's Date: 06/27/2014 PT Individual Time: 1100-1200 PT Individual Time Calculation (min): 60 min    Problem List:  Patient Active Problem List   Diagnosis Date Noted  . CVA (cerebral infarction) 06/26/2014  . 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 Sensory deficit, left 06/26/2014  . 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 Diplopia   . Acute CVA (cerebrovascular accident) 06/23/2014  . Stroke-like symptom 06/23/2014  . Preventative health care 08/29/2013  . History of CVA (cerebrovascular accident) 12/01/2009  . HLD (hyperlipidemia) 04/08/2008  . DYSLIPIDEMIA 04/08/2008  . TOBACCO ABUSE 04/08/2008    Past Medical History:  Past Medical History  Diagnosis Date  . Diabetes mellitus   . Hypertension   . History of stroke    Past Surgical History: History reviewed. No pertinent past surgical history.  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 Patient lives with his sister and was independent prior to admission. Admitted 06/23/2014 with increased left-sided weakness, slurred speech and blurred vision . 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 Neurology follow-up suspect posterior circulation infarcts secondary to small vessel disease.echocardiogram with ejection fraction of 91% grade 1 diastolic dysfunction. Carotid Dopplers with left 40-59% ICA  stenosis. Urine drug screen upon admission positive marijuana. Patient did not receive TPA. Neurology services consulted presently maintained on aspirin 325 mg daily for CVA prophylaxis as well as subcutaneous heparin for DVT prophylaxis. Patient is tolerating a regular consistency diet. Patient transferred to CIR on 06/26/2014 .   Patient currently requires mod with mobility secondary to muscle weakness, decreased cardiorespiratoy endurance and decreased coordination and decreased motor planning. Prior to hospitalization, patient was independent  with mobility and lived with Family (Sister) in a House home.  Home access is 5Stairs to enter.   Patient will benefit from skilled PT intervention to maximize safe functional mobility, minimize fall risk and decrease caregiver burden for planned discharge home with 24 hour supervision.  Anticipate patient will benefit from follow up [place] at discharge.  PT - End of Session Activity Tolerance: Decreased this session;Tolerates 30+ min activity with multiple rests Endurance Deficit: Yes Endurance Deficit Description: pt requesting seated rest following second trial of ambulation PT Assessment Rehab Potential (ACUTE/IP ONLY): Good Barriers to Discharge: Inaccessible home environment;Decreased caregiver support (need to verify with family if other sister is available 24/7) PT Patient demonstrates impairments in the following area(s): Balance;Endurance;Motor;Nutrition;Safety PT Transfers Functional Problem(s): Bed Mobility;Bed to Chair;Car;Furniture PT Locomotion Functional Problem(s): Ambulation;Wheelchair Mobility;Stairs PT Plan PT Intensity: Minimum of 1-2 x/day ,45 to 90 minutes PT Frequency: 5 out of 7 days PT Duration Estimated Length of Stay: 7-9 days PT Treatment/Interventions: Ambulation/gait training;Balance/vestibular training;Community reintegration;Discharge planning;Disease management/prevention;DME/adaptive equipment instruction;Functional  mobility training;Neuromuscular re-education;Patient/family education;Psychosocial support;Stair training;Therapeutic Activities;Therapeutic Exercise;UE/LE Strength taining/ROM;UE/LE Coordination activities;Visual/perceptual remediation/compensation;Wheelchair propulsion/positioning;Cognitive remediation/compensation PT Transfers Anticipated Outcome(s): supervision PT Locomotion Anticipated Outcome(s): supervision PT Recommendation Follow Up Recommendations: Home health PT Patient destination: Home Equipment Recommended: To be determined Equipment Details: anticipate pt will need RW  Skilled Therapeutic Intervention Skilled therapeutic intervention initiated  after completion of evaluation. Discussed with patient falls risk, safety within room, and focus of therapy during stay. Discussed possible LOS, goals, and f/u therapy. Initiated gait training x 80 ft with use of RW at min A level overall. Pt with decreased balance and demonstrates lateral trunk lean to left as well as weight shifted posteriorly in standing. Pt performed stand pivot transfers without RW and mod A. Pt left seated in recliner with all needs within reach.   PT Evaluation Precautions/Restrictions Precautions Precautions: Fall Restrictions Weight Bearing Restrictions: No General Chart Reviewed: Yes Family/Caregiver Present: No Vital Signs  Pain Pain Assessment Pain Assessment: No/denies pain Home Living/Prior Functioning Home Living Available Help at Discharge: Family;Available 24 hours/day Type of Home: House Home Access: Stairs to enter [employer] of Steps: 5 Entrance Stairs-Rails: Can reach both Home Layout: One level Additional Comments: Pt reports sister he lives with works during day but other sister could provide assist 24/7  Lives With: Family (Sister) Prior Function Level of Independence: Independent with gait;Independent with transfers;Independent with basic ADLs  Able to Take Stairs?:  Yes Driving: No Vocation: Unemployed Leisure: Hobbies-yes (Comment) Comments: watch TV Vision/Perception  Vision - Assessment Eye Alignment: Impaired (comment) (left eye deviates laterally at times with midline gaze) Ocular Range of Motion: Restricted on the right;Restricted on the left Alignment/Gaze Preference: Other (comment) (left eye sits laterally at rest) Tracking/Visual Pursuits: Right eye does not track laterally;Right eye does not track medially;Decreased smoothness of eye movement to LEFT superior field;Decreased smoothness of eye movement to LEFT inferior field;Other (comment) (right eye with very little tracking to moving object in any direction.  left eye was able to track in all directions but with limited AROM expecailly leterall.  Also with end range nystagmus when attempting to hold laterally.) Saccades: Impaired - to be further tested in functional context Convergence: Impaired (comment) Diplopia Assessment: Other (comment) (Pt did not report or demonstrate diplopia during testing but will continue to further examine during function.)  Cognition Overall Cognitive Status: Impaired/Different from baseline Arousal/Alertness: Awake/alert Orientation Level: Oriented X4 Attention: Sustained Sustained Attention: Appears intact Memory: Appears intact Awareness: Impaired Awareness Impairment: Emergent impairment Safety/Judgment: Appears intact Comments: Pt with decreased emergent awareness noted during bathing as pt was dripping water from the washcloth onto the floor, but made no attempts to correct it even though he was instructed about it earlier when bathing other parts. Sensation Sensation Light Touch: Appears Intact Stereognosis: Appears Intact Hot/Cold: Appears Intact Proprioception: Appears Intact Additional Comments: Pt reports hands feeling different but intact light touch and stereognosis during testing.  Coordination Gross Motor Movements are Fluid and  Coordinated: No Fine Motor Movements are Fluid and Coordinated: No Coordination and Movement Description: Pt with decreased speed with finger to nose tesing bilaterally.  He demonstrates some decreased FM coordination in the left hand from previous CVA with atrophy noted in the transverse arch of the hand and clawing position present with digit extension.  Motor  Motor Motor: Abnormal postural alignment and control Motor - Skilled Clinical Observations: Pt with increased lean to the left in sitting.  Mobility Bed Mobility Bed Mobility: Supine to Sit;Sit to Supine Supine to Sit: 5: Supervision Supine to Sit Details: Verbal cues for technique Sit to Supine: 5: Supervision Sit to Supine - Details: Verbal cues for technique Transfers Transfers: Yes Stand Pivot Transfers: 3: Mod assist Stand Pivot Transfer Details: Manual facilitation for weight shifting;Verbal cues for sequencing;Verbal cues for technique;Verbal cues for precautions/safety Locomotion  Ambulation Ambulation: Yes Ambulation/Gait Assistance:  3: Mod assist Ambulation Distance (Feet): 90 Feet Assistive device: 1 person hand held assist Gait Gait: Yes Gait Pattern: Impaired Gait Pattern: Step-through pattern;Narrow base of support;Scissoring;Trunk flexed;Decreased trunk rotation;Lateral trunk lean to left;Decreased stance time - left;Decreased stride length;Decreased dorsiflexion - right;Decreased dorsiflexion - left Gait velocity: Decreased Stairs / Additional Locomotion Stairs: Yes Stairs Assistance: 4: Min assist Stairs Assistance Details: Verbal cues for technique;Verbal cues for sequencing;Verbal cues for precautions/safety Stair Management Technique: Two rails;Step to pattern;Forwards Number of Stairs: 5 Height of Stairs: 6 Wheelchair Mobility Wheelchair Mobility: Yes Wheelchair Assistance: 3: Mod assist;4: Advertising account executive Details: Verbal cues for technique;Verbal cues for sequencing;Verbal cues  for Information systems manager: Both upper extremities Wheelchair Parts Management: Needs assistance Distance: 150  Trunk/Postural Assessment  Cervical Assessment Cervical Assessment: Within Functional Limits Thoracic Assessment Thoracic Assessment: Within Functional Limits Lumbar Assessment Lumbar Assessment: Within Functional Limits Postural Control Postural Control: Deficits on evaluation Protective Responses: impaired/absent Postural Limitations: Pt with increased lean to the left in sititng when performing bathing tasks as well as increased posterior bias when standing to perform peri washing.  Balance Balance Balance Assessed: Yes Standardized Balance Assessment Standardized Balance Assessment: Berg Balance Test Berg Balance Test Sit to Stand: Needs minimal aid to stand or to stabilize Standing Unsupported: Unable to stand 30 seconds unassisted Sitting with Back Unsupported but Feet Supported on Floor or Stool: Able to sit safely and securely 2 minutes Stand to Sit: Uses backs of legs against chair to control descent Transfers: Needs one person to assist Standing Unsupported with Eyes Closed: Needs help to keep from falling Standing Ubsupported with Feet Together: Needs help to attain position and unable to hold for 15 seconds From Standing, Reach Forward with Outstretched Arm: Reaches forward but needs supervision From Standing Position, Pick up Object from Floor: Unable to try/needs assist to keep balance From Standing Position, Turn to Look Behind Over each Shoulder: Needs supervision when turning Turn 360 Degrees: Needs assistance while turning Standing Unsupported, Alternately Place Feet on Step/Stool: Needs assistance to keep from falling or unable to try Standing Unsupported, One Foot in Front: Loses balance while stepping or standing Standing on One Leg: Unable to try or needs assist to prevent fall Total Score: 10 /56 Extremity Assessment  RUE  Assessment RUE Assessment: Within Functional Limits (Grossly 4/5 strength throughout with coodination WFLs for ADL tasks) LUE Assessment LUE Assessment: Exceptions to [facility] LUE Strength LUE Overall Strength Comments: Pt with history of mild hemiparesis in the LUE, with the hand being more affected than the arm.  AROM for shoulder and elbow WFLs with strength 3+/5 at the shoulder and 4/5 at the elbow,  Gross grasp and release present with grip strength at 3+/5.  Intrinsic hand atrophy noted at the transverse arch with pt collapsing at the MPs with digit extension.  Decreased efficiency with FM tasks such as opening bottles of tying his gown. RLE Assessment RLE Assessment: Within Functional Limits LLE Assessment LLE Assessment: Exceptions to [facility] LLE Strength LLE Overall Strength: Deficits LLE Overall Strength Comments: grossly 4+/5 throughout  FIM:  FIM - Bed/Chair Transfer Bed/Chair Transfer: 3: Chair or W/C > Bed: Mod A (lift or lower assist);3: Bed > Chair or W/C: Mod A (lift or lower assist);5: Sit > Supine: Supervision (verbal cues/safety issues);5: Supine > Sit: Supervision (verbal cues/safety issues) FIM - Locomotion: Wheelchair Distance: 150 Locomotion: Wheelchair: 3: Travels 150 ft or more: maneuvers on rugs and over door sills with moderate assistance  (Pt: 50 - 74%)  FIM - Locomotion: Ambulation Locomotion: Ambulation Assistive Devices: Other (comment) (no AD) Ambulation/Gait Assistance: 3: Mod assist Locomotion: Ambulation: 2: Travels 50 - 149 ft with moderate assistance (Pt: 50 - 74%) FIM - Locomotion: Stairs Locomotion: Scientist, physiological: Hand rail - 2 Locomotion: Stairs: 2: Up and Down 4 - 11 stairs with minimal assistance (Pt.>75%)   Refer to Care Plan for Long Term Goals  Recommendations for other services: None  Discharge Criteria: Patient will be discharged from PT if patient refuses treatment 3 consecutive times without medical reason, if treatment goals not met,  if there is a change in medical status, if patient makes no progress towards goals or if patient is discharged from hospital.  The above assessment, treatment plan, treatment alternatives and goals were discussed and mutually agreed upon: by patient  Laretta Alstrom 06/27/2014, 12:18 PM

## 2014-06-27 NOTE — Evaluation (Signed)
Occupational Therapy Assessment and Plan  Patient Details  Name: [name] Williams MRN: [id number] Date of Birth: 06-Aug-1956  OT Diagnosis: abnormal posture, cognitive deficits, hemiplegia affecting non-dominant side and muscle weakness (generalized) Rehab Potential: Rehab Potential (ACUTE ONLY): Excellent ELOS: 9 days   Today's Date: 06/27/2014 OT Individual Time: 1000-1100 OT Individual Time Calculation (min): 60 min     Problem List:  Patient Active Problem List   Diagnosis Date Noted  . CVA (cerebral infarction) 06/26/2014  . 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 Sensory deficit, left 06/26/2014  . 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 Diplopia   . Acute CVA (cerebrovascular accident) 06/23/2014  . Stroke-like symptom 06/23/2014  . Preventative health care 08/29/2013  . History of CVA (cerebrovascular accident) 12/01/2009  . HLD (hyperlipidemia) 04/08/2008  . DYSLIPIDEMIA 04/08/2008  . TOBACCO ABUSE 04/08/2008    Past Medical History:  Past Medical History  Diagnosis Date  . Diabetes mellitus   . Hypertension   . History of stroke    Past Surgical History: History reviewed. No pertinent past surgical history.  Assessment & Plan Clinical Impression: Patient is a 58 y.o. year old male with recent admission to the hospital on  With 06/23/2014 with increased left-sided weakness, slurred speech and blurred vision . 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 Neurology follow-up suspect posterior circulation infarcts secondary to small vessel disease.echocardiogram with ejection fraction of 62% grade 1 diastolic dysfunction. Carotid Dopplers with left 40-59% ICA stenosis .  Patient transferred to CIR on 06/26/2014 .  Patient currently requires mod with basic self-care skills secondary to muscle weakness, impaired timing and sequencing, unbalanced muscle activation and decreased coordination and decreased standing balance  and decreased postural control.  Prior to hospitalization, patient could complete ADLs with independent .  Patient will benefit from skilled intervention to decrease level of assist with basic self-care skills prior to discharge home with care partner.  Anticipate patient will require 24 hour supervision and follow up home health.  OT - End of Session Activity Tolerance: Tolerates 30+ min activity with multiple rests Endurance Deficit: Yes Endurance Deficit Description: pt requesting seated rest following second trial of ambulation OT Assessment Rehab Potential (ACUTE ONLY): Excellent OT Patient demonstrates impairments in the following area(s): Balance;Cognition;Endurance;Motor;Vision OT Basic ADL's Functional Problem(s): Grooming;Bathing;Dressing;Toileting;Eating OT Advanced ADL's Functional Problem(s): Simple Meal Preparation OT Transfers Functional Problem(s): Toilet;Tub/Shower OT Additional Impairment(s): Fuctional Use of Upper Extremity OT Plan OT Intensity: Minimum of 1-2 x/day, 45 to 90 minutes OT Frequency: 5 out of 7 days OT Duration/Estimated Length of Stay: 9 days OT Treatment/Interventions: Balance/vestibular training;Cognitive remediation/compensation;DME/adaptive equipment instruction;Community reintegration;Discharge planning;Functional mobility training;Psychosocial support;Therapeutic Activities;Visual/perceptual remediation/compensation;UE/LE Coordination activities;Patient/family education;UE/LE Strength taining/ROM;Therapeutic Exercise;Self Care/advanced ADL retraining;Neuromuscular re-education OT Self Feeding Anticipated Outcome(s): independent OT Basic Self-Care Anticipated Outcome(s): supervision OT Toileting Anticipated Outcome(s): supervision OT Bathroom Transfers Anticipated Outcome(s): supervision OT Recommendation Patient destination: Home Follow Up Recommendations: Home health OT Equipment Recommended: 3 in 1 bedside comode;Tub/shower seat   OT  Evaluation Precautions/Restrictions  Precautions Precautions: Fall Restrictions Weight Bearing Restrictions: No  Pain Pain Assessment Pain Assessment: No/denies pain Home Living/Prior Functioning Home Living Available Help at Discharge: Family, Available 24 hours/day Type of Home: House Home Access: Stairs to enter [employer] of Steps: 5 Entrance Stairs-Rails: Can reach both Home Layout: One level Additional Comments: Pt reports sister he lives with works during day but other sister could provide assist 24/7  Lives With: Family IADL History Homemaking Responsibilities: No Current License:  No Occupation: On disability Prior Function Level of Independence: Independent with gait, Independent with transfers, Independent with basic ADLs  Able to Take Stairs?: Yes Driving: No Vocation: On disability Leisure: Hobbies-yes (Comment) Comments: watch TV ADL  See FIM scale for details  Vision/Perception  Vision- History Baseline Vision/History: No visual deficits Patient Visual Report: Blurring of vision Vision- Assessment Vision Assessment?: Yes Eye Alignment: Impaired (comment) (left eye deviates laterally at times with midline gaze) Ocular Range of Motion: Restricted on the right;Restricted on the left Alignment/Gaze Preference: Other (comment) (left eye sits laterally at rest) Tracking/Visual Pursuits: Right eye does not track laterally;Right eye does not track medially;Decreased smoothness of eye movement to LEFT superior field;Decreased smoothness of eye movement to LEFT inferior field;Other (comment) (right eye with very little tracking to moving object in any direction.  left eye was able to track in all directions but with limited AROM expecailly leterall.  Also with end range nystagmus when attempting to hold laterally.) Saccades: Impaired - to be further tested in functional context Convergence: Impaired (comment) Visual Fields: No apparent deficits (Will  continue to further assess in function.) Diplopia Assessment: Other (comment) (Pt did not report or demonstrate diplopia during testing but will continue to further examine during function.)  Cognition Overall Cognitive Status: Impaired/Different from baseline Arousal/Alertness: Awake/alert Orientation Level: Oriented X4 Attention: Sustained Sustained Attention: Appears intact Memory: Appears intact Awareness: Impaired Awareness Impairment: Emergent impairment Safety/Judgment: Appears intact Comments: Pt with decreased emergent awareness noted during bathing as pt was dripping water from the washcloth onto the floor, but made no attempts to correct it even though he was instructed about it earlier when bathing other parts. Sensation Sensation Light Touch: Appears Intact Stereognosis: Appears Intact Hot/Cold: Appears Intact Proprioception: Appears Intact Additional Comments: Pt reports hands feeling different but intact light touch and stereognosis during testing.  Coordination Gross Motor Movements are Fluid and Coordinated: No Fine Motor Movements are Fluid and Coordinated: No Coordination and Movement Description: Pt with decreased speed with finger to nose tesing bilaterally.  He demonstrates some decreased FM coordination in the left hand from previous CVA with atrophy noted in the transverse arch of the hand and clawing position present with digit extension.  Heel Shin Test: mildly impaired LLE Motor  Motor Motor: Abnormal postural alignment and control Motor - Skilled Clinical Observations: Pt with increased lean to the left in sitting. Mobility  Bed Mobility Bed Mobility: Supine to Sit;Sit to Supine Supine to Sit: 5: Supervision Supine to Sit Details: Verbal cues for technique Sit to Supine: 5: Supervision Sit to Supine - Details: Verbal cues for technique Transfers Transfers: Sit to Stand;Stand to Sit Sit to Stand: 4: Min assist;From chair/3-in-1;From bed;With upper  extremity assist Sit to Stand Details: Manual facilitation for weight shifting Stand to Sit: 4: Min assist;With upper extremity assist Stand to Sit Details (indicate cue type and reason): Manual facilitation for weight shifting  Trunk/Postural Assessment  Cervical Assessment Cervical Assessment: Within Functional Limits Thoracic Assessment Thoracic Assessment: Within Functional Limits Lumbar Assessment Lumbar Assessment: Within Functional Limits Postural Control Postural Control: Deficits on evaluation Protective Responses: impaired/absent Postural Limitations: Pt with increased lean to the left in sititng when performing bathing tasks as well as increased posterior bias when standing to perform peri washing.  Balance Balance Balance Assessed: Yes Standardized Balance Assessment Standardized Balance Assessment: Berg Balance Test Berg Balance Test Sit to Stand: Needs minimal aid to stand or to stabilize Standing Unsupported: Unable to stand 30 seconds unassisted Sitting with Back Unsupported  but Feet Supported on Floor or Stool: Able to sit safely and securely 2 minutes Stand to Sit: Uses backs of legs against chair to control descent Transfers: Needs one person to assist Standing Unsupported with Eyes Closed: Needs help to keep from falling Standing Ubsupported with Feet Together: Needs help to attain position and unable to hold for 15 seconds From Standing, Reach Forward with Outstretched Arm: Reaches forward but needs supervision From Standing Position, Pick up Object from Floor: Unable to try/needs assist to keep balance From Standing Position, Turn to Look Behind Over each Shoulder: Needs supervision when turning Turn 360 Degrees: Needs assistance while turning Standing Unsupported, Alternately Place Feet on Step/Stool: Needs assistance to keep from falling or unable to try Standing Unsupported, One Foot in Front: Loses balance while stepping or standing Standing on One Leg:  Unable to try or needs assist to prevent fall Total Score: 10 Dynamic Sitting Balance Sitting balance - Comments: posterior and lateral lean to left Static Standing Balance Static Standing - Balance Support: Left upper extremity supported Static Standing - Level of Assistance: 4: Min assist Dynamic Standing Balance Dynamic Standing - Balance Support: No upper extremity supported Dynamic Standing - Level of Assistance: 3: Mod assist Extremity/Trunk Assessment RUE Assessment RUE Assessment: Within Functional Limits (Grossly 4/5 strength throughout with coodination WFLs for ADL tasks) LUE Assessment LUE Assessment: Exceptions to [facility] LUE Strength LUE Overall Strength Comments: Pt with history of mild hemiparesis in the LUE, with the hand being more affected than the arm.  AROM for shoulder and elbow WFLs with strength 3+/5 at the shoulder and 4/5 at the elbow,  Gross grasp and release present with grip strength at 3+/5.  Intrinsic hand atrophy noted at the transverse arch with pt collapsing at the MPs with digit extension.  Decreased efficiency with FM tasks such as opening bottles of tying his gown.  FIM:  FIM - Eating Eating Activity: 5: Set-up assist for apply device (including dentures) FIM - Grooming Grooming Steps: Wash, rinse, dry face;Wash, rinse, dry hands;Oral care, brush teeth, clean dentures Grooming: 4: Patient completes 3 of 4 or 4 of 5 steps FIM - Bathing Bathing Steps Patient Completed: Chest;Right Arm;Left Arm;Abdomen;Front perineal area;Buttocks;Right upper leg;Left lower leg (including foot);Right lower leg (including foot);Left upper leg Bathing: 4: Min-Patient completes 8-9 [id number] parts or 75+ percent FIM - Upper Body Dressing/Undressing Upper body dressing/undressing steps patient completed: Thread/unthread left bra strap;Thread/unthread left sleeve of pullover shirt/dress Upper body dressing/undressing: 0: Activity did not occur FIM - Lower Body  Dressing/Undressing Lower body dressing/undressing steps patient completed: Don/Doff right sock;Don/Doff left sock Lower body dressing/undressing: 4: Min-Patient completed 75 plus % of tasks FIM - Bed/Chair Transfer Bed/Chair Transfer: 5: Supine > Sit: Supervision (verbal cues/safety issues);3: Bed > Chair or W/C: Mod A (lift or lower assist) FIM - TAir cabin crewTransfers Assistive Devices: Elevated toilet seat;Grab bars Toilet Transfers: 3-From toilet/BSC: Mod A (lift or lower assist);3-To toilet/BSC: Mod A (lift or lower assist) FIM - Tub/Shower Transfers Tub/shower Transfers: 0-Activity did not occur or was simulated   Refer to Care Plan for Long Term Goals  Recommendations for other services: None  Discharge Criteria: Patient will be discharged from OT if patient refuses treatment 3 consecutive times without medical reason, if treatment goals not met, if there is a change in medical status, if patient makes no progress towards goals or if patient is discharged from hospital.  The above assessment, treatment plan, treatment alternatives and goals were discussed and mutually  agreed upon: by patient  Began education on selfcare re-training sit to stand at the sink this session.  Pt overall min assist for bathing but did not have clothing on eval.  He demonstrates significant visual deficits especially with reference to occular ROM and tracking.  Will continue to further assess in function.   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  Decreased FM coordination noted with attempted use but was able to integrate at a diminished level with basic selfcare tasks.   Chantalle Defilippo,Taedyn OTR/L 06/27/2014, 1:32 PM

## 2014-06-27 NOTE — Progress Notes (Signed)
Patient information reviewed and entered into eRehab system by Waunetta Riggle, RN, CRRN, PPS Coordinator.  Information including medical coding and functional independence measure will be reviewed and updated through discharge.     Per nursing patient was given "Data Collection Information Summary for Patients in Inpatient Rehabilitation Facilities with attached "Privacy Act Statement-Health Care Records" upon admission.  

## 2014-06-27 NOTE — IPOC Note (Signed)
Overall Plan of Care [facility](IPOC) Patient Details Name: Cleotilde NeerJames Fabrizio MRN: [id number] DOB: 29-Sep-1955  Admitting Diagnosis: Post circulation  brainstem CVA   Hospital Problems: Active Problems:   CVA (cerebral infarction)   INO (internuclear ophthalmoplegia)   Sensory deficit, left   Brainstem infarction     Functional Problem List: Nursing Bladder, Endurance, Medication Management, Motor, Safety, Skin Integrity  PT Balance, Endurance, Motor, Nutrition, Safety  OT Balance, Cognition, Endurance, Motor, Vision  SLP    TR         Basic ADL's: OT Grooming, Bathing, Dressing, Toileting, Eating     Advanced  ADL's: OT Simple Meal Preparation     Transfers: PT Bed Mobility, Bed to Chair, Car, Occupational psychologisturniture  OT Toilet, Research scientist (life sciences)Tub/Shower     Locomotion: PT Ambulation, Psychologist, prison and probation servicesWheelchair Mobility, Stairs     Additional Impairments: OT Fuctional Use of Upper Extremity  SLP        TR      Anticipated Outcomes Item Anticipated Outcome  Self Feeding independent  Swallowing      Basic self-care  supervision  Toileting  supervision   Bathroom Transfers supervision  Bowel/Bladder  Continent of bowel and bladder  Transfers  supervision  Locomotion  supervision  Communication     Cognition     Pain  </=3  Safety/Judgment  No falls with injury   Therapy Plan: PT Intensity: Minimum of 1-2 x/day ,45 to 90 minutes PT Frequency: 5 out of 7 days PT Duration Estimated Length of Stay: 7-9 days OT Intensity: Minimum of 1-2 x/day, 45 to 90 minutes OT Frequency: 5 out of 7 days OT Duration/Estimated Length of Stay: 9 days         Team Interventions: Nursing Interventions Patient/Family Education, Bladder Management, Medication Management, Skin Care/Wound Management, Psychosocial Support  PT interventions Ambulation/gait training, Warden/rangerBalance/vestibular training, Community reintegration, Discharge planning, Disease management/prevention, Fish farm managerDME/adaptive equipment instruction, Functional mobility  training, Neuromuscular re-education, Patient/family education, Psychosocial support, Stair training, Therapeutic Activities, Therapeutic Exercise, UE/LE Strength taining/ROM, UE/LE Coordination activities, Visual/perceptual remediation/compensation, Wheelchair propulsion/positioning, Cognitive remediation/compensation  OT Interventions Warden/rangerBalance/vestibular training, Cognitive remediation/compensation, DME/adaptive equipment instruction, Community reintegration, Discharge planning, Functional mobility training, Psychosocial support, Therapeutic Activities, Visual/perceptual remediation/compensation, UE/LE Coordination activities, Patient/family education, UE/LE Strength taining/ROM, Therapeutic Exercise, Self Care/advanced ADL retraining, Neuromuscular re-education  SLP Interventions    TR Interventions    SW/CM Interventions Discharge Planning, Psychosocial Support, Patient/Family Education    Team Discharge Planning: Destination: PT-Home ,OT- Home , SLP-  Projected Follow-up: PT-Home health PT, OT-  Home health OT, SLP-  Projected Equipment Needs: PT-To be determined, OT- 3 in 1 bedside comode, Tub/shower seat, SLP-  Equipment Details: PT-anticipate pt will need RW, OT-  Patient/family involved in discharge planning: PT- Patient,  OT-Patient, SLP-   MD ELOS: 7-10d Medical Rehab Prognosis:  Fair Assessment: [age] y.o.right handed male with history of schizophrenia, hypertension as well as diabetes mellitus and peripheral neuropathy,left pontine infarct March 2011 receiving inpatient rehabilitation services and was discharged to home ambulating distance supervision maintained on aspirin therapy. Patient lives with his sister and was independent prior to admission. Admitted 06/23/2014 with increased left-sided weakness, slurred speech and blurred vision . 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 Neurology follow-up suspect posterior circulation  infarcts secondary to small vessel disease.echocardiogram with ejection fraction of 50% grade 1 diastolic dysfunction. Carotid Dopplers with left 40-59% ICA stenosis. Urine drug screen upon admission positive marijuana. Patient did not receive TPA   Now requiring 24/7  Rehab RN,MD, as well as CIR level PT, OT and SLP.  Treatment team will focus on ADLs and mobility with goals set at Supervision   See Team Conference Notes for weekly updates to the plan of care

## 2014-06-27 NOTE — Evaluation (Signed)
Speech Language Pathology Assessment and Plan  Patient Details  Name: [name] Williams MRN: [id number] Date of Birth: [birth year]  SLP Diagnosis: Cognitive Impairments;Dysarthria  Rehab Potential: Good ELOS: 7-9 days    Today's Date: 06/27/2014 SLP Individual Time: 1400-1500 SLP Individual Time Calculation (min): 60 min   Problem List:  Patient Active Problem List   Diagnosis Date Noted  . CVA (cerebral infarction) 06/26/2014  . 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 Sensory deficit, left 06/26/2014  . 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 Diplopia   . Acute CVA (cerebrovascular accident) 06/23/2014  . Stroke-like symptom 06/23/2014  . Preventative health care 08/29/2013  . History of CVA (cerebrovascular accident) 12/01/2009  . HLD (hyperlipidemia) 04/08/2008  . DYSLIPIDEMIA 04/08/2008  . TOBACCO ABUSE 04/08/2008   Past Medical History:  Past Medical History  Diagnosis Date  . Diabetes mellitus   . Hypertension   . History of stroke    Past Surgical History: History reviewed. No pertinent past surgical history.  Assessment / Plan / Recommendation Clinical Impression [name] Williams is a 58 y.o.right handed male with history of schizophrenia, hypertension as well as diabetes mellitus and peripheral neuropathy,left pontine infarct March 2011 receiving inpatient rehabilitation services and was discharged to home ambulating distance supervision maintained on aspirin therapy. Patient lives with his sister and was independent prior to admission. Admitted 06/23/2014 with increased left-sided weakness, slurred speech and blurred vision . 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 Neurology follow-up suspect posterior circulation infarcts secondary to small vessel disease.echocardiogram with ejection fraction of 36% grade 1 diastolic dysfunction. Carotid Dopplers with left 40-59% ICA stenosis. Urine drug screen upon admission  positive marijuana. Patient did not receive TPA. Neurology services consulted presently maintained on aspirin 325 mg daily for CVA prophylaxis as well as subcutaneous heparin for DVT prophylaxis. Patient is tolerating a regular consistency diet. Physical and occupational therapy evaluations completed 06/24/2014 with recommendations of physical medicine rehabilitation consult. Patient was admitted for comprehensive rehab program. Orders received; bedside swallow and cognitive-linguistic evaluations completed. Swallowing function appears [facility] for tasks assessed, however, patient demonstrates minimal oral residue with regular textures and multiple swallows with thin liquids, suspect due to decreased portion control of PO intake. Patient completed the [facility] Cognitive Assessment [facility]) and demonstrated a below normal score with deficits noted in the area of memory and awareness impacting safety upon discharge. Suspect baseline cognitive deficits, but family not present to confirm at this time. Patient also demonstrates reduced intelligibility but reports speech is at baseline, will confirm with family members when available. As a result, skilled SLP services are warranted to address deficits in order to maximize functional independence and reduce overall burned of care prior to discharge home with sister.    Skilled Therapeutic Interventions          Patient administered a cognitive-linguistic and bedside swallow evaluations. Please see above for details. Patient educated in regards to current cognitive function and goals of skilled SLP intervention. Patient verbalized understanding.   SLP Assessment  Patient will need skilled [place] [facility] during CIR admission    Recommendations  Oral Care Recommendations: Oral care BID Patient destination: Home Follow up Recommendations: None Equipment Recommended: None recommended by SLP    SLP Frequency 3 out of 7 days   SLP  Treatment/Interventions Cueing hierarchy;Cognitive remediation/compensation;Environmental controls;Internal/external aids;Patient/family education;Therapeutic Activities    Pain Pain Assessment Pain Assessment: No/denies pain Prior Functioning Cognitive/Linguistic Baseline: Information not available Type of Home: House  Lives With: Family (sister) Available Help at Discharge: Family;Available 24 hours/day Education: 12th Grade Vocation: On disability  Short Term Goals: Week 1: SLP Short Term Goal 1 (Week 1): STGs = LTGs  See FIM for current functional status Refer to Care Plan for Long Term Goals  Recommendations for other services: None  Discharge Criteria: Patient will be discharged from SLP if patient refuses treatment 3 consecutive times without medical reason, if treatment goals not met, if there is a change in medical status, if patient makes no progress towards goals or if patient is discharged from hospital.  The above assessment, treatment plan, treatment alternatives and goals were discussed and mutually agreed upon: by patient  [name] Williams 06/27/2014, 5:47 PM

## 2014-06-27 NOTE — Plan of Care (Signed)
Problem: RH BLADDER ELIMINATION Goal: RH STG MANAGE BLADDER WITH ASSISTANCE STG Manage Bladder With Assistance  Outcome: Progressing  Problem: RH SAFETY Goal: RH STG ADHERE TO SAFETY PRECAUTIONS W/ASSISTANCE/DEVICE STG Adhere to Safety Precautions With Assistance/Device.  Outcome: Progressing  Problem: RH COGNITION-NURSING Goal: RH STG USES MEMORY AIDS/STRATEGIES W/ASSIST TO PROBLEM SOLVE STG Uses Memory Aids/Strategies With Assistance to Problem Solve.  Outcome: Progressing

## 2014-06-27 NOTE — Progress Notes (Addendum)
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 Patient lives with his sister and was independent prior to admission. Admitted 06/23/2014 with increased left-sided weakness, slurred speech and blurred vision . 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 Neurology follow-up suspect posterior circulation infarcts secondary to small vessel disease.echocardiogram with ejection fraction of 22% grade 1 diastolic dysfunction. Carotid Dopplers with left 40-59% ICA stenosis. Urine drug screen upon admission positive marijuana. Patient did not receive TPA. Neurology services consulted presently maintained on aspirin 325 mg daily for CVA prophylaxis as well as subcutaneous heparin for DVT prophylaxis. Patient is tolerating a regular consistency diet   Subjective/Complaints: No issues overnite per pt  Review of Systems - limited verbal output Objective: Vital Signs: Blood pressure 136/68, pulse 67, temperature 98 F (36.7 C), temperature source Oral, resp. rate 18, height _0  (1.778 m), weight 72.031 kg (158 lb 12.8 oz), SpO2 100 %. No results found. Results for orders placed or performed during the hospital encounter of 06/26/14 (from the past 72 hour(s))  CBC     Status: Abnormal   Collection Time: 06/26/14  3:25 PM  Result Value Ref Range   WBC 10.0 4.0 - 10.5 K/uL   RBC 4.62 4.22 - 5.81 MIL/uL   Hemoglobin 12.9 (L) 13.0 - 17.0 g/dL   HCT 40.8 39.0 - 52.0 %   MCV 88.3 78.0 - 100.0 fL   MCH 27.9 26.0 - 34.0 pg   MCHC 31.6 30.0 - 36.0 g/dL   RDW 14.5 11.5 - 15.5 %   Platelets 174 150 - 400 K/uL  Creatinine, serum     Status: Abnormal   Collection Time: 06/26/14  3:25 PM  Result Value Ref Range   Creatinine,  Ser 1.26 0.50 - 1.35 mg/dL   GFR calc non Af Amer 62 (L) >90 mL/min   GFR calc Af Amer 72 (L) >90 mL/min    Comment: (NOTE) The eGFR has been calculated using the CKD EPI equation. This calculation has not been validated in all clinical situations. eGFR's persistently <90 mL/min signify possible Chronic Kidney Disease.   Glucose, capillary     Status: Abnormal   Collection Time: 06/26/14  4:22 PM  Result Value Ref Range   Glucose-Capillary 115 (H) 70 - 99 mg/dL  Glucose, capillary     Status: Abnormal   Collection Time: 06/26/14  9:07 PM  Result Value Ref Range   Glucose-Capillary 182 (H) 70 - 99 mg/dL  CBC WITH DIFFERENTIAL     Status: Abnormal   Collection Time: 06/27/14  4:20 AM  Result Value Ref Range   WBC 7.9 4.0 - 10.5 K/uL   RBC 4.61 4.22 - 5.81 MIL/uL   Hemoglobin 12.8 (L) 13.0 - 17.0 g/dL   HCT 39.5 39.0 - 52.0 %   MCV 85.7 78.0 - 100.0 fL   MCH 27.8 26.0 - 34.0 pg   MCHC 32.4 30.0 - 36.0 g/dL   RDW 14.3 11.5 - 15.5 %   Platelets 188 150 - 400 K/uL   Neutrophils Relative % 61 43 - 77 %   Neutro Abs 4.8 1.7 - 7.7 K/uL   Lymphocytes Relative 23 12 - 46 %   Lymphs Abs 1.8 0.7 - 4.0 K/uL   Monocytes Relative 11 3 - 12 %  Monocytes Absolute 0.9 0.1 - 1.0 K/uL   Eosinophils Relative 4 0 - 5 %   Eosinophils Absolute 0.3 0.0 - 0.7 K/uL   Basophils Relative 1 0 - 1 %   Basophils Absolute 0.1 0.0 - 0.1 K/uL  Comprehensive metabolic panel     Status: Abnormal   Collection Time: 06/27/14  4:20 AM  Result Value Ref Range   Sodium 139 137 - 147 mEq/L   Potassium 4.6 3.7 - 5.3 mEq/L   Chloride 103 96 - 112 mEq/L   CO2 23 19 - 32 mEq/L   Glucose, Bld 134 (H) 70 - 99 mg/dL   BUN 19 6 - 23 mg/dL   Creatinine, Ser 1.11 0.50 - 1.35 mg/dL   Calcium 9.1 8.4 - 10.5 mg/dL   Total Protein 7.1 6.0 - 8.3 g/dL   Albumin 3.0 (L) 3.5 - 5.2 g/dL   AST 29 0 - 37 U/L   ALT 22 0 - 53 U/L   Alkaline Phosphatase 53 39 - 117 U/L   Total Bilirubin 0.3 0.3 - 1.2 mg/dL   GFR calc non Af  Amer 72 (L) >90 mL/min   GFR calc Af Amer 83 (L) >90 mL/min    Comment: (NOTE) The eGFR has been calculated using the CKD EPI equation. This calculation has not been validated in all clinical situations. eGFR's persistently <90 mL/min signify possible Chronic Kidney Disease.    Anion gap 13 5 - 15  Glucose, capillary     Status: Abnormal   Collection Time: 06/27/14  6:50 AM  Result Value Ref Range   Glucose-Capillary 117 (H) 70 - 99 mg/dL     HEENT: normal Cardio: RRR Resp: CTA B/L GI: BS positive and non distended Extremity:  No Edema Skin:   Intact Neuro: Confused, Abnormal Motor 4/5 in BUE and BLE and Abnormal FMC Ataxic/ dec FMC Musc/Skel:  Normal GEN NAD   Assessment/Plan: 1. 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 Physiatrist is providing close team supervision and 24 hour management of active medical problems listed below. Physiatrist and rehab team continue to assess barriers to discharge/monitor patient progress toward functional and medical goals. FIM:                   Comprehension Comprehension Mode: Auditory Comprehension: 3-Understands basic 50 - 74% of the time/requires cueing 25 - 50%  of the time  Expression Expression Mode: Verbal  Social Interaction Social Interaction: 3-Interacts appropriately 50 - 74% of the time - May be physically or verbally inappropriate.  Problem Solving Problem Solving: 2-Solves basic 25 - 49% of the time - needs direction more than half the time to initiate, plan or complete simple activities  Memory Memory: 2-Recognizes or recalls 25 - 49% of the time/requires cueing 51 - 75% of the time  Medical Problem List and Plan: 1. Functional deficits secondary to likely Right Brainstem and /or posterior  circulation infarcts with impaired balance,left hemisensory loss and visual deficits   2.  DVT Prophylaxis/Anticoagulation: SQ heparin.Monitor platelet counts and any signs of bleeding 3. Pain Management: tylenol as needed 4. Hypertension.Lisinopril 10 mg daily.Monitor with increased mobility 5. Neuropsych: This patient is capable of making decisions on his own behalf. 6. Skin/Wound Care: routine skin checks 7. Fluids/Electrolytes/Nutrition: strict I&O,s  Follow up labs 8.Hyperlipidemia. Lipitor 40 mg daily 9.Diabetes  mellitus with peripheral neuropathy. HGB A1c 6.4.Check blood sugars AC&HS.Patient on Glucophage 1500 mg Qam and 500 mg pm prior to admission.Resume as needed 10 Mood/Schizophrenia.Patient on haldol and invega in the past but never refilled med for quite some time and never restarted 11.UDS positive marijuana .Provide counseling  LOS (Days) 1 A FACE TO FACE EVALUATION WAS PERFORMED  [name] Williams E 06/27/2014, 8:30 AM

## 2014-06-28 ENCOUNTER — Encounter (HOSPITAL_COMMUNITY): Payer: Medicare Other | Admitting: Occupational Therapy

## 2014-06-28 ENCOUNTER — Inpatient Hospital Stay (HOSPITAL_COMMUNITY): Payer: Medicare Other | Admitting: Speech Pathology

## 2014-06-28 ENCOUNTER — Inpatient Hospital Stay (HOSPITAL_COMMUNITY): Payer: Medicare Other | Admitting: Physical Therapy

## 2014-06-28 LAB — GLUCOSE, CAPILLARY
GLUCOSE-CAPILLARY: 137 mg/dL — AB (ref 70–99)
Glucose-Capillary: 122 mg/dL — ABNORMAL HIGH (ref 70–99)
Glucose-Capillary: 107 mg/dL — ABNORMAL HIGH (ref 70–99)
Glucose-Capillary: 151 mg/dL — ABNORMAL HIGH (ref 70–99)

## 2014-06-28 NOTE — Progress Notes (Signed)
Occupational Therapy Session Note  Patient Details  Name: [name] Williams MRN: [id number] Date of Birth: 1955-09-20  Today's Date: 06/28/2014 OT Individual Time: 1004-1104 OT Individual Time Calculation (min): 60 min    Short Term Goals: Week 1:  OT Short Term Goal 1 (Week 1): STGs= LTGs set a supervision to modified independent level  Skilled Therapeutic Interventions/Progress Updates:    Pt performed shower and dressing during session.  Mod assist for ambulating to the shower without use of assistive device and hand held assist.  Pt with increased LOB to the left and posteriorly.  He was able to perform all UB bathing with supervision but demonstrated difficulty with divided attention in being able to wash with one hand and hold the hand held shower with the other.  Therapist finally had to cue him to put the shower head down and just work on washing only.  Min assist for sit to stand and standing balance while attempting to wash peri area.  Once finished pt performed grooming tasks sitting at the sink.  Pt does not have any clothing at this time so new gowns donned.  He was able to donn his gripper socks with supervision and increased time.   Therapy Documentation Precautions:  Precautions Precautions: Fall Precaution Comments: s/p fall at home with dizziness and unsteady gait Restrictions Weight Bearing Restrictions: No  Pain: Pain Assessment Pain Assessment: No/denies pain ADL: See FIM for current functional status  Therapy/Group: Individual Therapy  Shamere Dilworth,Filbert OTR/L 06/28/2014, 3:49 PM

## 2014-06-28 NOTE — Progress Notes (Signed)
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 Patient lives with his sister and was independent prior to admission. Admitted 06/23/2014 with increased left-sided weakness, slurred speech and blurred vision . 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    Subjective/Complaints: Seen with SLP, trying to describe his daily activities  Review of Systems - limited verbal output Objective: Vital Signs: Blood pressure 121/69, pulse 54, temperature 97.4 F (36.3 C), temperature source Oral, resp. rate 17, height 5' 10"  (1.778 m), weight 72.031 kg (158 lb 12.8 oz), SpO2 100 %. No results found. Results for orders placed or performed during the hospital encounter of 06/26/14 (from the past 72 hour(s))  CBC     Status: Abnormal   Collection Time: 06/26/14  3:25 PM  Result Value Ref Range   WBC 10.0 4.0 - 10.5 K/uL   RBC 4.62 4.22 - 5.81 MIL/uL   Hemoglobin 12.9 (L) 13.0 - 17.0 g/dL   HCT 40.8 39.0 - 52.0 %   MCV 88.3 78.0 - 100.0 fL   MCH 27.9 26.0 - 34.0 pg   MCHC 31.6 30.0 - 36.0 g/dL   RDW 14.5 11.5 - 15.5 %   Platelets 174 150 - 400 K/uL  Creatinine, serum     Status: Abnormal   Collection Time: 06/26/14  3:25 PM  Result Value Ref Range   Creatinine, Ser 1.26 0.50 - 1.35 mg/dL   GFR calc non Af Amer 62 (L) >90 mL/min   GFR calc Af Amer 72 (L) >90 mL/min    Comment: (NOTE) The eGFR has been calculated using the CKD EPI equation. This calculation has not been validated in all clinical situations. eGFR's persistently <90 mL/min signify possible Chronic Kidney Disease.   Glucose, capillary     Status: Abnormal   Collection Time: 06/26/14  4:22 PM  Result Value Ref Range   Glucose-Capillary 115  (H) 70 - 99 mg/dL  Glucose, capillary     Status: Abnormal   Collection Time: 06/26/14  9:07 PM  Result Value Ref Range   Glucose-Capillary 182 (H) 70 - 99 mg/dL  CBC WITH DIFFERENTIAL     Status: Abnormal   Collection Time: 06/27/14  4:20 AM  Result Value Ref Range   WBC 7.9 4.0 - 10.5 K/uL   RBC 4.61 4.22 - 5.81 MIL/uL   Hemoglobin 12.8 (L) 13.0 - 17.0 g/dL   HCT 39.5 39.0 - 52.0 %   MCV 85.7 78.0 - 100.0 fL   MCH 27.8 26.0 - 34.0 pg   MCHC 32.4 30.0 - 36.0 g/dL   RDW 14.3 11.5 - 15.5 %   Platelets 188 150 - 400 K/uL   Neutrophils Relative % 61 43 - 77 %   Neutro Abs 4.8 1.7 - 7.7 K/uL   Lymphocytes Relative 23 12 - 46 %   Lymphs Abs 1.8 0.7 - 4.0 K/uL   Monocytes Relative 11 3 - 12 %   Monocytes Absolute 0.9 0.1 - 1.0 K/uL   Eosinophils Relative 4 0 - 5 %   Eosinophils Absolute 0.3 0.0 - 0.7 K/uL   Basophils Relative 1 0 - 1 %   Basophils Absolute 0.1 0.0 - 0.1 K/uL  Comprehensive metabolic panel     Status: Abnormal   Collection Time: 06/27/14  4:20 AM  Result Value Ref Range   Sodium 139 137 - 147 mEq/L   Potassium 4.6 3.7 - 5.3 mEq/L   Chloride 103 96 - 112 mEq/L   CO2 23 19 - 32 mEq/L   Glucose, Bld 134 (H) 70 - 99 mg/dL   BUN 19 6 - 23 mg/dL   Creatinine, Ser 1.11 0.50 - 1.35 mg/dL   Calcium 9.1 8.4 - 10.5 mg/dL   Total Protein 7.1 6.0 - 8.3 g/dL   Albumin 3.0 (L) 3.5 - 5.2 g/dL   AST 29 0 - 37 U/L   ALT 22 0 - 53 U/L   Alkaline Phosphatase 53 39 - 117 U/L   Total Bilirubin 0.3 0.3 - 1.2 mg/dL   GFR calc non Af Amer 72 (L) >90 mL/min   GFR calc Af Amer 83 (L) >90 mL/min    Comment: (NOTE) The eGFR has been calculated using the CKD EPI equation. This calculation has not been validated in all clinical situations. eGFR's persistently <90 mL/min signify possible Chronic Kidney Disease.    Anion gap 13 5 - 15  Glucose, capillary     Status: Abnormal   Collection Time: 06/27/14  6:50 AM  Result Value Ref Range   Glucose-Capillary 117 (H) 70 - 99 mg/dL   Glucose, capillary     Status: Abnormal   Collection Time: 06/27/14 12:08 PM  Result Value Ref Range   Glucose-Capillary 119 (H) 70 - 99 mg/dL   Comment 1 Notify RN   Glucose, capillary     Status: Abnormal   Collection Time: 06/27/14  4:28 PM  Result Value Ref Range   Glucose-Capillary 128 (H) 70 - 99 mg/dL   Comment 1 Notify RN   Glucose, capillary     Status: Abnormal   Collection Time: 06/27/14  9:00 PM  Result Value Ref Range   Glucose-Capillary 161 (H) 70 - 99 mg/dL  Glucose, capillary     Status: Abnormal   Collection Time: 06/28/14  7:13 AM  Result Value Ref Range   Glucose-Capillary 122 (H) 70 - 99 mg/dL     HEENT: normal Cardio: RRR Resp: CTA B/L GI: BS positive and non distended Extremity:  No Edema Skin:   Intact Neuro: oriented to [facility], stroke but not date, Abnormal Motor 4/5 in BUE and BLE and Abnormal FMC Ataxic/ dec FMC Musc/Skel:  Normal GEN NAD   Assessment/Plan: 1. 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 Physiatrist is providing close team supervision and 24 hour management of active medical problems listed below. Physiatrist and rehab team continue to assess barriers to discharge/monitor patient progress toward functional and medical goals. FIM: FIM - Bathing Bathing Steps Patient Completed: Chest, Right Arm, Left Arm, Abdomen, Front perineal area, Buttocks, Right upper leg, Left lower leg (including foot), Right lower leg (including foot), Left upper leg Bathing: 4: Min-Patient completes 8-9 31f10 parts or 75+ percent  FIM - Upper Body Dressing/Undressing Upper body dressing/undressing steps patient completed: Thread/unthread left bra strap, Thread/unthread left sleeve of pullover shirt/dress Upper body dressing/undressing: 0: Activity did  not occur FIM - Lower Body Dressing/Undressing Lower body dressing/undressing steps patient completed: Don/Doff right sock, Don/Doff left sock Lower body dressing/undressing: 4: Min-Patient completed 75 plus % of tasks     FIM - TRadio producerDevices: Elevated toilet seat, Grab bars  Toilet Transfers: 3-From toilet/BSC: Mod A (lift or lower assist), 3-To toilet/BSC: Mod A (lift or lower assist)  FIM - Bed/Chair Transfer Bed/Chair Transfer: 5: Supine > Sit: Supervision (verbal cues/safety issues), 3: Bed > Chair or W/C: Mod A (lift or lower assist)  FIM - Locomotion: Wheelchair Distance: 150 Locomotion: Wheelchair: 3: Travels 150 ft or more: maneuvers on rugs and over door sills with moderate assistance  (Pt: 50 - 74%) FIM - Locomotion: Ambulation Locomotion: Ambulation Assistive Devices: Other (comment) (no AD) Ambulation/Gait Assistance: 3: Mod assist Locomotion: Ambulation: 2: Travels 50 - 149 ft with moderate assistance (Pt: 50 - 74%)  Comprehension Comprehension Mode: Auditory Comprehension: 5-Understands basic 90% of the time/requires cueing < 10% of the time  Expression Expression Mode: Verbal Expression: 5-Expresses basic 90% of the time/requires cueing < 10% of the time.  Social Interaction Social Interaction: 5-Interacts appropriately 90% of the time - Needs monitoring or encouragement for participation or interaction.  Problem Solving Problem Solving: 3-Solves basic 50 - 74% of the time/requires cueing 25 - 49% of the time  Memory Memory: 4-Recognizes or recalls 75 - 89% of the time/requires cueing 10 - 24% of the time  Medical Problem List and Plan: 1. 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  DVT Prophylaxis/Anticoagulation: SQ heparin.Monitor platelet counts and any signs of bleeding 3. Pain Management: tylenol  as needed 4. Hypertension.Lisinopril 10 mg daily.Monitor with increased mobility 5. Neuropsych: This patient is capable of making decisions on his own behalf. 6. Skin/Wound Care: routine skin checks 7. Fluids/Electrolytes/Nutrition: strict I&O,s  Follow up labs 8.Hyperlipidemia. Lipitor 40 mg daily 9.Diabetes mellitus with peripheral neuropathy. HGB A1c 6.4.Check blood sugars AC&HS.Patient on Glucophage 1500 mg Qam and 500 mg pm prior to admission.Resume as needed 10 Mood/Schizophrenia.Patient on haldol and invega in the past but never refilled med for quite some time and never restarted 11.UDS positive marijuana .Provide counseling  LOS (Days) 2 A FACE TO FACE EVALUATION WAS PERFORMED  [name] Williams E 06/28/2014, 10:57 AM

## 2014-06-28 NOTE — Progress Notes (Signed)
Speech Language Pathology Daily Session Note  Patient Details  Name: [name] Williams MRN: [id number] Date of Birth: [birth year]  Today's Date: 06/28/2014 SLP Individual Time: 0830-0930 SLP Individual Time Calculation (min): 60 min  Short Term Goals: Week 1: SLP Short Term Goal 1 (Week 1): STGs = LTGs  Skilled Therapeutic Interventions: Skilled treatment session focused on addressing cognitive goals.  Patient required Min verbal cues to recall and utilize safe hand placement during bed to chair transfer.  SLP facilitated session with problem picture cards and patient was able to identify the Insafe activity with Supervision, but required Min question cues to come up with the safe solution to the problem.  SLP noted decreased intelligibility as compared to yesterday's session and as a result care plan was modified.  SLP also educated patient on speech intelligibility strategies and provided Mod verbal cues throughout session to utilize at the sentence level of verbal expression.  Continue with current plan of care.   FIM:  Comprehension Comprehension Mode: Auditory Comprehension: 5-Understands basic 90% of the time/requires cueing < 10% of the time Expression Expression Mode: Verbal Expression: 3-Expresses basic 50 - 74% of the time/requires cueing 25 - 50% of the time. Needs to repeat parts of sentences. Social Interaction Social Interaction: 4-Interacts appropriately 75 - 89% of the time - Needs redirection for appropriate language or to initiate interaction. Problem Solving Problem Solving: 3-Solves basic 50 - 74% of the time/requires cueing 25 - 49% of the time Memory Memory: 3-Recognizes or recalls 50 - 74% of the time/requires cueing 25 - 49% of the time  Pain Pain Assessment Pain Assessment: No/denies pain  Therapy/Group: Individual Therapy  Charlane FerrettiMelissa Leonna Schlee, M.A., CCC-SLP [phone]  [name] Williams 06/28/2014, 1:20 PM

## 2014-06-28 NOTE — Progress Notes (Signed)
Physical Therapy Session Note  Patient Details  Name: [name] Williams MRN: [id number] Date of Birth: [birth year]  Today's Date: 06/28/2014 PT Individual Time: 0930-1000 and 1500-1535 PT Individual Time Calculation (min): 30 min and 35 min   Short Term Goals: Week 1:  PT Short Term Goal 1 (Week 1): = LTGs due to ELOS  Skilled Therapeutic Interventions/Progress Updates:   AM Session: Pt received seated in w/c, agreeable to therapy. Pt propelled w/c using BUE throughout rehab unit in controlled environment x >150 ft with min A to avoid obstacles, pt noted to have increased difficulty with sequencing turning even with max instructional cues. Gait training using RW x 50 ft with mod A to prevent LOB to left. Pt demonstrates L knee hyperextension in stance phase, attempted to use Swedish knee cage but trial brace too large for patient's leg. Transitioned to gait training with L HHA and min-mod A x 50 ft with decreased LOB/lateral lean to L compared to yesterday. Pt performed stand pivot transfer w/c > recliner with mod A and max cues for technique and safety. Pt left sitting in recliner with all needs within reach.   PM Session: Pt received in bathroom, RN tech reporting that patient's safety plan has been updated due to patient getting up on his own to ambulate to bathroom and in/out of bed without calling for assistance. Reinforced patient safety in room. Gait training using RW x >150 ft using RW with min A and no LOB noted, narrow BOS but decreased lateral lean to left. To challenge dynamic standing balance and promote weight shift anterior and to the right, patient reached across midline with RUE to grab horse shoes on L and place on raised basketball goal outside BOS up and to the right. After seated rest, patient reached to remove horseshoes from raised goal and rotate to place on lowered tray table with supervision and verbal cues for safety. Gait training without AD and close supervision-min A x >150 ft  with increased lateral sway and decreased balance, patient requires multiple cues for safety to stop to regain balance before continuing with ambulation due to decreased control. Patient left sitting in recliner with quick release belt on and all needs within reach.  Therapy Documentation Precautions:  Precautions Precautions: Fall Restrictions Weight Bearing Restrictions: No Pain: Pain Assessment Pain Assessment: No/denies pain Pain Score: 0-No pain  See FIM for current functional status  Therapy/Group: Individual Therapy  Kerney ElbeVarner, Camdynn Maranto A 06/28/2014, 12:11 PM

## 2014-06-29 ENCOUNTER — Inpatient Hospital Stay (HOSPITAL_COMMUNITY): Payer: Medicare Other | Admitting: Speech Pathology

## 2014-06-29 ENCOUNTER — Inpatient Hospital Stay (HOSPITAL_COMMUNITY): Payer: Medicare Other | Admitting: Physical Therapy

## 2014-06-29 ENCOUNTER — Inpatient Hospital Stay (HOSPITAL_COMMUNITY): Payer: Medicare Other | Admitting: Occupational Therapy

## 2014-06-29 LAB — GLUCOSE, CAPILLARY
GLUCOSE-CAPILLARY: 110 mg/dL — AB (ref 70–99)
Glucose-Capillary: 109 mg/dL — ABNORMAL HIGH (ref 70–99)
Glucose-Capillary: 100 mg/dL — ABNORMAL HIGH (ref 70–99)
Glucose-Capillary: 139 mg/dL — ABNORMAL HIGH (ref 70–99)

## 2014-06-29 NOTE — Progress Notes (Signed)
Speech Language Pathology Daily Session Note  Patient Details  Name: Cleotilde NeerJames Lipford MRN: [id number] Date of Birth: 18-May-1956  Today's Date: 06/29/2014 SLP Individual Time: [id number] SLP Individual Time Calculation (min): 30 min  Short Term Goals: Week 1: SLP Short Term Goal 1 (Week 1): STGs = LTGs  Skilled Therapeutic Interventions: Skilled treatment session focused on addressing speech intelligibility goals.  Upon SLP arrival patient sitting in recliner with quick release belt undone.  SLP questioned patient regarding why he had it and he was bale o recall and verbalize that he had gotten up without assist even though he was not supposed to and that he did not like having that on.  SLP re-educated on importance of utilizing safes precautions due to severity of balance deficits.  Patient verbalized understanding.  SLP facilitated session with re-education on use of slow rate of speech to maximize speech intelligibility at the phrase-sentence level of verbal expression, which patient was able to do with Mod faded to Min cues today during a structured task.  Session ended with patient in recliner without quick release belt due to patient request; RN and PT notified. Continue with current plan of care.   FIM:  Comprehension Comprehension Mode: Auditory Comprehension: 5-Understands basic 90% of the time/requires cueing < 10% of the time Expression Expression Mode: Verbal Expression: 4-Expresses basic 75 - 89% of the time/requires cueing 10 - 24% of the time. Needs helper to occlude trach/needs to repeat words. Social Interaction Social Interaction: 4-Interacts appropriately 75 - 89% of the time - Needs redirection for appropriate language or to initiate interaction. Problem Solving Problem Solving: 3-Solves basic 50 - 74% of the time/requires cueing 25 - 49% of the time Memory Memory: 3-Recognizes or recalls 50 - 74% of the time/requires cueing 25 - 49% of the time  Pain Pain  Assessment Pain Assessment: No/denies pain  Therapy/Group: Individual Therapy  Charlane FerrettiMelissa Avaeh Ewer, M.A., CCC-SLP [phone]  Derald Lorge 06/29/2014, 2:18 PM

## 2014-06-29 NOTE — Progress Notes (Signed)
58 y.o.right handed male  Admitted 06/23/2014 with increased left-sided weakness, slurred speech and blurred vision . 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    Subjective/Complaints: In bed, no complaints.  Review of Systems - limited verbal output Objective: Vital Signs: Blood pressure 128/73, pulse 50, temperature 97.6 F (36.4 C), temperature source Oral, resp. rate 18, height 5' 10"  (1.778 m), weight 72.031 kg (158 lb 12.8 oz), SpO2 100 %. No results found. Results for orders placed or performed during the hospital encounter of 06/26/14 (from the past 72 hour(s))  CBC     Status: Abnormal   Collection Time: 06/26/14  3:25 PM  Result Value Ref Range   WBC 10.0 4.0 - 10.5 K/uL   RBC 4.62 4.22 - 5.81 MIL/uL   Hemoglobin 12.9 (L) 13.0 - 17.0 g/dL   HCT 40.8 39.0 - 52.0 %   MCV 88.3 78.0 - 100.0 fL   MCH 27.9 26.0 - 34.0 pg   MCHC 31.6 30.0 - 36.0 g/dL   RDW 14.5 11.5 - 15.5 %   Platelets 174 150 - 400 K/uL  Creatinine, serum     Status: Abnormal   Collection Time: 06/26/14  3:25 PM  Result Value Ref Range   Creatinine, Ser 1.26 0.50 - 1.35 mg/dL   GFR calc non Af Amer 62 (L) >90 mL/min   GFR calc Af Amer 72 (L) >90 mL/min    Comment: (NOTE) The eGFR has been calculated using the CKD EPI equation. This calculation has not been validated in all clinical situations. eGFR's persistently <90 mL/min signify possible Chronic Kidney Disease.   Glucose, capillary     Status: Abnormal   Collection Time: 06/26/14  4:22 PM  Result Value Ref Range   Glucose-Capillary 115 (H) 70 - 99 mg/dL  Glucose, capillary     Status: Abnormal   Collection Time: 06/26/14  9:07 PM  Result Value Ref Range   Glucose-Capillary 182 (H) 70 - 99 mg/dL  CBC WITH DIFFERENTIAL     Status: Abnormal   Collection Time: 06/27/14  4:20 AM  Result Value Ref Range   WBC 7.9 4.0 - 10.5 K/uL   RBC 4.61 4.22 - 5.81 MIL/uL   Hemoglobin 12.8 (L) 13.0 - 17.0  g/dL   HCT 39.5 39.0 - 52.0 %   MCV 85.7 78.0 - 100.0 fL   MCH 27.8 26.0 - 34.0 pg   MCHC 32.4 30.0 - 36.0 g/dL   RDW 14.3 11.5 - 15.5 %   Platelets 188 150 - 400 K/uL   Neutrophils Relative % 61 43 - 77 %   Neutro Abs 4.8 1.7 - 7.7 K/uL   Lymphocytes Relative 23 12 - 46 %   Lymphs Abs 1.8 0.7 - 4.0 K/uL   Monocytes Relative 11 3 - 12 %   Monocytes Absolute 0.9 0.1 - 1.0 K/uL   Eosinophils Relative 4 0 - 5 %   Eosinophils Absolute 0.3 0.0 - 0.7 K/uL   Basophils Relative 1 0 - 1 %   Basophils Absolute 0.1 0.0 - 0.1 K/uL  Comprehensive metabolic panel     Status: Abnormal   Collection Time: 06/27/14  4:20 AM  Result Value Ref Range   Sodium 139 137 - 147 mEq/L   Potassium 4.6 3.7 - 5.3 mEq/L   Chloride 103 96 - 112 mEq/L   CO2 23 19 - 32 mEq/L   Glucose, Bld 134 (H) 70 - 99 mg/dL  BUN 19 6 - 23 mg/dL   Creatinine, Ser 1.11 0.50 - 1.35 mg/dL   Calcium 9.1 8.4 - 10.5 mg/dL   Total Protein 7.1 6.0 - 8.3 g/dL   Albumin 3.0 (L) 3.5 - 5.2 g/dL   AST 29 0 - 37 U/L   ALT 22 0 - 53 U/L   Alkaline Phosphatase 53 39 - 117 U/L   Total Bilirubin 0.3 0.3 - 1.2 mg/dL   GFR calc non Af Amer 72 (L) >90 mL/min   GFR calc Af Amer 83 (L) >90 mL/min    Comment: (NOTE) The eGFR has been calculated using the CKD EPI equation. This calculation has not been validated in all clinical situations. eGFR's persistently <90 mL/min signify possible Chronic Kidney Disease.    Anion gap 13 5 - 15  Glucose, capillary     Status: Abnormal   Collection Time: 06/27/14  6:50 AM  Result Value Ref Range   Glucose-Capillary 117 (H) 70 - 99 mg/dL  Glucose, capillary     Status: Abnormal   Collection Time: 06/27/14 12:08 PM  Result Value Ref Range   Glucose-Capillary 119 (H) 70 - 99 mg/dL   Comment 1 Notify RN   Glucose, capillary     Status: Abnormal   Collection Time: 06/27/14  4:28 PM  Result Value Ref Range   Glucose-Capillary 128 (H) 70 - 99 mg/dL   Comment 1 Notify RN   Glucose, capillary      Status: Abnormal   Collection Time: 06/27/14  9:00 PM  Result Value Ref Range   Glucose-Capillary 161 (H) 70 - 99 mg/dL  Glucose, capillary     Status: Abnormal   Collection Time: 06/28/14  7:13 AM  Result Value Ref Range   Glucose-Capillary 122 (H) 70 - 99 mg/dL  Glucose, capillary     Status: Abnormal   Collection Time: 06/28/14 11:22 AM  Result Value Ref Range   Glucose-Capillary 107 (H) 70 - 99 mg/dL   Comment 1 Notify RN   Glucose, capillary     Status: Abnormal   Collection Time: 06/28/14  4:20 PM  Result Value Ref Range   Glucose-Capillary 137 (H) 70 - 99 mg/dL   Comment 1 Notify RN   Glucose, capillary     Status: Abnormal   Collection Time: 06/28/14  9:00 PM  Result Value Ref Range   Glucose-Capillary 151 (H) 70 - 99 mg/dL  Glucose, capillary     Status: Abnormal   Collection Time: 06/29/14  6:42 AM  Result Value Ref Range   Glucose-Capillary 109 (H) 70 - 99 mg/dL     HEENT: normal Cardio: RRR Resp: CTA B/L GI: BS positive and non distended Extremity:  No Edema Skin:   Intact Neuro: oriented to [facility], stroke but not date, Abnormal Motor 4/5 in BUE and BLE and Abnormal FMC Ataxic/ dec FMC Musc/Skel:  Normal GEN NAD Oriented to person place, not day or date or month  Assessment/Plan: 1. 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 Physiatrist is providing close team supervision and 24 hour management of active medical problems listed below. Physiatrist and rehab team continue to assess barriers to discharge/monitor patient progress toward functional and medical goals. FIM: FIM - Bathing Bathing Steps Patient Completed: Chest, Right Arm, Left Arm, Abdomen, Front perineal area, Buttocks, Right upper leg, Left lower  leg (including foot), Right lower leg  (including foot), Left upper leg Bathing: 4: Min-Patient completes 8-9 [id number] parts or 75+ percent  FIM - Upper Body Dressing/Undressing Upper body dressing/undressing steps patient completed: Thread/unthread left bra strap, Thread/unthread left sleeve of pullover shirt/dress Upper body dressing/undressing: 0: Activity did not occur FIM - Lower Body Dressing/Undressing Lower body dressing/undressing steps patient completed: Don/Doff right sock, Don/Doff left sock Lower body dressing/undressing: 4: Min-Patient completed 75 plus % of tasks     FIM - TRadio producerDevices: Elevated toilet seat, Grab bars Toilet Transfers: 3-From toilet/BSC: Mod A (lift or lower assist), 3-To toilet/BSC: Mod A (lift or lower assist)  FIM - Bed/Chair Transfer Bed/Chair Transfer: 5: Supine > Sit: Supervision (verbal cues/safety issues), 4: Bed > Chair or W/C: Min A (steadying Pt. > 75%), 4: Chair or W/C > Bed: Min A (steadying Pt. > 75%)  FIM - Locomotion: Wheelchair Distance: 150 Locomotion: Wheelchair: 0: Activity did not occur FIM - Locomotion: Ambulation Locomotion: Ambulation Assistive Devices: WAdministratorAmbulation/Gait Assistance: 4: Min assist, 4: Min guard, 5: Supervision Locomotion: Ambulation: 4: Travels 150 ft or more with minimal assistance (Pt.>75%)  Comprehension Comprehension Mode: Auditory Comprehension: 5-Understands basic 90% of the time/requires cueing < 10% of the time  Expression Expression Mode: Verbal Expression: 3-Expresses basic 50 - 74% of the time/requires cueing 25 - 50% of the time. Needs to repeat parts of sentences.  Social Interaction Social Interaction: 4-Interacts appropriately 75 - 89% of the time - Needs redirection for appropriate language or to initiate interaction.  Problem Solving Problem Solving: 3-Solves basic 50 - 74% of the time/requires cueing 25 - 49% of the time  Memory Memory: 3-Recognizes or recalls 50 - 74% of  the time/requires cueing 25 - 49% of the time  Medical Problem List and Plan: 1. 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  DVT Prophylaxis/Anticoagulation: SQ heparin.Monitor platelet counts and any signs of bleeding 3. Pain Management: tylenol as needed 4. Hypertension.Lisinopril 10 mg daily.Monitor with increased mobility 5. Neuropsych: This patient is capable of making decisions on his own behalf. 6. Skin/Wound Care: routine skin checks 7. Fluids/Electrolytes/Nutrition: strict I&O,s  Follow up labs 8.Hyperlipidemia. Lipitor 40 mg daily 9.Diabetes mellitus with peripheral neuropathy. HGB A1c 6.4.Check blood sugars AC&HS.Patient on Glucophage 1500 mg Qam and 500 mg pm prior to admission.Resume as needed 10 Mood/Schizophrenia.Patient on haldol and invega in the past but never refilled med for quite some time and never restarted 11.UDS positive marijuana .Provide counseling  LOS (Days) 3 A FACE TO FACE EVALUATION WAS PERFORMED  Sylvio Weatherall E 06/29/2014, 10:39 AM

## 2014-06-29 NOTE — Progress Notes (Signed)
Patient continues to get up without staff assistance.  Refusing pink belt for safety.  Bed alarm recommended when in bed.

## 2014-06-29 NOTE — Progress Notes (Signed)
Physical Therapy Session Note  Patient Details  Name: [name] Williams Guidice MRN: [id number] Date of Birth: 1955/10/30  Today's Date: 06/29/2014 PT Individual Time: 0800-0900 and 1415-1500 PT Individual Time Calculation (min): 60 min and 45 min  Short Term Goals: Week 1:  PT Short Term Goal 1 (Week 1): = LTGs due to ELOS  Skilled Therapeutic Interventions/Progress Updates:   AM Session: Pt received semi reclined in bed, agreeable to therapy. Pt performed bed mobility with supervision and ambulated using RW x >150 ft with min A progressed to close supervision, unsteady gait but no LOB. Pt keeps head very still during gait; with questioning, able to report dizziness with head turns with mobility. Re-administered Berg Balance Scale due to quick patient progress noted with improvement from 10/56 on evaluation to 27/56 during session. Pt educated on improvement but reinforced that he continues to be at high risk for falls, pt verbalized understanding. Pt performed alternating step taps to 7" step using L rail for UE support for improved weight shifting and balance training. Pt negotiated up/down flight of stairs in stairwell using R rail ascending with step-to pattern and min-mod A for balance. For NMR, patient achieved quadruped position with max verbal/tactile cues to maintain hips over knees and shoulders over hands, then progressed to reaching for rings with LUE and placing to target to facilitate weight shift to R and anterior. Pt ambulated back to room using RW x > 150 ft with min A to prevent LOB with turning and safe use of RW and left sitting in recliner with quick release belt on and all needs within reach.   PM Session: Pt received asleep in bed, easily aroused to voice. Pt transferred to EOB from supine with supervision. Gait training using RW 2 x >150 ft with supervision and no LOB, therapist intermittently facilitating increased gait speed by moving RW or with verbal cues. Pt ambulated using RW 2 x 75  ft with verbal cues for head turns in all directions. Pt with no LOB or path deviations with head turns but demo very small movement changes in all directions. Pt performed car transfer to sedan height using RW with supervision and verbal cues for technique. To challenge dynamic balance, patient performed bounce passes and chest passes with basketball back and forth with therapist in standing with no LOB. Progressed to patient "dribbling"/bouncing basketball to himself during ambulation and bounce passing basketball back and forth with therapist while ambulating. Pt with 2 LOB during balance challenges but overall supervision level. Pt demonstrates consistently improved balance in PM versus AM. Pt returned to room and left seated in recliner with quick release belt in place and needs within reach.  Therapy Documentation Precautions:  Precautions Precautions: Fall Precaution Comments: s/p fall at home with dizziness and unsteady gait Restrictions Weight Bearing Restrictions: No Pain:  Denies pain Balance: Balance Balance Assessed: Yes Standardized Balance Assessment Standardized Balance Assessment: Berg Balance Test Berg Balance Test Sit to Stand: Able to stand using hands after several tries Standing Unsupported: Able to stand 2 minutes with supervision Sitting with Back Unsupported but Feet Supported on Floor or Stool: Able to sit safely and securely 2 minutes Stand to Sit: Controls descent by using hands Transfers: Able to transfer with verbal cueing and /or supervision Standing Unsupported with Eyes Closed: Able to stand 10 seconds with supervision Standing Ubsupported with Feet Together: Able to place feet together independently but unable to hold for 30 seconds From Standing, Reach Forward with Outstretched Arm: Reaches forward but needs  supervision From Standing Position, Pick up Object from Floor: Able to pick up shoe, needs supervision From Standing Position, Turn to Look Behind  Over each Shoulder: Turn sideways only but maintains balance Turn 360 Degrees: Needs assistance while turning Standing Unsupported, Alternately Place Feet on Step/Stool: Able to complete >2 steps/needs minimal assist Standing Unsupported, One Foot in Front: Loses balance while stepping or standing Standing on One Leg: Tries to lift leg/unable to hold 3 seconds but remains standing independently Total Score: 27/56  See FIM for current functional status  Therapy/Group: Individual Therapy  Kerney ElbeVarner, Maxfield Gildersleeve A 06/29/2014, 9:03 AM

## 2014-06-29 NOTE — Plan of Care (Signed)
Problem: RH BLADDER ELIMINATION Goal: RH STG MANAGE BLADDER WITH ASSISTANCE STG Manage Bladder With Min Assistance  Outcome: Progressing  Problem: RH SAFETY Goal: RH STG ADHERE TO SAFETY PRECAUTIONS W/ASSISTANCE/DEVICE STG Adhere to Safety Precautions With Min Assistance/Device.  Outcome: Progressing  Problem: RH COGNITION-NURSING Goal: RH STG USES MEMORY AIDS/STRATEGIES W/ASSIST TO PROBLEM SOLVE STG Uses Memory Aids/Strategies With Mod Assistance to Problem Solve.  Outcome: Progressing

## 2014-06-29 NOTE — Progress Notes (Signed)
Occupational Therapy Session Note  Patient Details  Name: [name] Williams MRN: [id number] Date of Birth: 1956-03-26  Today's Date: 06/29/2014 OT Individual Time: 1145-1230 OT Individual Time Calculation (min): 45 min   Skilled Therapeutic Interventions/Progress Updates:   The majority of this skilled OT session focused on postural control, trunk stability, dynamic and vestibular balance and visual motor/perceptual skills.   Patient did state that his vision is/was "fuzzy" at times and "I have had a problem with this left eye since that first stroke."  Patient stood at sink to complete bathing and though he tended to "drift"/lean left during this dynamic tasks, he was able to right himself independently and never lost his balance.   When asked if he saw in the mirror that he was leaning left, he stated, "yes."   He only sat x 1 (due to c/o fatigue) for about 5 seconds while he stood to wash his body (excluding legs and feet today).     No new clothes are arrived for patient to don yet; so, she donned a hospital gown and tredded yellow socks after his batheing today.     Therapy Documentation Precautions:  Precautions Precautions: Fall Precaution Comments: s/p fall at home with dizziness and unsteady gait Restrictions Weight Bearing Restrictions: No  Pain: Pain Assessment Pain Assessment: No/denies pain Pain Score: 0-No pain Faces Pain Scale: No hurt PAINAD (Pain Assessment in Advanced Dementia) Breathing: normal See FIM for current functional status  Therapy/Group: Individual Therapy  Bud Faceickett, Shonnie Poudrier [facility]ary 06/29/2014, 4:36 PM

## 2014-06-30 ENCOUNTER — Inpatient Hospital Stay (HOSPITAL_COMMUNITY): Payer: Medicare Other

## 2014-06-30 ENCOUNTER — Inpatient Hospital Stay (HOSPITAL_COMMUNITY): Payer: Medicare Other | Admitting: *Deleted

## 2014-06-30 DIAGNOSIS — I69993 Ataxia following unspecified cerebrovascular disease: Secondary | ICD-10-CM

## 2014-06-30 DIAGNOSIS — R270 Ataxia, unspecified: Secondary | ICD-10-CM

## 2014-06-30 HISTORY — DX: Ataxia following unspecified cerebrovascular disease: I69.993

## 2014-06-30 LAB — GLUCOSE, CAPILLARY
GLUCOSE-CAPILLARY: 95 mg/dL (ref 70–99)
GLUCOSE-CAPILLARY: 101 mg/dL — AB (ref 70–99)
Glucose-Capillary: 105 mg/dL — ABNORMAL HIGH (ref 70–99)
Glucose-Capillary: 186 mg/dL — ABNORMAL HIGH (ref 70–99)

## 2014-06-30 NOTE — Progress Notes (Signed)
58 y.o.right handed male  Admitted 06/23/2014 with increased left-sided weakness, slurred speech and blurred vision . 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    Subjective/Complaints: Eating well, bowel and bladder ok per pt  Review of Systems - limited verbal output Objective: Vital Signs: Blood pressure 128/78, pulse 63, temperature 97.7 F (36.5 C), temperature source Oral, resp. rate 18, height 5\' 10"  (1.778 m), weight 72.031 kg (158 lb 12.8 oz), SpO2 100 %. No results found. Results for orders placed or performed during the hospital encounter of 06/26/14 (from the past 72 hour(s))  Glucose, capillary     Status: Abnormal   Collection Time: 06/27/14 12:08 PM  Result Value Ref Range   Glucose-Capillary 119 (H) 70 - 99 mg/dL   Comment 1 Notify RN   Glucose, capillary     Status: Abnormal   Collection Time: 06/27/14  4:28 PM  Result Value Ref Range   Glucose-Capillary 128 (H) 70 - 99 mg/dL   Comment 1 Notify RN   Glucose, capillary     Status: Abnormal   Collection Time: 06/27/14  9:00 PM  Result Value Ref Range   Glucose-Capillary 161 (H) 70 - 99 mg/dL  Glucose, capillary     Status: Abnormal   Collection Time: 06/28/14  7:13 AM  Result Value Ref Range   Glucose-Capillary 122 (H) 70 - 99 mg/dL  Glucose, capillary     Status: Abnormal   Collection Time: 06/28/14 11:22 AM  Result Value Ref Range   Glucose-Capillary 107 (H) 70 - 99 mg/dL   Comment 1 Notify RN   Glucose, capillary     Status: Abnormal   Collection Time: 06/28/14  4:20 PM  Result Value Ref Range   Glucose-Capillary 137 (H) 70 - 99 mg/dL   Comment 1 Notify RN   Glucose, capillary     Status: Abnormal   Collection Time: 06/28/14  9:00 PM  Result Value Ref Range   Glucose-Capillary 151 (H) 70 - 99 mg/dL  Glucose, capillary     Status: Abnormal   Collection Time: 06/29/14  6:42 AM  Result Value Ref Range   Glucose-Capillary 109 (H) 70 - 99 mg/dL   Glucose, capillary     Status: Abnormal   Collection Time: 06/29/14 11:24 AM  Result Value Ref Range   Glucose-Capillary 100 (H) 70 - 99 mg/dL   Comment 1 Notify RN   Glucose, capillary     Status: Abnormal   Collection Time: 06/29/14  4:30 PM  Result Value Ref Range   Glucose-Capillary 110 (H) 70 - 99 mg/dL   Comment 1 Notify RN   Glucose, capillary     Status: Abnormal   Collection Time: 06/29/14  9:12 PM  Result Value Ref Range   Glucose-Capillary 139 (H) 70 - 99 mg/dL  Glucose, capillary     Status: None   Collection Time: 06/30/14  6:39 AM  Result Value Ref Range   Glucose-Capillary 95 70 - 99 mg/dL     HEENT: normal Cardio: RRR Resp: CTA B/L GI: BS positive and non distended Extremity:  No Edema Skin:   [facility] but not date, Abnormal Motor 4/5 in BUE and BLE and Abnormal FMC Ataxic/ dec FMC, Dysdiadochokinesis LUE Musc/Skel:  Normal GEN NAD Oriented to person place, not day or date or month  Assessment/Plan: 1. 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 Physiatrist is providing close team supervision and 24 hour management of active medical problems listed below. Physiatrist and rehab team continue to assess barriers to discharge/monitor patient progress toward functional and medical goals. FIM: FIM - Bathing Bathing Steps Patient Completed: Chest, Right Arm, Left Arm, Abdomen, Front perineal area, Buttocks, Right upper leg, Left upper leg (stood for bathing today at sink today for the session) Bathing: 5: Supervision: Safety issues/verbal cues  FIM - Upper Body Dressing/Undressing Upper body dressing/undressing steps patient completed: Thread/unthread left bra strap, Thread/unthread left sleeve of pullover shirt/dress Upper  body dressing/undressing: 0: Wears gown/pajamas-no public clothing FIM - Lower Body Dressing/Undressing Lower body dressing/undressing steps patient completed: Don/Doff right sock, Don/Doff left sock Lower body dressing/undressing: 0: Wears gown/pajamas-no public clothing  FIM - Toileting Toileting: 0: Activity did not occur  FIM - Diplomatic Services operational officerToilet Transfers Toilet Transfers Assistive Devices: Elevated toilet seat, Grab bars Toilet Transfers: 0-Activity did not occur  FIM - Games developerBed/Chair Transfer Bed/Chair Transfer: 5: Supine > Sit: Supervision (verbal cues/safety issues), 4: Bed > Chair or W/C: Min A (steadying Pt. > 75%), 4: Chair or W/C > Bed: Min A (steadying Pt. > 75%)  FIM - Locomotion: Wheelchair Distance: 150 Locomotion: Wheelchair: 0: Activity did not occur FIM - Locomotion: Ambulation Locomotion: Ambulation Assistive Devices: Designer, industrial/productWalker - Rolling Ambulation/Gait Assistance: 4: Min assist, 4: Min guard, 5: Supervision Locomotion: Ambulation: 4: Travels 150 ft or more with minimal assistance (Pt.>75%)  Comprehension Comprehension Mode: Auditory Comprehension: 5-Understands basic 90% of the time/requires cueing < 10% of the time  Expression Expression Mode: Verbal Expression: 3-Expresses basic 50 - 74% of the time/requires cueing 25 - 50% of the time. Needs to repeat parts of sentences.  Social Interaction Social Interaction: 4-Interacts appropriately 75 - 89% of the time - Needs redirection for appropriate language or to initiate interaction.  Problem Solving Problem Solving: 3-Solves basic 50 - 74% of the time/requires cueing 25 - 49% of the time  Memory Memory: 3-Recognizes or recalls 50 - 74% of the time/requires cueing 25 - 49% of the time  Medical Problem List and Plan: 1. 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  DVT Prophylaxis/Anticoagulation: SQ  heparin.Monitor platelet counts and any signs of bleeding 3. Pain Management: tylenol as needed 4. Hypertension.Lisinopril 10 mg daily.Monitor with increased mobility 5. Neuropsych: This patient is capable of making decisions on his own behalf. 6. Skin/Wound Care: routine skin checks 7. Fluids/Electrolytes/Nutrition: strict I&O,s  Follow up labs 8.Hyperlipidemia. Lipitor 40 mg daily 9.Diabetes mellitus with peripheral neuropathy. HGB A1c 6.4.Check blood sugars AC&HS.Patient on Glucophage 1500 mg Qam and 500 mg pm prior to admission.Resume as needed 10 Mood/Schizophrenia.Patient on haldol and invega in the past but never refilled med for quite some time and never restarted 11.UDS positive marijuana .Provide counseling  LOS (Days) 4 A FACE TO FACE EVALUATION WAS PERFORMED  Toye Rouillard E 06/30/2014, 10:09 AM

## 2014-06-30 NOTE — Plan of Care (Signed)
Problem: RH BLADDER ELIMINATION Goal: RH STG MANAGE BLADDER WITH ASSISTANCE STG Manage Bladder With Min Assistance  Outcome: Progressing  Problem: RH SAFETY Goal: RH STG ADHERE TO SAFETY PRECAUTIONS W/ASSISTANCE/DEVICE STG Adhere to Safety Precautions With Min Assistance/Device.  Outcome: Progressing

## 2014-06-30 NOTE — Progress Notes (Addendum)
Physical Therapy Session Note  Patient Details  Name: [name] Williams MRN: [id number] Date of Birth: [birth year]  Today's Date: 06/30/2014 PT Individual Time: 1000-1100 PT Individual Time Calculation (min): 60 min   Short Term Goals: Week 1:  PT Short Term Goal 1 (Week 1): = LTGs due to ELOS  Skilled Therapeutic Interventions/Progress Updates:    Pt received supine in bed, agreeable to participate in therapy. Moved supine>sit w/ HOB elevated, use of bed rails and SBA. SPT w/ RW bed>w/c w/ MinA, mod VC's for foot placement and sequencing. Pt propelled w/c 150' to rehab gym. SPT w/ RW to Nustep. Pt performed 12' on Nustep LE only L3 for bilateral strengthening and increased sensory feedback. Therapist placed 18x18 basic cushion in pt's w/c, as pt had been sitting on pillow in w/c. Pt w/ 2x6 reaching for horseshoes at limit of stability in upper R quadrant and handing them to therapist in mid-lower R quadrant. Ambulated in hallway w/ L hallway rail w/ emphasis on large, smooth steps, Min-ModA for placing R foot. x10 mini-squats at edge of mat for LE coordination and NMR. Gait training w/ RW w/ MinA for weight shifting L in order to clear R foot, no9ted improved gait mechanics when therapist increased speed on RW, no LOB noted. Pt propelled w/c back to room w/ MinA for navigating environment. SPT w/ RW w/c>recliner. Pt left seated in recliner w/ QRB on and all needs within reach.   Therapy Documentation Precautions:  Precautions Precautions: Fall Precaution Comments: s/p fall at home with dizziness and unsteady gait Restrictions Weight Bearing Restrictions: No General:   Vital Signs: Therapy Vitals Temp: 97.7 F (36.5 C) Temp Source: Oral Pulse Rate: 63 Resp: 18 BP: 128/78 mmHg Patient Position (if appropriate): Lying Oxygen Therapy SpO2: 100 % O2 Device: Not Delivered Pain:   0/10 Mobility:   Locomotion :    Trunk/Postural Assessment :    Balance:   Exercises:   Other  Treatments:    See FIM for current functional status  Therapy/Group: Individual Therapy  [name] Williams, [name] Williams  [name] Williams, PT, DPT 06/30/2014, 7:39 AM

## 2014-07-01 ENCOUNTER — Inpatient Hospital Stay (HOSPITAL_COMMUNITY): Payer: Medicare Other | Admitting: Occupational Therapy

## 2014-07-01 ENCOUNTER — Inpatient Hospital Stay (HOSPITAL_COMMUNITY): Payer: Medicare Other | Admitting: Physical Therapy

## 2014-07-01 ENCOUNTER — Inpatient Hospital Stay (HOSPITAL_COMMUNITY): Payer: Medicare Other

## 2014-07-01 ENCOUNTER — Inpatient Hospital Stay (HOSPITAL_COMMUNITY): Payer: Medicare Other | Admitting: Speech Pathology

## 2014-07-01 LAB — GLUCOSE, CAPILLARY
GLUCOSE-CAPILLARY: 99 mg/dL (ref 70–99)
GLUCOSE-CAPILLARY: 128 mg/dL — AB (ref 70–99)
Glucose-Capillary: 100 mg/dL — ABNORMAL HIGH (ref 70–99)
Glucose-Capillary: 103 mg/dL — ABNORMAL HIGH (ref 70–99)

## 2014-07-01 NOTE — Progress Notes (Signed)
Speech Language Pathology Daily Session Note  Patient Details  Name: Cleotilde NeerJames Capasso MRN: [id number] Date of Birth: February 10, 1956  Today's Date: 07/01/2014 SLP Individual Time: 0905-1005 SLP Individual Time Calculation (min): 60 min  Short Term Goals: Week 1: SLP Short Term Goal 1 (Week 1): STGs = LTGs  Skilled Therapeutic Interventions:  Pt was seen for skilled ST targeting goals for speech intelligibility and cognition.  Upon arrival, pt was reclined in bed, awake, alert, and agreeable to participate in ST.  SLP assisted pt in transfer from bed to wheelchair to maximize alertness and attention during structured therapeutic activities in a moderately distracting environment.  During functional conversations with SLP, pt recalled at least 1 dysarthria strategy (slow rate) to improve speech intelligibility across all contexts (i.e. Noisy, distracting environments, with unfamiliar communication partners) with supervision question cues.  SLP facilitated the session with a basic card game targeting functional problem solving, attention, and error awareness.  During the abovementioned task, pt required overall max faded to mod assist verbal cues in a highly structured and repetitive context.   Skilled observations completed during the task revealed that pt was better at identifying matches between cards versus generating them.  Pt also benefited from cues for working memory and the implementation of written visual aids to facilitate carryover within the task. Continue per current plan of care.   FIM:  Comprehension Comprehension Mode: Auditory Comprehension: 5-Understands basic 90% of the time/requires cueing < 10% of the time Expression Expression Mode: Verbal Expression: 4-Expresses basic 75 - 89% of the time/requires cueing 10 - 24% of the time. Needs helper to occlude trach/needs to repeat words. Social Interaction Social Interaction: 4-Interacts appropriately 75 - 89% of the time - Needs  redirection for appropriate language or to initiate interaction. Problem Solving Problem Solving: 3-Solves basic 50 - 74% of the time/requires cueing 25 - 49% of the time Memory Memory: 3-Recognizes or recalls 50 - 74% of the time/requires cueing 25 - 49% of the time  Pain Pain Assessment Pain Assessment: No/denies pain  Therapy/Group: Individual Therapy  Kaileigh Viswanathan, Melanee SpryNicole L 07/01/2014, 9:55 AM

## 2014-07-01 NOTE — Plan of Care (Signed)
Problem: RH BLADDER ELIMINATION Goal: RH STG MANAGE BLADDER WITH ASSISTANCE STG Manage Bladder With Min Assistance  Outcome: Completed/Met Date Met:  07/01/14

## 2014-07-01 NOTE — Progress Notes (Signed)
Physical Therapy Session Note  Patient Details  Name: Cleotilde NeerJames Tworek MRN: [id number] Date of Birth: 07-03-1956  Today's Date: 07/01/2014 PT Individual Time: 1111-1157 PT Individual Time Calculation (min): 46 min   Short Term Goals: Week 1:  PT Short Term Goal 1 (Week 1): = LTGs due to ELOS  Skilled Therapeutic Interventions/Progress Updates:   Pt received sleeping in recliner, easily aroused to voice. Gait training using RW x > 150 ft with supervision and verbal cues for increased gait speed as pt demo improved balance and gait mechanics with increased speed. Pt trialled rollator to assist with increased gait speed with supervision and verbal cues for locking/unlocking brakes and hand placement with sit <> stand. Pt reports subjective improvement in ambulation and demo increased gait speed/fluidity of gait. Pt performed furniture transfers to low compliant couch surface with S-min A and performed floor transfer x 2 with min A to turn around in standing on compliant floor mat and supervision to perform actual transfer, verbal cues for sequencing. Pt performed NMR to promote increased weight shifting to R for improved L foot clearance by kicking yoga block with LLE while ambulating and therapist facilitating weight shift to R. Gait training back to room with rollator x > 150 ft with verbal/tactile cues for weight shift to R and overall supervision. Pt left sitting in recliner with quick release belt on and all needs within reach.   Therapy Documentation Precautions:  Precautions Precautions: Fall Precaution Comments: s/p fall at home with dizziness and unsteady gait Restrictions Weight Bearing Restrictions: No Pain: Pain Assessment Pain Assessment: No/denies pain Pain Score: 0-No pain  See FIM for current functional status  Therapy/Group: Individual Therapy  Kerney ElbeVarner, Kelis Plasse A 07/01/2014, 12:06 PM

## 2014-07-01 NOTE — Progress Notes (Signed)
58 y.o.right handed male  Admitted 06/23/2014 with increased left-sided weakness, slurred speech and blurred vision . 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    Subjective/Complaints: Feels ok, no new issues Has sister that can be there at least part of the day  Review of Systems - limited verbal output Objective: Vital Signs: Blood pressure 119/70, pulse 49, temperature 98 F (36.7 C), temperature source Oral, resp. rate 18, height 5\' 10"  (1.778 m), weight 72.031 kg (158 lb 12.8 oz), SpO2 100 %. No results found. Results for orders placed or performed during the hospital encounter of 06/26/14 (from the past 72 hour(s))  Glucose, capillary     Status: Abnormal   Collection Time: 06/28/14 11:22 AM  Result Value Ref Range   Glucose-Capillary 107 (H) 70 - 99 mg/dL   Comment 1 Notify RN   Glucose, capillary     Status: Abnormal   Collection Time: 06/28/14  4:20 PM  Result Value Ref Range   Glucose-Capillary 137 (H) 70 - 99 mg/dL   Comment 1 Notify RN   Glucose, capillary     Status: Abnormal   Collection Time: 06/28/14  9:00 PM  Result Value Ref Range   Glucose-Capillary 151 (H) 70 - 99 mg/dL  Glucose, capillary     Status: Abnormal   Collection Time: 06/29/14  6:42 AM  Result Value Ref Range   Glucose-Capillary 109 (H) 70 - 99 mg/dL  Glucose, capillary     Status: Abnormal   Collection Time: 06/29/14 11:24 AM  Result Value Ref Range   Glucose-Capillary 100 (H) 70 - 99 mg/dL   Comment 1 Notify RN   Glucose, capillary     Status: Abnormal   Collection Time: 06/29/14  4:30 PM  Result Value Ref Range   Glucose-Capillary 110 (H) 70 - 99 mg/dL   Comment 1 Notify RN   Glucose, capillary     Status: Abnormal   Collection Time: 06/29/14  9:12 PM  Result Value Ref Range   Glucose-Capillary 139 (H) 70 - 99 mg/dL  Glucose, capillary     Status: None   Collection Time: 06/30/14  6:39 AM  Result Value Ref Range   Glucose-Capillary 95 70 - 99 mg/dL  Glucose, capillary     Status: Abnormal   Collection Time: 06/30/14 11:33 AM  Result Value Ref Range   Glucose-Capillary 101 (H) 70 - 99 mg/dL  Glucose, capillary     Status: Abnormal   Collection Time: 06/30/14  4:51 PM  Result Value Ref Range   Glucose-Capillary 105 (H) 70 - 99 mg/dL  Glucose, capillary     Status: Abnormal   Collection Time: 06/30/14  8:33 PM  Result Value Ref Range   Glucose-Capillary 186 (H) 70 - 99 mg/dL   Comment 1 Notify RN   Glucose, capillary     Status: Abnormal   Collection Time: 07/01/14  6:21 AM  Result Value Ref Range   Glucose-Capillary 100 (H) 70 - 99 mg/dL   Comment 1 Notify RN      HEENT: normal Cardio: RRR Resp: CTA B/L GI: BS positive and non distended Extremity:  No Edema Skin:   Intact [facility]: [facility], stroke but not date, Abnormal Motor 4/5 in BUE and BLE and Abnormal FMC Ataxic/ dec FMC, Dysdiadochokinesis LUE, mild dysmetria    Musc/Skel:  Normal GEN NAD Oriented to person place, not day or date or month  Assessment/Plan: 1. 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 Physiatrist is providing close team supervision and 24 hour management of active medical problems listed below. Physiatrist and rehab team continue to assess barriers to discharge/monitor patient progress toward functional and medical goals. FIM: FIM - Bathing Bathing Steps Patient Completed: Chest, Right Arm, Left Arm, Abdomen, Front perineal area, Buttocks, Right upper leg, Left upper leg (stood for bathing today at sink today for the session) Bathing: 5: Supervision: Safety issues/verbal cues  FIM - Upper Body Dressing/Undressing Upper body dressing/undressing steps patient completed: Thread/unthread left bra strap,  Thread/unthread left sleeve of pullover shirt/dress Upper body dressing/undressing: 0: Wears gown/pajamas-no public clothing FIM - Lower Body Dressing/Undressing Lower body dressing/undressing steps patient completed: Don/Doff right sock, Don/Doff left sock Lower body dressing/undressing: 0: Wears gown/pajamas-no public clothing  FIM - Toileting Toileting: 0: Activity did not occur  FIM - Diplomatic Services operational officerToilet Transfers Toilet Transfers Assistive Devices: Elevated toilet seat, Grab bars Toilet Transfers: 0-Activity did not occur  FIM - Games developerBed/Chair Transfer Bed/Chair Transfer: 5: Supine > Sit: Supervision (verbal cues/safety issues), 4: Bed > Chair or W/C: Min A (steadying Pt. > 75%), 4: Chair or W/C > Bed: Min A (steadying Pt. > 75%)  FIM - Locomotion: Wheelchair Distance: 150 Locomotion: Wheelchair: 4: Travels 150 ft or more: maneuvers on rugs and over door sillls with minimal assistance (Pt.>75%) FIM - Locomotion: Ambulation Locomotion: Ambulation Assistive Devices: Designer, industrial/productWalker - Rolling Ambulation/Gait Assistance: 4: Min assist, 4: Min guard Locomotion: Ambulation: 2: Travels 50 - 149 ft with minimal assistance (Pt.>75%)  Comprehension Comprehension Mode: Auditory Comprehension: 5-Understands basic 90% of the time/requires cueing < 10% of the time  Expression Expression Mode: Verbal Expression: 3-Expresses basic 50 - 74% of the time/requires cueing 25 - 50% of the time. Needs to repeat parts of sentences.  Social Interaction Social Interaction: 4-Interacts appropriately 75 - 89% of the time - Needs redirection for appropriate language or to initiate interaction.  Problem Solving Problem Solving: 3-Solves basic 50 - 74% of the time/requires cueing 25 - 49% of the time  Memory Memory: 3-Recognizes or recalls 50 - 74% of the time/requires cueing 25 - 49% of the time  Medical Problem List and Plan: 1. 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  DVT Prophylaxis/Anticoagulation: SQ heparin.Monitor platelet counts and any signs of bleeding 3. Pain Management: tylenol as needed 4. Hypertension.Lisinopril 10 mg daily.Monitor with increased mobility 5. Neuropsych: This patient is capable of making decisions on his own behalf. 6. Skin/Wound Care: routine skin checks 7. Fluids/Electrolytes/Nutrition: strict I&O,s  Follow up labs 8.Hyperlipidemia. Lipitor 40 mg daily 9.Diabetes mellitus with peripheral neuropathy. HGB A1c 6.4.Check blood sugars AC&HS.Patient on Glucophage 1500 mg Qam and 500 mg pm prior to admission.Resume as needed 10 Mood/Schizophrenia.Patient on haldol and invega in the past but never refilled med for quite some time and never restarted 11.UDS positive marijuana .Provide counseling  LOS (Days) 5 A FACE TO FACE EVALUATION WAS PERFORMED  KIRSTEINS,ANDREW E 07/01/2014, 8:19 AM

## 2014-07-01 NOTE — Progress Notes (Signed)
Occupational Therapy Session Note  Patient Details  Name: [name] Williams MRN: [id number] Date of Birth: 1956-03-23  Today's Date: 07/01/2014 OT Individual Time: 1300-1330 OT Calculated Individual Time (min): 30 min   Short Term Goals: Week 1:  OT Short Term Goal 1 (Week 1): STGs= LTGs set a supervision to modified independent level  Skilled Therapeutic Interventions/Progress Updates:  Skilled OT session focusing on functional ambulation, oculomotor exercises, visual scanning, and activity tolerance/endurance. Pt seated in recliner when arriving, reporting no pain. Pt ambulated through hallway to dayroom with Rolator and completed visual scanning and oculomotor control activity of identify objects on wall while walking by them. Pt needing to stop 50% of time in order to read or identify object. Pt sat in chair in dayroom and completed oculomotor exercises, including tracking objects with both eyes and then with individual eyes as opposite eye was occluded. Noted visual deficits with L visual field when L eye was occluded and with R inferior visual field when L eye occluded. Nystagmus in L eye noted periodically with lateral beats to L, could be corrected when pt occluded R eye or closed both eyes and focused on object directly in front of him. Pt ambulated back to room with Rolator, bumping into object on R side (located in R inferior visual field) on the way. Pt seated in recliner with safety belt applied and all needs nearby when leaving.  Therapy Documentation Precautions:  Precautions Precautions: Fall Precaution Comments: s/p fall at home with dizziness and unsteady gait Restrictions Weight Bearing Restrictions: No  See FIM for current functional status  Therapy/Group: Individual Therapy  [name] Williams 07/01/2014, 12:04 PM

## 2014-07-01 NOTE — Plan of Care (Signed)
Problem: RH BLADDER ELIMINATION Goal: RH STG MANAGE BLADDER WITH ASSISTANCE STG Manage Bladder With Min Assistance  Outcome: Progressing  Problem: RH SAFETY Goal: RH STG ADHERE TO SAFETY PRECAUTIONS W/ASSISTANCE/DEVICE STG Adhere to Safety Precautions With Min Assistance/Device.  Outcome: Progressing Need frequent reminders to call for assistance when trying to get out of bed, can be impulsive  Problem: RH COGNITION-NURSING Goal: RH STG USES MEMORY AIDS/STRATEGIES W/ASSIST TO PROBLEM SOLVE STG Uses Memory Aids/Strategies With Mod Assistance to Problem Solve.  Outcome: Progressing

## 2014-07-01 NOTE — Progress Notes (Signed)
Occupational Therapy Session Note  Patient Details  Name: [name] Williams MRN: [id number] Date of Birth: 20-May-1956  Today's Date: 07/01/2014 OT Individual Time: 1020-1105 OT Individual Time Calculation (min): 45 min    Short Term Goals: Week 1:  OT Short Term Goal 1 (Week 1): STGs= LTGs set a supervision to modified independent level  Skilled Therapeutic Interventions/Progress Updates:    Pt seen for ADL retraining with focus on visual scanning, LUE coordination, functional mobility, and standing balance. Pt received sitting in recliner chair. Ambulated to bathroom with min-mod HHA and cues to prevent furniture walking. Completed bathing at shower level with SBA for standing balance and use of grab bars. Pt required min cues for problem solving to manage shower head and use of wash cloth. Completed dressing sitting in recliner with steadying assist for standing balance to manage clothing around waist. Engaged in LUE coordination activity of flipping cards and placing back into box with pt requiring increased time to complete tasks. Pt left sitting in recliner chair with all needs in reach.   Therapy Documentation Precautions:  Precautions Precautions: Fall Precaution Comments: s/p fall at home with dizziness and unsteady gait Restrictions Weight Bearing Restrictions: No General:   Vital Signs: Therapy Vitals Pulse Rate: (!) 58 BP: 116/79 mmHg Patient Position (if appropriate): Sitting Pain: Pain Assessment Pain Assessment: No/denies pain  See FIM for current functional status  Therapy/Group: Individual Therapy  Daneil Danerkinson, Dmarco Baldus N 07/01/2014, 12:31 PM

## 2014-07-02 ENCOUNTER — Inpatient Hospital Stay (HOSPITAL_COMMUNITY): Payer: Medicare Other

## 2014-07-02 ENCOUNTER — Inpatient Hospital Stay (HOSPITAL_COMMUNITY): Payer: Medicare Other | Admitting: Speech Pathology

## 2014-07-02 ENCOUNTER — Inpatient Hospital Stay (HOSPITAL_COMMUNITY): Payer: Medicare Other | Admitting: Physical Therapy

## 2014-07-02 LAB — GLUCOSE, CAPILLARY
GLUCOSE-CAPILLARY: 111 mg/dL — AB (ref 70–99)
GLUCOSE-CAPILLARY: 114 mg/dL — AB (ref 70–99)
Glucose-Capillary: 149 mg/dL — ABNORMAL HIGH (ref 70–99)
Glucose-Capillary: 172 mg/dL — ABNORMAL HIGH (ref 70–99)

## 2014-07-02 NOTE — Progress Notes (Addendum)
Occupational Therapy Session Note  Patient Details  Name: Cleotilde NeerJames Moodie MRN: [id number] Date of Birth: 24-Sep-1955  Today's Date: 07/02/2014 OT Individual Time: 1030-1120 OT Individual Time Calculation (min): 50 min    Short Term Goals: Week 1:  OT Short Term Goal 1 (Week 1): STGs= LTGs set a supervision to modified independent level  Skilled Therapeutic Interventions/Progress Updates: ADL-retraining with emphasis on safety awareness, dynamic standing balance, and endurance.   Pt received seated in recliner, awake and alert, awaiting therapist.  Pt with flat affect throughout session, speech mildly slurred, yet compliant and cooperative with directions.  No street clothing available this session; pt bathed at shower-level using bench but standing unsupported to wash buttocks and periarea with standby assist for safety.   Pt completed bath and donned gown, ambulating to recover at recliner at end and applied lotion to his feet.   Pt applied lotion independently with extra time although with mild incoordination with left UE (over-shooting) evident during task.   Pt required setup to don TEDs and total assist to don socks d/t fatigue.   OT noted pt became less responsive, lethargic, and had difficulty maintaining eye contact with therapist after applying lotion.   RN was notified of change in status and BP was assessed as within limits.    Pt then requested bed rest and was assisted to bed, HOB elevated to 20 deg.    Pt's sister arrived and requested update on d/c plan, etc., and was alerted to plan for d/c 11/20 as of this date.     Therapy Documentation Precautions:  Precautions Precautions: Fall Precaution Comments: s/p fall at home with dizziness and unsteady gait Restrictions Weight Bearing Restrictions: No  General: General OT Amount of Missed Time: 10 Minutes  Vital Signs: Therapy Vitals Pulse Rate: 92 BP: 118/68 mmHg Patient Position (if appropriate): Sitting Oxygen  Therapy SpO2: 100 % O2 Device: Not Delivered  Pain: Pain Assessment Pain Assessment: No/denies pain  See FIM for current functional status  Therapy/Group: Individual Therapy   Second session: Time: 1415-1430 Time Calculation (min):  15 min  Pain Assessment: No/denies pain  Skilled Therapeutic Interventions: Therapeutic activity with focus on functional mobility using RW, safety awareness, transfers, and bed mobility.   Pt received supine in bed, awake and alert, receptive for make-up session.   Pt was motivated to demonstrate his ability to ambulate using 4-wheeled walker (4-ww).  With min guard assist, verbal cues to lift right foot (d/t drag and scissoring gait), pt ambulated 175', rested for 2 min, and continued back to his room 150'.   Pt required vc to perform sit>stand at 4-ww d/t habitually unlocking brakes prior to standing.   Pt returned to his room to sit in recline at end of session.   Safety belt affixed and all needs placed within reach.  See FIM for current functional status  Therapy/Group: Individual Therapy  Sigrid Schwebach 07/02/2014, 11:40 AM

## 2014-07-02 NOTE — Progress Notes (Addendum)
Social Work Assessment and Plan  Patient Details  Name: [name] Williams MRN: [id number] Date of Birth: Mar 25, 1956  Today's Date: 07/02/2014  Problem List:  Patient Active Problem List   Diagnosis Date Noted  . 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 CVA (cerebral infarction) 06/26/2014  . 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 Sensory deficit, left 06/26/2014  . 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 Diplopia   . Acute CVA (cerebrovascular accident) 06/23/2014  . Stroke-like symptom 06/23/2014  . Preventative health care 08/29/2013  . History of CVA (cerebrovascular accident) 12/01/2009  . HLD (hyperlipidemia) 04/08/2008  . DYSLIPIDEMIA 04/08/2008  . TOBACCO ABUSE 04/08/2008   Past Medical History:  Past Medical History  Diagnosis Date  . Diabetes mellitus   . Hypertension   . History of stroke    Past Surgical History: History reviewed. No pertinent past surgical history. Social History:  reports that he has been smoking Cigarettes.  He has a 10 pack-year smoking history. He does not have any smokeless tobacco history on file. He reports that he uses illicit drugs. He reports that he does not drink alcohol.  Family / Support Systems Marital Status: Separated Patient Roles: Other (Comment) (sibling) Other Supports: Clara - another sister Anticipated Caregiver: [name] Vidales Ability/Limitations of Caregiver: [name] works until 3 pm daily and will be with pt after that time.  As of 07-01-14, [name] does not have 24/7 supervision for pt, as Clara will not be with pt consistently. Caregiver Availability: Other (Comment) (Pt does not have 24/7 supervision at this time.) Family Dynamics: Pt is close with [name].  Relationship with Clara is unknown at this time.  Social History Preferred language: English Religion: Non-Denominational Education: high school Read: Yes Write: Yes Employment Status: Disabled Date Retired/Disabled/Unemployed: several years ago after CVA Special educational needs teacher Issues: None reported Guardian/Conservator: None reported   Abuse/Neglect Physical Abuse: Denies Verbal Abuse: Denies Sexual Abuse: Denies Exploitation of patient/patient's resources: Denies Self-Neglect: Denies  Emotional Status Pt's affect, behavior and adjustment status: Pt answered CSW's questions and gave good eye contact.  He reports feeling well emotionally and is accepting of need for rehab before going home. Recent Psychosocial Issues: None reported Psychiatric History: Per [name], pt was diagnosed with schizophrenia 30 years ago and goes to [facility] monthly to receive a shot.  He does well on this regime of medication. Substance Abuse History: None reported by pt during [facility] visit, but his medical chart reported he was positive for marijuana upon admission to the hospital.  Patient / Family Perceptions, Expectations & Goals Pt/Family understanding of illness & functional limitations: Pt/sister feel they have received necessary information to understand pt's condition. Premorbid pt/family roles/activities: Pt enjoys watching TV, listening to music, and walking to the store a few times a week. Anticipated changes in roles/activities/participation: Pt plans to resume his activiites when he goes home.  He realizes he may not be able to walk to the store right away, however. Pt/family expectations/goals: Pt would like to get stronger and steadier on his feet before going home.  Community Resources [employer]: Other (Comment) Consulting civil engineer) Premorbid Home Care/DME Agencies: Other (Comment) ([facility] in the past) Transportation available at discharge: sister  Discharge Planning Living Arrangements: Other relatives Support Systems: Other relatives Type of Residence: Private residence Insurance Resources: [employer] Financial Resources: [place], Family Support Financial Screen Referred: No Living Expenses: Lives with family Money Management:  Patient, Family Does the patient have any problems obtaining your medications?: No Home Management: Pt's sister takes care of  this. Patient/Family Preliminary Plans: Pt plans to return to his sister's home where he lives.  Sister is trying to arrange 24/7 supervision for pt, but at this time, she does not have this arranged. Barriers to Discharge: Steps Social Work Anticipated Follow Up Needs: HH/OP Expected length of stay: 7 to 9 days  Clinical Impression CSW met with pt and then later spoke with his sister [name]) via telephone to introduce self and role of CSW, as well as complete assessment.  Pt and sister were both pleasant with CSW, but sister does not yet have 24/7 supervision for pt.  Sister talked about pt's primary care appt being this week and so CSW canceled and rescheduled this for pt.  Sister also takes pt to [facility] monthly for a shot for schizophrenia.  Pt has another sister, but [name] is unsure if Clara will commit to being there while [name] is at work.  CSW will update therapists of this and see what needs pt will have for "during the work day" supervision and will relay this information to [place].  CSW made sister aware of short length of stay and that he will most likely be discharged this week.  CSW to continue to follow pt ans keep sister updated.  Rhett Mutschler, Silvestre Mesi 07/02/2014, 11:50 AM

## 2014-07-02 NOTE — Plan of Care (Signed)
Problem: RH SAFETY Goal: RH STG ADHERE TO SAFETY PRECAUTIONS W/ASSISTANCE/DEVICE STG Adhere to Safety Precautions With Min Assistance/Device.  Outcome: Not Progressing Patient can be impulsive at times

## 2014-07-02 NOTE — Progress Notes (Signed)
[facility] Individual Statement of Services  Patient Name:  [name] Williams  Date:  07/02/2014  Welcome to the [facility].  Our goal is to provide you with an individualized program based on your diagnosis and situation, designed to meet your specific needs.  With this comprehensive rehabilitation program, you will be expected to participate in at least 3 hours of rehabilitation therapies Monday-Friday, with modified therapy programming on the weekends.  Your rehabilitation program will include the following services:  Physical Therapy (PT), Occupational Therapy (OT), Speech Therapy (ST), 24 hour per day rehabilitation nursing, Case Management (Social Worker), Rehabilitation Medicine, Nutrition Services and Pharmacy Services  Weekly team conferences will be held on Wednesdays to discuss your progress.  Your Social Worker will talk with you frequently to get your input and to update you on team discussions.  Team conferences with you and your family in attendance may also be held.  Expected length of stay:   7 to 9 days  Overall anticipated outcome:   Supervision  Depending on your progress and recovery, your program may change. Your Social Worker will coordinate services and will keep you informed of any changes. Your Social Worker's name and contact numbers are listed  below.  The following services may also be recommended but are not provided by the [facility]:   Driving Evaluations  Home Health Rehabiltiation Services  Outpatient Rehabilitation Services   Arrangements will be made to provide these services after discharge if needed.  Arrangements include referral to agencies that provide these services.  Your insurance has been verified to be:  Medicare Your primary doctor is:  Dr. Gara Kroneriana Truong  Pertinent information will be shared with your doctor and your insurance company.  Social Worker:  Staci AcostaJenny Aeden Matranga, LCSW  [phone]  [phone] or ([phone]) [phone]  Information discussed with and copy given to patient by: Elvera LennoxPrevatt, Mariell Nester Capps, 07/02/2014, 11:06 AM

## 2014-07-02 NOTE — Progress Notes (Signed)
58 y.o.right handed male  Admitted 06/23/2014 with increased left-sided weakness, slurred speech and blurred vision . 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    Subjective/Complaints: Patient would like to go home this week. States he has a sister that can help him at home but only about 4 hours a day  Review of Systems - limited verbal output Objective: Vital Signs: Blood pressure 133/83, pulse 64, temperature 97.5 F (36.4 C), temperature source Oral, resp. rate 18, height 5\' 10"  (1.778 m), weight 72.031 kg (158 lb 12.8 oz), SpO2 99 %. No results found. Results for orders placed or performed during the hospital encounter of 06/26/14 (from the past 72 hour(s))  Glucose, capillary     Status: Abnormal   Collection Time: 06/29/14 11:24 AM  Result Value Ref Range   Glucose-Capillary 100 (H) 70 - 99 mg/dL   Comment 1 Notify RN   Glucose, capillary     Status: Abnormal   Collection Time: 06/29/14  4:30 PM  Result Value Ref Range   Glucose-Capillary 110 (H) 70 - 99 mg/dL   Comment 1 Notify RN   Glucose, capillary     Status: Abnormal   Collection Time: 06/29/14  9:12 PM  Result Value Ref Range   Glucose-Capillary 139 (H) 70 - 99 mg/dL  Glucose, capillary     Status: None   Collection Time: 06/30/14  6:39 AM  Result Value Ref Range   Glucose-Capillary 95 70 - 99 mg/dL  Glucose, capillary     Status: Abnormal   Collection Time: 06/30/14 11:33 AM  Result Value Ref Range   Glucose-Capillary 101 (H) 70 - 99 mg/dL  Glucose, capillary     Status: Abnormal   Collection Time: 06/30/14  4:51 PM  Result Value Ref Range   Glucose-Capillary 105 (H) 70 - 99 mg/dL  Glucose, capillary     Status: Abnormal   Collection Time: 06/30/14  8:33 PM  Result Value Ref Range   Glucose-Capillary 186 (H) 70 - 99 mg/dL   Comment 1 Notify RN   Glucose, capillary     Status: Abnormal   Collection Time: 07/01/14  6:21 AM  Result Value Ref Range    Glucose-Capillary 100 (H) 70 - 99 mg/dL   Comment 1 Notify RN   Glucose, capillary     Status: None   Collection Time: 07/01/14 12:00 PM  Result Value Ref Range   Glucose-Capillary 99 70 - 99 mg/dL  Glucose, capillary     Status: Abnormal   Collection Time: 07/01/14  4:34 PM  Result Value Ref Range   Glucose-Capillary 128 (H) 70 - 99 mg/dL  Glucose, capillary     Status: Abnormal   Collection Time: 07/01/14  8:28 PM  Result Value Ref Range   Glucose-Capillary 103 (H) 70 - 99 mg/dL  Glucose, capillary     Status: Abnormal   Collection Time: 07/02/14  6:43 AM  Result Value Ref Range   Glucose-Capillary 111 (H) 70 - 99 mg/dL     HEENT: normal Cardio: RRR Resp: CTA B/L GI: BS positive and non distended Extremity:  No Edema Skin:   Intact [facility], stroke but not date, Abnormal Motor 4/5 in BUE and BLE and Abnormal FMC Ataxic/ dec FMC, Dysdiadochokinesis LUE, mild dysmetria    Musc/Skel:  Normal GEN NAD Oriented to person place, remembers me  Assessment/Plan: 1. 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 Physiatrist is providing close team supervision and 24 hour management of active medical problems listed below. Physiatrist and rehab team continue to assess barriers to discharge/monitor patient progress toward functional and medical goals.  Care team meeting in a.m. To determine discharge date FIM: FIM - Bathing Bathing Steps Patient Completed: Chest, Right Arm, Left Arm, Abdomen, Front perineal area, Buttocks, Right upper leg, Left upper leg Bathing: 4: Min-Patient completes [id number] [id number] 10 parts or 75+ percent  FIM - Upper Body Dressing/Undressing Upper body dressing/undressing steps patient completed: Thread/unthread left sleeve of pullover  shirt/dress Upper body dressing/undressing: 0: Wears gown/pajamas-no public clothing FIM - Lower Body Dressing/Undressing Lower body dressing/undressing steps patient completed: Don/Doff right sock, Don/Doff left sock, Thread/unthread right underwear leg, Pull underwear up/down, Thread/unthread left underwear leg Lower body dressing/undressing: 4: Steadying Assist  FIM - Toileting Toileting: 0: Activity did not occur  FIM - Diplomatic Services operational officerToilet Transfers Toilet Transfers Assistive Devices: Elevated toilet seat, Grab bars Toilet Transfers: 0-Activity did not occur  FIM - Games developerBed/Chair Transfer Bed/Chair Transfer: 5: Sit > Supine: Supervision (verbal cues/safety issues), 5: Supine > Sit: Supervision (verbal cues/safety issues), 5: Bed > Chair or W/C: Supervision (verbal cues/safety issues), 5: Chair or W/C > Bed: Supervision (verbal cues/safety issues)  FIM - Locomotion: Wheelchair Distance: 150 Locomotion: Wheelchair: 0: Activity did not occur FIM - Locomotion: Ambulation Locomotion: Ambulation Assistive Devices: Designer, industrial/productWalker - Rolling, Other (comment) [phone]([phone]) Ambulation/Gait Assistance: 5: Supervision Locomotion: Ambulation: 5: Travels 150 ft or more with supervision/safety issues  Comprehension Comprehension Mode: Auditory Comprehension: 5-Understands basic 90% of the time/requires cueing < 10% of the time  Expression Expression Mode: Verbal Expression: 4-Expresses basic 75 - 89% of the time/requires cueing 10 - 24% of the time. Needs helper to occlude trach/needs to repeat words.  Social Interaction Social Interaction: 4-Interacts appropriately 75 - 89% of the time - Needs redirection for appropriate language or to initiate interaction.  Problem Solving Problem Solving: 3-Solves basic 50 - 74% of the time/requires cueing 25 - 49% of the time  Memory Memory: 3-Recognizes or recalls 50 - 74% of the time/requires cueing 25 - 49% of the time  Medical Problem List and Plan: 1. 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  DVT Prophylaxis/Anticoagulation: SQ heparin.Monitor platelet counts and any signs of bleeding 3. Pain Management: tylenol as needed 4. Hypertension.Lisinopril 10 mg daily.Monitor with increased mobility 5. Neuropsych: This patient is capable of making decisions on his own behalf. 6. Skin/Wound Care: routine skin checks 7. Fluids/Electrolytes/Nutrition: strict I&O,s  Follow up labs 8.Hyperlipidemia. Lipitor 40 mg daily 9.Diabetes mellitus with peripheral neuropathy. HGB A1c 6.4.Check blood sugars AC&HS.Patient on Glucophage 1500 mg Qam and 500 mg pm prior to admission.Resume as needed 10 Mood/Schizophrenia.Patient on haldol and invega in the past but never refilled med for quite some time and never restarted, no evidence of psychosis or abnormal affect 11.UDS positive marijuana .Provide counseling  LOS (Days) 6 A FACE TO FACE EVALUATION WAS PERFORMED  Sumiye Hirth E 07/02/2014, 8:57 AM

## 2014-07-02 NOTE — Progress Notes (Signed)
Physical Therapy Session Note  Patient Details  Name: [name] Williams MRN: [id number] Date of Birth: 1956-06-28  Today's Date: 07/02/2014 PT Individual Time: 1500-1615 PT Individual Time Calculation (min): 75 min   Short Term Goals: Week 1:  PT Short Term Goal 1 (Week 1): = LTGs due to ELOS  Skilled Therapeutic Interventions/Progress Updates:   Pt received sitting in recliner with quick release belt on, noted to be incontinent of urine but patient unaware. Pt donned clean gown and ambulated to therapy gym using rollator with supervision and 1 LOB to left when turning but pt able to recover with min A. Patient performed cognitive remediation for path finding task with focus on visual scanning to ambulate to/from hospital gift shop using rollator at supervision level in controlled and community environments, 2 x > 500 ft with 1 seated rest break. Pt requires mod-max cues to utilize signs/maps to find gift shop as he tends to just wander without attending to environment. Pt given memory challenge of 2 items to locate in gift shop. Pt able to recall 2/2 items but needed cuing to look for items and max cues to visually scan environment in order to locate them. With questioning, pt reporting dizziness with head turns during gait that resolves quickly with gaze stabilization. Upon returning to therapy gym, pt performed NMR for dynamic balance and weight shifting to R with picking horseshoes up from ground and placing on high basketball rim outside BOS to the R. Pt then reached up and to R to remove horseshoes and rotate to L to place on low mat. Pt able to perform with supervision overall and min-mod A to recover with multiple LOB. Pt dribbled basketball back to room x > 150 ft at supervision level with verbal cues for safety. Sister in room upon return and educated on pt progress and recommendation for 24/7 S. Sister reports pt will not have 24/7 S but she is looking to see if someone can stay with him while  she is at work. Pt's sister reports that vision deficits are new to this admission and at baseline pt was able to go out in community independently. Pt left sitting in recliner with quick release belt on and pt's sister present.   Therapy Documentation Precautions:  Precautions Precautions: Fall Precaution Comments: s/p fall at home with dizziness and unsteady gait Restrictions Weight Bearing Restrictions: No Pain: Pain Assessment Pain Assessment: No/denies pain  See FIM for current functional status  Therapy/Group: Individual Therapy  Kerney ElbeVarner, [name] A 07/02/2014, 4:23 PM

## 2014-07-02 NOTE — Progress Notes (Signed)
Speech Language Pathology Daily Session Note  Patient Details  Name: [name] Williams MRN: [id number] Date of Birth: 10-01-1955  Today's Date: 07/02/2014 SLP Individual Time: 0902-1002 SLP Individual Time Calculation (min): 60 min  Short Term Goals: Week 1: SLP Short Term Goal 1 (Week 1): STGs = LTGs  Skilled Therapeutic Interventions:  Pt was seen for skilled ST targeting cognitive goals.  Upon arrival, pt was awake, alert, and agreeable to participate in ST with encouragement.  SLP facilitated the session with continued practice of executive function skills for planning, thought organization, and error awareness via card game targeted during last therapy session.  Pt recalled 4 components of the game from ~24 hours ago with min-mod question cues.  SLP provided mod faded to min assist multimodal cuing and use of written aid to facilitate improved working memory, planning, and organization during the abovementioned task, which indicates an improvement from max assist which was needed during previous therapy session.  Pt continues to present with decreased awareness of errors; however, he was noted to self monitor and correct errors with supervision approximately 3 times over the course of the task (~30 min).   SLP also facilitated the session with a structured task targeting functional safety awareness.  Pt required min-mod assist to identify problems in pictures, and then min assist to generate solutions for the targeted problems. Pt is making slow progress towards meeting goals; family meeting/education would be beneficial prior to discharge to determine baseline level of cognitive functioning and make appropriate d/c recommendations given that pt appears to be near his cognitive baseline.  Continue per current plan of care.   FIM:  Comprehension Comprehension Mode: Auditory Comprehension: 5-Understands basic 90% of the time/requires cueing < 10% of the time Expression Expression Mode:  Verbal Expression: 5-Expresses basic 90% of the time/requires cueing < 10% of the time. Social Interaction Social Interaction: 4-Interacts appropriately 75 - 89% of the time - Needs redirection for appropriate language or to initiate interaction. Problem Solving Problem Solving: 4-Solves basic 75 - 89% of the time/requires cueing 10 - 24% of the time Memory Memory: 4-Recognizes or recalls 75 - 89% of the time/requires cueing 10 - 24% of the time  Pain Pain Assessment Pain Assessment: No/denies pain  Therapy/Group: Individual Therapy   Jackalyn LombardNicole Stephenie Navejas, M.A. CCC-SLP  [name] Williams, Melanee SpryNicole L 07/02/2014, 9:36 AM

## 2014-07-02 NOTE — Plan of Care (Signed)
Problem: RH SAFETY Goal: RH STG ADHERE TO SAFETY PRECAUTIONS W/ASSISTANCE/DEVICE STG Adhere to Safety Precautions With Min Assistance/Device.  Outcome: Not Progressing Continue to educate on need to use call bell

## 2014-07-03 ENCOUNTER — Inpatient Hospital Stay (HOSPITAL_COMMUNITY): Payer: Medicare Other | Admitting: *Deleted

## 2014-07-03 ENCOUNTER — Ambulatory Visit: Payer: Medicare Other | Admitting: Internal Medicine

## 2014-07-03 ENCOUNTER — Inpatient Hospital Stay (HOSPITAL_COMMUNITY): Payer: Medicare Other | Admitting: Speech Pathology

## 2014-07-03 ENCOUNTER — Inpatient Hospital Stay (HOSPITAL_COMMUNITY): Payer: Medicare Other | Admitting: Physical Therapy

## 2014-07-03 ENCOUNTER — Encounter (HOSPITAL_COMMUNITY): Payer: Medicare Other | Admitting: Occupational Therapy

## 2014-07-03 LAB — GLUCOSE, CAPILLARY
GLUCOSE-CAPILLARY: 100 mg/dL — AB (ref 70–99)
GLUCOSE-CAPILLARY: 105 mg/dL — AB (ref 70–99)
GLUCOSE-CAPILLARY: 109 mg/dL — AB (ref 70–99)
Glucose-Capillary: 125 mg/dL — ABNORMAL HIGH (ref 70–99)

## 2014-07-03 NOTE — Progress Notes (Signed)
Occupational Therapy Session Note  Patient Details  Name: [name] Williams MRN: [id number] Date of Birth: 10-02-1955  Today's Date: 07/03/2014 OT Individual Time:  -       Short Term Goals: Week 1:  OT Short Term Goal 1 (Week 1): STGs= LTGs set a supervision to modified independent level  Skilled Therapeutic Interventions/Progress Updates:    Pt used the rollator to ambulate to the walk-in shower with supervision.  He was able to wash sit to stand with min guard assist and min instructional cueing for sequencing and safety.  One LOB posteriorly when standing to wash his peri area but he was able to self correct using the grab bar.  Min instructional cueing also needed for pt to sequence drying off as he attempts to stand on a towel and transfer out instead of remembering that the towel needs to be removed before standing up.  Mod instructional cueing needed to recall the need to lock the wheelchair brakes.  He was able to perform all of his grooming in sitting at the sink with supervision and donn his TEDS and gripper socks with supervision as well.  Pt still with no regular clothing from home.   Finished session by working on visual tracking exercises bilaterally and unilaterally.  Pt with decreased occular AROM and tracking in all directions with the right eye.  Issued pt letter "A" to work on tracking in his room.  Will continue with AROM exercises.  Therapy Documentation Precautions:  Precautions Precautions: Fall Precaution Comments: s/p fall at home with dizziness and unsteady gait Restrictions Weight Bearing Restrictions: No  Pain: Pain Assessment Pain Assessment: No/denies pain Pain Score: 0-No pain ADL: See FIM for current functional status  Therapy/Group: Individual Therapy  Bradin Mcadory,[place] OTR/L 07/03/2014, 9:39 AM

## 2014-07-03 NOTE — Progress Notes (Signed)
Physical Therapy Session Note  Patient Details  Name: [name] Williams MRN: [id number] Date of Birth: [birth year]  Today's Date: 07/03/2014 PT Group Time: 1400-1500 PT Group Time Calculation (min): 60 min  Short Term Goals: Week 1:  PT Short Term Goal 1 (Week 1): = LTGs due to ELOS  Skilled Therapeutic Interventions/Progress Updates:  Pt received sitting in recliner, ready for therapy. Pt participated in group therapy session with emphasis on functional endurance during ambulation, dynamic standing balance and therex. Recreational therapist present to assist with group session. Pt req supervision for multiple t/f sit<>stand this session with use of 4WW, min cues for hand placement. Pt req close(S)-min guard A for ambulation 175'2 and 200'x1 with 4WW, occasional cues for posture and pace for overall safety. Pt engaged in 2 rounds of horseshoes to target dynamic balance while standing on compliant surface without UE support and reaching outside BOS to obtain horseshoes. Pt also challenged to pick horseshoes up without B UE support. Overall, pt req min-mod A for balance. Pt with excellent tolerance to NuStep with use of B UE/LE to target strength/endurance, level 3x10 min. Pt left sitting in recliner at end of session w/ all needs in reach and quick release belt in place.   Therapy Documentation Precautions:  Precautions Precautions: Fall Precaution Comments: s/p fall at home with dizziness and unsteady gait Restrictions Weight Bearing Restrictions: No  See FIM for current functional status  Therapy/Group: Group Therapy  Denzil HughesKing, Corynne Scibilia S 07/03/2014, 5:09 PM

## 2014-07-03 NOTE — Progress Notes (Signed)
Social Work Elease Hashimoto, LCSW Social Worker Signed  Patient Care Conference 07/03/2014  1:50 PM    Expand All Collapse All   Inpatient RehabilitationTeam Conference and Plan of Care Update Date: 07/03/2014   Time: 11;55 AM     Patient Name: [name] Williams       Medical Record Number: [id number]  Date of Birth: Nov 01, 1955 Sex: Male         Room/Bed: 4W06C/4W06C-01 Payor Info: Payor: MEDICARE / Plan: MEDICARE PART A AND B / Product Type: *No Product type* /    Admitting Diagnosis: Post circulation  brainstem CVA   Admit Date/Time:  06/26/2014  2:37 PM Admission Comments: No comment available   Primary Diagnosis:  <principal problem not specified> Principal Problem: <principal problem not specified>    Patient Active Problem List     Diagnosis  Date Noted   .  Ataxia S/P CVA  06/30/2014   .  CVA (cerebral infarction)  06/26/2014   .  INO (internuclear ophthalmoplegia)  06/26/2014   .  Sensory deficit, left  06/26/2014   .  Brainstem infarction  06/26/2014   .  Diplopia     .  Acute CVA (cerebrovascular accident)  06/23/2014   .  Stroke-like symptom  06/23/2014   .  Preventative health care  08/29/2013   .  History of CVA (cerebrovascular accident)  12/01/2009   .  HLD (hyperlipidemia)  04/08/2008   .  DYSLIPIDEMIA  04/08/2008   .  TOBACCO ABUSE  04/08/2008     Expected Discharge Date: Expected Discharge Date: 07/05/14  Team Members Present: Physician leading conference: Dr. Alysia Penna Social Worker Present: Ovidio Kin, LCSW Nurse Present: Heather Roberts, RN PT Present: Cameron Sprang, PT;Orland Visconti Jari Favre, PT OT Present: Clyda Greener, Rhetta Mura, OT SLP Present: Gunnar Fusi, SLP PPS Coordinator present : Daiva Nakayama, RN, CRRN        Current Status/Progress  Goal  Weekly Team Focus   Medical     sup level goals  Home d/c  d/c planning   Bowel/Bladder     Continent of bowel and bladder; LBM 11/17   Mod I assist  Continue current regimen; Monitor for s/s  of constipation   Swallow/Nutrition/ Hydration       na         ADL's     Currently min guard assist for bathing, dressing, and toileting tasks.  Still with severe visual scanning and ROM deficits.   supervision level for all selfcare   selfcare retraining, visual exercises, neurmuscular re-education, balance re-training, pt/family education   Mobility     Supervision-min A  Supervision overall   safety, neuromuscular re-education, dynamic balance, gait training, basic/floor transfers, stairs, pt education   Communication     supervision for speech intelligibility    Min assist   goal met    Safety/Cognition/ Behavioral Observations    Impulsive- Patient needs frequent reminders not to get up unassisted.  Mod I   family education prior to d/c to determine follow up ST recommendations, functional problem solving, attention, and safety awareness    Pain     Denies pain  < 3  Assess for pain q shift and prn   Skin     Dry flaky skin; no breakdown or infection noted   Remain free from skin breakdown or infection with mod I assist   Assess skin q shift and prn      *See Care Plan and progress notes for long and  short-term goals.    Barriers to Discharge:  does not have 24/7     Possible Resolutions to Barriers:   caregiver traing     Discharge Planning/Teaching Needs:     Lives with sister who works until 3;00pm, she is currently working on getting someone to stay with him while she works.      Team Discussion:    Pt progressing well in therapies-reaching supervision level-his stated goals. More alert  And affect is improved. Impulsive reason for the supervision for safety.  Sister working on getting someone to stay with him.   Revisions to Treatment Plan:    None    Continued Need for Acute Rehabilitation Level of Care: The patient requires daily medical management by a physician with specialized training in physical medicine and rehabilitation for the following conditions: Daily  direction of a multidisciplinary physical rehabilitation program to ensure safe treatment while eliciting the highest outcome that is of practical value to the patient.: Yes Daily medical management of patient stability for increased activity during participation in an intensive rehabilitation regime.: Yes Daily analysis of laboratory values and/or radiology reports with any subsequent need for medication adjustment of medical intervention for : Neurological problems  Elease Hashimoto 07/03/2014, 1:50 PM                  Patient ID: [name] Williams, male   DOB: [birth year], 58 y.o.   MRN: [id number]

## 2014-07-03 NOTE — Patient Care Conference (Signed)
Inpatient RehabilitationTeam Conference and Plan of Care Update Date: 07/03/2014   Time: 11;55 AM    Patient Name: [name] Williams      Medical Record Number: [id number]  Date of Birth: 1956-05-04 Sex: Male         Room/Bed: 4W06C/4W06C-01 Payor Info: Payor: MEDICARE / Plan: MEDICARE PART A AND B / Product Type: *No Product type* /    Admitting Diagnosis: Post circulation  brainstem CVA   Admit Date/Time:  06/26/2014  2:37 PM Admission Comments: No comment available   Primary Diagnosis:  <principal problem not specified> Principal Problem: <principal problem not specified>  Patient Active Problem List   Diagnosis Date Noted  . 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 CVA (cerebral infarction) 06/26/2014  . 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 Sensory deficit, left 06/26/2014  . 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 Diplopia   . Acute CVA (cerebrovascular accident) 06/23/2014  . Stroke-like symptom 06/23/2014  . Preventative health care 08/29/2013  . History of CVA (cerebrovascular accident) 12/01/2009  . HLD (hyperlipidemia) 04/08/2008  . DYSLIPIDEMIA 04/08/2008  . TOBACCO ABUSE 04/08/2008    Expected Discharge Date: Expected Discharge Date: 07/05/14  Team Members Present: Physician leading conference: Dr. Alysia Penna Social Worker Present: Ovidio Kin, LCSW Nurse Present: Heather Roberts, RN PT Present: Cameron Sprang, PT;Laker Thompson Jari Favre, PT OT Present: Clyda Greener, Rhetta Mura, OT SLP Present: Gunnar Fusi, SLP PPS Coordinator present : Daiva Nakayama, RN, CRRN     Current Status/Progress Goal Weekly Team Focus  Medical   sup level goals  Home d/c  d/c planning   Bowel/Bladder   Continent of bowel and bladder; LBM 11/17  Mod I assist  Continue current regimen; Monitor for s/s of constipation   Swallow/Nutrition/ Hydration     na        ADL's   Currently min guard assist for bathing, dressing, and toileting tasks.  Still with severe visual scanning and ROM  deficits.  supervision level for all selfcare  selfcare retraining, visual exercises, neurmuscular re-education, balance re-training, pt/family education   Mobility   Supervision-min A  Supervision overall   safety, neuromuscular re-education, dynamic balance, gait training, basic/floor transfers, stairs, pt education   Communication   supervision for speech intelligibility   Min assist   goal met    Safety/Cognition/ Behavioral Observations  Impulsive- Patient needs frequent reminders not to get up unassisted.  Mod I   family education prior to d/c to determine follow up ST recommendations, functional problem solving, attention, and safety awareness    Pain   Denies pain  < 3  Assess for pain q shift and prn   Skin   Dry flaky skin; no breakdown or infection noted  Remain free from skin breakdown or infection with mod I assist  Assess skin q shift and prn      *See Care Plan and progress notes for long and short-term goals.  Barriers to Discharge: does not have 24/7    Possible Resolutions to Barriers:  caregiver traing    Discharge Planning/Teaching Needs:    Lives with sister who works until 3;00pm, she is currently working on getting someone to stay with him while she works.     Team Discussion:  Pt progressing well in therapies-reaching supervision level-his stated goals. More alert  And affect is improved. Impulsive reason for the supervision for safety.  Sister working on getting someone to stay with him.  Revisions to Treatment Plan:  None   Continued Need for Acute Rehabilitation  Level of Care: The patient requires daily medical management by a physician with specialized training in physical medicine and rehabilitation for the following conditions: Daily direction of a multidisciplinary physical rehabilitation program to ensure safe treatment while eliciting the highest outcome that is of practical value to the patient.: Yes Daily medical management of patient stability  for increased activity during participation in an intensive rehabilitation regime.: Yes Daily analysis of laboratory values and/or radiology reports with any subsequent need for medication adjustment of medical intervention for : Neurological problems  [name], [name] 07/03/2014, 1:50 PM

## 2014-07-03 NOTE — Progress Notes (Signed)
58 y.o.right handed male  Admitted 06/23/2014 with increased left-sided weakness, slurred speech and blurred vision . 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    Subjective/Complaints: No problems overnight. Patient denies any pain complaints. No breathing problems  Review of Systems - neg except as above Objective: Vital Signs: Blood pressure 116/70, pulse 51, temperature 97.7 F (36.5 C), temperature source Oral, resp. rate 18, height _0  (1.778 m), weight 72.031 kg (158 lb 12.8 oz), SpO2 100 %. No results found. Results for orders placed or performed during the hospital encounter of 06/26/14 (from the past 72 hour(s))  Glucose, capillary     Status: Abnormal   Collection Time: 06/30/14 11:33 AM  Result Value Ref Range   Glucose-Capillary 101 (H) 70 - 99 mg/dL  Glucose, capillary     Status: Abnormal   Collection Time: 06/30/14  4:51 PM  Result Value Ref Range   Glucose-Capillary 105 (H) 70 - 99 mg/dL  Glucose, capillary     Status: Abnormal   Collection Time: 06/30/14  8:33 PM  Result Value Ref Range   Glucose-Capillary 186 (H) 70 - 99 mg/dL   Comment 1 Notify RN   Glucose, capillary     Status: Abnormal   Collection Time: 07/01/14  6:21 AM  Result Value Ref Range   Glucose-Capillary 100 (H) 70 - 99 mg/dL   Comment 1 Notify RN   Glucose, capillary     Status: None   Collection Time: 07/01/14 12:00 PM  Result Value Ref Range   Glucose-Capillary 99 70 - 99 mg/dL  Glucose, capillary     Status: Abnormal   Collection Time: 07/01/14  4:34 PM  Result Value Ref Range   Glucose-Capillary 128 (H) 70 - 99 mg/dL  Glucose, capillary     Status: Abnormal   Collection Time: 07/01/14  8:28 PM  Result Value Ref Range   Glucose-Capillary 103 (H) 70 - 99 mg/dL  Glucose, capillary     Status: Abnormal   Collection Time: 07/02/14  6:43 AM  Result Value Ref Range   Glucose-Capillary 111 (H) 70 - 99 mg/dL  Glucose, capillary      Status: Abnormal   Collection Time: 07/02/14 11:35 AM  Result Value Ref Range   Glucose-Capillary 114 (H) 70 - 99 mg/dL  Glucose, capillary     Status: Abnormal   Collection Time: 07/02/14  4:46 PM  Result Value Ref Range   Glucose-Capillary 149 (H) 70 - 99 mg/dL  Glucose, capillary     Status: Abnormal   Collection Time: 07/02/14  9:22 PM  Result Value Ref Range   Glucose-Capillary 172 (H) 70 - 99 mg/dL   Comment 1 Notify RN   Glucose, capillary     Status: Abnormal   Collection Time: 07/03/14  6:50 AM  Result Value Ref Range   Glucose-Capillary 100 (H) 70 - 99 mg/dL   Comment 1 Notify RN      HEENT: normal Cardio: RRR Resp: CTA B/L GI: BS positive and non distended Extremity:  No Edema Skin:   Intact Neuro: oriented to [facility], stroke but not date, Abnormal Motor 4/5 in BUE and BLE and Abnormal FMC Ataxic/ dec FMC, Dysdiadochokinesis LUE, mild dysmetria    Musc/Skel:  Normal GEN NAD Oriented to person place, remembers me  Assessment/Plan: 1. Functional deficits secondary to Right Brainstem and /or posterior circulation infarcts with impaired balance,left hemisensory loss and visual deficits  which require 3+ hours per  day of interdisciplinary therapy in a comprehensive inpatient rehab setting. Physiatrist is providing close team supervision and 24 hour management of active medical problems listed below. Physiatrist and rehab team continue to assess barriers to discharge/monitor patient progress toward functional and medical goals.  Team conference today please see physician documentation under team conference tab, met with team face-to-face to discuss problems,progress, and goals. Formulized individual treatment plan based on medical history, underlying problem and comorbidities. FIM: FIM - Bathing Bathing Steps Patient Completed: Chest, Right Arm, Abdomen, Left Arm, Buttocks, Front perineal area, Right upper leg, Left upper leg, Right lower leg (including foot), Left lower leg  (including foot) Bathing: 5: Supervision: Safety issues/verbal cues  FIM - Upper Body Dressing/Undressing Upper body dressing/undressing steps patient completed: Thread/unthread left sleeve of pullover shirt/dress Upper body dressing/undressing: 0: Wears gown/pajamas-no public clothing FIM - Lower Body Dressing/Undressing Lower body dressing/undressing steps patient completed: Don/Doff right sock, Don/Doff left sock, Thread/unthread right underwear leg, Pull underwear up/down, Thread/unthread left underwear leg Lower body dressing/undressing: 0: Wears gown/pajamas-no public clothing  FIM - Toileting Toileting: 0: Activity did not occur  FIM - Radio producer Devices: Elevated toilet seat, Grab bars Toilet Transfers: 0-Activity did not occur  FIM - Control and instrumentation engineer Devices: Environmental consultant [phone]) Bed/Chair Transfer: 5: Bed > Chair or W/C: Supervision (verbal cues/safety issues), 5: Chair or W/C > Bed: Supervision (verbal cues/safety issues)  FIM - Locomotion: Wheelchair Distance: 150 Locomotion: Wheelchair: 0: Activity did not occur FIM - Locomotion: Ambulation Locomotion: Ambulation Assistive Devices: Other (comment) (1EO) Ambulation/Gait Assistance: 5: Supervision Locomotion: Ambulation: 5: Travels 150 ft or more with supervision/safety issues  Comprehension Comprehension Mode: Auditory Comprehension: 5-Follows basic conversation/direction: With no assist  Expression Expression Mode: Verbal Expression: 5-Expresses basic 90% of the time/requires cueing < 10% of the time.  Social Interaction Social Interaction: 4-Interacts appropriately 75 - 89% of the time - Needs redirection for appropriate language or to initiate interaction.  Problem Solving Problem Solving: 4-Solves basic 75 - 89% of the time/requires cueing 10 - 24% of the time  Memory Memory: 4-Recognizes or recalls 75 - 89% of the time/requires cueing 10 - 24% of the  time  Medical Problem List and Plan: 1. 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  DVT Prophylaxis/Anticoagulation: SQ heparin.Monitor platelet counts and any signs of bleeding 3. Pain Management: tylenol as needed 4. Hypertension.Lisinopril 10 mg daily.Monitor with increased mobility 5. Neuropsych: This patient is capable of making decisions on his own behalf. 6. Skin/Wound Care: routine skin checks 7. Fluids/Electrolytes/Nutrition: strict I&O,s  Follow up labs 8.Hyperlipidemia. Lipitor 40 mg daily 9.Diabetes mellitus with peripheral neuropathy. HGB A1c 6.4.Check blood sugars AC&HS.Patient on Glucophage 1500 mg Qam and 500 mg pm prior to admission.Resume as needed 10 Mood/Schizophrenia.Patient on haldol and invega in the past but never refilled med for quite some time and never restarted, no evidence of psychosis or abnormal affect 11.UDS positive marijuana .Provide counseling  LOS (Days) 7 A FACE TO FACE EVALUATION WAS PERFORMED  Benson Porcaro E 07/03/2014, 9:24 AM

## 2014-07-03 NOTE — Plan of Care (Signed)
Problem: RH SAFETY Goal: RH STG ADHERE TO SAFETY PRECAUTIONS W/ASSISTANCE/DEVICE STG Adhere to Safety Precautions With Min Assistance/Device.  Outcome: Not Progressing Multiple attempts to get out of bed unassisted

## 2014-07-03 NOTE — Progress Notes (Signed)
Speech Language Pathology Daily Session Note  Patient Details  Name: [name] Williams MRN: [id number] Date of Birth: 1955-12-17  Today's Date: 07/03/2014 SLP Individual Time: 1000-1030 SLP Individual Time Calculation (min): 30 min  Short Term Goals: Week 1: SLP Short Term Goal 1 (Week 1): STGs = LTGs  Skilled Therapeutic Interventions: Skilled treatment session focused on addressing cognitive-linguistic goals.  Upon SLP arrival, patient sitting in recliner with quick release belt in place and required Mod A multimodal cues for recall of sequencing to transfer from sit to stand with rolling walker and utilization of safety precautions to ambulate to day room. Student also facilitated session by providing Mod A multimodal cues for recall of events during previous therapy sessions and problem solving during a cause and effect functional task. Patient also required Min-Mod A multimodal cues for anticipatory awareness of cognitive and physical deficits prior to d/c home. Continue with current plan of care.    FIM:  Comprehension Comprehension Mode: Auditory Comprehension: 4-Understands basic 75 - 89% of the time/requires cueing 10 - 24% of the time Expression Expression Mode: Verbal Expression: 4-Expresses basic 75 - 89% of the time/requires cueing 10 - 24% of the time. Needs helper to occlude trach/needs to repeat words. Social Interaction Social Interaction: 4-Interacts appropriately 75 - 89% of the time - Needs redirection for appropriate language or to initiate interaction. Problem Solving Problem Solving: 3-Solves basic 50 - 74% of the time/requires cueing 25 - 49% of the time Memory Memory: 3-Recognizes or recalls 50 - 74% of the time/requires cueing 25 - 49% of the time FIM - Eating Eating Activity: 7: Complete independence:no helper  Pain Pain Assessment Pain Assessment: No/denies pain Pain Score: 0-No pain  Therapy/Group: Individual Therapy  Mahoganie Basher 07/03/2014, 11:02  AM

## 2014-07-03 NOTE — Progress Notes (Signed)
Physical Therapy Session Note  Patient Details  Name: [name] Williams MRN: [id number] Date of Birth: 10-04-1955  Today's Date: 07/03/2014 PT Individual Time: 0835-0900 PT Individual Time Calculation (min): 25 min   Short Term Goals: Week 1:  PT Short Term Goal 1 (Week 1): = LTGs due to ELOS  Skilled Therapeutic Interventions/Progress Updates:   Pt received sitting in recliner with quick release belt on, agreeable to therapy. Pt transferred sit > stand with cues for pushing up from seat instead of pulling up on rollator and ambulated using rollator x > 150 ft with supervision and no LOB. Gait training with trial of SPC x 50 ft with max verbal/visual cues for sequencing and supervision-mod A for safety. Pt with transition to step-to pattern with increased L step length and scissoring with use of cane. Gait training with no AD x 50 ft with close supervision and increased time and lateral sway but no LOB. At this time, continue to recommend use of rollator for pt safety. Seated EOM with feet raised off ground, pt participated in reaching task to facilitate ipsilateral trunk elongation and contralateral trunk shortening. Pt ambulated back to room using rollator with supervision and verbal cues to locate items to R and L to facilitate head turns. Pt with report of minimal dizziness that resolves quickly. Pt left sitting in recliner for OT session.   Therapy Documentation Precautions:  Precautions Precautions: Fall Precaution Comments: s/p fall at home with dizziness and unsteady gait Restrictions Weight Bearing Restrictions: No Pain: Pain Assessment Pain Assessment: No/denies pain Pain Score: 0-No pain  See FIM for current functional status  Therapy/Group: Individual Therapy  Kerney ElbeVarner, Lorraina Spring A 07/03/2014, 9:04 AM

## 2014-07-03 NOTE — Plan of Care (Signed)
Problem: RH COGNITION-NURSING Goal: RH STG USES MEMORY AIDS/STRATEGIES W/ASSIST TO PROBLEM SOLVE STG Uses Memory Aids/Strategies With Mod Assistance to Problem Solve.  Outcome: Progressing     

## 2014-07-04 ENCOUNTER — Inpatient Hospital Stay (HOSPITAL_COMMUNITY): Payer: Medicare Other | Admitting: Physical Therapy

## 2014-07-04 ENCOUNTER — Inpatient Hospital Stay (HOSPITAL_COMMUNITY): Payer: Medicare Other | Admitting: Occupational Therapy

## 2014-07-04 ENCOUNTER — Encounter (HOSPITAL_COMMUNITY): Payer: Medicare Other | Admitting: Occupational Therapy

## 2014-07-04 LAB — GLUCOSE, CAPILLARY
Glucose-Capillary: 97 mg/dL (ref 70–99)
Glucose-Capillary: 115 mg/dL — ABNORMAL HIGH (ref 70–99)
Glucose-Capillary: 145 mg/dL — ABNORMAL HIGH (ref 70–99)
Glucose-Capillary: 125 mg/dL — ABNORMAL HIGH (ref 70–99)

## 2014-07-04 NOTE — Progress Notes (Addendum)
Occupational Therapy Session Note  Patient Details  Name: [name] Williams MRN: [id number] Date of Birth: 06-12-1956  Today's Date: 07/04/2014 OT Individual Time: 0900-1000 OT Individual Time Calculation (min): 60 min    Short Term Goals: Week 1:  OT Short Term Goal 1 (Week 1): STGs= LTGs set a supervision to modified independent level  Skilled Therapeutic Interventions/Progress Updates:    Pt performed bathing and dressing during am OT session.  Pt still needing mod instructional cueing for safe Rollator usage as he forgets to push up from the chair to stand or reach back to the chair when sitting.  Also he needs cueing to remember to lock the brakes before sitting as well.  During shower pt with one LOB posteriorly when attempting to switch hands with the washcloth while standing to wash his peri area.  Therapist provided min assist to keep him from falling.  Min instructional cueing to not stand up with towel under his feet when drying off and wait to stand after his feet are dry and the towel is removed.  He was able to transfer to the wheelchair in front of the sink with supervision using the walker but again forgot to lock the brakes or reach back to the wheelchair.  He did successfully perform all dressing with supervision sit to stand however.  Encouraged pt to continue working on visual tracking exercises shown to him yesterday.  Will provide handouts for them next session.   Therapy Documentation Precautions:  Precautions Precautions: Fall Precaution Comments: s/p fall at home with dizziness and unsteady gait Restrictions Weight Bearing Restrictions: No  Pain: Pain Assessment Pain Assessment: No/denies pain ADL: See FIM for current functional status  Therapy/Group: Individual Therapy  Quinne Pires,[place] OTR/L 07/04/2014, 12:24 PM

## 2014-07-04 NOTE — Progress Notes (Signed)
Occupational Therapy Session Note  Patient Details  Name: [name] Williams MRN: [id number] Date of Birth: April 21, 1956  Today's Date: 07/04/2014 OT Individual Time: 1300-1400 OT Individual Time Calculation (min): 60 min    Short Term Goals: Week 1:  OT Short Term Goal 1 (Week 1): STGs= LTGs set a supervision to modified independent level  Skilled Therapeutic Interventions/Progress Updates:    Pt performed shower tub transfers using the Rollator with close supervision.  Discussed use of the shower bench for safety as well as getting a hand held shower.  Also discussed removing any throw rugs in the house or kitchen that could increase fall risk.  He was able to also perform elevated toilet transfer with supervision as well but will not have this at home so 3:1 has been recommended.  Second part of session focused on visual scanning exercises and visual perceptual activities for vision.  He was able to complete house and clock design with adequate detail.  Visual scanning and convergence are still severely affected as pt cannot track in any direction with the right eye isolated and demonstrates lateral horizontal nystagmus in the left eye with tracking.  The left eye will also deviate laterally when attempting to maintain visual attention at midline.  With both eyes open he is able to track superiorly and laterally to the right side but when attempting to track to the left the right eye cannot move medially.  Also with noted left visual field deficit but unsure to the extent as pt's responses were inconsistent.  Pt ambulated to and from the therapy gym and apartment using the RW with close supervision.   Therapy Documentation Precautions:  Precautions Precautions: Fall Precaution Comments: s/p fall at home with dizziness and unsteady gait Restrictions Weight Bearing Restrictions: No  Pain: Pain Assessment Pain Assessment: No/denies pain ADL: See FIM for current functional  status  Therapy/Group: Individual Therapy  Eldo Umanzor,Scotty OTR/L 07/04/2014, 4:23 PM

## 2014-07-04 NOTE — Progress Notes (Signed)
Occupational Therapy Discharge Summary  Patient Details  Name: [name] Williams MRN: [id number] Date of Birth: [birth year]  Today's Date: 07/05/2014 OT Individual Time: 1450-1505 OT Individual Time Calculation (min): 15 min  Session Note:  Pt's sister present for session and therapist educated her on pt's current level of function at an overall supervision level for all selfcare tasks.  Educated her on use of the 3:1 over the toilet as well as use of the tub bench.  Also educated on pt's current visual deficits as well as vision exercises to be performed at home.  Re-emphasized need for pt to have 24 hour supervision for all transfers and mobility and that he is at a high fall risk if he gets up without assistance.  She voices understanding however she does work and currently does not have anyone to be with [name] Williams when she is gone.  Recommend HHOT at discharge.   Patient has met 10 of 10 long term goals due to improved balance, postural control, ability to compensate for deficits and functional use of  LEFT upper extremity.  Patient to discharge at overall Supervision level.  Patient's care partner is independent to provide the necessary physical and cognitive assistance at discharge.    Reasons goals not met: NA  Recommendation:  Patient will benefit from ongoing skilled OT services in home health setting to continue to advance functional skills in the area of BADL.  [name] Williams continues to need mod instructional /demonstrational cueing for safety during bathing tasks and Rollator usage with transfers.  He has the tendency to forget to lock the brakes before sitting and attempts to pull up on the walker when attempting to stand.  He still has a tendency to fall posteriorly in standing when attempting LB selfcare and needs use of a stable surface for support.  Dizziness is also still reported with dynamic balance and mobility as pt continues to exhibit significant visual motor and tracking deficits.   Recommend 24 hour supervision at discharge as pt is still a safety concern with increased fall risk.  This has been explained to the pt's sister.    Equipment: 3:1 and tub bench  Reasons for discharge: treatment goals met and discharge from hospital  Patient/family agrees with progress made and goals achieved: Yes  OT Discharge Precautions/Restrictions  Precautions Precautions: Fall Precaution Comments: s/p fall at home with dizziness and unsteady gait Restrictions Weight Bearing Restrictions: No  Vital Signs Therapy Vitals Temp: 97.8 F (36.6 C) Temp Source: Oral Pulse Rate: 70 Resp: 18 BP: (!) 159/86 mmHg Oxygen Therapy SpO2: 98 % O2 Device: Not Delivered Pain  Vision/Perception  Vision- History Baseline Vision/History: No visual deficits (Dizziness) Patient Visual Report:  (Dizziness) Vision- Assessment Vision Assessment?: Yes Eye Alignment: Impaired (comment) (Left eye deviates laterally with midline gaze.) Ocular Range of Motion: Restricted on the right;Restricted on the left Tracking/Visual Pursuits: Right eye does not track laterally;Right eye does not track medially Saccades: Additional head turns occurred during testing Convergence: Impaired (comment) Visual Fields: Left visual field deficit (Difficult to accurrately assess secondary to inconsistent responses but does appear to demonstrate some left visual field deficit.) Diplopia Assessment: Other (comment) (Pt reports diplopia at times but is inconsistent) Additional Comments: Pt continues to demonstrate occulomotor and tracking deficits bilaterally.  He demonstrates limited AROM in the right eye in all directions when testing with just the right eye open.  With both eyes open he is able to track laterally to the right with the right eye and some  superiorly but cannot maintain fixed superior gaze.  The left eye will track in all directions but demonstrates horizontal nystagmus with lateral and superior tracking.   With fixed midline gaze the left eye will deviate laterally.    Cognition Overall Cognitive Status: Impaired/Different from baseline Arousal/Alertness: Awake/alert Orientation Level: Oriented to person;Oriented to place;Oriented to situation Attention: Sustained Sustained Attention: Appears intact Memory: Impaired Memory Impairment: Decreased recall of new information;Decreased short term memory Decreased Short Term Memory: Functional basic Awareness: Impaired Awareness Impairment: Anticipatory impairment Problem Solving: Impaired Safety/Judgment: Impaired Comments: Pt continues to need cueing for simple problem solving tasks.  He needs mod instructional cueing for safety and is unable to carryover new information from one session to the next.  Sensation Sensation Light Touch: Appears Intact Stereognosis: Appears Intact Hot/Cold: Appears Intact Proprioception: Appears Intact Additional Comments: Pt reports hands feeling different but intact light touch and stereognosis during testing.  Coordination Gross Motor Movements are Fluid and Coordinated: No Fine Motor Movements are Fluid and Coordinated: No Coordination and Movement Description: Pt with decreased speed with finger to nose tesing bilaterally.  He demonstrates some decreased FM coordination in the left hand from previous CVA with atrophy noted in the transverse arch of the hand and clawing position present with digit extension.  Motor  Motor Motor: Abnormal postural alignment and control Motor - Discharge Observations: Pt with increased lean to the left in sitting and standing during dynamic selfcare tasks.   Mobility  Bed Mobility Bed Mobility: Supine to Sit Supine to Sit: 6: Modified independent (Device/Increase time) Transfers Transfers: Sit to Stand Sit to Stand: With upper extremity assist;From toilet Sit to Stand Details: Verbal cues for safe use of DME/AE;Verbal cues for precautions/safety Sit to Stand Details  (indicate cue type and reason): Pt still with posterior bias at times with sit to stand. Stand to Sit: 5: Supervision Stand to Sit Details (indicate cue type and reason): Verbal cues for sequencing;Verbal cues for precautions/safety  Trunk/Postural Assessment  Cervical Assessment Cervical Assessment: Within Functional Limits Thoracic Assessment Thoracic Assessment: Within Functional Limits Lumbar Assessment Lumbar Assessment: Within Functional Limits Postural Control Postural Limitations: Pt with increased lean to the left in sititng when performing bathing tasks as well as with mobility when using the RW.  Balance Balance Balance Assessed: Yes Static Standing Balance Static Standing - Balance Support: No upper extremity supported Static Standing - Level of Assistance: 5: Stand by assistance Dynamic Standing Balance Dynamic Standing - Balance Support: Right upper extremity supported;Left upper extremity supported Dynamic Standing - Level of Assistance: 5: Stand by assistance Extremity/Trunk Assessment  LUE Assessment LUE Assessment: Exceptions to [facility] LUE Strength LUE Overall Strength Comments: Pt with history of mild hemiparesis in the LUE, with the hand being more affected than the arm.  AROM for shoulder and elbow WFLs with strength 3+/5 at the shoulder and 4/5 at the elbow,  Gross grasp and release present with grip strength at 3+/5.  Intrinsic hand atrophy noted at the transverse arch with pt collapsing at the MPs with digit extension.  Decreased efficiency with FM tasks as well  See FIM for current functional status  Chailyn Racette,Jasyah OTR/L 07/05/2014, 4:24 PM

## 2014-07-04 NOTE — Progress Notes (Signed)
Subjective/Complaints: No problems overnight. Patient denies any pain complaints. No breathing problems  Review of Systems - neg except as above Objective: Vital Signs: Blood pressure 129/77, pulse 69, temperature 97.9 F (36.6 C), temperature source Oral, resp. rate 16, height 5\' 10"  (1.778 m), weight 72.031 kg (158 lb 12.8 oz), SpO2 100 %. No results found. Results for orders placed or performed during the hospital encounter of 06/26/14 (from the past 72 hour(s))  Glucose, capillary     Status: None   Collection Time: 07/01/14 12:00 PM  Result Value Ref Range   Glucose-Capillary 99 70 - 99 mg/dL  Glucose, capillary     Status: Abnormal   Collection Time: 07/01/14  4:34 PM  Result Value Ref Range   Glucose-Capillary 128 (H) 70 - 99 mg/dL  Glucose, capillary     Status: Abnormal   Collection Time: 07/01/14  8:28 PM  Result Value Ref Range   Glucose-Capillary 103 (H) 70 - 99 mg/dL  Glucose, capillary     Status: Abnormal   Collection Time: 07/02/14  6:43 AM  Result Value Ref Range   Glucose-Capillary 111 (H) 70 - 99 mg/dL  Glucose, capillary     Status: Abnormal   Collection Time: 07/02/14 11:35 AM  Result Value Ref Range   Glucose-Capillary 114 (H) 70 - 99 mg/dL  Glucose, capillary     Status: Abnormal   Collection Time: 07/02/14  4:46 PM  Result Value Ref Range   Glucose-Capillary 149 (H) 70 - 99 mg/dL  Glucose, capillary     Status: Abnormal   Collection Time: 07/02/14  9:22 PM  Result Value Ref Range   Glucose-Capillary 172 (H) 70 - 99 mg/dL   Comment 1 Notify RN   Glucose, capillary     Status: Abnormal   Collection Time: 07/03/14  6:50 AM  Result Value Ref Range   Glucose-Capillary 100 (H) 70 - 99 mg/dL   Comment 1 Notify RN   Glucose, capillary     Status: Abnormal   Collection Time: 07/03/14 11:17 AM  Result Value Ref Range   Glucose-Capillary 105 (H) 70 - 99 mg/dL  Glucose, capillary     Status: Abnormal   Collection Time: 07/03/14  4:56 PM  Result  Value Ref Range   Glucose-Capillary 109 (H) 70 - 99 mg/dL  Glucose, capillary     Status: Abnormal   Collection Time: 07/03/14  9:03 PM  Result Value Ref Range   Glucose-Capillary 125 (H) 70 - 99 mg/dL   Comment 1 Notify RN   Glucose, capillary     Status: None   Collection Time: 07/04/14  6:56 AM  Result Value Ref Range   Glucose-Capillary 97 70 - 99 mg/dL   Comment 1 Notify RN      HEENT: normal Cardio: RRR Resp: CTA B/L GI: BS positive and non distended Extremity:  No Edema Skin:   Intact Neuro: oriented to [facility], stroke but not date, Abnormal Motor 4/5 in BUE and BLE and Abnormal FMC Ataxic/ dec FMC, Dysdiadochokinesis LUE, mild dysmetria    Musc/Skel:  Normal GEN NAD Oriented to person place, remembers me  Assessment/Plan: 1. 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  Ready for D/C in am FIM: FIM - Bathing Bathing Steps Patient Completed: Chest, Right Arm, Abdomen, Left Arm, Buttocks, Front perineal area, Right  upper leg, Left upper leg, Right lower leg (including foot), Left lower leg (including foot) Bathing: 5: Supervision: Safety issues/verbal cues  FIM - Upper Body Dressing/Undressing Upper body dressing/undressing steps patient completed: Thread/unthread left sleeve of pullover shirt/dress Upper body dressing/undressing: 0: Wears gown/pajamas-no public clothing FIM - Lower Body Dressing/Undressing Lower body dressing/undressing steps patient completed: Don/Doff right sock, Don/Doff left sock, Thread/unthread right underwear leg, Pull underwear up/down, Thread/unthread left underwear leg Lower body dressing/undressing: 5: Supervision: Safety issues/verbal cues  FIM - Toileting Toileting: 0: Activity did not occur  FIM - Scientist, research (physical sciences)Toilet Transfers Toilet Transfers  Assistive Devices: Elevated toilet seat, Grab bars Toilet Transfers: 0-Activity did not occur  FIM - BankerBed/Chair Transfer Bed/Chair Transfer Assistive Devices: Environmental consultantWalker [phone]([phone]) Bed/Chair Transfer: 5: Bed > Chair or W/C: Supervision (verbal cues/safety issues), 5: Chair or W/C > Bed: Supervision (verbal cues/safety issues)  FIM - Locomotion: Wheelchair Distance: 150 Locomotion: Wheelchair: 0: Activity did not occur FIM - Locomotion: Ambulation Locomotion: Ambulation Assistive Devices: Other (comment) (0RU(4WW) Ambulation/Gait Assistance: 5: Supervision, 4: Min guard Locomotion: Ambulation: 4: Travels 150 ft or more with minimal assistance (Pt.>75%)  Comprehension Comprehension Mode: Auditory Comprehension: 4-Understands basic 75 - 89% of the time/requires cueing 10 - 24% of the time  Expression Expression Mode: Verbal Expression: 4-Expresses basic 75 - 89% of the time/requires cueing 10 - 24% of the time. Needs helper to occlude trach/needs to repeat words.  Social Interaction Social Interaction: 4-Interacts appropriately 75 - 89% of the time - Needs redirection for appropriate language or to initiate interaction.  Problem Solving Problem Solving: 3-Solves basic 50 - 74% of the time/requires cueing 25 - 49% of the time  Memory Memory: 3-Recognizes or recalls 50 - 74% of the time/requires cueing 25 - 49% of the time  Medical Problem List and Plan: 1. 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  DVT Prophylaxis/Anticoagulation: SQ heparin.Monitor platelet counts and any signs of bleeding 3. Pain Management: tylenol as needed 4. Hypertension.Lisinopril 10 mg daily.Monitor with increased mobility 5. Neuropsych: This patient is capable of making decisions on his own behalf. 6. Skin/Wound Care: routine skin checks 7. Fluids/Electrolytes/Nutrition: strict I&O,s  Follow up  labs 8.Hyperlipidemia. Lipitor 40 mg daily 9.Diabetes mellitus with peripheral neuropathy. HGB A1c 6.4.Check blood sugars AC&HS.Patient on Glucophage 1500 mg Qam and 500 mg pm prior to admission.CBGs fine on diet control   LOS (Days) 8 A FACE TO FACE EVALUATION WAS PERFORMED  Dewayne Severe E 07/04/2014, 8:34 AM

## 2014-07-04 NOTE — Discharge Summary (Signed)
Discharge summary job [id number]

## 2014-07-04 NOTE — Plan of Care (Signed)
Problem: RH Balance Goal: LTG Patient will maintain dynamic standing with ADLs (OT) LTG: Patient will maintain dynamic standing balance with assist during activities of daily living (OT)  Outcome: Completed/Met Date Met:  07/04/14  Problem: RH Eating Goal: LTG Patient will perform eating w/assist, cues/equip (OT) LTG: Patient will perform eating with assist, with/without cues using equipment (OT)  Outcome: Completed/Met Date Met:  07/04/14  Problem: RH Grooming Goal: LTG Patient will perform grooming w/assist,cues/equip (OT) LTG: Patient will perform grooming with assist, with/without cues using equipment (OT)  Outcome: Completed/Met Date Met:  07/04/14  Problem: RH Bathing Goal: LTG Patient will bathe with assist, cues/equipment (OT) LTG: Patient will bathe specified number of body parts with assist with/without cues using equipment (position) (OT)  Outcome: Completed/Met Date Met:  07/04/14  Problem: RH Dressing Goal: LTG Patient will perform upper body dressing (OT) LTG Patient will perform upper body dressing with assist, with/without cues (OT).  Outcome: Completed/Met Date Met:  07/04/14 Goal: LTG Patient will perform lower body dressing w/assist (OT) LTG: Patient will perform lower body dressing with assist, with/without cues in positioning using equipment (OT)  Outcome: Completed/Met Date Met:  07/04/14  Problem: RH Toileting Goal: LTG Patient will perform toileting w/assist, cues/equip (OT) LTG: Patient will perform toiletiing (clothes management/hygiene) with assist, with/without cues using equipment (OT)  Outcome: Completed/Met Date Met:  07/04/14  Problem: RH Vision Goal: RH LTG Vision (Specify) Outcome: Completed/Met Date Met:  07/04/14  Problem: RH Functional Use of Upper Extremity Goal: LTG Patient will use RT/LT upper extremity as a (OT) LTG: Patient will use right/left upper extremity as a stabilizer/gross assist/diminished/nondominant/dominant level with  assist, with/without cues during functional activity (OT)  Outcome: Completed/Met Date Met:  07/04/14  Problem: RH Simple Meal Prep Goal: LTG Patient will perform simple meal prep w/assist (OT) LTG: Patient will perform simple meal prep with assistance, with/without cues (OT).  Outcome: Not Applicable Date Met:  07/86/75  Problem: RH Toilet Transfers Goal: LTG Patient will perform toilet transfers w/assist (OT) LTG: Patient will perform toilet transfers with assist, with/without cues using equipment (OT)  Outcome: Completed/Met Date Met:  07/04/14  Problem: RH Tub/Shower Transfers Goal: LTG Patient will perform tub/shower transfers w/assist (OT) LTG: Patient will perform tub/shower transfers with assist, with/without cues using equipment (OT)  Outcome: Completed/Met Date Met:  07/04/14

## 2014-07-04 NOTE — Progress Notes (Signed)
Social Work Patient ID: [name] Williams, male   DOB: June 22, 1956, 58 y.o.   MRN: [id number]   CSW called [name]'s sister on 07-03-14 to update her on team conference discussion.   Sister, [name] Williams, was pleased that [name] could go home on 07-05-14.  She will come to get him around 3:30pm when her work day is complete.  CSW talked with sister about hospital staff finding bed bugs on [name]'s clothing and she was aware of this.  CSW told her that there was information left in [name]'s room about how to eradicate the bed bugs.  She was appreciative of this and of CSW's assistance.  [name] Williams continues to work on having someone with [name] 24/7 for supervision.  She will be with him after work daily and takes him to appointments.  CSW spoke with [name] Williams at Risk Management 07-02-14 for her to read the safety zone portal submitted by [name] PurlMaryann Barbour, [name] Williams re: what has been done thus far in re: to the bed bugs.  CSW then talked with Infection Prevention on 07-02-14 [name] Che(Margaret) who advised CSW to give [name] Williams and [name] Williams information on bed bugs and how to clean home to eradicate them.  They also felt that bringing clothing from home without knowing if home had been treated/cleaned, would put [name] at further risk, so [name] Williams, Recreation Therapist, provided [name] with clothes to use here and to wear home.  CSW explained this to [name]'s sister and she understood and appreciated the clothing.  Updated staff on the above.  CSW will continue to follow and assist [name] as needed.

## 2014-07-04 NOTE — Discharge Summary (Signed)
NAMToniann Ket:  Mcgruder, Iden                ACCOUNT NO.:  [id number]  MEDICAL RECORD NO.:  [id number]  LOCATION:  [id number]                        FACILITY:  [facility]  PHYSICIAN:  Erick ColaceAndrew E. Kirsteins, M.D.DATE OF BIRTH:  1956-04-10  DATE OF ADMISSION:  06/26/2014 DATE OF DISCHARGE:  07/05/2014                              DISCHARGE SUMMARY   DISCHARGE DIAGNOSES: 1. Functional deficits secondary to right brain stem and/or posterior     circulation infarct. 2. Subcutaneous heparin for DVT prophylaxis. 3. Hypertension. 4. Diabetes mellitus. 5. Hyperlipidemia. 6. Mood with reported history of schizophrenia. 7. Urine drug screen positive for marijuana.  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  The patient lives with his sister, was independent prior to admission.  Admitted on June 23, 2014, with increased left-sided weakness and slurred speech as well as blurred vision.  MRI showed no acute infarct.  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  Neurology followup suspect posterior circulation infarcts secondary to small vessel disease.  Echocardiogram with ejection fraction of 50%, grade 1 diastolic dysfunction.  Carotid Dopplers with left 40-59% ICA stenosis. Urine drug screen upon admission positive for marijuana.  The patient did not receive tPA.  Neurology Service was consulted, maintained on aspirin therapy as well as the addition of subcutaneous heparin for DVT prophylaxis.  The patient was tolerating a regular consistency diet. Physical and occupational therapy ongoing.  The patient was admitted for comprehensive rehab program.  PAST MEDICAL HISTORY:  See discharge  diagnoses.  SOCIAL HISTORY:  Lives with family.  FUNCTIONAL HISTORY:  Prior to admission, independent.  His sister provides meals and transportation.  Functional status upon admission to rehab services was minimal assist ambulate 80 feet with the rolling walker, moderate assist for general transfers, min mod assist activities of daily living.  PHYSICAL EXAMINATION:  VITAL SIGNS:  Blood pressure 132/67, pulse 65, temperature 98, respirations 18. GENERAL:  This is an alert male, oriented to person, place, date of birth.  He did follow simple commands.  Noted dysconjugate gaze.  Right INO with inability to adduct of the right eye.  Left eye with mild nystagmus during leftward gaze.  Speech was mildly dysarthric, but intelligible. LUNGS:  Clear to auscultation. CARDIAC:  Regular rate and rhythm. ABDOMEN:  Soft, nontender.  Good bowel sounds.  REHABILITATION HOSPITAL COURSE:  Patient was admitted to inpatient rehab services with therapies initiated on a 3-hour daily basis consisting of physical therapy, occupational therapy, and rehabilitation nursing.  The following issues were addressed during the patient's rehabilitation stay.  Pertaining to Mr. Luetta NuttingReaves' functional deficits secondary to suspect posterior circulation infarct, possible right brain stem infarct remained stable, maintained on aspirin and Plavix therapy.  He would follow up with Neurology Services.  He remained on subcutaneous heparin for DVT prophylaxis throughout his rehab course.  No bleeding episodes.  This was discontinued at time of discharge.  Blood pressures controlled with lisinopril 10 mg daily.  He would  follow up with his primary care doctor.  He did have a history of diabetes mellitus and peripheral neuropathy.  Hemoglobin A1c 6.4, blood sugars doing quite well during his hospital stay.  He was on Glucophage prior to admission.  Full diabetic teaching completed.  Documentation of history of  schizophrenia, however, patient on Haldol Invega in the past, but never refilled medication for quite some time and never restarted.  He was fully cooperative with staff attending full sections.  Urine drug screen upon admission positive for marijuana.  He did receive full counseling in regard to cessation of any illicit products.  It was questionable if he would be compliant with these requests.  The patient received weekly collaborative interdisciplinary team conferences to discuss estimated length of stay, family teaching, and any barriers to discharge.  The patient participated fully with group sessions.  He required supervision for sit to stand, close supervision minimal guard to ambulate 200 feet with assistive device.  Patient excellent tolerance with bilateral upper lower extremity target strength and endurance testing.  He was able to wash, sit to stand, minimal guard assist, minimal instructions for activities of daily living.  He was able to perform all of his grooming and sitting at the sink with supervision.  Full family teaching was completed.  Plan was to be discharged to home with home health physical and occupational therapy.  DISCHARGE MEDICATIONS: 1. Aspirin 81 mg p.o. daily. 2. Lipitor 40 mg p.o. daily. 3. Lisinopril 10 mg p.o. daily. 4. Plavix 75 mg daily DIET:  Diabetic diet.  Patient could resume his Glucophage upon follow up with his primary care provider.  Home health physical and occupational therapy arranged.     Mariam Dollaraniel Wilton Thrall, P.A.   ______________________________ Erick ColaceAndrew E. Kirsteins, M.D.    DA/MEDQ  D:  07/04/2014  T:  07/04/2014  Job:  [id number]  cc:   Marvel PlanJindong Xu, MD Dr. Gara Kroneriana Truong

## 2014-07-04 NOTE — Progress Notes (Signed)
Physical Therapy Session Note  Patient Details  Name: [name] Williams MRN: [id number] Date of Birth: 02/04/1956  Today's Date: 07/04/2014 PT Individual Time: [id number] PT Individual Time Calculation (min): 60 min   Short Term Goals: Week 1:  PT Short Term Goal 1 (Week 1): = LTGs due to ELOS  Skilled Therapeutic Interventions/Progress Updates:   Pt received sitting in recliner, agreeable to therapy. Gait training using rollator 2 x >150 ft with supervision, verbal cues for increased gait speed and head turns to interact with environment as pt keeps head/neck very still to limit dizziness. Pt performed 10-meter walk test with rolling walker 44 sec, without AD 36 sec, with rollator 29 sec. Continue to recommend use of rollator for improved safety and increased gait speed, with no AD pt with increased staggering/LOB but able to correct and with RW pt with greatly decreased gait speed. Pt performed floor transfer x 2 with min cues for sequencing and supervision. Patient performed static standing on foam mat with focus on anterior weight shift and posterior pelvic tilt in standing to engage core musculature with eyes open/eyes closed, supervision-max A to prevent fall. Pt demo stepping strategy for balance reactions. Pt participated in 3 rounds of horseshoes for dynamic standing balance on foam mat with S-max A for balance. Pt negotiated up/down flight of stairs with 1 rail and supervision ascending and min A descending with cues for step-to pattern and shifting weight anteriorly when stepping versus maintaining weight posterior. Pt appears to have difficulty with depth perception for foot placement on each step. Pt returned to room and left sitting in recliner with quick release belt on and NT present.  Therapy Documentation Precautions:  Precautions Precautions: Fall Precaution Comments: s/p fall at home with dizziness and unsteady gait Restrictions Weight Bearing Restrictions: No Pain: Pain  Assessment Pain Assessment: No/denies pain Pain Score: 0-No pain Patients Stated Pain Goal: 3 Multiple Pain Sites: No  See FIM for current functional status  Therapy/Group: Individual Therapy  Kerney ElbeVarner, Tayloranne Lekas A 07/04/2014, 11:50 AM

## 2014-07-05 ENCOUNTER — Inpatient Hospital Stay (HOSPITAL_COMMUNITY): Payer: Medicare Other | Admitting: Physical Therapy

## 2014-07-05 ENCOUNTER — Encounter (HOSPITAL_COMMUNITY): Payer: Medicare Other | Admitting: Speech Pathology

## 2014-07-05 LAB — GLUCOSE, CAPILLARY
GLUCOSE-CAPILLARY: 163 mg/dL — AB (ref 70–99)
GLUCOSE-CAPILLARY: 88 mg/dL (ref 70–99)

## 2014-07-05 MED ORDER — ASPIRIN EC 81 MG PO TBEC: 81.0000 mg | DELAYED_RELEASE_TABLET | Freq: Every day | ORAL | Status: DC

## 2014-07-05 MED ORDER — ATORVASTATIN CALCIUM 40 MG PO TABS: 40.0000 mg | ORAL_TABLET | Freq: Every day | ORAL | Status: DC

## 2014-07-05 MED ORDER — LISINOPRIL 10 MG PO TABS: 10.0000 mg | ORAL_TABLET | Freq: Every day | ORAL | Status: DC

## 2014-07-05 MED ORDER — METFORMIN HCL 1000 MG PO TABS: ORAL_TABLET | ORAL | Status: DC

## 2014-07-05 MED ORDER — ASPIRIN 325 MG PO TBEC: 325.0000 mg | DELAYED_RELEASE_TABLET | Freq: Every day | ORAL | Status: DC

## 2014-07-05 MED ORDER — CLOPIDOGREL BISULFATE 75 MG PO TABS
75.0000 mg | ORAL_TABLET | Freq: Every day | ORAL | Status: DC
  Administered 2014-07-05: 75 mg via ORAL
  Filled 2014-07-05 (×2): qty 1

## 2014-07-05 MED ORDER — CLOPIDOGREL BISULFATE 75 MG PO TABS: 75.0000 mg | ORAL_TABLET | Freq: Every day | ORAL | Status: DC

## 2014-07-05 MED ORDER — ASPIRIN 81 MG PO CHEW
81.0000 mg | CHEWABLE_TABLET | Freq: Every day | ORAL | Status: DC
  Administered 2014-07-05: 81 mg via ORAL
  Filled 2014-07-05: qty 1

## 2014-07-05 NOTE — Progress Notes (Signed)
Physical Therapy Discharge Summary  Patient Details  Name: [name] Williams MRN: [id number] Date of Birth: 02-25-1956  Today's Date: 07/05/2014 PT Individual Time: 0835-0905 PT Individual Time Calculation (min): 30 min    Patient has met 8 of 8 long term goals due to improved activity tolerance, improved balance, improved postural control, increased strength, ability to compensate for deficits, functional use of  left upper extremity and left lower extremity, improved attention and improved awareness.  Patient to discharge at an ambulatory level Supervision. Patient's care partner unavailable to provide the necessary physical assistance at discharge. Patient's sister aware of recommendation for 24/7 supervision and working on finding someone to stay with patient while she is at work. Due to sister's work schedule, sister has not been available for family education with physical therapy but plans to complete training with speech therapy and occupational therapy this PM.   Reasons goals not met: NA  Recommendation:  Patient will benefit from ongoing skilled PT services in home health setting to continue to advance safe functional mobility, address ongoing impairments in dynamic balance, NMR, safety, dizziness with mobility/head turns, strength, and minimize fall risk.  Equipment: rollator  Reasons for discharge: treatment goals met and discharge from hospital  Patient/family agrees with progress made and goals achieved: Yes  Skilled Therapeutic Intervention: Patient received sitting in recliner, agreeable to therapy. Pt at supervision level for gait in controlled and home environments using rollator x > 150 ft, furniture transfers, car transfers, stand pivot transfers using rollator, and stair negotiation up/down flight of steps with verbal cues for step-to pattern and anterior weight shift. Pt performed bed mobility on regular bed in ADL apartment with mod I. Pt continues to require verbal cues  for safety with rollator including hand placement with sit <> stand and brake management. Berg Balance Scale administered with score of 41/56, greatly improved from 10/56 on eval. Pt also demo improvement on 10 MWT with score of 1.5 m/s improved from 2.9 m/s during yesterday's session. However, reinforced that pt remains at risk of falls and continue to recommend 24/7 supervision for safety. Pt verbalized understanding. Pt with no further questions regarding discharge. Pt left sitting in recliner with quick release belt on and all needs within reach.   PT Discharge Precautions/Restrictions Precautions Precautions: Fall Restrictions Weight Bearing Restrictions: No Pain  Denies pain Vision/Perception   Defer to OT discharge summary Cognition Overall Cognitive Status: Impaired/Different from baseline Arousal/Alertness: Awake/alert Orientation Level: Oriented to person;Oriented to place;Oriented to situation Attention: Sustained Sustained Attention: Appears intact Memory: Impaired Memory Impairment: Decreased recall of new information;Decreased short term memory Decreased Short Term Memory: Functional basic Awareness: Impaired Awareness Impairment: Anticipatory impairment Problem Solving: Impaired Safety/Judgment: Impaired Comments: Pt continues to need cueing for simple problem solving tasks.  He needs mod instructional cueing for safety and is unable to carryover new information from one session to the next.  Sensation Sensation Light Touch: Appears Intact Stereognosis: Appears Intact Hot/Cold: Appears Intact Proprioception: Appears Intact Additional Comments: Pt reports hands feeling different but intact light touch and stereognosis during testing.  Coordination Gross Motor Movements are Fluid and Coordinated: No Fine Motor Movements are Fluid and Coordinated: No Coordination and Movement Description: Pt with decreased speed with finger to nose tesing bilaterally.  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  Motor  Motor Motor: Abnormal postural alignment and control Motor -  Discharge Observations: lateral lean to left and weight shifted posterior  Mobility Bed Mobility Bed Mobility: Supine to Sit;Sit to Supine Supine to Sit: 6: Modified independent (Device/Increase time) Sit to Supine: 6: Modified independent (Device/Increase time) Transfers Transfers: Yes Sit to Stand: With upper extremity assist;From bed;From chair/3-in-1;5: Supervision Sit to Stand Details: Verbal cues for precautions/safety Sit to Stand Details (indicate cue type and reason): vc's for hand placement  Stand to Sit: 5: Supervision Stand to Sit Details (indicate cue type and reason): Verbal cues for precautions/safety Stand Pivot Transfers: 5: Supervision Locomotion  Ambulation Ambulation: Yes Ambulation/Gait Assistance: 5: Supervision Ambulation Distance (Feet): 160 Feet Assistive device: Rollator Gait Gait: Yes Gait Pattern: Impaired Gait Pattern: Step-through pattern;Narrow base of support;Trunk flexed;Decreased trunk rotation;Lateral trunk lean to left;Decreased dorsiflexion - left;Shuffle Gait velocity: 10 MWT = 1.5 m/s, gait mechanics improve with verbal cues to increase gait speed Stairs / Additional Locomotion Stairs: Yes Stairs Assistance: 5: Supervision Stairs Assistance Details (indicate cue type and reason): verbal cues for step-to pattern and advancing UE support on rail versus maintaining weight shifted posteriorly Stair Management Technique: One rail Right;Forwards;Step to pattern Number of Stairs: 12 Height of Stairs: 6 Wheelchair Mobility Wheelchair Mobility: No (pt ambulatory)  Trunk/Postural Assessment  Cervical Assessment Cervical Assessment: Within Functional Limits Thoracic Assessment Thoracic Assessment: Within  Functional Limits Lumbar Assessment Lumbar Assessment: Within Functional Limits Postural Control Postural Control: Deficits on evaluation Protective Responses: impaired Postural Limitations: keeps weight shifted posterior  Balance Balance Balance Assessed: Yes Standardized Balance Assessment Standardized Balance Assessment: Berg Balance Test Berg Balance Test Sit to Stand: Able to stand without using hands and stabilize independently Standing Unsupported: Able to stand safely 2 minutes Sitting with Back Unsupported but Feet Supported on Floor or Stool: Able to sit safely and securely 2 minutes Stand to Sit: Sits safely with minimal use of hands Transfers: Able to transfer safely, minor use of hands Standing Unsupported with Eyes Closed: Able to stand 10 seconds with supervision Standing Ubsupported with Feet Together: Able to place feet together independently and stand 1 minute safely From Standing, Reach Forward with Outstretched Arm: Can reach confidently >25 cm (10") From Standing Position, Pick up Object from Floor: Able to pick up shoe safely and easily From Standing Position, Turn to Look Behind Over each Shoulder: Turn sideways only but maintains balance Turn 360 Degrees: Needs assistance while turning Standing Unsupported, Alternately Place Feet on Step/Stool: Able to complete >2 steps/needs minimal assist Standing Unsupported, One Foot in Front: Needs help to step but can hold 15 seconds Standing on One Leg: Able to lift leg independently and hold equal to or more than 3 seconds Total Score: 41/56 Extremity Assessment  RUE Assessment RUE Assessment: Within Functional Limits LUE Assessment LUE Assessment: Exceptions to [facility] LUE Strength LUE Overall Strength Comments: Pt with history of mild hemiparesis in the LUE, with the hand being more affected than the arm.  AROM for shoulder and elbow WFLs with strength 3+/5 at the shoulder and 4/5 at the elbow,  Gross grasp and release  present with grip strength at 3+/5.  Intrinsic hand atrophy noted at the transverse arch with pt collapsing at the MPs with digit extension.  Decreased efficiency with FM tasks as well RLE Assessment RLE Assessment: Within Functional Limits LLE Assessment LLE Assessment: Within Functional Limits (grossly 4+ to 5/5 throughout)  See FIM for current functional status  Laretta Alstrom 07/05/2014, 9:41 AM

## 2014-07-05 NOTE — Progress Notes (Signed)
Speech Language Pathology Discharge Summary  Patient Details  Name: Anthany Thornhill MRN: [id number] Date of Birth: [birth year]  Today's Date: 07/05/2014 SLP Individual Time: 1542-1550 SLP Individual Time Calculation (min): 8 min   Skilled Therapeutic Interventions: Skilled treatment session focused on addressing education wrap-up.  SLP utilized teach back to educate sister and patient on need for and types of cues that are most effective to facilitate recall and safety while sequencing transfer from sitting to standing with rolling walker.  Sister reported that patient's speech and thinking skills are at baseline level of functioning.  Patient ready for discharge.   Patient has met 3 of 3 long term goals.  Patient to discharge at overall Min;Mod level.  Reasons goals not met: n/a   Clinical Impression/Discharge Summary: Patient has made functional gains and has met 3 out of 3 long term goals during this admission due to improved ability to produce intelligible speech, recall new information and make safe decisions. Patient is currently requiring Min-Mod assist with these areas.  Patient and family education completed with sister and despite recommendation for 24/7 Supervision sister reports that he will not have that while she is at work.  Sister reports that speech and thinking skills are at baseline and as a result, no further skilled SLP services are warranted at this time.    Care Partner:  Caregiver Able to Provide Assistance: Yes;No  Type of Caregiver Assistance: Cognitive;Physical  Recommendation:  24 hour supervision/assistance  Rationale for SLP Follow Up: Other (comment) (n/a)   Equipment: none   Reasons for discharge: Treatment goals met;Discharged from hospital   Patient/Family Agrees with Progress Made and Goals Achieved: Yes   See FIM for current functional status  Carmelia Roller., CCC-SLP [place] 07/05/2014, 4:56 PM

## 2014-07-05 NOTE — Plan of Care (Signed)
Problem: RH COGNITION-NURSING Goal: RH STG USES MEMORY AIDS/STRATEGIES W/ASSIST TO PROBLEM SOLVE STG Uses Memory Aids/Strategies With Mod Assistance to Problem Solve.  Outcome: Completed/Met Date Met:  07/05/14

## 2014-07-05 NOTE — Progress Notes (Signed)
Patient and sister [name] received written and verbal discharge instructions, patient aware of follow up appointments, medications, etc, denies any questions or concerns. Reviewed with patient diabetic diet, monitoring blood sugars, medications compliance, smoking cessation, limiting or stopping alcohol consumption. Patient verbalized understanding, handouts provided in his education notebook , personal belongings packed by patient and staff. Patient taken down by NT via wheelchair. Roberts-VonCannon, Dailee Manalang Elon JesterMichele

## 2014-07-05 NOTE — Plan of Care (Signed)
Problem: RH SAFETY Goal: RH STG ADHERE TO SAFETY PRECAUTIONS W/ASSISTANCE/DEVICE STG Adhere to Safety Precautions With Min Assistance/Device.  Outcome: Completed/Met Date Met:  07/05/14

## 2014-07-05 NOTE — Progress Notes (Signed)
Subjective/Complaints: Excited about D/C  Review of Systems - neg except as above Objective: Vital Signs: Blood pressure 129/67, pulse 55, temperature 97.9 F (36.6 C), temperature source Oral, resp. rate 16, height 5\' 10"  (1.778 m), weight 72.031 kg (158 lb 12.8 oz), SpO2 100 %. No results found. Results for orders placed or performed during the hospital encounter of 06/26/14 (from the past 72 hour(s))  Glucose, capillary     Status: Abnormal   Collection Time: 07/02/14 11:35 AM  Result Value Ref Range   Glucose-Capillary 114 (H) 70 - 99 mg/dL  Glucose, capillary     Status: Abnormal   Collection Time: 07/02/14  4:46 PM  Result Value Ref Range   Glucose-Capillary 149 (H) 70 - 99 mg/dL  Glucose, capillary     Status: Abnormal   Collection Time: 07/02/14  9:22 PM  Result Value Ref Range   Glucose-Capillary 172 (H) 70 - 99 mg/dL   Comment 1 Notify RN   Glucose, capillary     Status: Abnormal   Collection Time: 07/03/14  6:50 AM  Result Value Ref Range   Glucose-Capillary 100 (H) 70 - 99 mg/dL   Comment 1 Notify RN   Glucose, capillary     Status: Abnormal   Collection Time: 07/03/14 11:17 AM  Result Value Ref Range   Glucose-Capillary 105 (H) 70 - 99 mg/dL  Glucose, capillary     Status: Abnormal   Collection Time: 07/03/14  4:56 PM  Result Value Ref Range   Glucose-Capillary 109 (H) 70 - 99 mg/dL  Glucose, capillary     Status: Abnormal   Collection Time: 07/03/14  9:03 PM  Result Value Ref Range   Glucose-Capillary 125 (H) 70 - 99 mg/dL   Comment 1 Notify RN   Glucose, capillary     Status: None   Collection Time: 07/04/14  6:56 AM  Result Value Ref Range   Glucose-Capillary 97 70 - 99 mg/dL   Comment 1 Notify RN   Glucose, capillary     Status: Abnormal   Collection Time: 07/04/14 11:44 AM  Result Value Ref Range   Glucose-Capillary 115 (H) 70 - 99 mg/dL  Glucose, capillary     Status: Abnormal   Collection Time: 07/04/14  5:00 PM  Result Value Ref Range   Glucose-Capillary 145 (H) 70 - 99 mg/dL  Glucose, capillary     Status: Abnormal   Collection Time: 07/04/14  9:18 PM  Result Value Ref Range   Glucose-Capillary 125 (H) 70 - 99 mg/dL   Comment 1 Notify RN   Glucose, capillary     Status: Abnormal   Collection Time: 07/05/14  6:39 AM  Result Value Ref Range   Glucose-Capillary 163 (H) 70 - 99 mg/dL   Comment 1 Notify RN      HEENT: normal Cardio: RRR Resp: CTA B/L GI: BS positive and non distended Extremity:  No Edema Skin:   Intact Neuro: [facility], stroke but not date, Abnormal Motor 4/5 in BUE and BLE and Abnormal FMC Ataxic/ dec FMC, Dysdiadochokinesis LUE, mild dysmetria    Musc/Skel:  Normal GEN NAD Oriented to person place, remembers me  Assessment/Plan: 1. 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  Stable for D/C today F/u PCP in 1-2 weeks F/u PM&R 3 weeks See D/C summary See D/C  instructions FIM: FIM - Bathing Bathing Steps Patient Completed: Chest, Right Arm, Abdomen, Left Arm, Buttocks, Front perineal area, Right upper leg, Left upper leg, Right lower leg (including foot), Left lower leg (including foot) Bathing: 4: Min-Patient completes 8-9 1524f 10 parts or 75+ percent  FIM - Upper Body Dressing/Undressing Upper body dressing/undressing steps patient completed: Thread/unthread right sleeve of pullover shirt/dresss, Thread/unthread left sleeve of pullover shirt/dress, Put head through opening of pull over shirt/dress, Pull shirt over trunk Upper body dressing/undressing: 5: Supervision: Safety issues/verbal cues FIM - Lower Body Dressing/Undressing Lower body dressing/undressing steps patient completed: Don/Doff right sock, Don/Doff left sock, Thread/unthread right underwear leg, Pull underwear  up/down, Thread/unthread left underwear leg, Thread/unthread left pants leg, Thread/unthread right pants leg, Pull pants up/down Lower body dressing/undressing: 5: Supervision: Safety issues/verbal cues  FIM - Toileting Toileting steps completed by patient: Adjust clothing prior to toileting, Performs perineal hygiene Toileting: 5: Set-up assist to: Obtain supplies  FIM - Diplomatic Services operational officerToilet Transfers Toilet Transfers Assistive Devices: Grab bars, Elevated toilet seat Toilet Transfers: 5-To toilet/BSC: Supervision (verbal cues/safety issues), 5-From toilet/BSC: Supervision (verbal cues/safety issues)  FIM - BankerBed/Chair Transfer Bed/Chair Transfer Assistive Devices: Environmental consultantWalker [phone]([phone]) Bed/Chair Transfer: 5: Bed > Chair or W/C: Supervision (verbal cues/safety issues), 5: Chair or W/C > Bed: Supervision (verbal cues/safety issues)  FIM - Locomotion: Wheelchair Distance: 150 Locomotion: Wheelchair: 0: Activity did not occur FIM - Locomotion: Ambulation Locomotion: Ambulation Assistive Devices: Other (comment) (0RU(4WW) Ambulation/Gait Assistance: 5: Supervision Locomotion: Ambulation: 5: Travels 150 ft or more with supervision/safety issues  Comprehension Comprehension Mode: Auditory Comprehension: 4-Understands basic 75 - 89% of the time/requires cueing 10 - 24% of the time  Expression Expression Mode: Verbal Expression: 5-Expresses basic 90% of the time/requires cueing < 10% of the time.  Social Interaction Social Interaction: 4-Interacts appropriately 75 - 89% of the time - Needs redirection for appropriate language or to initiate interaction.  Problem Solving Problem Solving: 3-Solves basic 50 - 74% of the time/requires cueing 25 - 49% of the time  Memory Memory: 3-Recognizes or recalls 50 - 74% of the time/requires cueing 25 - 49% of the time  Medical Problem List and Plan: 1. 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  DVT Prophylaxis/Anticoagulation: SQ heparin.Monitor platelet counts and any signs of bleeding 3. Pain Management: tylenol as needed 4. Hypertension.Lisinopril 10 mg daily.Monitor with increased mobility 5. Neuropsych: This patient is capable of making decisions on his own behalf. 6. Skin/Wound Care: routine skin checks 7. Fluids/Electrolytes/Nutrition: strict I&O,s  Follow up labs 8.Hyperlipidemia. Lipitor 40 mg daily 9.Diabetes mellitus with peripheral neuropathy. HGB A1c 6.4.Check blood sugars AC&HS.Patient on Glucophage 1500 mg Qam and 500 mg pm prior to admission.CBGs fine on diet control   LOS (Days) 9 A FACE TO FACE EVALUATION WAS PERFORMED  Brendia Dampier E 07/05/2014, 8:08 AM

## 2014-07-05 NOTE — Plan of Care (Signed)
Problem: RH Memory Goal: LTG Patient will demonstrate ability for day to day (SLP) LTG: Patient will demonstrate ability for day to day recall/carryover during cognitive/linguistic activities with assist (SLP)  Outcome: Completed/Met Date Met:  07/05/14  Problem: RH Awareness Goal: LTG: Patient will demonstrate intellectual/emergent (SLP) LTG: Patient will demonstrate intellectual/emergent/anticipatory awareness with assist during a cognitive/linguistic activity (SLP)  Outcome: Completed/Met Date Met:  07/05/14  Problem: RH Expression Communication Goal: LTG Patient will increase speech intelligibility (SLP) LTG: Patient will increase speech intelligibility at word/phrase/conversation level with cues, % of the time (SLP)  Outcome: Completed/Met Date Met:  07/05/14

## 2014-07-05 NOTE — Progress Notes (Signed)
Social Work Discharge Note  The overall goal for the admission was met for:   Discharge location: Yes - home  Length of Stay: Yes - 9 days  Discharge activity level: Yes - supervision  Home/community participation: Yes  Services provided included: MD, RD, PT, OT, SLP, RN, Pharmacy and SW  Financial Services: Medicare  Follow-up services arranged: Home Health: PT/OT and DME: rollator, tub bench, 3-in-1 commode  Comments (or additional information):  Pt to return home to his sister's home where he has been living.  She is arranging for someone to be with him when she is at work.  She is home by 3:15 daily.  Patient/Family verbalized understanding of follow-up arrangements: Yes  Individual responsible for coordination of the follow-up plan: pt's sister  Confirmed correct DME delivered: Trey Sailors 07/05/2014    Ameli Sangiovanni, Silvestre Mesi

## 2014-07-05 NOTE — Discharge Instructions (Signed)
Inpatient Rehab Discharge Instructions  [name] Williams Discharge date and time: No discharge date for patient encounter.   Activities/Precautions/ Functional Status: Activity: activity as tolerated Diet: diabetic diet Wound Care: none needed Functional status:  ___ No restrictions     ___ Walk up steps independently ___ 24/7 supervision/assistance   ___ Walk up steps with assistance ___ Intermittent supervision/assistance  ___ Bathe/dress independently ___ Walk with walker     ___ Bathe/dress with assistance ___ Walk Independently    ___ Shower independently __x STROKE/TIA DISCHARGE INSTRUCTIONS SMOKING Cigarette smoking nearly doubles your risk of having a stroke & is the single most alterable risk factor  If you smoke or have smoked in the last 12 months, you are advised to quit smoking for your health.  Most of the excess cardiovascular risk related to smoking disappears within a year of stopping.  Ask you doctor about anti-smoking medications  [place] Quit Line: 1-800-QUIT NOW  Free Smoking Cessation Classes [phone]  CHOLESTEROL Know your levels; limit fat & cholesterol in your diet  Lipid Panel     Component Value Date/Time   CHOL 118 06/23/2014 0645   TRIG 44 06/23/2014 0645   HDL 31* 06/23/2014 0645   CHOLHDL 3.8 06/23/2014 0645   VLDL 9 06/23/2014 0645   LDLCALC 78 06/23/2014 0645      Many patients benefit from treatment even if their cholesterol is at goal.  Goal: Total Cholesterol (CHOL) less than 160  Goal:  Triglycerides (TRIG) less than 150  Goal:  HDL greater than 40  Goal:  LDL (LDLCALC) less than 100   BLOOD PRESSURE American Stroke Association blood pressure target is less that 120/80 mm/Hg  Your discharge blood pressure is:  BP: 116/70 mmHg  Monitor your blood pressure  Limit your salt and alcohol intake  Many individuals will require more than one medication for high blood pressure  DIABETES (A1c is a blood sugar average for last 3 months)  Goal HGBA1c is under 7% (HBGA1c is blood sugar average for last 3 months)  Diabetes:     Lab Results  Component Value Date   HGBA1C 6.4* 06/23/2014     Your HGBA1c can be lowered with medications, healthy diet, and exercise.  Check your blood sugar as directed by your physician  Call your physician if you experience unexplained or low blood sugars.  PHYSICAL ACTIVITY/REHABILITATION Goal is 30 minutes at least 4 days per week  Activity: Increase activity slowly, Therapies: Physical Therapy: Home Health Return to work:   Activity decreases your risk of heart attack and stroke and makes your heart stronger.  It helps control your weight and blood pressure; helps you relax and can improve your mood.  Participate in a regular exercise program.  Talk with your doctor about the best form of exercise for you (dancing, walking, swimming, cycling).  DIET/WEIGHT Goal is to maintain a healthy weight  Your discharge diet is: Diet heart healthy/carb modified  liquids Your height is:  Height: 5\' 10"  (177.8 cm) Your current weight is: Weight: 72.031 kg (158 lb 12.8 oz) Your Body Mass Index (BMI) is:  BMI (Calculated): 22.8  Following the type of diet specifically designed for you will help prevent another stroke.  Your goal weight range is:    Your goal Body Mass Index (BMI) is 19-24.  Healthy food habits can help reduce 3 risk factors for stroke:  High cholesterol, hypertension, and excess weight.  RESOURCES Stroke/Support Group:  Call [phone]   STROKE EDUCATION PROVIDED/REVIEWED  AND GIVEN TO PATIENT Stroke warning signs and symptoms How to activate emergency medical system (call 911). Medications prescribed at discharge. Need for follow-up after discharge. Personal risk factors for stroke. Pneumonia vaccine given:  Flu vaccine given:  My questions have been answered, the writing is legible, and I understand these instructions.  I will adhere to these goals & educational materials  that have been provided to me after my discharge from the hospital.    _ Walk with assistance    ___ Shower with assistance ___ No alcohol     ___ Return to work/school ________  COMMUNITY REFERRALS UPON DISCHARGE:   Home Health:   PT     OT  Agency:  [facility] Phone:  [phone] Medical Equipment/Items Ordered:  Rollator, tub bench, 3-in-1 commode  Agency/Supplier:  [facility]       Phone:  [phone]  GENERAL COMMUNITY RESOURCES FOR PATIENT/FAMILY: Support Groups:  [facility]                              Meets every 3rd Sunday of the month (except for June, July , August) at 3:00PM                              [facility]  Special Instructions:    My questions have been answered and I understand these instructions. I will adhere to these goals and the provided educational materials after my discharge from the hospital.  Patient/Caregiver Signature _______________________________ Date __________  Clinician Signature _______________________________________ Date __________  Please bring this form and your medication list with you to all your follow-up doctor's appointments.

## 2014-07-08 ENCOUNTER — Telehealth: Payer: Self-pay | Admitting: Physical Medicine & Rehabilitation

## 2014-07-08 NOTE — Telephone Encounter (Signed)
Called Threasa AlphaJim Hoffman back and gave the verbal orders for both.

## 2014-07-08 NOTE — Telephone Encounter (Signed)
Kathrene BongoJames Hoffman called from [facility]. In his evaluation, he determined that [name] needed social worker and speech therapy. He would like a verbal order for both.

## 2014-07-08 NOTE — Telephone Encounter (Signed)
Child psychotherapistsocial worker and speech therapy evaluation through home health

## 2014-07-10 ENCOUNTER — Telehealth: Payer: Self-pay | Admitting: *Deleted

## 2014-07-10 NOTE — Telephone Encounter (Signed)
Called for a continuation on PT orders 2 visits for 2 weeks, 1 visit for 1 week, authorization given per office protocol

## 2014-07-17 ENCOUNTER — Ambulatory Visit (INDEPENDENT_AMBULATORY_CARE_PROVIDER_SITE_OTHER): Payer: Medicare Other | Admitting: Internal Medicine

## 2014-07-17 ENCOUNTER — Encounter: Payer: Self-pay | Admitting: Internal Medicine

## 2014-07-17 VITALS — BP 121/75 | HR 100 | Temp 98.4°F | Wt 163.8 lb

## 2014-07-17 DIAGNOSIS — I639 Cerebral infarction, unspecified: Secondary | ICD-10-CM

## 2014-07-17 DIAGNOSIS — I69354 Hemiplegia and hemiparesis following cerebral infarction affecting left non-dominant side: Secondary | ICD-10-CM

## 2014-07-17 NOTE — Progress Notes (Signed)
   Subjective:    Patient ID: [name] NeerJames Bargar, male    DOB: Dec 20, 1955, 58 y.o.   MRN: [id number]  HPI Pt is a 58 y/o male w/ PMHx of HTN, DM2, HLD, and hx of CVA in 2011 who presents to clinic for hospital f/u. [facility] was [facility] 06/[facility]/14 for lt sided weakness (discrepancy in H&P notes regarding side of weakness, per further notes it is actually the left side). MRI was negative for acute infarct. Neurology saw pt and believe he may have suffered infarct from posterior circulation and recommended medical therapy vs treating ICA stenosis( 06/24/14--Rt ICA doppler severely diminished consistent with possible occlusion; left ICA 40-59% upper ICA stenosis w/ ICA/CCA ratio suggesting 60-79% stenosis).  Pt started on lisinopril 10mg , atorvastin 40mg  daily, and plavix 75mg  during that admission. Pt brought in list of current medications, reports compliance with them and denies any need for refills at this time. Pt lives with his sister and states he is doing well today.    Review of Systems  HENT: Negative for trouble swallowing.   Eyes: Negative for visual disturbance.  Neurological:       Neg for recent falls   Psychiatric/Behavioral: Negative.        Objective:   Physical Exam  Constitutional: He is oriented to person, place, and time. He appears well-developed and well-nourished. No distress.  HENT:  Mouth/Throat: Oropharynx is clear and moist. No oropharyngeal exudate.  Eyes: EOM are normal. Pupils are equal, round, and reactive to light.  Cardiovascular: Normal rate and regular rhythm.   Pulmonary/Chest: Effort normal and breath sounds normal. He has no wheezes.  Abdominal: Soft. Bowel sounds are normal.  Musculoskeletal:  Decreased hand grip on the left, 5/5 LE strength  Neurological: He is alert and oriented to person, place, and time.  Skin: Skin is warm and dry.          Assessment & Plan:  Please see problem based assessment and plan.

## 2014-07-17 NOTE — Patient Instructions (Addendum)
Continue taking plavix and aspirin.

## 2014-07-18 NOTE — Assessment & Plan Note (Addendum)
Pt admitted [facility] on 11/8 for left sided weakness 2/2 posterior circulation infarct. ECHO neg for embolic source or PFO. Carotid dopplers reveal Rt ICA doppler severely diminished consistent with possible occlusion; left ICA 40-59% upper ICA stenosis w/ ICA/CCA ratio suggesting 60-79% stenosis. HbA1c 6.4. BP 121/75, well controlled.  - Will continue with current medical management of lisinopril 10mg , atorvastin 40mg  daily, and plavix 75mg  - f/u in 3 months

## 2014-07-21 NOTE — Progress Notes (Signed)
I saw and evaluated the patient. I personally confirmed the key portions of Dr. Truong's history and exam and reviewed pertinent patient test results. The assessment, diagnosis, and plan were formulated together and I agree with the documentation in the resident's note. 

## 2014-07-23 DIAGNOSIS — I6931 Cognitive deficits following cerebral infarction: Secondary | ICD-10-CM | POA: Diagnosis not present

## 2014-07-23 DIAGNOSIS — I69393 Ataxia following cerebral infarction: Secondary | ICD-10-CM | POA: Diagnosis not present

## 2014-07-23 DIAGNOSIS — I69354 Hemiplegia and hemiparesis following cerebral infarction affecting left non-dominant side: Secondary | ICD-10-CM | POA: Diagnosis not present

## 2014-07-23 DIAGNOSIS — E119 Type 2 diabetes mellitus without complications: Secondary | ICD-10-CM | POA: Diagnosis not present

## 2014-07-25 ENCOUNTER — Encounter: Payer: Self-pay | Admitting: Podiatry

## 2014-07-25 ENCOUNTER — Ambulatory Visit (INDEPENDENT_AMBULATORY_CARE_PROVIDER_SITE_OTHER): Payer: Medicare Other | Admitting: Podiatry

## 2014-07-25 DIAGNOSIS — B351 Tinea unguium: Secondary | ICD-10-CM

## 2014-07-25 DIAGNOSIS — M79673 Pain in unspecified foot: Secondary | ICD-10-CM

## 2014-07-25 NOTE — Patient Instructions (Signed)
Diabetes and Foot Care Diabetes may cause you to have problems because of poor blood supply (circulation) to your feet and legs. This may cause the skin on your feet to become thinner, break easier, and heal more slowly. Your skin may become dry, and the skin may peel and crack. You may also have nerve damage in your legs and feet causing decreased feeling in them. You may not notice minor injuries to your feet that could lead to infections or more serious problems. Taking care of your feet is one of the most important things you can do for yourself.  HOME CARE INSTRUCTIONS  Wear shoes at all times, even in the house. Do not go barefoot. Bare feet are easily injured.  Check your feet daily for blisters, cuts, and redness. If you cannot see the bottom of your feet, use a mirror or ask someone for help.  Wash your feet with warm water (do not use hot water) and mild soap. Then pat your feet and the areas between your toes until they are completely dry. Do not soak your feet as this can dry your skin.  Apply a moisturizing lotion or petroleum jelly (that does not contain alcohol and is unscented) to the skin on your feet and to dry, brittle toenails. Do not apply lotion between your toes.  Trim your toenails straight across. Do not dig under them or around the cuticle. File the edges of your nails with an emery board or nail file.  Do not cut corns or calluses or try to remove them with medicine.  Wear clean socks or stockings every day. Make sure they are not too tight. Do not wear knee-high stockings since they may decrease blood flow to your legs.  Wear shoes that fit properly and have enough cushioning. To break in new shoes, wear them for just a few hours a day. This prevents you from injuring your feet. Always look in your shoes before you put them on to be sure there are no objects inside.  Do not cross your legs. This may decrease the blood flow to your feet.  If you find a minor scrape,  cut, or break in the skin on your feet, keep it and the skin around it clean and dry. These areas may be cleansed with mild soap and water. Do not cleanse the area with peroxide, alcohol, or iodine.  When you remove an adhesive bandage, be sure not to damage the skin around it.  If you have a wound, look at it several times a day to make sure it is healing.  Do not use heating pads or hot water bottles. They may burn your skin. If you have lost feeling in your feet or legs, you may not know it is happening until it is too late.  Make sure your health care provider performs a complete foot exam at least annually or more often if you have foot problems. Report any cuts, sores, or bruises to your health care provider immediately. SEEK MEDICAL CARE IF:   You have an injury that is not healing.  You have cuts or breaks in the skin.  You have an ingrown nail.  You notice redness on your legs or feet.  You feel burning or tingling in your legs or feet.  You have pain or cramps in your legs and feet.  Your legs or feet are numb.  Your feet always feel cold. SEEK IMMEDIATE MEDICAL CARE IF:   There is increasing redness,   swelling, or pain in or around a wound.  There is a red line that goes up your leg.  Pus is coming from a wound.  You develop a fever or as directed by your health care provider.  You notice a bad smell coming from an ulcer or wound. Document Released: 07/30/2000 Document Revised: 04/04/2013 Document Reviewed: 01/09/2013 [employer] Patient Information 2015 [employer]. This information is not intended to replace advice given to you by your health care provider. Make sure you discuss any questions you have with your health care provider.  

## 2014-07-27 NOTE — Progress Notes (Signed)
Subjective:     Patient ID: [name] Williams, male   DOB: 26-Jun-1956, 58 y.o.   MRN: [id number]  HPI patient presents with thick yellow brittle nailbeds 1-5 both feet that are painful and he is not able to cut himself   Review of Systems     Objective:   Physical Exam Neurovascular status intact with thick yellow brittle nailbeds 1-5 both feet that are painful when pressed    Assessment:     Mycotic nail infection with pain 1-5 both feet    Plan:     Debride painful nailbeds 1-5 both feet with no iatrogenic bleeding noted

## 2014-07-31 ENCOUNTER — Telehealth: Payer: Self-pay | Admitting: *Deleted

## 2014-07-31 NOTE — Telephone Encounter (Signed)
[name] MouldingRuth is calling to extend visits 1wk2 to educated [name] Williams on tub bathing which is his preference.  Approval given.

## 2014-08-02 ENCOUNTER — Inpatient Hospital Stay: Payer: Medicare Other | Admitting: Physical Medicine & Rehabilitation

## 2014-08-15 ENCOUNTER — Telehealth: Payer: Self-pay | Admitting: *Deleted

## 2014-08-15 NOTE — Telephone Encounter (Signed)
Speech pathologist asking for verbal orders for 1 more visit to address pt's swallow safety, verbal order given per office protocol

## 2014-08-19 ENCOUNTER — Encounter: Payer: Self-pay | Admitting: Physical Medicine & Rehabilitation

## 2014-08-19 ENCOUNTER — Encounter: Payer: Medicare Other | Attending: Physical Medicine & Rehabilitation

## 2014-08-19 ENCOUNTER — Ambulatory Visit (HOSPITAL_BASED_OUTPATIENT_CLINIC_OR_DEPARTMENT_OTHER): Payer: Medicare Other | Admitting: Physical Medicine & Rehabilitation

## 2014-08-19 VITALS — BP 142/74 | HR 69 | Resp 14

## 2014-08-19 DIAGNOSIS — I638 Other cerebral infarction: Secondary | ICD-10-CM | POA: Diagnosis not present

## 2014-08-19 DIAGNOSIS — R270 Ataxia, unspecified: Secondary | ICD-10-CM | POA: Diagnosis present

## 2014-08-19 DIAGNOSIS — I639 Cerebral infarction, unspecified: Secondary | ICD-10-CM

## 2014-08-19 DIAGNOSIS — I69993 Ataxia following unspecified cerebrovascular disease: Secondary | ICD-10-CM

## 2014-08-19 NOTE — Patient Instructions (Signed)
Ischemic Stroke Blood carries oxygen to all areas of your body. A stroke happens when your blood does not flow to your brain like normal. If this happens, your brain will not get the oxygen it needs and brain tissue will die. This is an emergency. Problems (symptoms) of a stroke usually happen suddenly. You may notice them when you wake up. They can include:  Loss of feeling or weakness on one side of the body (face, arm, leg).  Feeling confused.  Trouble talking or understanding.  Trouble seeing.  Trouble walking.  Feeling dizzy.  Loss of balance or coordination.  Severe headache without a cause.  Trouble reading or writing. Get help as soon as any of these problems first start. This is important.  RISK FACTORS  Risk factors are things that make it more likely for you to have a stroke. These things include:  High blood pressure (hypertension).  High cholesterol.  Diabetes.  Heart disease.  Having a buildup of fatty deposits in the blood vessels.  Having an abnormal heart rhythm (atrial fibrillation).  Being very overweight (obese).  Smoking.  Taking birth control pills, especially if you smoke.  **Not being active.  **Having a diet high in fats, salt, and calories.  Drinking too much alcohol.  Using illegal drugs.  Being African American.  Being over the age of 55.  Having a family history of stroke.  Having a history of blood clots, stroke, warning stroke (transient ischemic attack, TIA), or heart attack.  Sickle cell disease. HOME CARE  Take all medicines exactly as told by your doctor. Understand all your medicine instructions.  You may need to take a medicine to thin your blood, like aspirin or warfarin. Take warfarin exactly as told.  Taking too much or too little warfarin is dangerous. Get regular blood tests as told, including the PT and INR tests. The test results help your doctor adjust your dose of warfarin. Your PT and INR levels must be  done as often as told by your doctor.  Food can cause problems with warfarin and affect the results of your blood tests. This is true for foods high in vitamin K, such as spinach, kale, broccoli, cabbage, collard and turnip greens, Brussels sprouts, peas, cauliflower, seaweed, and parsley, as well as beef and pork liver, green tea, and soybean oil. Eat the same amount of food high in vitamin K. Avoid major changes in your diet. Tell your doctor before changing your diet. Talk to a food specialist (dietitian) if you have questions.  Many medicines can cause problems with warfarin and affect your PT and INR test results. Tell your doctor about all medicines you take. This includes vitamins and dietary pills (supplements). Be careful with aspirin and medicines that relieve redness, soreness, and puffiness (inflammation). Do not take or stop medicines unless your doctor tells you to.  Warfarin can cause a lot of bruising or bleeding. Hold pressure over cuts for longer than normal. Talk to your doctor about other side effects of warfarin.  Avoid sports or activities that may cause injury or bleeding.  Be careful when you shave, floss your teeth, or use sharp objects.  Avoid alcoholic drinks or drink very little alcohol while taking warfarin. Tell your doctor if you change how much alcohol you drink.  Tell your dentist and other doctors that you take warfarin before procedures.  If you are able to swallow, eat healthy foods. Eat 5 or more servings of fruits and vegetables a day. Eat soft   foods, pureed foods, or eat small bites of food so you do not choke.  Follow your diet program as told, if you are given one.  Keep a healthy weight.  Stay active. Try to get at least 30 minutes of activity on most or all days.  Do not smoke.  Limit how much alcohol you drink even if you are not taking warfarin. Moderate alcohol use is:  No more than 2 drinks each day for men.  No more than 1 drink each day  for women who are not pregnant.  Keep your home safe so you do not fall. Try:  Putting grab bars in the bedroom and bathroom.  Raising toilet seats.  Putting a seat in the shower.  Go to therapy sessions (physical, occupational, and speech) as told by your doctor.  Use a walker or cane at all times if told to do so.  Keep all doctor visits as told. GET HELP IF:  Your personality changes.  You have trouble swallowing.  You are seeing two of everything.  You are dizzy.  You have a fever.  Your skin starts to break down. GET HELP RIGHT AWAY IF:  The symptoms below may be a sign of an emergency. Do not wait to see if the symptoms go away. Call for help (911 in [place] Do not drive yourself to the hospital.  You have sudden weakness or numbness on the face, arm, or leg (especially on one side of the body).  You have sudden trouble walking or moving your arms or legs.  You have sudden confusion.  You have trouble talking or understanding.  You have sudden trouble seeing in one or both eyes.  You lose your balance or your movements are not smooth.  You have a sudden, severe headache with no known cause.  You have new chest pain or you feel your heart beating in an unsteady way.  You are partly or totally unaware of what is going on around you. Document Released: 07/22/2011 Document Revised: 12/17/2013 Document Reviewed: 03/12/2012 ExitCare Patient Information 2015 [employer]. This information is not intended to replace advice given to you by your health care provider. Make sure you discuss any questions you have with your health care provider.  

## 2014-08-19 NOTE — Progress Notes (Signed)
Subjective:    Patient ID: [name] Williams, male    DOB: Sep 10, 1955, 59 y.o.   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  The patient lives with his sister, was independent prior to admission.  Admitted on June 23, 2014, with increased left-sided weakness and slurred speech as well as blurred vision.  MRI showed no acute infarct.  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  Neurology followup suspect posterior circulation infarcts secondary to small vessel disease.  Echocardiogram with ejection fraction of 50%, grade 1 diastolic dysfunction.  Carotid Dopplers with left 40-59% ICA stenosis. Urine drug screen upon admission positive for marijuana. The patient did not receive tPA. DATE OF ADMISSION:  06/26/2014 DATE OF DISCHARGE:  07/05/2014  Patient is dressing and bathing himself. Reports no falls at home. Uses a walker at home. Has also followed with home health PT and OT. They're doing some walking outside the house with the walker up to 1 block. Patient living with his sister however she works during the day  Patient is awaiting admission to adult daycare at [facility] Pain Inventory Average Pain 2 Pain Right Now 0 My pain is dull  In the last 24 hours, has pain interfered with the following? General activity 2 Relation with others 0 Enjoyment of life 1 What TIME of day is your pain at its worst? unsure Sleep (in general) Good  Pain is worse with: unsure Pain improves with: no pain medications Relief from Meds: no selection  Mobility walk without assistance ability to climb steps?  yes do you drive?  no  Function disabled: date disabled .  Neuro/Psych No problems in this  area  Prior Studies Any changes since last visit?  no  Physicians involved in your care Any changes since last visit?  no   Family History  Problem Relation Age of Onset  . Hypertension Mother   . Hypertension Father   . Coronary artery disease Maternal Aunt   . Diabetes Maternal Aunt   . Coronary artery disease Maternal Uncle   . Diabetes Maternal Uncle   . Diabetes Paternal Aunt   . Coronary artery disease Paternal Aunt   . Diabetes Paternal Uncle   . Coronary artery disease Paternal Uncle    History   Social History  . Marital Status: Legally Separated    Spouse Name: N/A    Number of Children: 0  . Years of Education: 12th   Social History Main Topics  . Smoking status: Current Every Day Smoker -- 1.00 packs/day for 10 years    Types: Cigarettes  . Smokeless tobacco: None  . Alcohol Use: No  . Drug Use: Yes     Comment: marijuana occassionally   . Sexual Activity: No   Other Topics Concern  . None   Social History Narrative   Does regular exercise.   Has never worked, sister takes care of him   History reviewed. No pertinent past surgical history. Past Medical History  Diagnosis Date  . Diabetes mellitus   . Hypertension   . History of stroke    BP 142/74 mmHg  Pulse 69  Resp 14  SpO2 99%  Opioid Risk Score:   Fall Risk Score:   Review of Systems  All other systems reviewed and are  negative.      Objective:   Physical Exam Partial INO, unable to adduct right pupil,  Left hand intrinsic wasting with decreased sensation left fifth digit No right hand wasting, decreased sensation in the right hand to light touch  Motor strength is 4/5 in the right deltoid, biceps, triceps, grip, hip flexor, knee extensor, ankle dorsiflexor 5 minus/5 on the left side       Assessment & Plan:  1. Brainstem infarct most recent infarct was not apparent on MRI however prior infarcts left pontine as well asRight cerebellar were apparent. Overall the patient is  making improvements. He is being discharged from home health therapy we did discuss outpatient therapy however he has transportation issues at the current time. He does plan to start at adult daycare and they have some therapy services as well by the patient's report.  Will return to clinic on an as-needed basis. PCP is Dr Gara Kroner at [facility]

## 2014-09-13 DIAGNOSIS — I739 Peripheral vascular disease, unspecified: Secondary | ICD-10-CM | POA: Diagnosis not present

## 2014-09-13 DIAGNOSIS — Z720 Tobacco use: Secondary | ICD-10-CM

## 2014-09-13 DIAGNOSIS — F209 Schizophrenia, unspecified: Secondary | ICD-10-CM

## 2014-09-13 DIAGNOSIS — E785 Hyperlipidemia, unspecified: Secondary | ICD-10-CM

## 2014-09-13 DIAGNOSIS — I6931 Cognitive deficits following cerebral infarction: Secondary | ICD-10-CM | POA: Diagnosis not present

## 2014-09-13 DIAGNOSIS — I10 Essential (primary) hypertension: Secondary | ICD-10-CM | POA: Diagnosis not present

## 2014-09-13 DIAGNOSIS — I69393 Ataxia following cerebral infarction: Secondary | ICD-10-CM | POA: Diagnosis not present

## 2014-09-13 DIAGNOSIS — E119 Type 2 diabetes mellitus without complications: Secondary | ICD-10-CM

## 2014-10-15 ENCOUNTER — Other Ambulatory Visit: Payer: Self-pay | Admitting: Internal Medicine

## 2014-10-18 ENCOUNTER — Ambulatory Visit: Payer: Medicare Other | Admitting: Physical Medicine & Rehabilitation

## 2014-10-18 ENCOUNTER — Encounter: Payer: Medicare Other | Attending: Physical Medicine & Rehabilitation

## 2014-10-18 DIAGNOSIS — I638 Other cerebral infarction: Secondary | ICD-10-CM | POA: Insufficient documentation

## 2014-10-18 DIAGNOSIS — R270 Ataxia, unspecified: Secondary | ICD-10-CM | POA: Insufficient documentation

## 2014-10-28 ENCOUNTER — Encounter: Payer: Self-pay | Admitting: Podiatry

## 2014-10-28 ENCOUNTER — Ambulatory Visit (INDEPENDENT_AMBULATORY_CARE_PROVIDER_SITE_OTHER): Payer: Medicare Other | Admitting: Podiatry

## 2014-10-28 DIAGNOSIS — B351 Tinea unguium: Secondary | ICD-10-CM | POA: Diagnosis not present

## 2014-10-28 DIAGNOSIS — M79673 Pain in unspecified foot: Secondary | ICD-10-CM | POA: Diagnosis not present

## 2014-10-28 NOTE — Progress Notes (Signed)
   Subjective:    Patient ID: [name] Williams, male    DOB: [birth year], 59 y.o.   MRN: [id number]  HPI  debridement 10 toes  Review of Systems  All other systems reviewed and are negative.      Objective:   Physical Exam        Assessment & Plan:

## 2014-10-28 NOTE — Progress Notes (Signed)
Subjective:     Patient ID: [name] Williams, male   DOB: 1956/06/23, 59 y.o.   MRN: [id number]  HPI patient presents with thick yellow brittle nailbeds 1-5 both feet that are painful and he is not able to cut himself   Review of Systems     Objective:   Physical Exam Neurovascular status intact with thick yellow brittle nailbeds 1-5 both feet that are painful when pressed    Assessment:     Mycotic nail infection with pain 1-5 both feet    Plan:     Debride painful nailbeds 1-5 both feet with no iatrogenic bleeding noted

## 2014-10-31 ENCOUNTER — Encounter: Payer: Self-pay | Admitting: Family Medicine

## 2014-10-31 DIAGNOSIS — F209 Schizophrenia, unspecified: Secondary | ICD-10-CM

## 2014-10-31 HISTORY — DX: Schizophrenia, unspecified: F20.9

## 2014-12-31 ENCOUNTER — Encounter: Payer: Self-pay | Admitting: Internal Medicine

## 2015-03-03 ENCOUNTER — Ambulatory Visit: Payer: Medicare Other

## 2015-03-06 NOTE — Telephone Encounter (Signed)
Open in eror

## 2016-05-06 ENCOUNTER — Other Ambulatory Visit: Payer: Self-pay | Admitting: Family Medicine

## 2016-05-06 ENCOUNTER — Ambulatory Visit
Admission: RE | Admit: 2016-05-06 | Discharge: 2016-05-06 | Disposition: A | Discharge status: Home / Self Care | Payer: No Typology Code available for payment source | Source: Ambulatory Visit | Attending: Family Medicine | Admitting: Family Medicine

## 2016-05-06 DIAGNOSIS — R05 Cough: Secondary | ICD-10-CM

## 2016-05-06 DIAGNOSIS — R059 Cough, unspecified: Secondary | ICD-10-CM

## 2017-10-04 ENCOUNTER — Emergency Department (HOSPITAL_COMMUNITY): Payer: Medicare (Managed Care)

## 2017-10-04 ENCOUNTER — Other Ambulatory Visit: Payer: Self-pay

## 2017-10-04 ENCOUNTER — Encounter (HOSPITAL_COMMUNITY): Payer: Self-pay | Admitting: Emergency Medicine

## 2017-10-04 ENCOUNTER — Inpatient Hospital Stay (HOSPITAL_COMMUNITY)
Admission: EM | Admit: 2017-10-04 | Discharge: 2017-10-10 | DRG: 872 | Disposition: A | Discharge status: Skilled Nursing Facility | Payer: Medicare (Managed Care) | Attending: Family Medicine | Admitting: Family Medicine

## 2017-10-04 DIAGNOSIS — R748 Abnormal levels of other serum enzymes: Secondary | ICD-10-CM | POA: Diagnosis not present

## 2017-10-04 DIAGNOSIS — G9340 Encephalopathy, unspecified: Secondary | ICD-10-CM | POA: Diagnosis present

## 2017-10-04 DIAGNOSIS — Y92009 Unspecified place in unspecified non-institutional (private) residence as the place of occurrence of the external cause: Secondary | ICD-10-CM

## 2017-10-04 DIAGNOSIS — I6529 Occlusion and stenosis of unspecified carotid artery: Secondary | ICD-10-CM | POA: Diagnosis present

## 2017-10-04 DIAGNOSIS — A4159 Other Gram-negative sepsis: Principal | ICD-10-CM | POA: Diagnosis present

## 2017-10-04 DIAGNOSIS — Z66 Do not resuscitate: Secondary | ICD-10-CM | POA: Diagnosis present

## 2017-10-04 DIAGNOSIS — R7881 Bacteremia: Secondary | ICD-10-CM | POA: Diagnosis not present

## 2017-10-04 DIAGNOSIS — Z7984 Long term (current) use of oral hypoglycemic drugs: Secondary | ICD-10-CM

## 2017-10-04 DIAGNOSIS — E1122 Type 2 diabetes mellitus with diabetic chronic kidney disease: Secondary | ICD-10-CM | POA: Diagnosis present

## 2017-10-04 DIAGNOSIS — R402413 Glasgow coma scale score 13-15, at hospital admission: Secondary | ICD-10-CM | POA: Diagnosis present

## 2017-10-04 DIAGNOSIS — I679 Cerebrovascular disease, unspecified: Secondary | ICD-10-CM

## 2017-10-04 DIAGNOSIS — R159 Full incontinence of feces: Secondary | ICD-10-CM | POA: Diagnosis present

## 2017-10-04 DIAGNOSIS — I639 Cerebral infarction, unspecified: Secondary | ICD-10-CM | POA: Diagnosis not present

## 2017-10-04 DIAGNOSIS — I2510 Atherosclerotic heart disease of native coronary artery without angina pectoris: Secondary | ICD-10-CM | POA: Diagnosis present

## 2017-10-04 DIAGNOSIS — D72829 Elevated white blood cell count, unspecified: Secondary | ICD-10-CM | POA: Insufficient documentation

## 2017-10-04 DIAGNOSIS — R29705 NIHSS score 5: Secondary | ICD-10-CM | POA: Diagnosis present

## 2017-10-04 DIAGNOSIS — R4182 Altered mental status, unspecified: Secondary | ICD-10-CM | POA: Diagnosis present

## 2017-10-04 DIAGNOSIS — I672 Cerebral atherosclerosis: Secondary | ICD-10-CM | POA: Diagnosis present

## 2017-10-04 DIAGNOSIS — R1310 Dysphagia, unspecified: Secondary | ICD-10-CM | POA: Diagnosis present

## 2017-10-04 DIAGNOSIS — N189 Chronic kidney disease, unspecified: Secondary | ICD-10-CM | POA: Diagnosis present

## 2017-10-04 DIAGNOSIS — R197 Diarrhea, unspecified: Secondary | ICD-10-CM | POA: Diagnosis present

## 2017-10-04 DIAGNOSIS — N390 Urinary tract infection, site not specified: Secondary | ICD-10-CM | POA: Diagnosis present

## 2017-10-04 DIAGNOSIS — Z8673 Personal history of transient ischemic attack (TIA), and cerebral infarction without residual deficits: Secondary | ICD-10-CM | POA: Diagnosis not present

## 2017-10-04 DIAGNOSIS — I130 Hypertensive heart and chronic kidney disease with heart failure and stage 1 through stage 4 chronic kidney disease, or unspecified chronic kidney disease: Secondary | ICD-10-CM | POA: Diagnosis present

## 2017-10-04 DIAGNOSIS — R7989 Other specified abnormal findings of blood chemistry: Secondary | ICD-10-CM

## 2017-10-04 DIAGNOSIS — A419 Sepsis, unspecified organism: Secondary | ICD-10-CM

## 2017-10-04 DIAGNOSIS — I7389 Other specified peripheral vascular diseases: Secondary | ICD-10-CM | POA: Diagnosis present

## 2017-10-04 DIAGNOSIS — B961 Klebsiella pneumoniae [K. pneumoniae] as the cause of diseases classified elsewhere: Secondary | ICD-10-CM | POA: Diagnosis present

## 2017-10-04 DIAGNOSIS — I110 Hypertensive heart disease with heart failure: Secondary | ICD-10-CM

## 2017-10-04 DIAGNOSIS — G7281 Critical illness myopathy: Secondary | ICD-10-CM | POA: Diagnosis present

## 2017-10-04 DIAGNOSIS — R42 Dizziness and giddiness: Secondary | ICD-10-CM | POA: Diagnosis present

## 2017-10-04 DIAGNOSIS — E785 Hyperlipidemia, unspecified: Secondary | ICD-10-CM | POA: Diagnosis present

## 2017-10-04 DIAGNOSIS — I69351 Hemiplegia and hemiparesis following cerebral infarction affecting right dominant side: Secondary | ICD-10-CM | POA: Diagnosis not present

## 2017-10-04 DIAGNOSIS — Z7902 Long term (current) use of antithrombotics/antiplatelets: Secondary | ICD-10-CM

## 2017-10-04 DIAGNOSIS — F209 Schizophrenia, unspecified: Secondary | ICD-10-CM | POA: Diagnosis present

## 2017-10-04 DIAGNOSIS — I5022 Chronic systolic (congestive) heart failure: Secondary | ICD-10-CM | POA: Diagnosis present

## 2017-10-04 DIAGNOSIS — N10 Acute tubulo-interstitial nephritis: Secondary | ICD-10-CM

## 2017-10-04 DIAGNOSIS — Z7982 Long term (current) use of aspirin: Secondary | ICD-10-CM

## 2017-10-04 DIAGNOSIS — R778 Other specified abnormalities of plasma proteins: Secondary | ICD-10-CM

## 2017-10-04 DIAGNOSIS — R32 Unspecified urinary incontinence: Secondary | ICD-10-CM | POA: Diagnosis present

## 2017-10-04 DIAGNOSIS — R296 Repeated falls: Secondary | ICD-10-CM | POA: Diagnosis present

## 2017-10-04 DIAGNOSIS — I361 Nonrheumatic tricuspid (valve) insufficiency: Secondary | ICD-10-CM | POA: Diagnosis not present

## 2017-10-04 DIAGNOSIS — E119 Type 2 diabetes mellitus without complications: Secondary | ICD-10-CM

## 2017-10-04 DIAGNOSIS — I6330 Cerebral infarction due to thrombosis of unspecified cerebral artery: Secondary | ICD-10-CM | POA: Diagnosis not present

## 2017-10-04 DIAGNOSIS — F17210 Nicotine dependence, cigarettes, uncomplicated: Secondary | ICD-10-CM | POA: Diagnosis present

## 2017-10-04 DIAGNOSIS — R269 Unspecified abnormalities of gait and mobility: Secondary | ICD-10-CM | POA: Diagnosis not present

## 2017-10-04 DIAGNOSIS — R5381 Other malaise: Secondary | ICD-10-CM | POA: Diagnosis not present

## 2017-10-04 DIAGNOSIS — R509 Fever, unspecified: Secondary | ICD-10-CM | POA: Diagnosis not present

## 2017-10-04 DIAGNOSIS — I69393 Ataxia following cerebral infarction: Secondary | ICD-10-CM

## 2017-10-04 DIAGNOSIS — I6389 Other cerebral infarction: Secondary | ICD-10-CM | POA: Diagnosis not present

## 2017-10-04 DIAGNOSIS — W19XXXA Unspecified fall, initial encounter: Secondary | ICD-10-CM | POA: Diagnosis present

## 2017-10-04 DIAGNOSIS — E1159 Type 2 diabetes mellitus with other circulatory complications: Secondary | ICD-10-CM | POA: Diagnosis not present

## 2017-10-04 DIAGNOSIS — Z79899 Other long term (current) drug therapy: Secondary | ICD-10-CM

## 2017-10-04 DIAGNOSIS — I10 Essential (primary) hypertension: Secondary | ICD-10-CM | POA: Diagnosis not present

## 2017-10-04 DIAGNOSIS — F1910 Other psychoactive substance abuse, uncomplicated: Secondary | ICD-10-CM | POA: Diagnosis present

## 2017-10-04 HISTORY — DX: Altered mental status, unspecified: R41.82

## 2017-10-04 LAB — CBC WITH DIFFERENTIAL/PLATELET
BASOS ABS: 0 10*3/uL (ref 0.0–0.1)
BASOS PCT: 0 %
EOS ABS: 0 10*3/uL (ref 0.0–0.7)
EOS PCT: 0 %
HCT: 36.2 % — ABNORMAL LOW (ref 39.0–52.0)
Hemoglobin: 11.4 g/dL — ABNORMAL LOW (ref 13.0–17.0)
Lymphocytes Relative: 2 %
Lymphs Abs: 0.3 10*3/uL — ABNORMAL LOW (ref 0.7–4.0)
MCH: 27.4 pg (ref 26.0–34.0)
MCHC: 31.5 g/dL (ref 30.0–36.0)
MCV: 87 fL (ref 78.0–100.0)
Monocytes Absolute: 0.3 10*3/uL (ref 0.1–1.0)
Monocytes Relative: 2 %
Neutro Abs: 12 10*3/uL — ABNORMAL HIGH (ref 1.7–7.7)
Neutrophils Relative %: 96 %
PLATELETS: 194 10*3/uL (ref 150–400)
RBC: 4.16 MIL/uL — AB (ref 4.22–5.81)
RDW: 14.1 % (ref 11.5–15.5)
WBC: 12.5 10*3/uL — AB (ref 4.0–10.5)

## 2017-10-04 LAB — GLUCOSE, CAPILLARY
GLUCOSE-CAPILLARY: 209 mg/dL — AB (ref 65–99)
Glucose-Capillary: 160 mg/dL — ABNORMAL HIGH (ref 65–99)

## 2017-10-04 LAB — URINALYSIS, ROUTINE W REFLEX MICROSCOPIC
BILIRUBIN URINE: NEGATIVE
Glucose, UA: NEGATIVE mg/dL
HGB URINE DIPSTICK: NEGATIVE
Ketones, ur: NEGATIVE mg/dL
NITRITE: NEGATIVE
PH: 5 (ref 5.0–8.0)
Protein, ur: NEGATIVE mg/dL
SPECIFIC GRAVITY, URINE: 1.011 (ref 1.005–1.030)
Squamous Epithelial / LPF: NONE SEEN

## 2017-10-04 LAB — COMPREHENSIVE METABOLIC PANEL
ALBUMIN: 3.3 g/dL — AB (ref 3.5–5.0)
ALK PHOS: 48 U/L (ref 38–126)
ALT: 18 U/L (ref 17–63)
AST: 29 U/L (ref 15–41)
Anion gap: 13 (ref 5–15)
BUN: 16 mg/dL (ref 6–20)
CALCIUM: 9.2 mg/dL (ref 8.9–10.3)
CHLORIDE: 104 mmol/L (ref 101–111)
CO2: 23 mmol/L (ref 22–32)
CREATININE: 1.34 mg/dL — AB (ref 0.61–1.24)
GFR calc non Af Amer: 56 mL/min — ABNORMAL LOW (ref >60)
GLUCOSE: 169 mg/dL — AB (ref 65–99)
Potassium: 3.8 mmol/L (ref 3.5–5.1)
Sodium: 140 mmol/L (ref 135–145)
Total Bilirubin: 0.8 mg/dL (ref 0.3–1.2)
Total Protein: 6.3 g/dL — ABNORMAL LOW (ref 6.5–8.1)

## 2017-10-04 LAB — CREATININE, SERUM
CREATININE: 1.38 mg/dL — AB (ref 0.61–1.24)
GFR calc Af Amer: >60 mL/min (ref >60)
GFR, EST NON AFRICAN AMERICAN: 54 mL/min — AB (ref >60)

## 2017-10-04 LAB — I-STAT TROPONIN, ED: TROPONIN I, POC: 0 ng/mL (ref 0.00–0.08)

## 2017-10-04 LAB — HEMOGLOBIN A1C
Hgb A1c MFr Bld: 7.7 % — ABNORMAL HIGH (ref 4.8–5.6)
Mean Plasma Glucose: 174.29 mg/dL

## 2017-10-04 LAB — INFLUENZA PANEL BY PCR (TYPE A & B)
INFLBPCR: NEGATIVE
Influenza A By PCR: NEGATIVE

## 2017-10-04 LAB — APTT: aPTT: 33 seconds (ref 24–36)

## 2017-10-04 LAB — PROTIME-INR
INR: 1.22
Prothrombin Time: 15.3 seconds — ABNORMAL HIGH (ref 11.4–15.2)

## 2017-10-04 LAB — MAGNESIUM: Magnesium: 1.3 mg/dL — ABNORMAL LOW (ref 1.7–2.4)

## 2017-10-04 LAB — TROPONIN I

## 2017-10-04 LAB — CK: Total CK: 719 U/L — ABNORMAL HIGH (ref 49–397)

## 2017-10-04 LAB — TSH: TSH: 0.38 u[IU]/mL (ref 0.350–4.500)

## 2017-10-04 LAB — I-STAT CG4 LACTIC ACID, ED
Lactic Acid, Venous: 2.46 mmol/L (ref 0.5–1.9)
Lactic Acid, Venous: 1.52 mmol/L (ref 0.5–1.9)

## 2017-10-04 LAB — PHOSPHORUS: PHOSPHORUS: 2.4 mg/dL — AB (ref 2.5–4.6)

## 2017-10-04 LAB — CBG MONITORING, ED: GLUCOSE-CAPILLARY: 152 mg/dL — AB (ref 65–99)

## 2017-10-04 LAB — VITAMIN B12: Vitamin B-12: 169 pg/mL — ABNORMAL LOW (ref 180–914)

## 2017-10-04 LAB — ETHANOL

## 2017-10-04 MED ORDER — VANCOMYCIN HCL IN DEXTROSE 1-5 GM/200ML-% IV SOLN
1000.0000 mg | Freq: Once | INTRAVENOUS | Status: AC
  Administered 2017-10-04: 1000 mg via INTRAVENOUS
  Filled 2017-10-04: qty 200

## 2017-10-04 MED ORDER — ACETAMINOPHEN 500 MG PO TABS: 1000.0000 mg | ORAL_TABLET | Freq: Once | ORAL | Status: DC

## 2017-10-04 MED ORDER — POLYETHYLENE GLYCOL 3350 17 G PO PACK: 17.0000 g | PACK | Freq: Every day | ORAL | Status: DC | PRN

## 2017-10-04 MED ORDER — ACETAMINOPHEN 650 MG RE SUPP
650.0000 mg | Freq: Once | RECTAL | Status: AC
  Administered 2017-10-04: 650 mg via RECTAL
  Filled 2017-10-04: qty 1

## 2017-10-04 MED ORDER — PIPERACILLIN-TAZOBACTAM 3.375 G IVPB 30 MIN
3.3750 g | Freq: Once | INTRAVENOUS | Status: AC
  Administered 2017-10-04: 3.375 g via INTRAVENOUS
  Filled 2017-10-04: qty 50

## 2017-10-04 MED ORDER — CLOPIDOGREL BISULFATE 75 MG PO TABS
75.0000 mg | ORAL_TABLET | Freq: Every day | ORAL | Status: DC
  Administered 2017-10-05 – 2017-10-06 (×2): 75 mg via ORAL
  Filled 2017-10-04 (×2): qty 1

## 2017-10-04 MED ORDER — SODIUM CHLORIDE 0.45 % IV SOLN
INTRAVENOUS | Status: DC
  Administered 2017-10-04 – 2017-10-06 (×4): via INTRAVENOUS

## 2017-10-04 MED ORDER — SODIUM CHLORIDE 0.9 % IV BOLUS (SEPSIS)
500.0000 mL | Freq: Once | INTRAVENOUS | Status: AC
  Administered 2017-10-04: 500 mL via INTRAVENOUS

## 2017-10-04 MED ORDER — VANCOMYCIN HCL 500 MG IV SOLR
500.0000 mg | Freq: Two times a day (BID) | INTRAVENOUS | Status: DC
  Administered 2017-10-05: 500 mg via INTRAVENOUS
  Filled 2017-10-04 (×2): qty 500

## 2017-10-04 MED ORDER — DONEPEZIL HCL 10 MG PO TABS
10.0000 mg | ORAL_TABLET | Freq: Every day | ORAL | Status: DC
  Administered 2017-10-04: 10 mg via ORAL
  Filled 2017-10-04: qty 1

## 2017-10-04 MED ORDER — INSULIN ASPART 100 UNIT/ML ~~LOC~~ SOLN
0.0000 [IU] | Freq: Three times a day (TID) | SUBCUTANEOUS | Status: DC
  Administered 2017-10-05 (×2): 2 [IU] via SUBCUTANEOUS
  Administered 2017-10-05 – 2017-10-07 (×3): 3 [IU] via SUBCUTANEOUS
  Administered 2017-10-07: 2 [IU] via SUBCUTANEOUS
  Administered 2017-10-07 – 2017-10-08 (×4): 5 [IU] via SUBCUTANEOUS
  Administered 2017-10-09: 8 [IU] via SUBCUTANEOUS
  Administered 2017-10-09: 2 [IU] via SUBCUTANEOUS
  Administered 2017-10-09: 3 [IU] via SUBCUTANEOUS
  Administered 2017-10-10: 5 [IU] via SUBCUTANEOUS
  Administered 2017-10-10 (×2): 2 [IU] via SUBCUTANEOUS

## 2017-10-04 MED ORDER — ONDANSETRON HCL 4 MG PO TABS: 4.0000 mg | ORAL_TABLET | Freq: Four times a day (QID) | ORAL | Status: DC | PRN

## 2017-10-04 MED ORDER — PIPERACILLIN-TAZOBACTAM 3.375 G IVPB
3.3750 g | Freq: Three times a day (TID) | INTRAVENOUS | Status: DC
  Administered 2017-10-04 – 2017-10-05 (×2): 3.375 g via INTRAVENOUS
  Filled 2017-10-04 (×3): qty 50

## 2017-10-04 MED ORDER — ACETAMINOPHEN 325 MG PO TABS
650.0000 mg | ORAL_TABLET | Freq: Four times a day (QID) | ORAL | Status: DC | PRN
  Administered 2017-10-04 – 2017-10-07 (×4): 650 mg via ORAL
  Filled 2017-10-04 (×4): qty 2

## 2017-10-04 MED ORDER — ACETAMINOPHEN 650 MG RE SUPP: 650.0000 mg | Freq: Four times a day (QID) | RECTAL | Status: DC | PRN

## 2017-10-04 MED ORDER — SODIUM CHLORIDE 0.9 % IV BOLUS (SEPSIS): 500.0000 mL | Freq: Once | INTRAVENOUS | Status: DC

## 2017-10-04 MED ORDER — VITAMIN D 1000 UNITS PO TABS
1000.0000 [IU] | ORAL_TABLET | Freq: Every day | ORAL | Status: DC
  Administered 2017-10-05 – 2017-10-10 (×6): 1000 [IU] via ORAL
  Filled 2017-10-04 (×6): qty 1

## 2017-10-04 MED ORDER — ONDANSETRON HCL 4 MG/2ML IJ SOLN: 4.0000 mg | Freq: Four times a day (QID) | INTRAMUSCULAR | Status: DC | PRN

## 2017-10-04 MED ORDER — HEPARIN SODIUM (PORCINE) 5000 UNIT/ML IJ SOLN
5000.0000 [IU] | Freq: Three times a day (TID) | INTRAMUSCULAR | Status: DC
  Administered 2017-10-04 – 2017-10-10 (×18): 5000 [IU] via SUBCUTANEOUS
  Filled 2017-10-04 (×17): qty 1

## 2017-10-04 NOTE — Progress Notes (Signed)
Pharmacy Antibiotic Note  [name] Williams is a 62 y.o. male admitted on 10/04/2017 with sepsis.  Pharmacy has been consulted for vancomycin and zosyn dosing. Renal function pending.  Vancomycin trough goal 15-20  Plan: 1) Vancomycin 1g IV x 1 2) Zosyn 3.375g IV x 1 3) Follow up sCr for further maintenance doses  Height: 5\' 9"  (175.3 cm) Weight: 150 lb (68 kg) IBW/kg (Calculated) : 70.7  Temp (24hrs), Avg:100 F (37.8 C), Min:99.8 F (37.7 C), Max:100.1 F (37.8 C)  Recent Labs  Lab 10/04/17 1314 10/04/17 1414  WBC 12.5*  --   LATICACIDVEN  --  2.46*    CrCl cannot be calculated (Patient's most recent lab result is older than the maximum 21 days allowed.).    No Known Allergies  Antimicrobials this admission: 2/19 Vancomycin >> 2/19 Zosyn >>  Dose adjustments this admission: n/a  Microbiology results: 2/19 urine cx >> 2/19 blood cx >>  Thank you for allowing pharmacy to be a part of this patient's care.  Fredrik RiggerMarkle, Sujay Grundman Sue 10/04/2017 3:16 PM

## 2017-10-04 NOTE — ED Notes (Signed)
[facility] contacted to activate Code Sepsis 

## 2017-10-04 NOTE — ED Notes (Signed)
Patient transported to CT 

## 2017-10-04 NOTE — Progress Notes (Signed)
Pharmacy Antibiotic Note  [name] NeerJames Dykema is a 62 y.o. male admitted on 10/04/2017 with sepsis.  Pharmacy has been consulted for vancomycin and zosyn dosing.   The patient's CMP has resulted and SCr is 1.34, estimated CrCl~50-60 ml/min.   Vancomycin 1g IV x 1 and Zosyn 3.375g x 1 have already been given in the ED around 1500  Plan: 1. Start Vancomycin 500 mg IV every 12 hours (next dose due at 0300 on 2/20) 2. Start Zosyn 3.375g IV every 8 hours (infused over 4 hours - next dose due at 2200 this evening) 3. Will continue to follow renal function, culture results, LOT, and antibiotic de-escalation plans    Height: 5\' 9"  (175.3 cm) Weight: 150 lb (68 kg) IBW/kg (Calculated) : 70.7  Temp (24hrs), Avg:100 F (37.8 C), Min:99.8 F (37.7 C), Max:100.1 F (37.8 C)  Recent Labs  Lab 10/04/17 1314 10/04/17 1414 10/04/17 1518 10/04/17 1559  WBC 12.5*  --   --   --   CREATININE  --   --  1.34*  --   LATICACIDVEN  --  2.46*  --  1.52    Estimated Creatinine Clearance: 55.7 mL/min (A) (by C-G formula based on SCr of 1.34 mg/dL (H)).    No Known Allergies  Antimicrobials this admission: 2/19 Vancomycin >> 2/19 Zosyn >>  Dose adjustments this admission: n/a  Microbiology results: 2/19 urine cx >> 2/19 blood cx >>  Thank you for allowing pharmacy to be a part of this patient's care.  Georgina PillionElizabeth Eurydice Calixto, PharmD, BCPS Clinical Pharmacist Pager: [phone] Clinical phone for 10/04/2017: [phone] 10/04/2017 4:27 PM

## 2017-10-04 NOTE — ED Triage Notes (Addendum)
Patient from home with [facility] after getting while dizzy while walking in a hallway and falling. Alert and oriented, denies hitting his head, denies any pain, denies LOC. 18g saline lock in left AC. Alert and in no apparent distress at this time. CBG 154. Right arm weaker than left, patient states that is normal for him.  Patient states he does not feel dizzy now.

## 2017-10-04 NOTE — H&P (Signed)
Family Medicine Teaching [facility] Admission History and Physical Service Pager: [phone]  Patient name: [name] Williams Medical record number: [id number] Date of birth: 04-12-1956 Age: 62 y.o. Gender: male  Primary Care Provider: Annie Sable, NP Consultants: none Code Status: full  Chief Complaint:   Assessment and Plan: [name] Williams is a 62 y.o. male presenting with altered mental status and syncope. PMH is significant for HTN, T2DM, CVA in 2011, HLD, and schizophrenia.   Altered Mental Status  Syncope  Concern for Sepsis : Patient presents with unwitnessed fall (per sister, has been falling more frequently x 3wks) and altered mental status earlier in the day. ROS positive for diarrhea x 5 d and cough/chills today, otherwise negative. On admission, tachycardic but normotensive and afebrile with intermittent tachypnea. On exam, patient oriented x2 but difficult to arouse (sister notes this is significantly different than his baseline) w pinpoint pupils and diminished strength bilaterally (although poor effort) but otherwise wnl. Admission labs remarkable for WBC 12.5, Cr 1.34, and lactate 2.46 (resolved post-IVF) but unremarkable EKG, neg trop, and neg flu. CT head on admission without acute abnormality. SOFA 2 (creatinine and GCS) with elevated lactate concerning for sepsis. Ddx broad but includes viral URI (+nasal crusting on exam, cough, chills although this does not explain pinpoint pupils or sedation), intoxication (pinpoint pupils on exam, but pt denies drug use), meningitis (although some neck soreness on exam, able to flex and has pain with lateral rotation and denies photophobia, headache, and vision change), CVA (although no focal deficits on exam, concerning in a patient who has a history of CVA and was unable to walk following syncope), ACS (although EKG and trop unremarkable), or GI pathogen (could be orthostasis 2/2 dehydration but would not explain pinpoint pupils and  abd exam benign). Unlikely urinary source (UA unremarkable), although will follow-up urine culture. Plan to treat as sepsis with unknown source pending further workup. - Admit to telemetry under inpatient status, attending Dr. Deirdre Priest  - f/u UDS and ethanol to r/o toxic cause - f/u HIV, RPR, B12, folate to r/o other causes of AMS - f/u procalcitonin to guide whether or not this may be a bacterial infection - Hgb A1C, TSH, Mg, Phos, CK (pt found collapsed on floor) also ordered - f/u Bcx x2, Ucx, RVP for infectious source workup - trend trops and repeat AM EKG to rule out cardiac cause - will consider MRI if above workup negative and if patient remains sleepy in the morning without a known cause, although no focal deficits on exam - continue vanc/zosyn (2/19- ); started in ED - mIVF 1/2NS at 100cc/hr; s/p 1L total in ED - hold home HTN meds in setting of low-normal BP and concern for sepsis - AM CBC and BMP - ondansetron 4mg  Q6H prn nausea - acetaminophen 650mg  Q6H prn pain - hold home donepezil as it can cause sedation - PT/OT consult  HTN: Bps 101-150s systolic since admission. 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 Followed by [facility]; receives paliperidone once monthly (last on 2/11). Sister reports he frequently talks to himself and this is unchanged.  - CTM  HLD: reported by patient - f/u lipid panel  FEN/GI: Carb consistent diet Prophylaxis: heparin for PPX, continue home  clopidigrel  Disposition: admit to inpatient status  History of Present Illness:  [name] Williams is a 62 y.o. male presenting to the ED following a fall at home. History provided by both patient and sister. Sister reports that at 9am this morning he got up to use his walker and fell (unwitnessed by sister who he lives at home with). He denies hitting his head but does not recall the events surrounding the fall other than generally feeling "dizzy". His sister was unable to get him off the ground and called EMS.  His sister notes that he has had increasing falls x 3 weeks, all unwitnessed, but that she finds him down around the house. He walks with a rollator and is able to use bedside commode and feed himself but needs help bathing.   ROS remarkable for diarrhea for the last 4-5 days, generally loose brown stools (although one was black) that the patient has while in bed. Also notable for a non-productive cough for several days.  He is alert to place and person and did not know the correct year but knew his birthday. Very somnolent. Per sister he seems to be in some pain when you try to lift him of help him up. Otherwise, denies urinary changes, fevers/chills at home (although had some chills during transport with EMS), chest pain/SOB, or muscle aches.   Of note, he smokes 4-6 cigarettes/day and denies alcohol and illicit drug use.   Review Of Systems: Per HPI with the following additions:   Review of Systems  Constitutional: Positive for chills. Negative for fever.  HENT: Positive for congestion.   Eyes: Negative for blurred vision, double vision and photophobia.  Respiratory: Positive for cough. Negative for hemoptysis, sputum production and shortness of breath.   Cardiovascular: Negative for chest pain, palpitations, orthopnea and leg swelling.  Gastrointestinal: Positive for diarrhea. Negative for abdominal pain, nausea and vomiting.  Genitourinary: Negative for dysuria, frequency, hematuria  and urgency.  Musculoskeletal: Positive for neck pain. Negative for joint pain and myalgias.  Neurological: Positive for dizziness. Negative for headaches.    Patient Active Problem List   Diagnosis Date Noted  . Altered mental status, unspecified 10/04/2017  . Fever   . Leukocytosis   . Schizophrenia (HCC) 10/31/2014  . 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 CVA (cerebral infarction) 06/26/2014  . 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 Sensory deficit, left 06/26/2014  . 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 Diplopia   . Acute CVA (cerebrovascular accident) (HCC) 06/23/2014  . Stroke-like symptom 06/23/2014  . Preventative health care 08/29/2013  . History of CVA (cerebrovascular accident) 12/01/2009  . HLD (hyperlipidemia) 04/08/2008  . DYSLIPIDEMIA 04/08/2008  . TOBACCO ABUSE 04/08/2008    Past Medical History: Past Medical History:  Diagnosis Date  . Diabetes mellitus   . History of stroke   . Hypertension     Past Surgical History: No past surgical history on file.  Social History: Social History   Tobacco Use  . Smoking status: Current Every Day Smoker    Packs/day: 1.00    Years: 10.00    Pack years: 10.00    Types: Cigarettes  Substance Use Topics  . Alcohol use: No  . Drug use: Yes  Comment: marijuana occassionally    Additional social history: denies illicit drug use; lives with sister who he relies on for most of his IADLS and some ADLs.  Please also refer to relevant sections of EMR.  Family History: Family History  Problem Relation Age of Onset  . Hypertension Mother   . Hypertension Father   . Coronary artery disease Maternal Aunt   . Diabetes Maternal Aunt   . Coronary artery disease Maternal Uncle   . Diabetes Maternal Uncle   . Diabetes Paternal Aunt   . Coronary artery disease Paternal Aunt   . Diabetes Paternal Uncle   . Coronary artery disease Paternal Uncle     Allergies and Medications: No Known Allergies No current  facility-administered medications on file prior to encounter.    Current Outpatient Medications on File Prior to Encounter  Medication Sig Dispense Refill  . amLODipine (NORVASC) 5 MG tablet Take 5 mg by mouth daily.    . cholecalciferol (VITAMIN D) 1000 units tablet Take 1,000 Units by mouth daily.    . clopidogrel (PLAVIX) 75 MG tablet Take 1 tablet (75 mg total) by mouth daily. 30 tablet 3  . donepezil (ARICEPT) 10 MG tablet Take 10 mg by mouth at bedtime.    Marland Kitchen lisinopril-hydrochlorothiazide (PRINZIDE,ZESTORETIC) 20-12.5 MG tablet Take 1 tablet by mouth daily.    . metFORMIN (GLUCOPHAGE) 1000 MG tablet TAKE 0.5 TABLETS (500 MG TOTAL) BY MOUTH 2 (TWO) TIMES DAILY WITH A MEAL. (Patient taking differently: Take 500 mg by mouth 2 (two) times daily with a meal. ) 60 tablet 3  . paliperidone (INVEGA SUSTENNA) 156 MG/ML SUSP injection Inject 156 mg into the muscle every 28 (twenty-eight) days.    Marland Kitchen aspirin 81 MG EC tablet TAKE 1 TABLET (81 MG TOTAL) BY MOUTH DAILY. (Patient not taking: Reported on 10/04/2017) 30 tablet 11  . aspirin EC 81 MG tablet Take 1 tablet (81 mg total) by mouth daily. (Patient not taking: Reported on 10/04/2017) 30 tablet 2  . atorvastatin (LIPITOR) 40 MG tablet Take 1 tablet (40 mg total) by mouth daily at 6 PM. (Patient not taking: Reported on 10/04/2017) 30 tablet 3  . lisinopril (PRINIVIL,ZESTRIL) 10 MG tablet Take 1 tablet (10 mg total) by mouth daily. (Patient not taking: Reported on 10/04/2017) 30 tablet 3    Objective: BP 113/61   Pulse 94   Temp 100.1 F (37.8 C) (Rectal)   Resp 20   Ht 5\' 9"  (1.753 m)   Wt 150 lb (68 kg)   SpO2 98%   BMI 22.15 kg/m  Exam: General: somnolent, difficult to arouse, oriented to person and place but stated date as "July 1919", able to state birthday. Diaphoretic, warm to touch.  ENTM: pinpoint pupils, no appreciable conjunctival injection or icterus. Poor dentition. Patient unable to open mouth for full exam. Exophthalmos  appreciated on exam.   Neck: Neck supple, no palpable thyroid nodules or masses, trachea midline. Sub-centimeter submandibular lymphadenopathy. Cardiovascular: Tachycardic, regular rate. No appreciable murmurs, rubs, or gallops. <3sec cap refill.  Respiratory: Normal WOB on RA. Unable to complete lung exam due to patient's somnolence.  Gastrointestinal: Normoactive bowel sounds. Soft, nontender, nondistended. No appreciable hepatomegaly or splenomegaly.  MSK: 4/5 strength in bilateral UEs, grip strength symmetrical and intact bilaterally, 3/5 strength in bilateral LEs. Sensation to light touch intact in upper and lower extremities. WWP, no LE edema appreciated. Please note exam limited by patient participation, as significantly difficult for him to be aroused to follow commands.  Derm: multiple, small pustules on anterior aspect of chin, otherwise no appreciable rashes or lesions.  Neuro: strength and sensation as above. No special tests performed.  Psych: Limited exam, responds in 1-2 word sentences, oriented as above. Sister reports patient responds to internal stimuli at home but not appreciated on exam.   Labs and Imaging: CBC BMET  Recent Labs  Lab 10/04/17 1314  WBC 12.5*  HGB 11.4*  HCT 36.2*  PLT 194   Recent Labs  Lab 10/04/17 1518  NA 140  K 3.8  CL 104  CO2 23  BUN 16  CREATININE 1.34*  GLUCOSE 169*  CALCIUM 9.2      Desiree HaneMichaels, Arianna, Medical Student 10/04/2017, 4:12 PM MS4, [facility] FPTS Intern pager: [phone], text pages welcome  FPTS Upper-Level Resident Addendum  I have independently interviewed and examined the patient. I have discussed the above with the original author and agree with their documentation. My edits for correction/addition/clarification are in blue. Please see also any attending notes.   Willadean CarolKaty Faizaan Falls, MD PGY-3, [facility] FPTS Service pager: [phone] (text pages welcome through [employer])

## 2017-10-04 NOTE — Progress Notes (Signed)
Arrived at 3w at [id number]. Pt stable. Will continue to monitor.

## 2017-10-04 NOTE — ED Provider Notes (Signed)
[name]   CSN: [id number] Arrival date & time: 10/04/17  1027     History   Chief Complaint Chief Complaint  Patient presents with  . Fall    HPI [name] Williams is a 62 y.o. male.  HPI 62 year old African American male past medical history significant for schizophrenia, diabetes, hypertension, history of CVA with right-sided deficits presents to the emergency department today with family at bedside for evaluation of dizziness.  Sister at bedside states that patient was using his walker in the kitchen today and stated that he became very dizzy.  Patient lowered himself to the floor.  Sister at bedside states that patient did not pass out.  Patient states that he has been feeling dizzy for the past few days.  She also states that he was slightly more altered this morning than normal.  Patient does have history of CVA with right-sided deficits and weakness.  Does have some speech difficulty at baseline.  Sister states that he did go to bed normal last night.  When he woke up this morning around 9:00 she noticed that something was a little bit different with him.  Also states that patient has been incontinent of urine and stool for the past few days.  Patient denies any current dizziness.  Patient denies any head injury.  He denies any pain at this time.  Patient does report a productive cough over the past few days it has gradually worsened.  No known fevers.  No known sick contacts.  Denies any associated diarrhea, urinary symptoms.  Denies any abdominal pain.  Sister states that when he went to bed around 10:00 last night he was normal.  Pt denies any fever, chill, ha, vision changes, lightheadedness, dizziness, congestion, neck pain, cp, sob, cough, abd pain, n/v/d, urinary symptoms, change in bowel habits, melena, hematochezia, lower extremity paresthesias.  Past Medical History:  Diagnosis Date  . Diabetes mellitus   . History of  stroke   . Hypertension     Patient Active Problem List   Diagnosis Date Noted  . Schizophrenia (HCC) 10/31/2014  . 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 CVA (cerebral infarction) 06/26/2014  . 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 Sensory deficit, left 06/26/2014  . 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 Diplopia   . Acute CVA (cerebrovascular accident) (HCC) 06/23/2014  . Stroke-like symptom 06/23/2014  . Preventative health care 08/29/2013  . History of CVA (cerebrovascular accident) 12/01/2009  . HLD (hyperlipidemia) 04/08/2008  . DYSLIPIDEMIA 04/08/2008  . TOBACCO ABUSE 04/08/2008    No past surgical history on file.     Home Medications    Prior to Admission medications   Medication Sig Start Date End Date Taking? Authorizing Provider  amLODipine (NORVASC) 5 MG tablet Take 5 mg by mouth daily.   Yes [provider]  cholecalciferol (VITAMIN D) 1000 units tablet Take 1,000 Units by mouth daily.   Yes [provider]  clopidogrel (PLAVIX) 75 MG tablet Take 1 tablet (75 mg total) by mouth daily. 07/05/14  Yes Angiulli, Mcarthur Rossettianiel J, PA-C  donepezil (ARICEPT) 10 MG tablet Take 10 mg by mouth at bedtime.   Yes [provider]  lisinopril-hydrochlorothiazide (PRINZIDE,ZESTORETIC) 20-12.5 MG tablet Take 1 tablet by mouth daily.   Yes [provider]  metFORMIN (GLUCOPHAGE) 1000 MG tablet TAKE 0.5 TABLETS (500 MG TOTAL) BY MOUTH 2 (TWO) TIMES DAILY WITH A MEAL. Patient taking differently: Take 500 mg by mouth 2 (two) times daily with  a meal.  07/05/14  Yes Angiulli, Mcarthur Rossetti, PA-C  paliperidone (INVEGA SUSTENNA) 156 MG/ML SUSP injection Inject 156 mg into the muscle every 28 (twenty-eight) days.   Yes [provider]  aspirin 81 MG EC tablet TAKE 1 TABLET (81 MG TOTAL) BY MOUTH DAILY. Patient not taking: Reported on 10/04/2017 10/16/14   Denton Brick, MD  aspirin EC 81 MG tablet Take 1 tablet (81 mg total) by mouth daily. Patient not  taking: Reported on 10/04/2017 07/05/14   Angiulli, Mcarthur Rossetti, PA-C  atorvastatin (LIPITOR) 40 MG tablet Take 1 tablet (40 mg total) by mouth daily at 6 PM. Patient not taking: Reported on 10/04/2017 07/05/14   Angiulli, Mcarthur Rossetti, PA-C  lisinopril (PRINIVIL,ZESTRIL) 10 MG tablet Take 1 tablet (10 mg total) by mouth daily. Patient not taking: Reported on 10/04/2017 07/05/14   Angiulli, Mcarthur Rossetti, PA-C    Family History Family History  Problem Relation Age of Onset  . Hypertension Mother   . Hypertension Father   . Coronary artery disease Maternal Aunt   . Diabetes Maternal Aunt   . Coronary artery disease Maternal Uncle   . Diabetes Maternal Uncle   . Diabetes Paternal Aunt   . Coronary artery disease Paternal Aunt   . Diabetes Paternal Uncle   . Coronary artery disease Paternal Uncle     Social History Social History   Tobacco Use  . Smoking status: Current Every Day Smoker    Packs/day: 1.00    Years: 10.00    Pack years: 10.00    Types: Cigarettes  Substance Use Topics  . Alcohol use: No  . Drug use: Yes    Comment: marijuana occassionally      Allergies   Patient has no known allergies.   Review of Systems Review of Systems  All other systems reviewed and are negative.    Physical Exam Updated Vital Signs BP 113/61   Pulse 94   Temp 100.1 F (37.8 C) (Rectal)   Resp 20   Ht 5\' 9"  (1.753 m)   Wt 68 kg (150 lb)   SpO2 98%   BMI 22.15 kg/m   Physical Exam  Constitutional: He is oriented to person, place, and time. He appears well-nourished. He appears lethargic.  Non-toxic appearance. No distress.  HENT:  Head: Normocephalic and atraumatic.  Nose: Nose normal.  Mouth/Throat: Oropharynx is clear and moist.  Eyes: Conjunctivae are normal. Pupils are equal, round, and reactive to light. Right eye exhibits no discharge. Left eye exhibits no discharge.  Neck: Normal range of motion. Neck supple. No JVD present. No tracheal deviation present.  No c spine  midline tenderness. No paraspinal tenderness. No deformities or step offs noted. Full ROM. Supple. No nuchal rigidity.    Cardiovascular: Regular rhythm, normal heart sounds and intact distal pulses. Exam reveals no gallop and no friction rub.  No murmur heard. Tachycardia noted.  Pulmonary/Chest: Effort normal and breath sounds normal. No stridor. No respiratory distress. He has no wheezes. He exhibits no tenderness.  Rhonchorous sounds throughout no wheezing noted.  Abdominal: Soft. Bowel sounds are normal. He exhibits no distension. There is no tenderness. There is no rebound and no guarding.  Musculoskeletal: Normal range of motion. He exhibits no tenderness.  No midline T spine or L spine tenderness. No deformities or step offs noted. Full ROM. Pelvis is stable.  Lymphadenopathy:    He has no cervical adenopathy.  Neurological: He is oriented to person, place, and time. He appears lethargic.  Grip strength 4 out of 5 in right upper extremity and 5 out of 5 in left upper extremity.  Strength 4 out of 5 in right lower extremity and 5 out of 5 in left lower extremity.  Patient follows commands appropriately.  Oriented to person place and time.  Skin: Skin is warm and dry. Capillary refill takes less than 2 seconds. No rash noted.  Patient warm to touch.  Psychiatric: His behavior is normal. Judgment and thought content normal.  Nursing note and vitals reviewed.    ED Treatments / Results  Labs (all labs ordered are listed, but only abnormal results are displayed) Labs Reviewed  CBC WITH DIFFERENTIAL/PLATELET - Abnormal; Notable for the following components:      Result Value   WBC 12.5 (*)    RBC 4.16 (*)    Hemoglobin 11.4 (*)    HCT 36.2 (*)    Neutro Abs 12.0 (*)    Lymphs Abs 0.3 (*)    All other components within normal limits  URINALYSIS, ROUTINE W REFLEX MICROSCOPIC - Abnormal; Notable for the following components:   Leukocytes, UA SMALL (*)    Bacteria, UA RARE (*)     All other components within normal limits  CBG MONITORING, ED - Abnormal; Notable for the following components:   Glucose-Capillary 152 (*)    All other components within normal limits  I-STAT CG4 LACTIC ACID, ED - Abnormal; Notable for the following components:   Lactic Acid, Venous 2.46 (*)    All other components within normal limits  CULTURE, BLOOD (ROUTINE X 2)  CULTURE, BLOOD (ROUTINE X 2)  URINE CULTURE  COMPREHENSIVE METABOLIC PANEL  INFLUENZA PANEL BY PCR (TYPE A & B)  I-STAT TROPONIN, ED    EKG  EKG Interpretation  Date/Time:  Tuesday October 04 2017 10:50:56 EST Ventricular Rate:  87 PR Interval:    QRS Duration: 88 QT Interval:  357 QTC Calculation: 430 R Axis:   45 Text Interpretation:  Sinus rhythm no evidence of acute ischemia.  Confirmed by Bary Castilla ([id number]) on 10/04/2017 2:11:49 PM       Radiology Dg Chest 2 View  Result Date: 10/04/2017 CLINICAL DATA:  62 year old male with dizziness, slight dyspnea and chest pain. Recent fall. EXAM: CHEST  2 VIEW COMPARISON:  05/06/2016 FINDINGS: The heart size and mediastinal contours are within normal limits. Lung volumes are slightly low relative to previous exam. This may explain the crowding of interstitial lung markings probable atelectasis at the left lung base. The visualized skeletal structures are unremarkable. IMPRESSION: No active cardiopulmonary disease. Slightly low lung volumes with left basilar atelectasis. Electronically Signed   By: Tollie Eth M.D.   On: 10/04/2017 14:11   Ct Head Wo Contrast  Result Date: 10/04/2017 CLINICAL DATA:  Dizziness and fall. EXAM: CT HEAD WITHOUT CONTRAST CT CERVICAL SPINE WITHOUT CONTRAST TECHNIQUE: Multidetector CT imaging of the head and cervical spine was performed following the standard protocol without intravenous contrast. Multiplanar CT image reconstructions of the cervical spine were also generated. COMPARISON:  CT and MRI head dated June 23, 2014. FINDINGS: CT  HEAD FINDINGS Brain: No evidence of acute infarction, hemorrhage, hydrocephalus, extra-axial collection or mass lesion/mass effect. Unchanged right cerebellar infarct. Unchanged lacunar infarcts in the bilateral basal ganglia and thalami. Stable mild cerebral atrophy and chronic microvascular ischemic changes. The brainstem is mildly atrophic. Vascular: Atherosclerotic vascular calcification of the carotid siphons. No hyperdense vessel. Skull: Normal. Negative for fracture or focal lesion. Sinuses/Orbits: No acute finding. Small  mucous retention cysts in the bilateral maxillary sinuses. Other: None. CT CERVICAL SPINE FINDINGS Alignment: Trace retrolisthesis at C4-C5. No traumatic malalignment. Skull base and vertebrae: No acute fracture. No primary bone lesion or focal pathologic process. Auto fusion of the C2-C3 vertebral bodies and facets. Soft tissues and spinal canal: No prevertebral fluid or swelling. No visible canal hematoma. Disc levels: Mild degenerative disc disease and moderate left uncovertebral hypertrophy at C4-C5, moderately narrowing the left neural foramen. Upper chest: Mild centrilobular emphysema in the lung apices. Otherwise negative. Other: None. IMPRESSION: 1. No acute intracranial abnormality. Several chronic infarcts, as described above. 2.  No acute cervical spine fracture. Electronically Signed   By: Obie Dredge M.D.   On: 10/04/2017 13:56   Ct Cervical Spine Wo Contrast  Result Date: 10/04/2017 CLINICAL DATA:  Dizziness and fall. EXAM: CT HEAD WITHOUT CONTRAST CT CERVICAL SPINE WITHOUT CONTRAST TECHNIQUE: Multidetector CT imaging of the head and cervical spine was performed following the standard protocol without intravenous contrast. Multiplanar CT image reconstructions of the cervical spine were also generated. COMPARISON:  CT and MRI head dated June 23, 2014. FINDINGS: CT HEAD FINDINGS Brain: No evidence of acute infarction, hemorrhage, hydrocephalus, extra-axial collection  or mass lesion/mass effect. Unchanged right cerebellar infarct. Unchanged lacunar infarcts in the bilateral basal ganglia and thalami. Stable mild cerebral atrophy and chronic microvascular ischemic changes. The brainstem is mildly atrophic. Vascular: Atherosclerotic vascular calcification of the carotid siphons. No hyperdense vessel. Skull: Normal. Negative for fracture or focal lesion. Sinuses/Orbits: No acute finding. Small mucous retention cysts in the bilateral maxillary sinuses. Other: None. CT CERVICAL SPINE FINDINGS Alignment: Trace retrolisthesis at C4-C5. No traumatic malalignment. Skull base and vertebrae: No acute fracture. No primary bone lesion or focal pathologic process. Auto fusion of the C2-C3 vertebral bodies and facets. Soft tissues and spinal canal: No prevertebral fluid or swelling. No visible canal hematoma. Disc levels: Mild degenerative disc disease and moderate left uncovertebral hypertrophy at C4-C5, moderately narrowing the left neural foramen. Upper chest: Mild centrilobular emphysema in the lung apices. Otherwise negative. Other: None. IMPRESSION: 1. No acute intracranial abnormality. Several chronic infarcts, as described above. 2.  No acute cervical spine fracture. Electronically Signed   By: Obie Dredge M.D.   On: 10/04/2017 13:56    Procedures .Critical Care Performed by: Rise Mu, PA-C Authorized by: Rise Mu, PA-C   Critical care provider statement:    Critical care time (minutes):  55   Critical care was necessary to treat or prevent imminent or life-threatening deterioration of the following conditions:  Sepsis   Critical care was time spent personally by me on the following activities:  Development of treatment plan with patient or surrogate, discussions with consultants, discussions with primary provider, evaluation of patient's response to treatment, examination of patient, obtaining history from patient or surrogate, ordering and  performing treatments and interventions, ordering and review of laboratory studies, ordering and review of radiographic studies, pulse oximetry, re-evaluation of patient's condition and review of old charts   (including critical care time)  Medications Ordered in ED Medications  sodium chloride 0.9 % bolus 500 mL (not administered)  acetaminophen (TYLENOL) suppository 650 mg (not administered)  vancomycin (VANCOCIN) IVPB 1000 mg/200 mL premix (not administered)  piperacillin-tazobactam (ZOSYN) IVPB 3.375 g (not administered)     Initial Impression / Assessment and Plan / ED Course  I have reviewed the triage vital signs and the nursing notes.  Pertinent labs & imaging results that were available during my  care of the patient were reviewed by me and considered in my medical decision making (see chart for details).     Patient presents the ED with complaints of 2 3 days history of dizziness.  Patient denies any dizziness at the time.  He did have a near syncope and collapse episode this morning.  Patient also reports productive cough.  Does report prior CVA with right-sided weakness deficits at baseline.  Family reports patient is slightly altered from baseline.  On my examination patient was warm to touch.  Rectal temperature was 100.1.  Patient tachycardic at 110-115.  Patient was normotensive.  No hypoxia and mild tachypnea noted.  Lungs with rhonchorous sounds throughout.  No wheezing noted.  No focal abdominal tenderness to palpation.  No lower extremity edema or calf tenderness.  Patient does have weakness on the right side which is baseline for patient.  Alert and oriented x3.  No meningeal signs.  No signs of intracranial, intrathoracic, intra-abdominal trauma.  Workup reveals a leukocytosis of 12,000.  Lactic acid elevated at 2.46.  UA shows no signs of infection.  Hemoglobin stable at baseline.  CMP is pending at this time.  Influenza pending at this time.  Chest x-ray shows no  signs of pneumonia.  CT scan of head and neck showed no acute abnormality's including trauma.  EKG shows normal sinus rhythm.  Given patient's elevated lactic acid with a leukocytosis, low-grade temperature and tachycardia will initiate sepsis protocol.  Unknown source at this time.  Patient was treated with broad-spectrum antibiotics including vancomycin and Zosyn.  Blood cultures were obtained prior to antibiotic initiation.  Lactic acid was not 4 and patient was normotensive.  The 30 cc/kg fluid bolus was not initiated.  Suspect patient symptoms likely due to infection unknown source.  However have high suspicion for influenza.  Patient has no meningeal signs.  Low suspicion for meningitis at this time.  Patient does not appear altered to me.  Patient follows commands appropriately.  Doubt acute cva. Pt last known normal was last night when he went to bed.  Patient will be admitted to hospital service for further workup.  Talk to for medicine teaching service who agrees to admission will see patient in the ED and place admission orders.  Patient remains hemodynamically at this time.  Fever treated with Tylenol.  Patient given small fluid bolus.  Patient seen and evaluated my attending who is agreeable the above plan.    Final Clinical Impressions(s) / ED Diagnoses   Final diagnoses:  Sepsis, due to unspecified organism [facility])  Dizziness    ED Discharge Orders    None       Wallace Keller 10/04/17 1539    Rise Mu, PA-C 10/04/17 1540    Mackuen, Cindee Salt, MD 10/08/17 1515

## 2017-10-05 ENCOUNTER — Encounter (HOSPITAL_COMMUNITY): Payer: Self-pay

## 2017-10-05 DIAGNOSIS — I10 Essential (primary) hypertension: Secondary | ICD-10-CM

## 2017-10-05 DIAGNOSIS — R748 Abnormal levels of other serum enzymes: Secondary | ICD-10-CM

## 2017-10-05 DIAGNOSIS — A419 Sepsis, unspecified organism: Secondary | ICD-10-CM

## 2017-10-05 LAB — BASIC METABOLIC PANEL
ANION GAP: 13 (ref 5–15)
BUN: 15 mg/dL (ref 6–20)
CHLORIDE: 104 mmol/L (ref 101–111)
CO2: 22 mmol/L (ref 22–32)
Calcium: 8.7 mg/dL — ABNORMAL LOW (ref 8.9–10.3)
Creatinine, Ser: 1.41 mg/dL — ABNORMAL HIGH (ref 0.61–1.24)
GFR calc Af Amer: >60 mL/min (ref >60)
GFR calc non Af Amer: 52 mL/min — ABNORMAL LOW (ref >60)
Glucose, Bld: 134 mg/dL — ABNORMAL HIGH (ref 65–99)
POTASSIUM: 4 mmol/L (ref 3.5–5.1)
SODIUM: 139 mmol/L (ref 135–145)

## 2017-10-05 LAB — RAPID URINE DRUG SCREEN, HOSP PERFORMED
AMPHETAMINES: NOT DETECTED
BENZODIAZEPINES: NOT DETECTED
Barbiturates: NOT DETECTED
Cocaine: NOT DETECTED
OPIATES: NOT DETECTED
Tetrahydrocannabinol: NOT DETECTED

## 2017-10-05 LAB — CBC
HEMATOCRIT: 34 % — AB (ref 39.0–52.0)
HEMOGLOBIN: 11 g/dL — AB (ref 13.0–17.0)
MCH: 28.4 pg (ref 26.0–34.0)
MCHC: 32.4 g/dL (ref 30.0–36.0)
MCV: 87.9 fL (ref 78.0–100.0)
Platelets: 120 10*3/uL — ABNORMAL LOW (ref 150–400)
RBC: 3.87 MIL/uL — AB (ref 4.22–5.81)
RDW: 14.4 % (ref 11.5–15.5)
WBC: 14.4 10*3/uL — AB (ref 4.0–10.5)

## 2017-10-05 LAB — BLOOD CULTURE ID PANEL (REFLEXED)
Acinetobacter baumannii: NOT DETECTED
CANDIDA KRUSEI: NOT DETECTED
CANDIDA PARAPSILOSIS: NOT DETECTED
CANDIDA TROPICALIS: NOT DETECTED
Candida albicans: NOT DETECTED
Candida glabrata: NOT DETECTED
Carbapenem resistance: NOT DETECTED
ENTEROBACTER CLOACAE COMPLEX: NOT DETECTED
Enterobacteriaceae species: DETECTED — AB
Enterococcus species: NOT DETECTED
Escherichia coli: NOT DETECTED
HAEMOPHILUS INFLUENZAE: NOT DETECTED
KLEBSIELLA OXYTOCA: NOT DETECTED
KLEBSIELLA PNEUMONIAE: DETECTED — AB
Listeria monocytogenes: NOT DETECTED
Neisseria meningitidis: NOT DETECTED
PROTEUS SPECIES: NOT DETECTED
Pseudomonas aeruginosa: NOT DETECTED
Serratia marcescens: NOT DETECTED
Staphylococcus aureus (BCID): NOT DETECTED
Staphylococcus species: NOT DETECTED
Streptococcus agalactiae: NOT DETECTED
Streptococcus pneumoniae: NOT DETECTED
Streptococcus pyogenes: NOT DETECTED
Streptococcus species: NOT DETECTED

## 2017-10-05 LAB — RESPIRATORY PANEL BY PCR
Adenovirus: NOT DETECTED
Bordetella pertussis: NOT DETECTED
CHLAMYDOPHILA PNEUMONIAE-RVPPCR: NOT DETECTED
CORONAVIRUS OC43-RVPPCR: NOT DETECTED
Coronavirus 229E: NOT DETECTED
Coronavirus HKU1: NOT DETECTED
Coronavirus NL63: NOT DETECTED
INFLUENZA A-RVPPCR: NOT DETECTED
INFLUENZA B-RVPPCR: NOT DETECTED
MYCOPLASMA PNEUMONIAE-RVPPCR: NOT DETECTED
Metapneumovirus: NOT DETECTED
PARAINFLUENZA VIRUS 4-RVPPCR: NOT DETECTED
Parainfluenza Virus 1: NOT DETECTED
Parainfluenza Virus 2: NOT DETECTED
Parainfluenza Virus 3: NOT DETECTED
RESPIRATORY SYNCYTIAL VIRUS-RVPPCR: NOT DETECTED
RHINOVIRUS / ENTEROVIRUS - RVPPCR: NOT DETECTED

## 2017-10-05 LAB — GLUCOSE, CAPILLARY
GLUCOSE-CAPILLARY: 133 mg/dL — AB (ref 65–99)
GLUCOSE-CAPILLARY: 135 mg/dL — AB (ref 65–99)
GLUCOSE-CAPILLARY: 151 mg/dL — AB (ref 65–99)
GLUCOSE-CAPILLARY: 128 mg/dL — AB (ref 65–99)

## 2017-10-05 LAB — FOLATE RBC
Folate, Hemolysate: 282.5 ng/mL
Folate, RBC: 831 ng/mL (ref >498)
HEMATOCRIT: 34 % — AB (ref 37.5–51.0)

## 2017-10-05 LAB — PROCALCITONIN: PROCALCITONIN: 43.12 ng/mL

## 2017-10-05 LAB — TROPONIN I
Troponin I: <0.03 ng/mL (ref <0.03)
Troponin I: 0.54 ng/mL (ref <0.03)
Troponin I: <0.03 ng/mL (ref <0.03)

## 2017-10-05 LAB — HIV ANTIBODY (ROUTINE TESTING W REFLEX): HIV Screen 4th Generation wRfx: NONREACTIVE

## 2017-10-05 LAB — RPR: RPR Ser Ql: NONREACTIVE

## 2017-10-05 MED ORDER — IBUPROFEN 200 MG PO TABS
400.0000 mg | ORAL_TABLET | Freq: Once | ORAL | Status: AC | PRN
  Administered 2017-10-05: 400 mg via ORAL
  Filled 2017-10-05 (×2): qty 2

## 2017-10-05 MED ORDER — SODIUM CHLORIDE 0.9 % IV SOLN
2.0000 g | INTRAVENOUS | Status: DC
  Administered 2017-10-05: 2 g via INTRAVENOUS
  Filled 2017-10-05 (×2): qty 20

## 2017-10-05 NOTE — Progress Notes (Signed)
Patient's current oral temp 100.7 F  RB paged provider on call - RN received verbal order to recheck oral temp in 1 hour and if oral temp is greater than 100.4 to give the mortin  RN also received critical lab value of tropin 0.54  RN notified provider of the critical lab value   RN will continue to monitor patient

## 2017-10-05 NOTE — Progress Notes (Addendum)
Family Medicine Teaching Service Daily Progress Note Intern Pager: [phone]  Patient name: [name] Williams Medical record number: [id number] Date of birth: 1955-09-24 Age: 62 y.o. Gender: male  Primary Care Provider: Annie SableKohler, Patricia Lynn, NP Consultants: cardiology Code Status: full  Pt Overview and Major Events to Date:  10/04/2017: Admitted, started on vanc/zosyn  Assessment and Plan: [name] Williams is a 62 y.o. male presenting with altered mental status and syncope. PMH is significant for HTN, T2DM, CVA in 2011, HLD, and schizophrenia.   Klebsiella bacteremia 2/2 to UTI:  Pt had Klebsiella pneumoniae growing in blood cultures and had >100K in urine. Antibiotics narrowed to CTX.  Patient's MS mildly improved on aimproved today as he is less somnolent and oriented to year. Given febrile, elevated procalcitonin, and initial presentation, will treat as sepsis and follow blood cultures in addition to consulting cardiology due to concern for ACS given EKG changes and troponin elevation.  - consult cardiology - f/u UDS  - Trend CK  (700s on admission) - f/u Bcx x2, Ucx, RVP for infectious source workup; c - continue vanc/zosyn (2/19-2/20 ); CTX(2/20-) - mIVF 1/2NS at 100cc/hr; s/p 1L total in ED - hold home HTN meds in setting of low-normal BP and concern for sepsis - ondansetron 4mg  Q6H prn nausea - acetaminophen 650mg  Q6H prn pain - hold home donepezil as it can cause sedation - PT/OT consult  Elevated troponin Patient not complaining of chest pain.  Initial trop x2 negative, but third was increased to 0.54 > 0.03. EKG showed NSR. Cardiology was consulted but did not recommend obtaining echocardiogram, with low likely hood of ACS. -Continue to monitor -Echo pending  HTN: Bps 101-150s systolic since admission. 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 Followed by [facility]; receives paliperidone once monthly (last on 2/11). Sister reports he frequently talks to himself and this is unchanged.  - CTM  HLD: reported by patient - f/u lipid panel  FEN/GI: Carb consistent diet Prophylaxis: heparin for PPX, continue home clopidigrel  Disposition: continue inpatient management  Subjective:  Patient denies any pain or complaints this morning. Specifically, he denies abdominal pain, chest pain, headache, SOB, nausea/emesis.   Objective: Temp:  [98.4 F (36.9 C)-103.1 F (39.5 C)] 100.1 F (37.8 C) (02/20 1330) Pulse Rate:  [66-94] 66 (02/20 1330) Resp:  [15-22] 18 (02/20 1330) BP: (110-143)/(56-78) 133/61 (02/20 1330) SpO2:  [96 %-100 %] 98 % (02/20 1330) Physical Exam: General: responds slowly to questions but generally more alert, oriented to person and place, states year correctly but states month as July. No longer diaphoretic HEENT: pupils still constricted (~2-3 mm diameter) but equally round Cardiovascular: RRR, no m/r/g. ?JVD to angle of mandible Respiratory: CTA b/l, normal WOB on RA Abdomen: soft, nontender, normoactive bowel sounds Extremities: WWP, no edema, cap refill <3sec  Laboratory: Recent Labs  Lab 10/04/17 1314 10/04/17 1728 10/05/17 0416  WBC 12.5*  --  14.4*  HGB 11.4*  --  11.0*  HCT 36.2* 34.0*  34.0*  PLT 194  --  120*   Recent Labs  Lab 10/04/17 1518 10/04/17 1728 10/05/17 0416  NA 140  --  139  K 3.8  --  4.0  CL 104  --  104  CO2 23  --  22  BUN 16  --  15  CREATININE 1.34* 1.38* 1.41*  CALCIUM 9.2  --  8.7*  PROT 6.3*  --   --    BILITOT 0.8  --   --   ALKPHOS 48  --   --   ALT 18  --   --   AST 29  --   --   GLUCOSE 169*  --  134*    Imaging/Diagnostic Tests:   Garnette Gunner, MD 10/05/2017, 3:01 PM PGY 1, [facility] FPTS Intern pager: [phone], text pages welcome

## 2017-10-05 NOTE — Evaluation (Signed)
Physical Therapy Evaluation Patient Details Name: [name] Williams MRN: [id number] DOB: Apr 26, 1956 Today's Date: 10/05/2017   History of Present Illness  [name] Williams is a 62 y.o. male presenting with altered mental status and syncope. PMH is significant for HTN, T2DM, CVA in 2011, HLD, and schizophrenia.   Clinical Impression  Patient presents with decreased mobility due to deficits listed in PT problem list.  Patient currently requiring max to total A for mobility and will need post acute inpatient rehab prior to d/c home.  Difficult to ascertain previous functional level with pts lethargy and limited communication, but likely was mobilizing on his own as chart states was able to toilet and feed himself.  PT to follow acutely.       Follow Up Recommendations CIR    Equipment Recommendations  Other (comment)(TBA)    Recommendations for Other Services Rehab consult;Speech consult     Precautions / Restrictions Precautions Precautions: Fall      Mobility  Bed Mobility Overal bed mobility: Needs Assistance Bed Mobility: Rolling;Sidelying to Sit;Sit to Supine Rolling: Max assist Sidelying to sit: HOB elevated;Total assist   Sit to supine: Total assist   General bed mobility comments: assist for all aspects of activity, to roll, reach for rail, to bring legs off bed and to lift trunk upright; to supine assist for legs and trunk  Transfers Overall transfer level: Needs assistance   Transfers: Sit to/from Stand Sit to Stand: Max assist         General transfer comment: lifting and lowering assist, [facility] despite assist with weight shift  Ambulation/Gait                Stairs            Wheelchair Mobility    Modified Rankin (Stroke Patients Only) Modified Rankin (Stroke Patients Only) Pre-Morbid Rankin Score: Moderate disability Modified Rankin: Severe disability     Balance Overall balance assessment: Needs  assistance Sitting-balance support: Feet supported Sitting balance-Leahy Scale: Zero Sitting balance - Comments: initially needed max A for balance, once feet placed and cues for head up balances with min A to mod A. Not attempting to use UE's to assist with balance     Standing balance-Leahy Scale: Poor Standing balance comment: stood with mod A for balance but only maintained briefly                             Pertinent Vitals/Pain Pain Assessment: No/denies pain    Home Living Family/patient expects to be discharged to:: Private residence Living Arrangements: Other relatives(sister) Available Help at Discharge: Family Type of Home: House       Home Layout: One level   Additional Comments: reports sister is retired    Prior Comptrollerunction                 Hand Dominance   Dominant Hand: Right    Extremity/Trunk Assessment   Upper Extremity Assessment Upper Extremity Assessment: LUE deficits/detail;RUE deficits/detail RUE Deficits / Details: weakly grips, minimal active movement in UE, PROM WFL, low tone noted throughout LUE Deficits / Details: weakly grips, minimal active movement in UE, PROM WFL, low tone noted throughout    Lower Extremity Assessment Lower Extremity Assessment: RLE deficits/detail;LLE deficits/detail RLE Deficits / Details: PROM grossly WFL tight heel cords, minimal active movement noted in supine, supported weight temporarily in standingt LLE Deficits / Details: PROM grossly WFL tight  heel cords, minimal active movement noted in supine, supported weight temporarily in standingt       Communication   Communication: Expressive difficulties(dysarthria)  Cognition Arousal/Alertness: Lethargic Behavior During Therapy: Flat affect Overall Cognitive Status: Difficult to assess                                        General Comments      Exercises     Assessment/Plan    PT Assessment Patient needs continued PT  services  PT Problem List Decreased strength;Decreased mobility;Decreased balance;Decreased knowledge of use of DME;Decreased activity tolerance       PT Treatment Interventions DME instruction;Functional mobility training;Balance training;Patient/family education;Therapeutic activities;Therapeutic exercise;Wheelchair mobility training    PT Goals (Current goals can be found in the Care Plan section)  Acute Rehab PT Goals Patient Stated Goal: unable to state PT Goal Formulation: Patient unable to participate in goal setting Time For Goal Achievement: 10/19/17 Potential to Achieve Goals: Fair    Frequency Min 3X/week   Barriers to discharge        Co-evaluation               AM-PAC PT "6 Clicks" Daily Activity  Outcome Measure Difficulty turning over in bed (including adjusting bedclothes, sheets and blankets)?: Unable Difficulty moving from lying on back to sitting on the side of the bed? : Unable Difficulty sitting down on and standing up from a chair with arms (e.g., wheelchair, bedside commode, etc,.)?: Unable Help needed moving to and from a bed to chair (including a wheelchair)?: Total Help needed walking in hospital room?: Total Help needed climbing 3-5 steps with a railing? : Total 6 Click Score: 6    End of Session Equipment Utilized During Treatment: Gait belt Activity Tolerance: Patient limited by fatigue Patient left: in bed;with call bell/phone within reach;with bed alarm set Nurse Communication: Mobility status PT Visit Diagnosis: Muscle weakness (generalized) (M62.81);History of falling (Z91.81);Other symptoms and signs involving the nervous system (Z61.096)    Time: 0454-0981 PT Time Calculation (min) (ACUTE ONLY): 19 min   Charges:   PT Evaluation $PT Eval High Complexity: 1 High     PT G CodesSheran Lawless, [place]rick [phone] 10/05/2017   Elray Mcgregor 10/05/2017, 2:10 PM

## 2017-10-05 NOTE — Consult Note (Addendum)
Cardiology Consultation:   Patient ID: [name] Williams; [id number]; 1956/07/15   Admit date: 10/04/2017 Date of Consult: 10/05/2017  Primary Care Provider: Annie Sable, NP Primary Cardiologist: New to [facility], Dr. Eden Emms Primary Electrophysiologist:  None   Patient Profile:   [name] Williams is a 62 y.o. male with PMH of HTN, DM, HLD, chronic systolic heart failure (last echo 06/2014 with mildly reduced EF of 45-50%), multiple CVAs (2011, 2015), and schizophrenia, who is being seen today for the evaluation of EKG changes and elevated troponin at the request of Dr. Deirdre Priest.  History of Present Illness:   Mr. Life is a 62 y.o. male with PMH of HTN, DM, HLD, chronic systolic heart failure (last echo 06/2014 with mildly reduced EF of 45-50%), multiple CVAs (2011, 2015), and schizophrenia, who presented to [facility] ED 10/04/17 s/p unwitnessed fall with AMS.   History primarily obtained from sister and chart review as patient is a poor historian 2/2 CVA. Per sister, who the patient lives with, he has had increasing frequency of unwitnessed falls in the past 3 weeks. Patient typically ambulates with a walker and she reports finding him on the floor occasionally. She states he has been experiencing increasing weakness with occasional SOB over the past 2-3 weeks. She states the falls typically occur when the patient attempts to get out of bed. On the morning of 10/04/17, patients sister states he was ambulating with his walker around 9am when he experienced a fall (unwitnessed). He denies head strike, but reported feeling cold and dizzy surrounding the fall. His sister was unable to pick him up, and he was shaking from being cold, therefore EMS was activated.   Sister states he has never complained of chest pain. Risk factors for CAD include HTN, HLD, DM, CVA, and family history of premature CAD (mom deceased from MI in her 43s, sister deceased from MI in 83s). Up until 5-6 months ago he was a  heavy smoker, now he smokes sparingly as she does not provide him with cigarettes. He receives most of his care at [facility], however she is unsure if he has ever had a stress test.   ROS notable for loose stools x4-5 days.   Hospital course: Febrile (Tmax 103.1; currently 99.7), BP labile, intermittently tachycardic/tachypneic, satting well on RA. Labs today notable for Na 139, K 4.0, Cr 1.41 (baseline 1.0-1.1), WBC 14.4, Hgb 11.0, Hgb A1C 7.7, TSH wnl, Procal 43, Lactate 2.46>1.52, Trop <0.03x2, 0.54 at 04:16, now <0.03. EKG with NSR, no STE/D, no TWI.  Past Medical History:  Diagnosis Date  . Diabetes mellitus   . History of stroke   . Hypertension     History reviewed. No pertinent surgical history.   Home Medications:  Prior to Admission medications   Medication Sig Start Date End Date Taking? Authorizing Provider  amLODipine (NORVASC) 5 MG tablet Take 5 mg by mouth daily.   Yes [provider]  cholecalciferol (VITAMIN D) 1000 units tablet Take 1,000 Units by mouth daily.   Yes [provider]  clopidogrel (PLAVIX) 75 MG tablet Take 1 tablet (75 mg total) by mouth daily. 07/05/14  Yes Angiulli, Mcarthur Rossetti, PA-C  donepezil (ARICEPT) 10 MG tablet Take 10 mg by mouth at bedtime.   Yes [provider]  lisinopril-hydrochlorothiazide (PRINZIDE,ZESTORETIC) 20-12.5 MG tablet Take 1 tablet by mouth daily.   Yes [provider]  metFORMIN (GLUCOPHAGE) 1000 MG tablet TAKE 0.5 TABLETS (500 MG TOTAL) BY MOUTH 2 (TWO) TIMES DAILY WITH A  MEAL. Patient taking differently: Take 500 mg by mouth 2 (two) times daily with a meal.  07/05/14  Yes Angiulli, Mcarthur Rossetti, PA-C  paliperidone (INVEGA SUSTENNA) 156 MG/ML SUSP injection Inject 156 mg into the muscle every 28 (twenty-eight) days.   Yes [provider]  aspirin 81 MG EC tablet TAKE 1 TABLET (81 MG TOTAL) BY MOUTH DAILY. Patient not taking: Reported on 10/04/2017 10/16/14   Denton Brick, MD  aspirin EC 81 MG  tablet Take 1 tablet (81 mg total) by mouth daily. Patient not taking: Reported on 10/04/2017 07/05/14   Angiulli, Mcarthur Rossetti, PA-C  atorvastatin (LIPITOR) 40 MG tablet Take 1 tablet (40 mg total) by mouth daily at 6 PM. Patient not taking: Reported on 10/04/2017 07/05/14   Angiulli, Mcarthur Rossetti, PA-C  lisinopril (PRINIVIL,ZESTRIL) 10 MG tablet Take 1 tablet (10 mg total) by mouth daily. Patient not taking: Reported on 10/04/2017 07/05/14   Charlton Amor, PA-C    Inpatient Medications: Scheduled Meds: . cholecalciferol  1,000 Units Oral Daily  . clopidogrel  75 mg Oral Daily  . heparin  5,000 Units Subcutaneous Q8H  . insulin aspart  0-15 Units Subcutaneous TID WC   Continuous Infusions: . sodium chloride 100 mL/hr at 10/04/17 2223  . cefTRIAXone (ROCEPHIN)  IV    . sodium chloride     PRN Meds: acetaminophen **OR** acetaminophen, ondansetron **OR** ondansetron (ZOFRAN) IV, polyethylene glycol  Allergies:   No Known Allergies  Social History:   Social History   Socioeconomic History  . Marital status: Legally Separated    Spouse name: Not on file  . Number of children: 0  . Years of education: 12th  . Highest education level: Not on file  Social Needs  . Financial resource strain: Not on file  . Food insecurity - worry: Not on file  . Food insecurity - inability: Not on file  . Transportation needs - medical: Not on file  . Transportation needs - non-medical: Not on file  Occupational History  . Not on file  Tobacco Use  . Smoking status: Current Every Day Smoker    Packs/day: 1.00    Years: 10.00    Pack years: 10.00    Types: Cigarettes  . Smokeless tobacco: Never Used  Substance and Sexual Activity  . Alcohol use: No  . Drug use: Yes    Comment: marijuana occassionally   . Sexual activity: No  Other Topics Concern  . Not on file  Social History Narrative   Does regular exercise.   Has never worked, sister takes care of him    Family History:    Family  History  Problem Relation Age of Onset  . Hypertension Mother   . Heart attack Mother        deceased from MI in 90s  . Hypertension Father   . Coronary artery disease Maternal Aunt   . Diabetes Maternal Aunt   . Coronary artery disease Maternal Uncle   . Diabetes Maternal Uncle   . Diabetes Paternal Aunt   . Coronary artery disease Paternal Aunt   . Diabetes Paternal Uncle   . Coronary artery disease Paternal Uncle   . Heart attack Sister        deceased from MI in 28s     ROS:  Please see the history of present illness.   All other ROS reviewed and negative.     Physical Exam/Data:   Vitals:   10/05/17 4098 10/05/17 0740 10/05/17 1191 10/05/17  1008  BP:  135/64    Pulse:  73    Resp:  18    Temp: (!) 100.7 F (38.2 C) (!) 100.8 F (38.2 C) 99.9 F (37.7 C) 99.7 F (37.6 C)  TempSrc: Oral Oral Oral Oral  SpO2:  99%    Weight:      Height:        Intake/Output Summary (Last 24 hours) at 10/05/2017 1254 Last data filed at 10/05/2017 1100 Gross per 24 hour  Intake 2771.67 ml  Output -  Net 2771.67 ml   Filed Weights   10/04/17 1415  Weight: 150 lb (68 kg)   Body mass index is 22.15 kg/m.  General:  Somnolent, chronically ill appearing male, laying in bed in no acute distress HEENT: sclera anicteric  Neck: no JVD Vascular: No carotid bruits; distal pulses 2+ bilaterally Cardiac:  normal S1, S2; RRR; no murmur, gallops, or rubs Lungs:  Normal work of breathing, +rhonchi, no wheezing or rales Abd: NABS, soft, nontender, no hepatomegaly Ext: no edema Musculoskeletal:  No deformities; residual R sided weakness  Skin: warm and dry  Neuro:  R sided weakness Psych:  Flat affect   EKG:  The EKG was personally reviewed and demonstrates:  NSR no STE/D, no TWI Telemetry:  Telemetry was personally reviewed and demonstrates:  NSR with occasional PVCs  Relevant CV Studies: Echo 06/2014: Study Conclusions  - Left ventricle: The cavity size was normal. Systolic  function was mildly reduced. The estimated ejection fraction was in the range of 45% to 50%. Diffuse hypokinesis. Doppler parameters are consistent with abnormal left ventricular relaxation (grade 1 diastolic dysfunction). - Mitral valve: There was mild regurgitation. - Right ventricle: The cavity size was mildly dilated. Wall thickness was normal. - Right atrium: The atrium was mildly dilated. - Pulmonary arteries: Systolic pressure was mildly increased. PA peak pressure: 32 mm Hg (S).  Impressions:  - No cardiac source of emboli was indentified.   Laboratory Data:  Chemistry Recent Labs  Lab 10/04/17 1518 10/04/17 1728 10/05/17 0416  NA 140  --  139  K 3.8  --  4.0  CL 104  --  104  CO2 23  --  22  GLUCOSE 169*  --  134*  BUN 16  --  15  CREATININE 1.34* 1.38* 1.41*  CALCIUM 9.2  --  8.7*  GFRNONAA 56* 54* 52*  GFRAA >60 >60 >60  ANIONGAP 13  --  13    Recent Labs  Lab 10/04/17 1518  PROT 6.3*  ALBUMIN 3.3*  AST 29  ALT 18  ALKPHOS 48  BILITOT 0.8   Hematology Recent Labs  Lab 10/04/17 1314 10/05/17 0416  WBC 12.5* 14.4*  RBC 4.16* 3.87*  HGB 11.4* 11.0*  HCT 36.2* 34.0*  MCV 87.0 87.9  MCH 27.4 28.4  MCHC 31.5 32.4  RDW 14.1 14.4  PLT 194 120*   Cardiac Enzymes Recent Labs  Lab 10/04/17 1728 10/04/17 2255 10/05/17 0416 10/05/17 0917  TROPONINI <0.03 <0.03 0.54* <0.03    Recent Labs  Lab 10/04/17 1346  TROPIPOC 0.00    BNPNo results for input(s): BNP, PROBNP in the last 168 hours.  DDimer No results for input(s): DDIMER in the last 168 hours.  Radiology/Studies:  Dg Chest 2 View  Result Date: 10/04/2017 CLINICAL DATA:  62 year old male with dizziness, slight dyspnea and chest pain. Recent fall. EXAM: CHEST  2 VIEW COMPARISON:  05/06/2016 FINDINGS: The heart size and mediastinal contours are within normal limits.  Lung volumes are slightly low relative to previous exam. This may explain the crowding of interstitial lung  markings probable atelectasis at the left lung base. The visualized skeletal structures are unremarkable. IMPRESSION: No active cardiopulmonary disease. Slightly low lung volumes with left basilar atelectasis. Electronically Signed   By: Tollie Ethavid  Kwon M.D.   On: 10/04/2017 14:11   Ct Head Wo Contrast  Result Date: 10/04/2017 CLINICAL DATA:  Dizziness and fall. EXAM: CT HEAD WITHOUT CONTRAST CT CERVICAL SPINE WITHOUT CONTRAST TECHNIQUE: Multidetector CT imaging of the head and cervical spine was performed following the standard protocol without intravenous contrast. Multiplanar CT image reconstructions of the cervical spine were also generated. COMPARISON:  CT and MRI head dated June 23, 2014. FINDINGS: CT HEAD FINDINGS Brain: No evidence of acute infarction, hemorrhage, hydrocephalus, extra-axial collection or mass lesion/mass effect. Unchanged right cerebellar infarct. Unchanged lacunar infarcts in the bilateral basal ganglia and thalami. Stable mild cerebral atrophy and chronic microvascular ischemic changes. The brainstem is mildly atrophic. Vascular: Atherosclerotic vascular calcification of the carotid siphons. No hyperdense vessel. Skull: Normal. Negative for fracture or focal lesion. Sinuses/Orbits: No acute finding. Small mucous retention cysts in the bilateral maxillary sinuses. Other: None. CT CERVICAL SPINE FINDINGS Alignment: Trace retrolisthesis at C4-C5. No traumatic malalignment. Skull base and vertebrae: No acute fracture. No primary bone lesion or focal pathologic process. Auto fusion of the C2-C3 vertebral bodies and facets. Soft tissues and spinal canal: No prevertebral fluid or swelling. No visible canal hematoma. Disc levels: Mild degenerative disc disease and moderate left uncovertebral hypertrophy at C4-C5, moderately narrowing the left neural foramen. Upper chest: Mild centrilobular emphysema in the lung apices. Otherwise negative. Other: None. IMPRESSION: 1. No acute intracranial  abnormality. Several chronic infarcts, as described above. 2.  No acute cervical spine fracture. Electronically Signed   By: Obie DredgeWilliam T Derry M.D.   On: 10/04/2017 13:56   Ct Cervical Spine Wo Contrast  Result Date: 10/04/2017 CLINICAL DATA:  Dizziness and fall. EXAM: CT HEAD WITHOUT CONTRAST CT CERVICAL SPINE WITHOUT CONTRAST TECHNIQUE: Multidetector CT imaging of the head and cervical spine was performed following the standard protocol without intravenous contrast. Multiplanar CT image reconstructions of the cervical spine were also generated. COMPARISON:  CT and MRI head dated June 23, 2014. FINDINGS: CT HEAD FINDINGS Brain: No evidence of acute infarction, hemorrhage, hydrocephalus, extra-axial collection or mass lesion/mass effect. Unchanged right cerebellar infarct. Unchanged lacunar infarcts in the bilateral basal ganglia and thalami. Stable mild cerebral atrophy and chronic microvascular ischemic changes. The brainstem is mildly atrophic. Vascular: Atherosclerotic vascular calcification of the carotid siphons. No hyperdense vessel. Skull: Normal. Negative for fracture or focal lesion. Sinuses/Orbits: No acute finding. Small mucous retention cysts in the bilateral maxillary sinuses. Other: None. CT CERVICAL SPINE FINDINGS Alignment: Trace retrolisthesis at C4-C5. No traumatic malalignment. Skull base and vertebrae: No acute fracture. No primary bone lesion or focal pathologic process. Auto fusion of the C2-C3 vertebral bodies and facets. Soft tissues and spinal canal: No prevertebral fluid or swelling. No visible canal hematoma. Disc levels: Mild degenerative disc disease and moderate left uncovertebral hypertrophy at C4-C5, moderately narrowing the left neural foramen. Upper chest: Mild centrilobular emphysema in the lung apices. Otherwise negative. Other: None. IMPRESSION: 1. No acute intracranial abnormality. Several chronic infarcts, as described above. 2.  No acute cervical spine fracture.  Electronically Signed   By: Obie DredgeWilliam T Derry M.D.   On: 10/04/2017 13:56    Assessment and Plan:   1. Elevated troponin: Patient without complaints of CP  or SOB, although per sister she has noticed mild SOB with activity over the past 2-3 weeks with associated weakness. He experienced a fall 10/04/17 associated with dizziness and rigors. Now admitted with sepsis. Initial trop 0.03>0.03>0.54>0.03. EKG without ischemic changes. Patient was persistently febrile overnight with unclear source of infection. Troponin elevation likely 2/2 demand ischemia in the setting of sepsis as patient without clear anginal symptoms. He does have significant risk factors for CAD, including: HTN, HLD, DM, CVA, and family history of premature CAD (mom deceased from MI in her 9s, sister deceased from MI in 44s), as well as former heavy smoking.  - Will obtain echocardiogram to evaluate for change in EF, valvular function, or wall motion.  - Further ischemic work up pending echo results  2. HTN: BP stable - Antihypertensives on hold in the setting of sepsis - Can restart as tolerated.  3. Chronic systolic heart failure: last echo 06/2014 with EF 45-50%, diffuse hypokinesis. Appears euvolemic - F/u Echo results   4. Sepsis: unclear source at this time. Procal elevated - Continue management per primary team  5. HLD:  - Continue atorvastatin  6. History of CVA: CT head without acute findings - Continue ASA and plavix  7. DM: Hgb A1C 7.7 - Continue management per primary team  For questions or updates, please contact [facility] Please consult [email or web address] for contact info under Cardiology/STEMI.   Signed, Beatriz Stallion, PA-C  10/05/2017 12:54 PM [phone]  Patient examined chart reviewed Discussed care with patient , sister and PA. No history of heart disease Previous CVA with leg weakness uses walker and rarely leaves house. Admitted with sepsis, falls. Previous EF 45-50% by echo 2015 Minimally  elevated troponin with no acute ECG changes and no chest pain. No indication for stress testing will order echo to make sure EF not markedly changed Continue Rx DM, and sepsis per primary service. Given functional state and co morbidities do not think aggressive w/u indicated in patient with no chest pain or overt CHF  Charlton Haws

## 2017-10-05 NOTE — Progress Notes (Addendum)
Patient current oral temp of 102.3.  RN gave tylenol at 10/04/17 at 2144 which is q6    Rectal temp 102.4  RN notified provider on call - received verbal okay to give tylenol early  RN will continue to monitor patient

## 2017-10-05 NOTE — Progress Notes (Addendum)
Patient's current oral temp is 103.1 F  RN paged provider on call  - RN received verbal order to recheck oral temperature in 1 hour and if oral temp still elevated to give motrin   RN will continue to monitor patient

## 2017-10-06 ENCOUNTER — Inpatient Hospital Stay (HOSPITAL_COMMUNITY): Payer: Medicare (Managed Care)

## 2017-10-06 DIAGNOSIS — I361 Nonrheumatic tricuspid (valve) insufficiency: Secondary | ICD-10-CM

## 2017-10-06 DIAGNOSIS — Z8673 Personal history of transient ischemic attack (TIA), and cerebral infarction without residual deficits: Secondary | ICD-10-CM

## 2017-10-06 DIAGNOSIS — I110 Hypertensive heart disease with heart failure: Secondary | ICD-10-CM

## 2017-10-06 DIAGNOSIS — G9340 Encephalopathy, unspecified: Secondary | ICD-10-CM

## 2017-10-06 DIAGNOSIS — R269 Unspecified abnormalities of gait and mobility: Secondary | ICD-10-CM

## 2017-10-06 DIAGNOSIS — N10 Acute tubulo-interstitial nephritis: Secondary | ICD-10-CM

## 2017-10-06 DIAGNOSIS — R5381 Other malaise: Secondary | ICD-10-CM

## 2017-10-06 DIAGNOSIS — R42 Dizziness and giddiness: Secondary | ICD-10-CM

## 2017-10-06 DIAGNOSIS — R7989 Other specified abnormal findings of blood chemistry: Secondary | ICD-10-CM

## 2017-10-06 DIAGNOSIS — R778 Other specified abnormalities of plasma proteins: Secondary | ICD-10-CM

## 2017-10-06 DIAGNOSIS — I5022 Chronic systolic (congestive) heart failure: Secondary | ICD-10-CM | POA: Diagnosis present

## 2017-10-06 LAB — BASIC METABOLIC PANEL
Anion gap: 9 (ref 5–15)
BUN: 15 mg/dL (ref 6–20)
CALCIUM: 8.4 mg/dL — AB (ref 8.9–10.3)
CO2: 24 mmol/L (ref 22–32)
CREATININE: 1.16 mg/dL (ref 0.61–1.24)
Chloride: 104 mmol/L (ref 101–111)
GFR calc non Af Amer: >60 mL/min (ref >60)
GLUCOSE: 98 mg/dL (ref 65–99)
Potassium: 3.9 mmol/L (ref 3.5–5.1)
Sodium: 137 mmol/L (ref 135–145)

## 2017-10-06 LAB — CBC
HCT: 33.6 % — ABNORMAL LOW (ref 39.0–52.0)
Hemoglobin: 10.5 g/dL — ABNORMAL LOW (ref 13.0–17.0)
MCH: 27 pg (ref 26.0–34.0)
MCHC: 31.3 g/dL (ref 30.0–36.0)
MCV: 86.4 fL (ref 78.0–100.0)
PLATELETS: 115 10*3/uL — AB (ref 150–400)
RBC: 3.89 MIL/uL — AB (ref 4.22–5.81)
RDW: 14.1 % (ref 11.5–15.5)
WBC: 12.4 10*3/uL — ABNORMAL HIGH (ref 4.0–10.5)

## 2017-10-06 LAB — GLUCOSE, CAPILLARY
GLUCOSE-CAPILLARY: 95 mg/dL (ref 65–99)
GLUCOSE-CAPILLARY: 154 mg/dL — AB (ref 65–99)
GLUCOSE-CAPILLARY: 153 mg/dL — AB (ref 65–99)
Glucose-Capillary: 121 mg/dL — ABNORMAL HIGH (ref 65–99)

## 2017-10-06 LAB — URINE CULTURE

## 2017-10-06 LAB — ECHOCARDIOGRAM COMPLETE
HEIGHTINCHES: 69 in
WEIGHTICAEL: 2400 [oz_av]

## 2017-10-06 MED ORDER — ASPIRIN 81 MG PO CHEW
81.0000 mg | CHEWABLE_TABLET | Freq: Every day | ORAL | Status: DC
  Administered 2017-10-06 – 2017-10-07 (×2): 81 mg via ORAL
  Filled 2017-10-06 (×2): qty 1

## 2017-10-06 MED ORDER — CEPHALEXIN 500 MG PO CAPS
500.0000 mg | ORAL_CAPSULE | Freq: Four times a day (QID) | ORAL | Status: DC
  Administered 2017-10-06 – 2017-10-10 (×16): 500 mg via ORAL
  Filled 2017-10-06 (×17): qty 1

## 2017-10-06 NOTE — Progress Notes (Signed)
Progress Note  Patient Name: [name] Williams Date of Encounter: 10/06/2017  Primary Cardiologist: No primary care provider on file.   Subjective   No cardiac compliants  Inpatient Medications    Scheduled Meds: . aspirin  81 mg Oral Daily  . cephALEXin  500 mg Oral Q6H  . cholecalciferol  1,000 Units Oral Daily  . heparin  5,000 Units Subcutaneous Q8H  . insulin aspart  0-15 Units Subcutaneous TID WC   Continuous Infusions: . sodium chloride     PRN Meds: acetaminophen **OR** acetaminophen, ondansetron **OR** ondansetron (ZOFRAN) IV, polyethylene glycol   Vital Signs    Vitals:   10/06/17 0300 10/06/17 0427 10/06/17 0803 10/06/17 1158  BP: (!) 132/58 140/60 (!) 141/63 133/69  Pulse:  (!) 56  (!) 54  Resp:  18 16 20   Temp:  98.7 F (37.1 C) 98.4 F (36.9 C) 99.1 F (37.3 C)  TempSrc:  Oral Oral Oral  SpO2:  95% 98% 97%  Weight:      Height:        Intake/Output Summary (Last 24 hours) at 10/06/2017 1200 Last data filed at 10/06/2017 0858 Gross per 24 hour  Intake 1626.67 ml  Output 2000 ml  Net -373.33 ml   Filed Weights   10/04/17 1415  Weight: 150 lb (68 kg)    Telemetry    NSR  - Personally Reviewed  ECG    NSR rate 61 poor R wave progression no acute changes  - Personally Reviewed  Physical Exam  Chronically ill black male  GEN: No acute distress.   Neck: No JVD Cardiac: RRR, no murmurs, rubs, or gallops.  Respiratory: Clear to auscultation bilaterally. GI: Soft, nontender, non-distended  MS: No edema; No deformity. Neuro:  LE weakness chronic cognitive deficits  Psych: Normal affect   Labs    Chemistry Recent Labs  Lab 10/04/17 1518 10/04/17 1728 10/05/17 0416 10/06/17 0409  NA 140  --  139 137  K 3.8  --  4.0 3.9  CL 104  --  104 104  CO2 23  --  22 24  GLUCOSE 169*  --  134* 98  BUN 16  --  15 15  CREATININE 1.34* 1.38* 1.41* 1.16  CALCIUM 9.2  --  8.7* 8.4*  PROT 6.3*  --   --   --   ALBUMIN 3.3*  --   --   --   AST 29   --   --   --   ALT 18  --   --   --   ALKPHOS 48  --   --   --   BILITOT 0.8  --   --   --   GFRNONAA 56* 54* 52* >60  GFRAA >60 >60 >60 >60  ANIONGAP 13  --  13 9     Hematology Recent Labs  Lab 10/04/17 1314 10/04/17 1728 10/05/17 0416 10/06/17 0409  WBC 12.5*  --  14.4* 12.4*  RBC 4.16*  --  3.87* 3.89*  HGB 11.4*  --  11.0* 10.5*  HCT 36.2* 34.0* 34.0* 33.6*  MCV 87.0  --  87.9 86.4  MCH 27.4  --  28.4 27.0  MCHC 31.5  --  32.4 31.3  RDW 14.1  --  14.4 14.1  PLT 194  --  120* 115*    Cardiac Enzymes Recent Labs  Lab 10/04/17 1728 10/04/17 2255 10/05/17 0416 10/05/17 0917  TROPONINI <0.03 <0.03 0.54* <0.03    Recent Labs  Lab 10/04/17 1346  TROPIPOC 0.00     BNPNo results for input(s): BNP, PROBNP in the last 168 hours.   DDimer No results for input(s): DDIMER in the last 168 hours.   Radiology    Dg Chest 2 View  Result Date: 10/04/2017 CLINICAL DATA:  62 year old male with dizziness, slight dyspnea and chest pain. Recent fall. EXAM: CHEST  2 VIEW COMPARISON:  05/06/2016 FINDINGS: The heart size and mediastinal contours are within normal limits. Lung volumes are slightly low relative to previous exam. This may explain the crowding of interstitial lung markings probable atelectasis at the left lung base. The visualized skeletal structures are unremarkable. IMPRESSION: No active cardiopulmonary disease. Slightly low lung volumes with left basilar atelectasis. Electronically Signed   By: Tollie Eth M.D.   On: 10/04/2017 14:11   Ct Head Wo Contrast  Result Date: 10/04/2017 CLINICAL DATA:  Dizziness and fall. EXAM: CT HEAD WITHOUT CONTRAST CT CERVICAL SPINE WITHOUT CONTRAST TECHNIQUE: Multidetector CT imaging of the head and cervical spine was performed following the standard protocol without intravenous contrast. Multiplanar CT image reconstructions of the cervical spine were also generated. COMPARISON:  CT and MRI head dated June 23, 2014. FINDINGS: CT HEAD  FINDINGS Brain: No evidence of acute infarction, hemorrhage, hydrocephalus, extra-axial collection or mass lesion/mass effect. Unchanged right cerebellar infarct. Unchanged lacunar infarcts in the bilateral basal ganglia and thalami. Stable mild cerebral atrophy and chronic microvascular ischemic changes. The brainstem is mildly atrophic. Vascular: Atherosclerotic vascular calcification of the carotid siphons. No hyperdense vessel. Skull: Normal. Negative for fracture or focal lesion. Sinuses/Orbits: No acute finding. Small mucous retention cysts in the bilateral maxillary sinuses. Other: None. CT CERVICAL SPINE FINDINGS Alignment: Trace retrolisthesis at C4-C5. No traumatic malalignment. Skull base and vertebrae: No acute fracture. No primary bone lesion or focal pathologic process. Auto fusion of the C2-C3 vertebral bodies and facets. Soft tissues and spinal canal: No prevertebral fluid or swelling. No visible canal hematoma. Disc levels: Mild degenerative disc disease and moderate left uncovertebral hypertrophy at C4-C5, moderately narrowing the left neural foramen. Upper chest: Mild centrilobular emphysema in the lung apices. Otherwise negative. Other: None. IMPRESSION: 1. No acute intracranial abnormality. Several chronic infarcts, as described above. 2.  No acute cervical spine fracture. Electronically Signed   By: Obie Dredge M.D.   On: 10/04/2017 13:56   Ct Cervical Spine Wo Contrast  Result Date: 10/04/2017 CLINICAL DATA:  Dizziness and fall. EXAM: CT HEAD WITHOUT CONTRAST CT CERVICAL SPINE WITHOUT CONTRAST TECHNIQUE: Multidetector CT imaging of the head and cervical spine was performed following the standard protocol without intravenous contrast. Multiplanar CT image reconstructions of the cervical spine were also generated. COMPARISON:  CT and MRI head dated June 23, 2014. FINDINGS: CT HEAD FINDINGS Brain: No evidence of acute infarction, hemorrhage, hydrocephalus, extra-axial collection or  mass lesion/mass effect. Unchanged right cerebellar infarct. Unchanged lacunar infarcts in the bilateral basal ganglia and thalami. Stable mild cerebral atrophy and chronic microvascular ischemic changes. The brainstem is mildly atrophic. Vascular: Atherosclerotic vascular calcification of the carotid siphons. No hyperdense vessel. Skull: Normal. Negative for fracture or focal lesion. Sinuses/Orbits: No acute finding. Small mucous retention cysts in the bilateral maxillary sinuses. Other: None. CT CERVICAL SPINE FINDINGS Alignment: Trace retrolisthesis at C4-C5. No traumatic malalignment. Skull base and vertebrae: No acute fracture. No primary bone lesion or focal pathologic process. Auto fusion of the C2-C3 vertebral bodies and facets. Soft tissues and spinal canal: No prevertebral fluid or swelling. No visible canal hematoma. Disc  levels: Mild degenerative disc disease and moderate left uncovertebral hypertrophy at C4-C5, moderately narrowing the left neural foramen. Upper chest: Mild centrilobular emphysema in the lung apices. Otherwise negative. Other: None. IMPRESSION: 1. No acute intracranial abnormality. Several chronic infarcts, as described above. 2.  No acute cervical spine fracture. Electronically Signed   By: Obie DredgeWilliam T Derry M.D.   On: 10/04/2017 13:56    Cardiac Studies   Echo reviewed this am EF 50-55% improved from 2015   Patient Profile     62 y.o. male admitted with falls and AMS. History of EF 45-50% by echo 2015 no clinical CHF, multiple strokes , Schizophrenia , Cognitive Deficits seen for abnormal ECG and one elevated troponin   Assessment & Plan    1. Troponin :  1/4 positive .54 no chest pain or history of CAD ECG no acute changes No indication for further cardiac testing or ischemic evaluation 2. Abnormal ECG poor R wave progression no acute ST/T wave changes 3. CHF: euvolemic echo from today reviewed and EF improved from 2015 50-55% Ok to resume low dose lisinopril per  primary service Cr is now normal   Will sign off   For questions or updates, please contact [facility] Please consult [email or web address] for contact info under Cardiology/STEMI.      Signed, Charlton HawsPeter Dacota Devall, MD  10/06/2017, 12:00 PM

## 2017-10-06 NOTE — Plan of Care (Signed)
  Education: Knowledge of General Education information will improve 10/06/2017 2058 - Progressing by Luther Redourgott, Hartley Urton, RN   Clinical Measurements: Ability to maintain clinical measurements within normal limits will improve 10/06/2017 2058 - Progressing by Luther Redourgott, Octavia Mottola, RN

## 2017-10-06 NOTE — Progress Notes (Signed)
Inpatient Rehabilitation  Met with patient at bedside to discuss team's recommendation for IP Rehab.  Shared booklets, insurance verification letter, and answered initial questions.  Note that patient is covered by [employer] and received notification that Earnest Bailey was following his case.  I have called Earnest Bailey, left a message, and await a return call.  Plan to follow for timing of medical readiness, insurance authorization, and IP Rehab bed availability.  Call if questions.    Carmelia Roller., CCC/SLP Admission Coordinator  [place]  Cell [phone]

## 2017-10-06 NOTE — Consult Note (Signed)
Physical Medicine and Rehabilitation Consult   Reason for Consult: Fictional deficits due to bacteremia/sepsis Referring Physician: Dr. Deirdre Priest   HPI: [name] Williams is a 62 y.o. 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 He was found to be uroseptic due to Klebsiella pneumoniae bacteremia and mentation has improved with IVF and IV ceftriaxone. PT evaluation done yesterday revealing functional deficits and CIR recommended for follow up therapy.   Review of Systems  Constitutional: Negative for fever.  HENT: Negative for hearing loss.   Eyes: Negative for blurred vision.  Respiratory: Negative for cough.   Cardiovascular: Negative for chest pain.  Genitourinary: Negative for dysuria.  Musculoskeletal: Negative for myalgias.  Skin: Negative for rash.  Neurological: Positive for focal weakness.  Psychiatric/Behavioral: Negative for depression.      Past Medical History:  Diagnosis Date  . Diabetes mellitus   . History of stroke   . Hypertension     History reviewed. No pertinent surgical history.    Family History  Problem Relation Age of Onset  . Hypertension Mother   . Heart attack Mother        deceased from MI in 65s  . Hypertension Father   . Coronary artery disease Maternal Aunt   . Diabetes Maternal Aunt   . Coronary artery disease Maternal Uncle   . Diabetes Maternal Uncle   . Diabetes Paternal Aunt   . Coronary artery disease Paternal Aunt   . Diabetes Paternal Uncle   . Coronary artery disease Paternal Uncle   . Heart attack Sister        deceased from MI in 62s    Social History:   Lives with sister and independent PTA. reports that he has been smoking cigarettes.  He has a 10.00 pack-year smoking history. he has never used smokeless tobacco. He reports that he uses drugs. He reports that he does not drink alcohol.     Allergies: No Known Allergies    Medications Prior to Admission  Medication Sig Dispense Refill  . amLODipine (NORVASC) 5 MG tablet Take 5 mg by mouth daily.    . cholecalciferol (VITAMIN D) 1000 units tablet Take 1,000 Units by mouth daily.    . clopidogrel (PLAVIX) 75 MG tablet Take 1 tablet (75 mg total) by mouth daily. 30 tablet 3  . donepezil (ARICEPT) 10 MG tablet Take 10 mg by mouth at bedtime.    Marland Kitchen lisinopril-hydrochlorothiazide (PRINZIDE,ZESTORETIC) 20-12.5 MG tablet Take 1 tablet by mouth daily.    . metFORMIN (GLUCOPHAGE) 1000 MG tablet TAKE 0.5 TABLETS (500 MG TOTAL) BY MOUTH 2 (TWO) TIMES DAILY WITH A MEAL. (Patient taking differently: Take 500 mg by mouth 2 (two) times daily with a meal. ) 60 tablet 3  . paliperidone (INVEGA SUSTENNA) 156 MG/ML SUSP injection Inject 156 mg into the muscle every 28 (twenty-eight) days.    Marland Kitchen aspirin 81 MG EC tablet TAKE 1 TABLET (81 MG TOTAL) BY MOUTH DAILY. (Patient not taking: Reported on 10/04/2017) 30 tablet 11  . aspirin EC 81 MG tablet Take 1 tablet (81 mg total) by mouth daily. (Patient not taking: Reported on 10/04/2017) 30 tablet 2  . atorvastatin (LIPITOR) 40 MG tablet Take 1 tablet (40 mg total) by mouth daily at 6 PM. (Patient not taking: Reported on 10/04/2017) 30 tablet 3  . lisinopril (PRINIVIL,ZESTRIL) 10 MG tablet Take 1 tablet (10  mg total) by mouth daily. (Patient not taking: Reported on 10/04/2017) 30 tablet 3    Home: Home Living Family/patient expects to be discharged to:: Private residence Living Arrangements: Other relatives(sister) Available Help at Discharge: Family Type of Home: House Home Layout: One level Additional Comments: reports sister is retired  Functional History:   Functional Status:  Mobility: Bed Mobility Overal bed mobility: Needs Assistance Bed Mobility: Rolling, Sidelying to Sit, Sit to Supine Rolling: Max assist Sidelying to sit: HOB elevated, Total assist Sit to supine: Total  assist General bed mobility comments: assist for all aspects of activity, to roll, reach for rail, to bring legs off bed and to lift trunk upright; to supine assist for legs and trunk Transfers Overall transfer level: Needs assistance Transfers: Sit to/from Stand Sit to Stand: Max assist General transfer comment: lifting and lowering assist, unable to step to [facility] in standing despite assist with weight shift      ADL:    Cognition: Cognition Overall Cognitive Status: Difficult to assess Orientation Level: Oriented X4 Cognition Arousal/Alertness: Lethargic Behavior During Therapy: Flat affect Overall Cognitive Status: Difficult to assess Difficult to assess due to: Level of arousal, Impaired communication(dysarthric )  Blood pressure (!) 141/63, pulse (!) 56, temperature 98.4 F (36.9 C), temperature source Oral, resp. rate 16, height 5\' 9"  (1.753 m), weight 68 kg (150 lb), SpO2 98 %. Physical Exam  Constitutional: He is oriented to person, place, and time. He appears well-developed. No distress.  Eyes: Pupils are equal, round, and reactive to light.  Neck: Normal range of motion.  Cardiovascular: Normal rate.  Respiratory: No respiratory distress.  GI: He exhibits no distension.  Neurological: He is alert and oriented to person, place, and time.  Pt with reasonable insight and awareness. Motor 2+ to 3/5 bilateral UE. LE 1/5 HF, KE and 2-ADF/PF. Senses pain in all 4's. DTR's 1+. Gaze sl dysconjugate   Skin: He is not diaphoretic.  Psychiatric: He has a normal mood and affect. His behavior is normal.    Results for orders placed or performed during the hospital encounter of 10/04/17 (from the past 24 hour(s))  Troponin I     Status: None   Collection Time: 10/05/17  9:17 AM  Result Value Ref Range   Troponin I <0.03 <0.03 ng/mL  Glucose, capillary     Status: Abnormal   Collection Time: 10/05/17 11:23 AM  Result Value Ref Range   Glucose-Capillary 135 (H) 65 - 99 mg/dL   Respiratory Panel by PCR     Status: None   Collection Time: 10/05/17  3:50 PM  Result Value Ref Range   Adenovirus NOT DETECTED NOT DETECTED   Coronavirus 229E NOT DETECTED NOT DETECTED   Coronavirus HKU1 NOT DETECTED NOT DETECTED   Coronavirus NL63 NOT DETECTED NOT DETECTED   Coronavirus OC43 NOT DETECTED NOT DETECTED   Metapneumovirus NOT DETECTED NOT DETECTED   Rhinovirus / Enterovirus NOT DETECTED NOT DETECTED   Influenza A NOT DETECTED NOT DETECTED   Influenza B NOT DETECTED NOT DETECTED   Parainfluenza Virus 1 NOT DETECTED NOT DETECTED   Parainfluenza Virus 2 NOT DETECTED NOT DETECTED   Parainfluenza Virus 3 NOT DETECTED NOT DETECTED   Parainfluenza Virus 4 NOT DETECTED NOT DETECTED   Respiratory Syncytial Virus NOT DETECTED NOT DETECTED   Bordetella pertussis NOT DETECTED NOT DETECTED   Chlamydophila pneumoniae NOT DETECTED NOT DETECTED   Mycoplasma pneumoniae NOT DETECTED NOT DETECTED  Glucose, capillary     Status: Abnormal  Collection Time: 10/05/17  4:31 PM  Result Value Ref Range   Glucose-Capillary 151 (H) 65 - 99 mg/dL  Glucose, capillary     Status: Abnormal   Collection Time: 10/05/17  9:22 PM  Result Value Ref Range   Glucose-Capillary 128 (H) 65 - 99 mg/dL   Comment 1 Notify RN    Comment 2 Document in Chart   Basic metabolic panel     Status: Abnormal   Collection Time: 10/06/17  4:09 AM  Result Value Ref Range   Sodium 137 135 - 145 mmol/L   Potassium 3.9 3.5 - 5.1 mmol/L   Chloride 104 101 - 111 mmol/L   CO2 24 22 - 32 mmol/L   Glucose, Bld 98 65 - 99 mg/dL   BUN 15 6 - 20 mg/dL   Creatinine, Ser 1.61 0.61 - 1.24 mg/dL   Calcium 8.4 (L) 8.9 - 10.3 mg/dL   GFR calc non Af Amer >60 >60 mL/min   GFR calc Af Amer >60 >60 mL/min   Anion gap 9 5 - 15  CBC     Status: Abnormal   Collection Time: 10/06/17  4:09 AM  Result Value Ref Range   WBC 12.4 (H) 4.0 - 10.5 K/uL   RBC 3.89 (L) 4.22 - 5.81 MIL/uL   Hemoglobin 10.5 (L) 13.0 - 17.0 g/dL   HCT 09.6  (L) 04.5 - 52.0 %   MCV 86.4 78.0 - 100.0 fL   MCH 27.0 26.0 - 34.0 pg   MCHC 31.3 30.0 - 36.0 g/dL   RDW 40.9 81.1 - 91.4 %   Platelets 115 (L) 150 - 400 K/uL  Glucose, capillary     Status: None   Collection Time: 10/06/17  6:05 AM  Result Value Ref Range   Glucose-Capillary 95 65 - 99 mg/dL   Comment 1 Notify RN    Comment 2 Document in Chart    Dg Chest 2 View  Result Date: 10/04/2017 CLINICAL DATA:  62 year old male with dizziness, slight dyspnea and chest pain. Recent fall. EXAM: CHEST  2 VIEW COMPARISON:  05/06/2016 FINDINGS: The heart size and mediastinal contours are within normal limits. Lung volumes are slightly low relative to previous exam. This may explain the crowding of interstitial lung markings probable atelectasis at the left lung base. The visualized skeletal structures are unremarkable. IMPRESSION: No active cardiopulmonary disease. Slightly low lung volumes with left basilar atelectasis. Electronically Signed   By: Tollie Eth M.D.   On: 10/04/2017 14:11   Ct Head Wo Contrast  Result Date: 10/04/2017 CLINICAL DATA:  Dizziness and fall. EXAM: CT HEAD WITHOUT CONTRAST CT CERVICAL SPINE WITHOUT CONTRAST TECHNIQUE: Multidetector CT imaging of the head and cervical spine was performed following the standard protocol without intravenous contrast. Multiplanar CT image reconstructions of the cervical spine were also generated. COMPARISON:  CT and MRI head dated June 23, 2014. FINDINGS: CT HEAD FINDINGS Brain: No evidence of acute infarction, hemorrhage, hydrocephalus, extra-axial collection or mass lesion/mass effect. Unchanged right cerebellar infarct. Unchanged lacunar infarcts in the bilateral basal ganglia and thalami. Stable mild cerebral atrophy and chronic microvascular ischemic changes. The brainstem is mildly atrophic. Vascular: Atherosclerotic vascular calcification of the carotid siphons. No hyperdense vessel. Skull: Normal. Negative for fracture or focal lesion.  Sinuses/Orbits: No acute finding. Small mucous retention cysts in the bilateral maxillary sinuses. Other: None. CT CERVICAL SPINE FINDINGS Alignment: Trace retrolisthesis at C4-C5. No traumatic malalignment. Skull base and vertebrae: No acute fracture. No primary bone lesion or focal  pathologic process. Auto fusion of the C2-C3 vertebral bodies and facets. Soft tissues and spinal canal: No prevertebral fluid or swelling. No visible canal hematoma. Disc levels: Mild degenerative disc disease and moderate left uncovertebral hypertrophy at C4-C5, moderately narrowing the left neural foramen. Upper chest: Mild centrilobular emphysema in the lung apices. Otherwise negative. Other: None. IMPRESSION: 1. No acute intracranial abnormality. Several chronic infarcts, as described above. 2.  No acute cervical spine fracture. Electronically Signed   By: Obie Dredge M.D.   On: 10/04/2017 13:56   Ct Cervical Spine Wo Contrast  Result Date: 10/04/2017 CLINICAL DATA:  Dizziness and fall. EXAM: CT HEAD WITHOUT CONTRAST CT CERVICAL SPINE WITHOUT CONTRAST TECHNIQUE: Multidetector CT imaging of the head and cervical spine was performed following the standard protocol without intravenous contrast. Multiplanar CT image reconstructions of the cervical spine were also generated. COMPARISON:  CT and MRI head dated June 23, 2014. FINDINGS: CT HEAD FINDINGS Brain: No evidence of acute infarction, hemorrhage, hydrocephalus, extra-axial collection or mass lesion/mass effect. Unchanged right cerebellar infarct. Unchanged lacunar infarcts in the bilateral basal ganglia and thalami. Stable mild cerebral atrophy and chronic microvascular ischemic changes. The brainstem is mildly atrophic. Vascular: Atherosclerotic vascular calcification of the carotid siphons. No hyperdense vessel. Skull: Normal. Negative for fracture or focal lesion. Sinuses/Orbits: No acute finding. Small mucous retention cysts in the bilateral maxillary sinuses. Other:  None. CT CERVICAL SPINE FINDINGS Alignment: Trace retrolisthesis at C4-C5. No traumatic malalignment. Skull base and vertebrae: No acute fracture. No primary bone lesion or focal pathologic process. Auto fusion of the C2-C3 vertebral bodies and facets. Soft tissues and spinal canal: No prevertebral fluid or swelling. No visible canal hematoma. Disc levels: Mild degenerative disc disease and moderate left uncovertebral hypertrophy at C4-C5, moderately narrowing the left neural foramen. Upper chest: Mild centrilobular emphysema in the lung apices. Otherwise negative. Other: None. IMPRESSION: 1. No acute intracranial abnormality. Several chronic infarcts, as described above. 2.  No acute cervical spine fracture. Electronically Signed   By: Obie Dredge M.D.   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 Does the need for close, 24 hr/day medical supervision in concert with the patient's rehab needs make it unreasonable for this patient to be served in a less intensive setting? Yes 2. Co-Morbidities requiring supervision/potential complications: schizophrenia,  3. Due to bladder management, bowel management, safety, skin/wound care, disease management, medication administration, pain management and patient education, does the patient require 24 hr/day rehab nursing? Yes 4. Does the patient require coordinated care of a physician, rehab nurse, PT (1-2 hrs/day, 5 days/week), OT (1-2 hrs/day, 5 days/week) and SLP (1-2 hrs/day, 5 days/week) to address physical and functional deficits in the context of the above medical diagnosis(es)? Yes Addressing deficits in the following areas: balance, endurance, locomotion, strength, transferring, bowel/bladder control, bathing, dressing, feeding, grooming, toileting, cognition and psychosocial support 5. Can the patient actively  participate in an intensive therapy program of at least 3 hrs of therapy per day at least 5 days per week? Yes 6. The potential for patient to make measurable gains while on inpatient rehab is excellent 7. Anticipated functional outcomes upon discharge from inpatient rehab are supervision and min assist  with PT, supervision and min assist with OT, supervision and min assist with SLP. 8. Estimated rehab length of stay to reach the above functional goals is: potentially 12-20 days 9. Anticipated D/C setting: Home 10.  Anticipated post D/C treatments: HH therapy and Outpatient therapy 11. Overall Rehab/Functional Prognosis: excellent  RECOMMENDATIONS: This patient's condition is appropriate for continued rehabilitative care in the following setting: CIR Patient has agreed to participate in recommended program. Yes Note that insurance prior authorization may be required for reimbursement for recommended care.  Comment: Rehab Admissions Coordinator to follow up.  Thanks,  Ranelle OysterZachary T. Eiliana Drone, MD, [place] DomFAAPMR    Jacquelynn CreePamela S Love, PA-C 10/06/2017

## 2017-10-06 NOTE — Progress Notes (Signed)
OT Evaluation  PTA, pt lived at home with his sister who pt states assisted him with mobility and ADL. Pt appears to demonstrate a functional decline and would benefit from rehab at CIR to facilitate safe DC home with assistance of sister. Will follow acutely to facilitate DC to next venue of care and maximize pt's level of independence.    10/06/17 1400  OT Visit Information  Last OT Received On 10/06/17  Assistance Needed +2  History of Present Illness Cleotilde NeerJames Prestia is a 62 y.o. male presenting with altered mental status and syncope. PMH is significant for HTN, T2DM, CVA in 2011, HLD, and schizophrenia.   Precautions  Precautions Fall  Home Living  Family/patient expects to be discharged to: Private residence  Living Arrangements Other relatives (sister)  Available Help at Discharge Family  Type of Home House  Home Access Stairs to enter  Entrance Stairs-Number of Steps 5  Entrance Stairs-Rails Can reach both  Home Layout One level  Bathroom Nurse, children'shower/Tub Tub/shower unit  Bathroom Toilet Standard  Bathroom Accessibility Yes  How Accessible Accessible via Customer service managerwalker  Home Equipment Wheelchair - manual  Additional Comments reports sister is retired  Prior Function  Level of Field seismologistndependence Needs assistance  Gait / Transfers Assistance Needed Pt states he uses a w/c  ADL's / Homemaking Assistance Needed Staets sister helps him with bathing and dressing at baseline; (sponge bathes)  Comments likes to rest and watch TV  Communication  Communication Expressive difficulties (dysarthria)  Pain Assessment  Pain Assessment Faces  Faces Pain Scale 0  Cognition  Arousal/Alertness Awake/alert  Behavior During Therapy WFL for tasks assessed/performed  Overall Cognitive Status No family/caregiver present to determine baseline cognitive functioning  General Comments appears more alert than earlier PT session  Difficult to assess due to (dysarthric )  Upper Extremity Assessment  Upper Extremity  Assessment RUE deficits/detail  RUE Deficits / Details actively attmepts to use RUE, but weak; staes weak from prior CVA; attempts to use as functional assist  LUE Deficits / Details LUE generalzied weakness  Lower Extremity Assessment  Lower Extremity Assessment Defer to PT evaluation  Cervical / Trunk Assessment  Cervical / Trunk Assessment Other exceptions  Cervical / Trunk Exceptions L bias;posterior lean  ADL  Overall ADL's  Needs assistance/impaired  Eating/Feeding Minimal assistance  Grooming Moderate assistance  Upper Body Bathing Moderate assistance;Sitting  Lower Body Bathing Maximal assistance;Sit to/from stand  Upper Body Dressing  Maximal assistance;Sitting  Lower Body Dressing Maximal assistance;Sit to/from Scientist, research (life sciences)stand  Toilet Transfer Maximal assistance;Stand-pivot;Cueing for safety;Cueing for sequencing  Toileting- Clothing Manipulation and Hygiene Maximal assistance  Toileting - Clothing Manipulation Details (indicate cue type and reason) incontinenet of urine  Functional mobility during ADLs Maximal assistance (stand pivot)  Vision- Assessment  Additional Comments will further assess  Praxis  Praxis-Other Comments slow processing  Bed Mobility  Overal bed mobility Needs Assistance  Bed Mobility Supine to Sit  Rolling Max assist  Sidelying to sit HOB elevated;Total assist  Sit to supine HOB elevated;Mod assist;+2 for safety/equipment  General bed mobility comments guided legs off bed, assist to lift trunk  Transfers  Overall transfer level Needs assistance  Equipment used 1 person hand held assist  Transfers Stand Pivot Transfers;Sit to/from Stand  Sit to Stand Mod assist  Stand pivot transfers Max assist  Balance  Overall balance assessment Needs assistance  Sitting-balance support Feet supported  Sitting balance-Leahy Scale Poor  Sitting balance - Comments posterior and L bias  Postural control Left lateral lean  Standing balance-Leahy Scale Poor  OT - End of  Session  Equipment Utilized During Treatment Gait belt  Activity Tolerance Patient tolerated treatment well  Patient left in chair;with call bell/phone within reach;with chair alarm set;with nursing/sitter in room  Nurse Communication Mobility status  OT Assessment  OT Recommendation/Assessment Patient needs continued OT Services  OT Visit Diagnosis Unsteadiness on feet (R26.81);Other abnormalities of gait and mobility (R26.89);Muscle weakness (generalized) (M62.81);Other symptoms and signs involving cognitive function;Hemiplegia and hemiparesis  Hemiplegia - Right/Left Right  Hemiplegia - caused by Unspecified  OT Problem List Decreased strength;Decreased activity tolerance;Impaired balance (sitting and/or standing);Decreased coordination;Decreased cognition;Decreased safety awareness;Decreased knowledge of use of DME or AE;Impaired UE functional use;Impaired tone  OT Plan  OT Frequency (ACUTE ONLY) Min 2X/week  OT Treatment/Interventions (ACUTE ONLY) Self-care/ADL training;Therapeutic exercise;Neuromuscular education;Energy conservation;DME and/or AE instruction;Therapeutic activities;Cognitive remediation/compensation;Visual/perceptual remediation/compensation;Patient/family education;Balance training  AM-PAC OT "6 Clicks" Daily Activity Outcome Measure  Help from another person eating meals? 3  Help from another person taking care of personal grooming? 2  Help from another person toileting, which includes using toliet, bedpan, or urinal? 2  Help from another person bathing (including washing, rinsing, drying)? 2  Help from another person to put on and taking off regular upper body clothing? 2  Help from another person to put on and taking off regular lower body clothing? 2  6 Click Score 13  ADL G Code Conversion CL  OT Recommendation  Recommendations for Other Services Rehab consult  Follow Up Recommendations CIR;Supervision/Assistance - 24 hour  OT Equipment None recommended by OT   Individuals Consulted  Consulted and Agree with Results and Recommendations Patient  Acute Rehab OT Goals  Patient Stated Goal to go home  OT Goal Formulation With patient  Time For Goal Achievement 10/20/17  Potential to Achieve Goals Good  OT Time Calculation  OT Start Time (ACUTE ONLY) 1343  OT Stop Time (ACUTE ONLY) 1405  OT Time Calculation (min) 22 min  OT General Charges  $OT Visit 1 Visit  OT Evaluation  $OT Eval Moderate Complexity 1 Mod  Written Expression  Dominant Hand Right (residual weakness from prior CVA)  [facility], OT/L  [phone] 10/06/2017

## 2017-10-06 NOTE — Progress Notes (Signed)
Family Medicine Teaching Service Daily Progress Note Intern Pager: [phone]  Patient name: [name] Williams Medical record number: [id number] Date of birth: 1956/03/12 Age: 62 y.o. Gender: male  Primary Care Provider: Annie SableKohler, Patricia Lynn, NP Consultants: cardiology Code Status: Partial (DNR, but DO intubate)  Pt Overview and Major Events to Date:  10/04/2017: Admitted, started on vanc/zosyn 10/05/2017: transition to CTX with +Bcx and UCx w klebsiella  Assessment and Plan: [name] Williams a 62 y.o.malepresenting with altered mental status and syncope. PMH is significant forHTN, T2DM, CVA in 2011, HLD, and schizophrenia.   Klebsiella bacteremia 2/2 to UTI:  Urine cx with >100k CFUs klebsiella sensitive to cephalosporins. He is more alert this morning following transition to CTX yesterday although he has had persistent bradycardia to the 50s with stable BPs. WBC improved to 12.4 today from 14.4 yesterday and pt remains afebrile x24hrs. However, patient still has significant weakness on exam and is unable to ambulate (which he is able to do at baseline). Plan to continue treating infection and have patient work with PT to see if functional status improves.  - transition to cephalexin 500mg  Q6H (2/21- ), s/p CTX (2/20); s/p vanc/zosyn (2/19-2/20); plan for 7d total course - will check PVR to assess for urinary obstruction/BPH; can consider tamsulosin if elevated - d/c mIVF given tolerating PO and BP stable - hold home HTN meds in setting of low-normal BP - ondansetron 4mg  Q6H prn nausea - acetaminophen 650mg  Q6H prn pain - continue to hold home donepezil as it can cause sedation - PT/OT consult: will evaluate for CIR  Elevated troponin  Hx HFmrEF: Initial trop x2 negative, 3rd trop 0.54 > 0.03 although EKG unchanged from admission. Echo today improved, EF 50-55% (previously 45-50% 06/2014), mild LVH, nl diastolic function - cardiology consulted, appreciate reccs  HTN: Blood pressure at  goal overnight. On amlodipine 5mg  and lisinopril-HCTZ 20-12.5 at home - Hold given BP at goal; plan to restart tomorrow if pressures stable  Hx CVA: Pt with hx CVA in 2011; he was placed on clopidigrel in 2015 by neurology for medical management for ICA stenosis - d/c clopidigrel, start ASA 81mg  given distant history CVA  T2DM:A1C 7.7 on admission; on metformin 1000mg  BID at home. BG 90s-150s since yesterday. - hold metformin in setting of diarrhea - moderate SSI, CBGs  Schizophrenia:Stable.Followed by [facility]; receives paliperidone once monthly (last on 2/11). Sister reports he frequently talks to himself and this is unchanged.  - CTM  HLD: reported by patient - f/u lipid panel  FEN/GI: Carb consistent diet, miralax pen Prophylaxis:heparin for PPX, continue home clopidigrel  Disposition: continue inpatient management; likely discharge to CIR or other rehab tomorrow  Subjective:  Patient denies pain this morning. He does note some neck soreness but denies photophobia, headache, chest pain, and SOB.   Discussed code status given more alert today than was on admission - would NOT like chest compressions or shocks to restart or regulate heart rhythm, but would like to be intubated if having difficulty breathing.   Objective: Temp:  [98.4 F (36.9 C)-100.1 F (37.8 C)] 98.4 F (36.9 C) (02/21 0803) Pulse Rate:  [18-66] 56 (02/21 0427) Resp:  [16-20] 16 (02/21 0803) BP: (116-141)/(51-63) 141/63 (02/21 0803) SpO2:  [95 %-98 %] 98 % (02/21 0803) Physical Exam: General: alert, oriented to person, place, year, states month initially as July but then self-corrected to February.  HEENT: pinpoint pupils Cardiovascular: RRR, no m/r/g Respiratory: CTA b/l, normal WOB on RA Abdomen: soft, nontender, nondistended, normoactive bowel  sounds  Extremities: WWP, no edema. Weak grip strength bilaterally. 4/5 bilateral UE strength, worse on R side. 2+/5 strength in bilateral LE (unable to  lift off bed), although question effort. Pain with neck flexion but not with extension or rotation.   Laboratory: Recent Labs  Lab 10/04/17 1314 10/04/17 1728 10/05/17 0416 10/06/17 0409  WBC 12.5*  --  14.4* 12.4*  HGB 11.4*  --  11.0* 10.5*  HCT 36.2* 34.0* 34.0* 33.6*  PLT 194  --  120* 115*   Recent Labs  Lab 10/04/17 1518 10/04/17 1728 10/05/17 0416 10/06/17 0409  NA 140  --  139 137  K 3.8  --  4.0 3.9  CL 104  --  104 104  CO2 23  --  22 24  BUN 16  --  15 15  CREATININE 1.34* 1.38* 1.41* 1.16  CALCIUM 9.2  --  8.7* 8.4*  PROT 6.3*  --   --   --   BILITOT 0.8  --   --   --   ALKPHOS 48  --   --   --   ALT 18  --   --   --   AST 29  --   --   --   GLUCOSE 169*  --  134* 98    Imaging/Diagnostic Tests: none  Desiree Hane, Medical Student 10/06/2017, 9:35 AM MS4, [facility] FPTS Intern pager: [phone], text pages welcome  FPTS Upper-Level Resident Addendum  I have independently interviewed and examined the patient. I have discussed the above with the original author and agree with their documentation.  Please see also any attending notes.   Leland Her, DO PGY-2, [facility] 10/06/2017 2:40 PM  FPTS Service pager: [phone] (text pages welcome through AMION)

## 2017-10-06 NOTE — Progress Notes (Signed)
  Echocardiogram 2D Echocardiogram has been performed.  Janalyn HarderWest, Marlene Beidler R 10/06/2017, 11:29 AM

## 2017-10-06 NOTE — Progress Notes (Addendum)
Manual BP obtained at 3AM was 132/58. Notified Fam. Med. Teach. Svcs. per order for diastolic BP <60. Per MD, monitor patient's BP as his readings have been running in this range.

## 2017-10-06 NOTE — Progress Notes (Signed)
Paged Fam. Med. Teaching Svc. R/T BP 116/51 via dinamap, per order to notify MD of diastolic BP <60. Received instructions to obtain manual BP, which was 118/52 with half NS running @ 100 mL/hr. Paged this info to MD. Received instructions to re-check at 0300.

## 2017-10-06 NOTE — Progress Notes (Signed)
Paged Fam. Med. Teaching Svc. R/T BP 120/59, per order to notify MD with diastolic BP <60. No response received.

## 2017-10-06 NOTE — Discharge Summary (Addendum)
Family Medicine Teaching [facility] Discharge Summary  Patient name: [name] Williams Medical record number: [id number] Date of birth: 08/04/1956 Age: 62 y.o. Gender: male Date of Admission: 10/04/2017  Date of Discharge: 10/10/17 Admitting Physician: Carney LivingMarshall L Chambliss, MD  Primary Care Provider: Annie SableKohler, Patricia Lynn, NP Consultants: cardiology, PM&R  Indication for Hospitalization: altered mental status due to sepsis  Discharge Diagnoses/Problem List:  [place]  History of CVA Diabetes Mellitus HTN HLD Schizophrenia Klebsiella Pneumonia bacteremia and UTI  Disposition: SNF  Discharge Condition: stable  Discharge Exam:  General: Awake, sitting up in bed, in NAD Cardiovascular: RRR, no murmurs, chest wall nontender to palpation Respiratory: CTAB, normal WOB Extremities: warm and well-perfused no edema Neuro: Alert, oriented x 3, CN 2-12 intact, pupils miotic, 3/5 muscle strength in the RUE and RLE, 4/5 muscle strength in the LUE and LLE  Brief Hospital Course:  [name] Williams is a 62 yo M with medical history significant for HTN, type 2 diabetes, schizophrenia, and CVA in 2011 who presented to the ED with syncope and altered mental status concerning for sepsis. On exam, he was diaphoretic, somnolent, oriented x2, had global weakness on exam, and was febrile and tachycardic but normotensive. Admission workup was remarkable for mild leukocytosis, elevated lactate, elevated creatinine (1.34), and CXR and CT head were without acute findings. He was started on broad spectrum antibiotics initially and treated with IVF with which he clinically improved. His urine culture and blood cultures grew Klebsiella shortly after admission and his coverage was narrowed to CTX and then to PO cephalexin once culture sensitivities returned. His mental status improved throughout his admission, however, he continued to exhibit significant weakness and was unable to ambulate (he was ambulatory just prior to  admission). MRI was obtained on 2/22 which showed two small foci of ischemia in posterior R lentiform nucleus and L corona radiata in addition to chronic ischemic microangiopathy. Neurology was consulted and recommended TEE, which was performed 2/25 and was negative for vegetations. They also recommended 30 day Holter monitor to rule out A-fib. Cardiology was consulted and will get this set up as an outpatient.  Issues for Follow Up:  1. Patient had Klebsiella UTI and bacteremia. He was discharged with Keflex for a total 10 day course. He should take this 4 times per day and finish the course on 2/28. 2. Aspirin was changed from 81mg  daily to 325mg  daily. He should also take Plavix 75mg  daily per Neurology recommendations 3. Lipitor dose was increased from 40mg  daily to 80mg  daily 4. Lisinopril dose was increased from 10mg  daily to 20mg  daily. Recommend rechecking BMP in 1 week. 5. Norvasc was held during his hospitalization, but was restarted on discharge. Please follow-up his blood pressures. 6. Patient was on Metformin prior to admission, but was on Novolog via sliding scale while hospitalized. Please follow-up his blood sugars and consider adding another agent or starting Lantus if appropriate.  Significant Procedures: TEE 2/25: negative for vegetations.  Significant Labs and Imaging:  Recent Labs  Lab 10/08/17 0235 10/09/17 0519 10/10/17 0338  WBC 10.1 12.3* 12.1*  HGB 10.9* 11.4* 10.9*  HCT 34.3* 35.5* 33.7*  PLT 170 203 213   Recent Labs  Lab 10/04/17 1518 10/04/17 1728  10/06/17 0409 10/07/17 0715 10/08/17 0235 10/09/17 0519 10/10/17 1003  NA 140  --    < > 137 137 137 138 135  K 3.8  --    < > 3.9 3.8 4.1 4.2 4.1  CL 104  --    < >  104 103 104 104 103  CO2 23  --    < > 24 21* 22 24 22   GLUCOSE 169*  --    < > 98 127* 216* 126* 141*  BUN 16  --    < > 15 11 10 9 11   CREATININE 1.34* 1.38*   < > 1.16 1.09 1.11 1.03 0.92  CALCIUM 9.2  --    < > 8.4* 8.8* 8.7* 9.0 8.8*   MG  --  1.3*  --   --   --   --   --  1.4*  PHOS  --  2.4*  --   --   --   --   --  3.1  ALKPHOS 48  --   --   --   --   --   --   --   AST 29  --   --   --   --   --   --   --   ALT 18  --   --   --   --   --   --   --   ALBUMIN 3.3*  --   --   --   --   --   --   --    < > = values in this interval not displayed.   Dg Chest 2 View  Result Date: 10/04/2017 CLINICAL DATA:  62 year old male with dizziness, slight dyspnea and chest pain. Recent fall. EXAM: CHEST  2 VIEW COMPARISON:  05/06/2016 FINDINGS: The heart size and mediastinal contours are within normal limits. Lung volumes are slightly low relative to previous exam. This may explain the crowding of interstitial lung markings probable atelectasis at the left lung base. The visualized skeletal structures are unremarkable. IMPRESSION: No active cardiopulmonary disease. Slightly low lung volumes with left basilar atelectasis. Electronically Signed   By: Tollie Eth M.D.   On: 10/04/2017 14:11   Ct Head Wo Contrast  Result Date: 10/04/2017 CLINICAL DATA:  Dizziness and fall. EXAM: CT HEAD WITHOUT CONTRAST CT CERVICAL SPINE WITHOUT CONTRAST TECHNIQUE: Multidetector CT imaging of the head and cervical spine was performed following the standard protocol without intravenous contrast. Multiplanar CT image reconstructions of the cervical spine were also generated. COMPARISON:  CT and MRI head dated June 23, 2014. FINDINGS: CT HEAD FINDINGS Brain: No evidence of acute infarction, hemorrhage, hydrocephalus, extra-axial collection or mass lesion/mass effect. Unchanged right cerebellar infarct. Unchanged lacunar infarcts in the bilateral basal ganglia and thalami. Stable mild cerebral atrophy and chronic microvascular ischemic changes. The brainstem is mildly atrophic. Vascular: Atherosclerotic vascular calcification of the carotid siphons. No hyperdense vessel. Skull: Normal. Negative for fracture or focal lesion. Sinuses/Orbits: No acute finding. Small  mucous retention cysts in the bilateral maxillary sinuses. Other: None. CT CERVICAL SPINE FINDINGS Alignment: Trace retrolisthesis at C4-C5. No traumatic malalignment. Skull base and vertebrae: No acute fracture. No primary bone lesion or focal pathologic process. Auto fusion of the C2-C3 vertebral bodies and facets. Soft tissues and spinal canal: No prevertebral fluid or swelling. No visible canal hematoma. Disc levels: Mild degenerative disc disease and moderate left uncovertebral hypertrophy at C4-C5, moderately narrowing the left neural foramen. Upper chest: Mild centrilobular emphysema in the lung apices. Otherwise negative. Other: None. IMPRESSION: 1. No acute intracranial abnormality. Several chronic infarcts, as described above. 2.  No acute cervical spine fracture. Electronically Signed   By: Obie Dredge M.D.   On: 10/04/2017 13:56   Ct Cervical Spine Wo Contrast  Result Date: 10/04/2017 CLINICAL DATA:  Dizziness and fall. EXAM: CT HEAD WITHOUT CONTRAST CT CERVICAL SPINE WITHOUT CONTRAST TECHNIQUE: Multidetector CT imaging of the head and cervical spine was performed following the standard protocol without intravenous contrast. Multiplanar CT image reconstructions of the cervical spine were also generated. COMPARISON:  CT and MRI head dated June 23, 2014. FINDINGS: CT HEAD FINDINGS Brain: No evidence of acute infarction, hemorrhage, hydrocephalus, extra-axial collection or mass lesion/mass effect. Unchanged right cerebellar infarct. Unchanged lacunar infarcts in the bilateral basal ganglia and thalami. Stable mild cerebral atrophy and chronic microvascular ischemic changes. The brainstem is mildly atrophic. Vascular: Atherosclerotic vascular calcification of the carotid siphons. No hyperdense vessel. Skull: Normal. Negative for fracture or focal lesion. Sinuses/Orbits: No acute finding. Small mucous retention cysts in the bilateral maxillary sinuses. Other: None. CT CERVICAL SPINE FINDINGS  Alignment: Trace retrolisthesis at C4-C5. No traumatic malalignment. Skull base and vertebrae: No acute fracture. No primary bone lesion or focal pathologic process. Auto fusion of the C2-C3 vertebral bodies and facets. Soft tissues and spinal canal: No prevertebral fluid or swelling. No visible canal hematoma. Disc levels: Mild degenerative disc disease and moderate left uncovertebral hypertrophy at C4-C5, moderately narrowing the left neural foramen. Upper chest: Mild centrilobular emphysema in the lung apices. Otherwise negative. Other: None. IMPRESSION: 1. No acute intracranial abnormality. Several chronic infarcts, as described above. 2.  No acute cervical spine fracture. Electronically Signed   By: Obie Dredge M.D.   On: 10/04/2017 13:56    Results/Tests Pending at Time of Discharge: none  Discharge Medications:  Allergies as of 10/10/2017   No Known Allergies     Medication List    STOP taking these medications   lisinopril-hydrochlorothiazide 20-12.5 MG tablet Commonly known as:  PRINZIDE,ZESTORETIC     TAKE these medications   amLODipine 5 MG tablet Commonly known as:  NORVASC Take 5 mg by mouth daily.   aspirin 325 MG EC tablet Take 1 tablet (325 mg total) by mouth daily. Start taking on:  10/11/2017 What changed:    medication strength  how much to take  Another medication with the same name was removed. Continue taking this medication, and follow the directions you see here.   atorvastatin 80 MG tablet Commonly known as:  LIPITOR Take 1 tablet (80 mg total) by mouth daily at 6 PM. What changed:    medication strength  how much to take   cephALEXin 500 MG capsule Commonly known as:  KEFLEX Take 1 capsule (500 mg total) by mouth 4 (four) times daily for 4 days.   cholecalciferol 1000 units tablet Commonly known as:  VITAMIN D Take 1,000 Units by mouth daily.   clopidogrel 75 MG tablet Commonly known as:  PLAVIX Take 1 tablet (75 mg total) by mouth  daily.   donepezil 10 MG tablet Commonly known as:  ARICEPT Take 10 mg by mouth at bedtime.   INVEGA SUSTENNA 156 MG/ML Susp injection Generic drug:  paliperidone Inject 156 mg into the muscle every 28 (twenty-eight) days.   lisinopril 20 MG tablet Commonly known as:  PRINIVIL,ZESTRIL Take 1 tablet (20 mg total) by mouth daily. Start taking on:  10/11/2017 What changed:    medication strength  how much to take   metFORMIN 1000 MG tablet Commonly known as:  GLUCOPHAGE TAKE 0.5 TABLETS (500 MG TOTAL) BY MOUTH 2 (TWO) TIMES DAILY WITH A MEAL. What changed:    how much to take  how to take this  when to take  this  additional instructions   polyethylene glycol packet Commonly known as:  MIRALAX / GLYCOLAX Take 17 g by mouth daily. Start taking on:  10/11/2017       Discharge Instructions: Please refer to Patient Instructions section of EMR for full details.  Patient was counseled important signs and symptoms that should prompt return to medical care, changes in medications, dietary instructions, activity restrictions, and follow up appointments.   Follow-Up Appointments:  Contact information for follow-up providers    Nilda Riggs, NP. Schedule an appointment as soon as possible for a visit in 6 week(s).   Specialty:  Family Medicine Contact information: [address] [place] [address] [phone]            Contact information for after-discharge care    Destination    [facility] .   Service:  Skilled Nursing Contact information: [address] [place] [address] [phone]                  Campbell Stall, MD 10/10/2017, 3:36 PM PGY-3, [facility]

## 2017-10-06 NOTE — Progress Notes (Signed)
Physical Therapy Treatment Patient Details Name: [name] Williams MRN: [id number] DOB: 03/13/1956 Today's Date: 10/06/2017    History of Present Illness [name] Williams is a 62 y.o. male presenting with altered mental status and syncope. PMH is significant for HTN, T2DM, CVA in 2011, HLD, and schizophrenia.     PT Comments    Patient progressing this session, able to take steps with RW to pivot to chair, also, improved postural control on EOB and using extremities to assist more with balance as well as no longer drooling, but continues with some issues as cough after taking sips of water.  Recommend SLP for swallow evaluation.    Follow Up Recommendations  CIR     Equipment Recommendations  Other (comment)(TBA)    Recommendations for Other Services Speech consult     Precautions / Restrictions Precautions Precautions: Fall    Mobility  Bed Mobility Overal bed mobility: Needs Assistance Bed Mobility: Supine to Sit       Sit to supine: HOB elevated;Mod assist;+2 for safety/equipment   General bed mobility comments: guided legs off bed, assist to lift trunk  Transfers Overall transfer level: Needs assistance Equipment used: Rolling walker (2 wheeled) Transfers: Sit to/from UGI CorporationStand;Stand Pivot Transfers Sit to Stand: Mod assist;+2 physical assistance Stand pivot transfers: Mod assist;+2 physical assistance       General transfer comment: lifting assist, but pt able to extend legs and trunk with walker in standing, with walker and increased time, assist for balance, weight shift able to take steps to pivot to chair; noted R LE buckling and difficulty to move back to chair  Ambulation/Gait                 Stairs            Wheelchair Mobility    Modified Rankin (Stroke Patients Only)       Balance Overall balance assessment: Needs assistance Sitting-balance support: Feet supported Sitting balance-Leahy Scale: Poor Sitting balance - Comments: falls back  initially needing mod A for balance, once sitting a while and cues for head up balanced with min A due to L lateral lean, cues for R weight shift Postural control: Left lateral lean   Standing balance-Leahy Scale: Poor Standing balance comment: + 2 A with walker                            Cognition Arousal/Alertness: Awake/alert Behavior During Therapy: WFL for tasks assessed/performed Overall Cognitive Status: No family/caregiver present to determine baseline cognitive functioning                                        Exercises      General Comments        Pertinent Vitals/Pain Pain Assessment: Faces Faces Pain Scale: Hurts little more Pain Location: legs Pain Descriptors / Indicators: Aching Pain Intervention(s): Monitored during session;Repositioned    Home Living                      Prior Function            PT Goals (current goals can now be found in the care plan section) Progress towards PT goals: Progressing toward goals    Frequency    Min 3X/week      PT Plan Current plan remains appropriate    Co-evaluation  AM-PAC PT "6 Clicks" Daily Activity  Outcome Measure  Difficulty turning over in bed (including adjusting bedclothes, sheets and blankets)?: Unable Difficulty moving from lying on back to sitting on the side of the bed? : Unable Difficulty sitting down on and standing up from a chair with arms (e.g., wheelchair, bedside commode, etc,.)?: Unable Help needed moving to and from a bed to chair (including a wheelchair)?: A Lot Help needed walking in hospital room?: Total Help needed climbing 3-5 steps with a railing? : Total 6 Click Score: 7    End of Session Equipment Utilized During Treatment: Gait belt Activity Tolerance: Patient tolerated treatment well Patient left: with call bell/phone within reach;with chair alarm set;in chair   PT Visit Diagnosis: Muscle weakness (generalized)  (M62.81);History of falling (Z91.81);Other symptoms and signs involving the nervous system (J81.191)     Time: 4782-9562 PT Time Calculation (min) (ACUTE ONLY): 15 min  Charges:  $Therapeutic Activity: 8-22 mins                    G CodesSheran Williams,  [phone] 10/06/2017    [name] Williams 10/06/2017, 10:23 AM

## 2017-10-07 ENCOUNTER — Inpatient Hospital Stay (HOSPITAL_COMMUNITY): Payer: Medicare (Managed Care)

## 2017-10-07 DIAGNOSIS — A419 Sepsis, unspecified organism: Secondary | ICD-10-CM

## 2017-10-07 DIAGNOSIS — R42 Dizziness and giddiness: Secondary | ICD-10-CM

## 2017-10-07 DIAGNOSIS — R4182 Altered mental status, unspecified: Secondary | ICD-10-CM

## 2017-10-07 DIAGNOSIS — I639 Cerebral infarction, unspecified: Secondary | ICD-10-CM

## 2017-10-07 DIAGNOSIS — N10 Acute tubulo-interstitial nephritis: Secondary | ICD-10-CM

## 2017-10-07 LAB — LIPID PANEL
CHOL/HDL RATIO: 6.2 ratio
Cholesterol: 123 mg/dL (ref 0–200)
HDL: 20 mg/dL — ABNORMAL LOW (ref >40)
LDL Cholesterol: 79 mg/dL (ref 0–99)
Triglycerides: 119 mg/dL (ref <150)
VLDL: 24 mg/dL (ref 0–40)

## 2017-10-07 LAB — CBC
HCT: 34.7 % — ABNORMAL LOW (ref 39.0–52.0)
HEMOGLOBIN: 11.2 g/dL — AB (ref 13.0–17.0)
MCH: 27.9 pg (ref 26.0–34.0)
MCHC: 32.3 g/dL (ref 30.0–36.0)
MCV: 86.5 fL (ref 78.0–100.0)
Platelets: 135 10*3/uL — ABNORMAL LOW (ref 150–400)
RBC: 4.01 MIL/uL — AB (ref 4.22–5.81)
RDW: 14.2 % (ref 11.5–15.5)
WBC: 10.8 10*3/uL — ABNORMAL HIGH (ref 4.0–10.5)

## 2017-10-07 LAB — GLUCOSE, CAPILLARY
GLUCOSE-CAPILLARY: 132 mg/dL — AB (ref 65–99)
GLUCOSE-CAPILLARY: 229 mg/dL — AB (ref 65–99)
Glucose-Capillary: 184 mg/dL — ABNORMAL HIGH (ref 65–99)
Glucose-Capillary: 238 mg/dL — ABNORMAL HIGH (ref 65–99)

## 2017-10-07 LAB — BASIC METABOLIC PANEL
ANION GAP: 13 (ref 5–15)
BUN: 11 mg/dL (ref 6–20)
CHLORIDE: 103 mmol/L (ref 101–111)
CO2: 21 mmol/L — AB (ref 22–32)
Calcium: 8.8 mg/dL — ABNORMAL LOW (ref 8.9–10.3)
Creatinine, Ser: 1.09 mg/dL (ref 0.61–1.24)
GFR calc non Af Amer: >60 mL/min (ref >60)
Glucose, Bld: 127 mg/dL — ABNORMAL HIGH (ref 65–99)
POTASSIUM: 3.8 mmol/L (ref 3.5–5.1)
Sodium: 137 mmol/L (ref 135–145)

## 2017-10-07 LAB — CULTURE, BLOOD (ROUTINE X 2)

## 2017-10-07 MED ORDER — ATORVASTATIN CALCIUM 40 MG PO TABS: 40.0000 mg | ORAL_TABLET | Freq: Every day | ORAL | Status: DC

## 2017-10-07 MED ORDER — ATORVASTATIN CALCIUM 10 MG PO TABS
20.0000 mg | ORAL_TABLET | Freq: Every day | ORAL | Status: DC
  Administered 2017-10-07: 20 mg via ORAL
  Filled 2017-10-07: qty 2

## 2017-10-07 MED ORDER — POLYETHYLENE GLYCOL 3350 17 G PO PACK
17.0000 g | PACK | Freq: Every day | ORAL | Status: DC
  Administered 2017-10-07 – 2017-10-10 (×4): 17 g via ORAL
  Filled 2017-10-07 (×4): qty 1

## 2017-10-07 MED ORDER — LISINOPRIL 20 MG PO TABS
20.0000 mg | ORAL_TABLET | Freq: Every day | ORAL | Status: DC
Start: 2017-10-07 — End: 2017-10-10
  Administered 2017-10-07 – 2017-10-10 (×4): 20 mg via ORAL
  Filled 2017-10-07 (×4): qty 1

## 2017-10-07 MED ORDER — CLOPIDOGREL BISULFATE 75 MG PO TABS
75.0000 mg | ORAL_TABLET | Freq: Every day | ORAL | Status: DC
  Administered 2017-10-08 – 2017-10-10 (×3): 75 mg via ORAL
  Filled 2017-10-07 (×3): qty 1

## 2017-10-07 MED ORDER — ATORVASTATIN CALCIUM 80 MG PO TABS
80.0000 mg | ORAL_TABLET | Freq: Every day | ORAL | Status: DC
Start: 2017-10-08 — End: 2017-10-10
  Administered 2017-10-08 – 2017-10-09 (×2): 80 mg via ORAL
  Filled 2017-10-07 (×2): qty 1

## 2017-10-07 MED ORDER — ASPIRIN EC 325 MG PO TBEC
325.0000 mg | DELAYED_RELEASE_TABLET | Freq: Every day | ORAL | Status: DC
  Administered 2017-10-08 – 2017-10-10 (×3): 325 mg via ORAL
  Filled 2017-10-07 (×3): qty 1

## 2017-10-07 NOTE — NC FL2 (Signed)
MEDICAID FL2 LEVEL OF CARE SCREENING TOOL     IDENTIFICATION  Patient Name: [name] Williams Birthdate: 1955/10/11 Sex: male Admission Date (Current Location): 10/04/2017  [facility] and [place] Number:  Producer, television/film/video and Address:  [facility] [place], [address] [place], [place] [address]      Provider Number: [id number]  Attending Physician Name and Address:  Carney Living, MD  Relative Name and Phone Number:       Current Level of Care: Hospital Recommended Level of Care: Skilled Nursing Facility Prior Approval Number:    Date Approved/Denied:   PASRR Number: [id number]  Discharge Plan: SNF    Current Diagnoses: Patient Active Problem List   Diagnosis Date Noted  . Acute pyelonephritis   . Dizziness   . Hypertension   . Chronic systolic congestive heart failure (HCC)   . Elevated troponin   . Sepsis (HCC)   . Altered mental status, unspecified 10/04/2017  . Fever   . Leukocytosis   . Schizophrenia (HCC) 10/31/2014  . 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 CVA (cerebral infarction) 06/26/2014  . 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 Sensory deficit, left 06/26/2014  . 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 Diplopia   . Acute CVA (cerebrovascular accident) (HCC) 06/23/2014  . Stroke-like symptom 06/23/2014  . Preventative health care 08/29/2013  . History of CVA (cerebrovascular accident) 12/01/2009  . HLD (hyperlipidemia) 04/08/2008  . DYSLIPIDEMIA 04/08/2008  . TOBACCO ABUSE 04/08/2008    Orientation RESPIRATION BLADDER Height & Weight     Self, Time, Situation, Place  Normal Incontinent Weight: 150 lb (68 kg) Height:  5\' 9"  (175.3 cm)  BEHAVIORAL SYMPTOMS/MOOD NEUROLOGICAL BOWEL NUTRITION STATUS      Continent Diet(carb modified, heart healthy)  AMBULATORY STATUS COMMUNICATION OF NEEDS Skin   Extensive Assist Verbally Normal                       Personal Care Assistance Level of Assistance  Bathing,  Feeding, Dressing Bathing Assistance: Limited assistance Feeding assistance: Independent Dressing Assistance: Limited assistance     Functional Limitations Info  Sight, Hearing, Speech Sight Info: Adequate Hearing Info: Adequate Speech Info: Adequate    SPECIAL CARE FACTORS FREQUENCY  PT (By licensed PT), OT (By licensed OT)     PT Frequency: 5x/wk OT Frequency: 5x/wk            Contractures Contractures Info: Not present    Additional Factors Info  Code Status, Allergies, Insulin Sliding Scale Code Status Info: Partial Allergies Info: NKA   Insulin Sliding Scale Info: 0-15 units 3x/day with meals       Current Medications (10/07/2017):  This is the current hospital active medication list Current Facility-Administered Medications  Medication Dose Route Frequency Provider Last Rate Last Dose  . acetaminophen (TYLENOL) tablet 650 mg  650 mg Oral Q6H PRN Lockamy, Timothy, DO   650 mg at 10/07/17 0121   Or  . acetaminophen (TYLENOL) suppository 650 mg  650 mg Rectal Q6H PRN Lockamy, Timothy, DO      . aspirin chewable tablet 81 mg  81 mg Oral Daily Garnette Gunner, MD   81 mg at 10/07/17 2595  . cephALEXin (KEFLEX) capsule 500 mg  500 mg Oral Q6H Garnette Gunner, MD   500 mg at 10/07/17 6387  . cholecalciferol (VITAMIN D) tablet 1,000 Units  1,000 Units Oral Daily Lockamy, Timothy, DO   1,000 Units at 10/07/17 0823  .  heparin injection 5,000 Units  5,000 Units Subcutaneous Q8H Lockamy, Timothy, DO   5,000 Units at 10/07/17 [id number]  . insulin aspart (novoLOG) injection 0-15 Units  0-15 Units Subcutaneous TID WC Lockamy, Timothy, DO   2 Units at 10/07/17 0703  . ondansetron (ZOFRAN) tablet 4 mg  4 mg Oral Q6H PRN Lockamy, Timothy, DO       Or  . ondansetron (ZOFRAN) injection 4 mg  4 mg Intravenous Q6H PRN Lockamy, Timothy, DO      . polyethylene glycol (MIRALAX / GLYCOLAX) packet 17 g  17 g Oral Daily PRN Lockamy, Timothy, DO      . sodium chloride 0.9 % bolus 500 mL  500  mL Intravenous Once Arlyce HarmanLockamy, Timothy, DO         Discharge Medications: Please see discharge summary for a list of discharge medications.  Relevant Imaging Results:  Relevant Lab Results:   Additional Information SS#: [id number]  Baldemar LenisElizabeth M Giles Currie, LCSW

## 2017-10-07 NOTE — Progress Notes (Signed)
Family Medicine Teaching Service Daily Progress Note Intern Pager: [phone]  Patient name: [name] NeerJames Williams Medical record number: [id number] Date of birth: 03-Sep-1955 Age: 62 y.o. Gender: male  Primary Care Provider: Annie SableKohler, Patricia Lynn, NP Consultants: Neuro Code Status: Partial (DNR but DO intubate)  Pt Overview and Major Events to Date:  10/04/2017: Admitted, started on vanc/zosyn 10/05/2017: transition to CTX with +Bcx and UCx w klebsiella 10/06/2017: UCx sensitive to cephalosporins; transition to cephalexin 10/07/2017: MRI with two acute ischemic infarcts  Assessment and Plan: Theodosia PalingJames Reavesis a 61 y.o.malepresenting with altered mental status and syncope. PMH is significant forHTN, T2DM, CVA in 2011, HLD, and schizophrenia.   Acute CVA: MRI brain with small foci of acute ischemia in the posterior right lentiform nucleus and left corona radiata. Most of stroke work-up has already been performed this admission. Concern for embolic phenomenon. Exam this morning with 3/5 weakness on the right and 4/5 weakness on the left. Awake and answering questions this morning. - Neuro consulted, appreciated recommendations - TEE ordered. Spoke with Cards- will hopefully happen Monday. If negative, will need 30 day monitor or loop recorder to r/o A-fib. - Continue Aspirin 81mg  daily - Increase Lipitor from 20mg  to 40mg  daily - PT/OT recommending SNF on discharge  Klebsiella Bacteremia 2/2 UTI: Afebrile overnight. WBC 10.8 > 10.1. Random bladder scan was 168 and he is having really good urine output, so don't think BPH is contributing. - Continue Keflex. Plan for 7 day course (ending 2/25)  HTN: Normotensive overnight - Continue home lisinopril 20mg  daily - Hold home amlodipine 5mg  and HCTZ 12.5 given BP at goal  T2DM:A1C7.7 on admission; on metformin 1000mg  BID at home. - hold metformin  - moderate SSI, CBGs  HLD: LDL 79, cholesterol 123, HDL 20 2/21 - Increase Lipitor from 20mg  daily  to 40mg  daily  Schizophrenia:Stable.Followed by [facility]; receives paliperidone once monthly (last on 2/11).   FEN/GI: Heart healthy carb-modified diet Prophylaxis:heparin sq  Disposition: Awaiting TEE, hopefully will happen Monday. Plan for discharge to SNF after that.  Subjective:  Patient doing well this morning. States he is feeling fine. No concerns. Still endorsing weakness all over.  Objective: Temp:  [99.3 F (37.4 C)-101 F (38.3 C)] 99.3 F (37.4 C) (02/22 1602) Pulse Rate:  [50-81] 60 (02/22 1602) Resp:  [17-18] 17 (02/22 1602) BP: (132-142)/(60-88) 136/68 (02/22 1602) SpO2:  [96 %-100 %] 96 % (02/22 1602) Physical Exam: General: Awake, sitting up in bed, in NAD Cardiovascular: RRR, no murmurs Respiratory: CTAB, normal WOB Extremities: warm and well-perfused Neuro: Alert, oriented x 3, 3/5 muscle strength in the RUE and RLE, 4/5 muscle strength in the LUE and LLE  Laboratory: Recent Labs  Lab 10/05/17 0416 10/06/17 0409 10/07/17 0715  WBC 14.4* 12.4* 10.8*  HGB 11.0* 10.5* 11.2*  HCT 34.0* 33.6* 34.7*  PLT 120* 115* 135*   Recent Labs  Lab 10/04/17 1518  10/05/17 0416 10/06/17 0409 10/07/17 0715  NA 140  --  139 137 137  K 3.8  --  4.0 3.9 3.8  CL 104  --  104 104 103  CO2 23  --  22 24 21*  BUN 16  --  15 15 11   CREATININE 1.34*   < > 1.41* 1.16 1.09  CALCIUM 9.2  --  8.7* 8.4* 8.8*  PROT 6.3*  --   --   --   --   BILITOT 0.8  --   --   --   --   ALKPHOS 48  --   --   --   --  ALT 18  --   --   --   --   AST 29  --   --   --   --   GLUCOSE 169*  --  134* 98 127*   < > = values in this interval not displayed.    Imaging/Diagnostic Tests: CT head negative ECHO: 50-55%, mild LVH, no diastolic dysfunction MRI brain: small foci of acute ischemia within the posterior right lentiform nucleus and left corona radiata.  Azaleah Usman, Allyn Kenner, MD 10/07/2017, 7:22 PM PGY-3, [facility] Family Medicine FPTS Intern pager: [phone], text pages welcome

## 2017-10-07 NOTE — Progress Notes (Signed)
Physical Therapy Treatment Patient Details Name: [name] Williams Buening MRN: [id number] DOB: 07/02/1956 Today's Date: 10/07/2017    History of Present Illness [name] Williams Adorno is a 62 y.o. male presenting with altered mental status and syncope. PMH is significant for HTN, T2DM, CVA in 2011, HLD, and schizophrenia.     PT Comments    Pt was more alert today and able to participate with therapy. Pt has significant muscle tightness and general weakness globally. Pt was able to tolerate gently LE stretching and there ex for strengthening. Pt sat EOB with min assist, but with max cues for technique and sequencing. Pt stood and ambulated with mod assist. Pt does fatigue quickly and is a high fall risk. Pt responded well to therapy and anticipate he will have good results with continued skilled PT. Per case management pt is not able to go to CIR. Therefore, I would recommend SNF for continued skilled PT to maximize mobility and independence. Pt was previously mod indep.with  RW and will need increased time to return to prior functional level.  Follow Up Recommendations  SNF     Equipment Recommendations  None recommended by PT    Recommendations for Other Services       Precautions / Restrictions Precautions Precautions: Fall Restrictions Weight Bearing Restrictions: No    Mobility  Bed Mobility Overal bed mobility: Needs Assistance Bed Mobility: Supine to Sit Rolling: Min assist(to left side)   Supine to sit: Min assist     General bed mobility comments: HOB elevated, use of bed rail with L hand, max cues for technique to assist with sequencing and increasing independence.  Transfers Overall transfer level: Needs assistance Equipment used: Rolling walker (2 wheeled) Transfers: Sit to/from Stand Sit to Stand: Min assist         General transfer comment: cues for technique, cues for more upright posture, increased muscle tightness noted in LE and neck,   Ambulation/Gait Ambulation/Gait  assistance: Mod assist Ambulation Distance (Feet): 4 Feet Assistive device: Rolling walker (2 wheeled) Gait Pattern/deviations: Step-to pattern;Decreased stride length;Trunk flexed;Shuffle Gait velocity: decreased Gait velocity interpretation: Below normal speed for age/gender General Gait Details: max cues for sequencing, able to side step and move RW with cues, no posterior lean noted with standing, cues for more upright posture, increased difficulty shifting over L LE to unload R LE to stake a step.    Stairs            Wheelchair Mobility    Modified Rankin (Stroke Patients Only)       Balance Overall balance assessment: Needs assistance Sitting-balance support: Feet supported Sitting balance-Leahy Scale: Poor Sitting balance - Comments: sat EOB X 10 mins, worked on reaching tasks with Bil UE while maintaining balance, pt required min assist for dynamic balance control and had some increased difficulty with ROM of Bil shoulders with shoulder elevation over 90 degrees. Postural control: Posterior lean;Left lateral lean Standing balance support: Bilateral upper extremity supported;During functional activity Standing balance-Leahy Scale: Poor Standing balance comment: +1 gait with max cues and recliner near for safety. Pt fatigues quickly,                             Cognition Arousal/Alertness: Awake/alert Behavior During Therapy: Flat affect Overall Cognitive Status: No family/caregiver present to determine baseline cognitive functioning  Exercises General Exercises - Lower Extremity Ankle Circles/Pumps: AROM;Both;Supine Short Arc Quad: AAROM;AROM;Strengthening;Both;Supine Heel Slides: AROM;Strengthening;Supine Hip ABduction/ADduction: AROM;Strengthening;Both;Supine Straight Leg Raises: AAROM;Strengthening;Both;Supine Other Exercises Other Exercises: Bil hamstring stretch PROM x10 Other Exercises:  Bil hip adductor stretch PROM x 10    General Comments General comments (skin integrity, edema, etc.): Pt demonstrated significant muscle tightness due to deconditioning and decreased activity. Focused on LE stretching and more upright posture during functional activities.      Pertinent Vitals/Pain Pain Assessment: Faces Pain Score: 4  Faces Pain Scale: No hurt Pain Location: legs, pain with ther ex due to muscle tightness Pain Descriptors / Indicators: Discomfort Pain Intervention(s): Monitored during session;Repositioned    Home Living                      Prior Function            PT Goals (current goals can now be found in the care plan section) Progress towards PT goals: Progressing toward goals    Frequency    Min 3X/week      PT Plan Discharge plan needs to be updated    Co-evaluation              AM-PAC PT "6 Clicks" Daily Activity  Outcome Measure  Difficulty turning over in bed (including adjusting bedclothes, sheets and blankets)?: Unable Difficulty moving from lying on back to sitting on the side of the bed? : Unable Difficulty sitting down on and standing up from a chair with arms (e.g., wheelchair, bedside commode, etc,.)?: Unable Help needed moving to and from a bed to chair (including a wheelchair)?: A Lot Help needed walking in hospital room?: A Lot Help needed climbing 3-5 steps with a railing? : Total 6 Click Score: 8    End of Session Equipment Utilized During Treatment: Gait belt Activity Tolerance: Patient tolerated treatment well Patient left: in chair;with call bell/phone within reach;with chair alarm set Nurse Communication: Mobility status PT Visit Diagnosis: Muscle weakness (generalized) (M62.81);History of falling (Z91.81);Other symptoms and signs involving the nervous system (R29.898);Unsteadiness on feet (R26.81)     Time: 1610-9604 PT Time Calculation (min) (ACUTE ONLY): 40 min  Charges:  $Gait Training: 8-22  mins $Therapeutic Exercise: 8-22 mins $Therapeutic Activity: 8-22 mins                    G Codes:       Lilyan Punt, PT   Greggory Stallion 10/07/2017, 10:28 AM

## 2017-10-07 NOTE — Evaluation (Signed)
Clinical/Bedside Swallow Evaluation Patient Details  Name: Cleotilde NeerJames Deak MRN: [id number] Date of Birth: 1956/08/13  Today's Date: 10/07/2017 Time: SLP Start Time (ACUTE ONLY): 0725 SLP Stop Time (ACUTE ONLY): 0745 SLP Time Calculation (min) (ACUTE ONLY): 20 min  Past Medical History:  Past Medical History:  Diagnosis Date  . Diabetes mellitus   . History of stroke   . Hypertension    Past Surgical History: History reviewed. No pertinent surgical history. HPI:  [name] PalingJames Reavesis a 62 y.o.malepresenting with altered mental status and syncope. PMH is significant forHTN, T2DM, CVA in 2011, HLD, and schizophrenia.  Patient presents with unwitnessedfall (per sister, has been falling more frequently x 3wks)andaltered mental status earlier in the day. ROS positive for diarrhea x 5 d and cough/chills today, otherwise negative. On admission, tachycardic but normotensive and afebrile with intermittent tachypnea. On exam, patient oriented x2 but difficult to arouse (sister notes this is significantly different than his baseline) w pinpoint pupils and diminished strength bilaterally (although poor effort) but otherwise wnl. Admission labs remarkable for WBC 12.5, Cr 1.34, and lactate 2.46 (resolved post-IVF) but unremarkable EKG, neg trop, and neg flu. CT head on admission without acute abnormality. SOFA 2 (creatinine and GCS) with elevated lactate concerning for sepsis. Ddx broad but includes viral URI (+nasal crusting on exam, cough, chills although this does not explain pinpoint pupils or sedation), intoxication (pinpoint pupils on exam, but pt denies drug use), meningitis (although some neck soreness on exam, able to flex and has pain with lateral rotation and denies photophobia, headache, and vision change), CVA (although no focal deficits on exam, concerning in a patient who has a history of CVA and was unable to walk following syncope), ACS (although EKG and trop unremarkable), or GI pathogen (could be  orthostasis 2/2 dehydration but would not explain pinpoint pupils and abd exam benign). Unlikely urinary source (UA unremarkable), although will follow-up urine culture. Plan to treat as sepsis with unknown source pending further workup. CT work up didn't reveal any acute intracranial abnormality, but did show several chronic infarcts, as   Assessment / Plan / Recommendation Clinical Impression  Pt presents with mild risk for aspiration following general aspiration precautions. Pt is largely endentulous but demonstrates functional oral abilities with regular breakfast tray. Pt consumed thin liquids via cup without overt s/s of aspiration. Recommend continuing regular diet. Order was for BSE, completed and no further skilled ST is indicated to target dysphagia. However, would recommend possibility of SLE at next venue of care to prepare for return to community.  SLP Visit Diagnosis: Dysphagia, unspecified (R13.10)    Aspiration Risk  No limitations;Mild aspiration risk    Diet Recommendation Regular;Thin liquid   Liquid Administration via: Cup Medication Administration: Whole meds with liquid Supervision: Patient able to self feed Compensations: Minimize environmental distractions;Slow rate;Small sips/bites Postural Changes: Seated upright at 90 degrees    Other  Recommendations Oral Care Recommendations: Oral care BID   Follow up Recommendations Inpatient Rehab        Swallow Study   General Date of Onset: 10/04/17 HPI: [name] PalingJames Reavesis a 62 y.o.malepresenting with altered mental status and syncope. PMH is significant forHTN, T2DM, CVA in 2011, HLD, and schizophrenia.  Patient presents with unwitnessedfall (per sister, has been falling more frequently x 3wks)andaltered mental status earlier in the day. ROS positive for diarrhea x 5 d and cough/chills today, otherwise negative. On admission, tachycardic but normotensive and afebrile with intermittent tachypnea. On exam, patient  oriented x2 but difficult to arouse (sister  notes this is significantly different than his baseline) w pinpoint pupils and diminished strength bilaterally (although poor effort) but otherwise wnl. Admission labs remarkable for WBC 12.5, Cr 1.34, and lactate 2.46 (resolved post-IVF) but unremarkable EKG, neg trop, and neg flu. CT head on admission without acute abnormality. SOFA 2 (creatinine and GCS) with elevated lactate concerning for sepsis. Ddx broad but includes viral URI (+nasal crusting on exam, cough, chills although this does not explain pinpoint pupils or sedation), intoxication (pinpoint pupils on exam, but pt denies drug use), meningitis (although some neck soreness on exam, able to flex and has pain with lateral rotation and denies photophobia, headache, and vision change), CVA (although no focal deficits on exam, concerning in a patient who has a history of CVA and was unable to walk following syncope), ACS (although EKG and trop unremarkable), or GI pathogen (could be orthostasis 2/2 dehydration but would not explain pinpoint pupils and abd exam benign). Unlikely urinary source (UA unremarkable), although will follow-up urine culture. Plan to treat as sepsis with unknown source pending further workup. CT work up didn't reveal any acute intracranial abnormality, but did show several chronic infarcts, as Type of Study: Bedside Swallow Evaluation Previous Swallow Assessment: none in chart Diet Prior to this Study: Regular;Thin liquids Temperature Spikes Noted: No Respiratory Status: Room air History of Recent Intubation: No Behavior/Cognition: Alert;Cooperative;Pleasant mood Oral Cavity Assessment: Within Functional Limits Oral Care Completed by SLP: No Oral Cavity - Dentition: Missing dentition;Poor condition(total of 3 teeth) Vision: Functional for self-feeding Self-Feeding Abilities: Able to feed self;Needs set up Patient Positioning: Upright in bed Baseline Vocal Quality:  Normal Volitional Cough: Strong Volitional Swallow: Able to elicit    Oral/Motor/Sensory Function Overall Oral Motor/Sensory Function: Within functional limits   Ice Chips Ice chips: Within functional limits Presentation: Spoon;Self Fed   Thin Liquid Thin Liquid: Within functional limits Presentation: Cup;Self Fed    Nectar Thick Nectar Thick Liquid: Not tested   Honey Thick Honey Thick Liquid: Not tested   Puree Puree: Not tested   Solid   GO   Solid: Within functional limits Presentation: Self Fed        Sacheen Arrasmith 10/07/2017,8:00 AM

## 2017-10-07 NOTE — Progress Notes (Signed)
Patient off floor to MRI

## 2017-10-07 NOTE — Progress Notes (Signed)
Family Medicine Teaching Service Daily Progress Note Intern Pager: [phone]  Patient name: [name] Williams Medical record number: [id number] Date of birth: [birth year] Age: 62 y.o. Gender: male  Primary Care Provider: Annie Sable, NP Consultants:  Code Status: Partial (DNR but DO intubate)  Pt Overview and Major Events to Date:  10/04/2017: Admitted, started on vanc/zosyn 10/05/2017: transition to CTX with +Bcx and UCx w klebsiella 10/06/2017: UCx sensitive to cephalosporins; transition to cephalexin  Assessment and Plan: Kohan Azizi a 62 y.o.malepresenting with altered mental status and syncope. PMH is significant forHTN, T2DM, CVA in 2011, HLD, and schizophrenia.   Klebsiella Bacteremia 2/2 UTI: Patient's mental status continues to improve after transitioning from CTX to cephalexin and his leukocytosis improved to 10.8, however, he was febrile overnight to 101F and he continues to exhibit weakness on exam (see below). Given clinical improvement, HDS, and culture sensitive to cephalexin, will continue with current course. - continue cephalexin 500mg  Q6H (2/21- ), s/p CTX (2/20); s/p vanc/zosyn (2/19-2/20); plan for 7d total course - will check PVR to assess for urinary obstruction/BPH as a possible contributing factor to his UTI; can consider tamsulosin if elevated - ondansetron 4mg  Q6H prn nausea - acetaminophen 650mg  Q6H prn pain - PT/OT consult: now recommending sNF - SLP consult: regular diet, thin liquids  Weakness  Hx CVA: Pt continues to have weakness and remains unable to ambulate (which he is able to do at baseline), although his neuro exam is non-focal and he exhibits globalized weakness, it seems unlikely that he would have such a dramatic functional decline related to deconditioning in such a short period. CT Head and C spine on admission w/o acute abnormality and CK mildly elevated but in the setting of being found on the ground.  - Will pursue further workup,  starting with MRI to assess for CVA - Can consider neurology consult - Daily neurological exams - continue ASA 81 mg - start atorvastatin 20mg  daily for hx CVA  HTN: Blood pressure at goal overnight.  - Resume home lisinopril 20mg  daily - Hold home amlodipine 5mg  and HCTZ 12.5 given BP at goal  T2DM:A1C7.7 on admission; on metformin 1000mg  BID at home. - hold metformin  - moderate SSI, CBGs  HLD: reported by patient; LDL 79, cholesterol 123, HDL 20 2/21 - atorvastatin 20mg  for history of stroke  Schizophrenia:Stable.Followed by [facility]; receives paliperidone once monthly (last on 2/11).   FEN/GI: Carb consistent diet, miralax prn Prophylaxis:heparin for PPX  Disposition: Possible discharge to SNF today.  Subjective:  Patient reports some pain in his feet this morning but otherwise has no complaints.   Objective: Temp:  [99.3 F (37.4 C)-101 F (38.3 C)] 99.4 F (37.4 C) (02/22 1200) Pulse Rate:  [50-81] 81 (02/22 1200) Resp:  [17-20] 17 (02/22 1200) BP: (132-142)/(60-88) 142/86 (02/22 1200) SpO2:  [97 %-100 %] 100 % (02/22 1200) Physical Exam: General: Alert, oriented, conversant, oriented x3  Cardiovascular: RRR, no m/r/g, 2+ radial pulses and cap refill <3 sec Respiratory: ?crackle heard in LL base but resolved once patient coughed, otherwise lungs CTA b/l, normal WOB on RA.  Abdomen: soft, nontender, nondistended, normoactive bowel sounds Extremities: WWP, no peripheral edema. 3/5 strength of b/l LEs (able to lift several inches against gravity but not against force), 4/5 strength in upper extremities, L>R  Laboratory: Recent Labs  Lab 10/05/17 0416 10/06/17 0409 10/07/17 0715  WBC 14.4* 12.4* 10.8*  HGB 11.0* 10.5* 11.2*  HCT 34.0* 33.6* 34.7*  PLT 120* 115* 135*  Recent Labs  Lab 10/04/17 1518  10/05/17 0416 10/06/17 0409 10/07/17 0715  NA 140  --  139 137 137  K 3.8  --  4.0 3.9 3.8  CL 104  --  104 104 103  CO2 23  --  22 24 21*  BUN 16   --  15 15 11   CREATININE 1.34*   < > 1.41* 1.16 1.09  CALCIUM 9.2  --  8.7* 8.4* 8.8*  PROT 6.3*  --   --   --   --   BILITOT 0.8  --   --   --   --   ALKPHOS 48  --   --   --   --   ALT 18  --   --   --   --   AST 29  --   --   --   --   GLUCOSE 169*  --  134* 98 127*   < > = values in this interval not displayed.    Imaging/Diagnostic Tests: none  Desiree HaneMichaels, Arianna, Medical Student 10/07/2017, 12:21 PM MS4, [facility] FPTS Intern pager: [phone], text pages welcome  FPTS Upper-Level Resident Addendum  I have independently interviewed and examined the patient. I have discussed the above with the original author and agree with their documentation. I have edited the note as appropriate.  Willadean CarolKaty Mayo, MD PGY-3, [facility] FPTS Service pager: [phone] (text pages welcome through AMION)

## 2017-10-07 NOTE — Progress Notes (Signed)
Inpatient Rehabilitation  Discussed case with Jeanice LimHolly, CSW this am and [facility] MD had denied IP Rehab.  Will sign off at this time.  Call if questions.   Charlane FerrettiMelissa Shaquasia Caponigro, M.A., CCC/SLP Admission Coordinator  [facility]  Cell [phone]

## 2017-10-07 NOTE — Clinical Social Work Note (Signed)
Clinical Social Work Assessment  Patient Details  Name: [name] NeerJames Rhyne MRN: [id number] Date of Birth: 05/21/1956  Date of referral:  10/07/17               Reason for consult:  Facility Placement                Permission sought to share information with:  Facility Medical sales representativeContact Representative, Family Supports Permission granted to share information::  Yes, Verbal Permission Granted  Name::     Education administratorJoann  Agency::  SNF  Relationship::  Sister  Contact Information:     Housing/Transportation Living arrangements for the past 2 months:  Single Family Home Source of Information:  Outpatient Provider, Siblings Patient Interpreter Needed:  None Criminal Activity/Legal Involvement Pertinent to Current Situation/Hospitalization:  No - Comment as needed Significant Relationships:  Siblings Lives with:  Self, Siblings Do you feel safe going back to the place where you live?  Yes Need for family participation in patient care:  No (Coment)  Care giving concerns:  Patient lives at home with sister and [facility]. Patient will benefit from short term rehab at discharge to improve mobility prior to returning home.   Social Worker assessment / plan:  CSW received update from rehab admissions and [facility] social worker [place] that patient has been denied admission for CIR, and would like SNF at discharge. CSW updated from [place] that patient's [facility]. CS[facility] sent referral and [facility].  Employment status:  Retired Database administratornsurance information:  Managed Medicare PT Recommendations:  Skilled Nursing Facility Information / Referral to community resources:  Skilled Nursing Facility  Patient/Family's Response to care:  Patient and family agreeable to SNF placement.  Patient/Family's Understanding of and Emotional Response to Diagnosis, Current Treatment, and Prognosis:  Patient and sister are aware that the patient will need rehab at discharge, as he has stairs to  enter into the home and is unable to navigate them at this time. Patient's sister is going to work on installing a ramp for the patient to navigate more safely into the home when he returns home.  Emotional Assessment Appearance:  Appears stated age Attitude/Demeanor/Rapport:  Engaged Affect (typically observed):  Appropriate Orientation:  Oriented to Self, Oriented to Place, Oriented to  Time, Oriented to Situation Alcohol / Substance use:  Not Applicable Psych involvement (Current and /or in the community):  No (Comment)  Discharge Needs  Concerns to be addressed:  Care Coordination Readmission within the last 30 days:  No Current discharge risk:  Physical Impairment, Dependent with Mobility Barriers to Discharge:  Continued Medical Work up   Dollar GeneralElizabeth M Montasia Chisenhall, LCSW 10/07/2017, 3:29 PM

## 2017-10-07 NOTE — Consult Note (Signed)
Neurology Consultation  Reason for Consult: Abnormal MRI Referring Physician: Dr. Deirdre Priest  CC: Abnormal MRI  History is obtained from: Chart, patient  HPI: [name] Williams is a 62 y.o. male brought in on 10/04/2017 with unwitnessed fall and altered mental status with a history of diarrhea, fevers cough chills preceding the admission. On admission he was tachycardic but normotensive afebrile with some tachypnea. He was oriented x2 but difficult to arouse, which was different than his baseline. Laboratory findings revealed a white count of 12.5, creatinine 1.34 and lactate 2.46 that resolved after IV fluid resuscitation. Preliminary brain imaging by noncontrast CT of the head unremarkable.  Urinary tox screen was negative. He was found to have a UTI and sepsis secondary to Klebsiella. He was recovering well with antibiotic treatment and fluid resuscitation for sepsis. An MRI of the brain was ordered as a part of altered mental status workup which was completed today and showed 2 small foci of ischemia within the right lentiform nucleus and left corona radiata along with evidence of chronic microangiopathy and evidence of old cerebellar strokes and bilateral lacunar infarcts. Neurological consultation was placed for further recommendations.  Patient does not complain of any lateralized weakness tingling numbness. He denies any headaches or visual symptoms.  He denies any chest pain or palpitations. He reports of generalized weakness with worsening of right and left-sided weakness over the past 2-3 years. Upon review of his chart, he had a pontine stroke in 2011.  Upon questioning him about the workup and etiology for that stroke, he says that it was probably related to high blood pressure. He is a current smoker.  Denies illicit drug use. During his hospitalization he had a workup for stroke including a 2D echocardiogram which revealed ejection fraction of 50-55%, mild left ventricular dilatation,  no PFO or atrial septal defect. LDL 77 A1c 7.7  LKW: at least 2 days ago tpa given?: no, OSW Premorbid modified Rankin scale (mRS): 0 ICH Score:   ROS: ROS completed. Positives in HPI. Rest negative..   Past Medical History:  Diagnosis Date  . Diabetes mellitus   . History of stroke   . Hypertension     Family History  Problem Relation Age of Onset  . Hypertension Mother   . Heart attack Mother        deceased from MI in 41s  . Hypertension Father   . Coronary artery disease Maternal Aunt   . Diabetes Maternal Aunt   . Coronary artery disease Maternal Uncle   . Diabetes Maternal Uncle   . Diabetes Paternal Aunt   . Coronary artery disease Paternal Aunt   . Diabetes Paternal Uncle   . Coronary artery disease Paternal Uncle   . Heart attack Sister        deceased from MI in 50s   Social History:   reports that he has been smoking cigarettes.  He has a 10.00 pack-year smoking history. he has never used smokeless tobacco. He reports that he uses drugs. He reports that he does not drink alcohol.  Medications  Current Facility-Administered Medications:  .  acetaminophen (TYLENOL) tablet 650 mg, 650 mg, Oral, Q6H PRN, 650 mg at 10/07/17 0121 **OR** acetaminophen (TYLENOL) suppository 650 mg, 650 mg, Rectal, Q6H PRN, Lockamy, Timothy, DO .  aspirin chewable tablet 81 mg, 81 mg, Oral, Daily, Garnette Gunner, MD, 81 mg at 10/07/17 1610 .  atorvastatin (LIPITOR) tablet 20 mg, 20 mg, Oral, q1800, Lockamy, Timothy, DO .  cephALEXin (KEFLEX) capsule 500  mg, 500 mg, Oral, Q6H, Garnette Gunner, MD, 500 mg at 10/07/17 1134 .  cholecalciferol (VITAMIN D) tablet 1,000 Units, 1,000 Units, Oral, Daily, Lockamy, Timothy, DO, 1,000 Units at 10/07/17 0823 .  heparin injection 5,000 Units, 5,000 Units, Subcutaneous, Q8H, Lockamy, Timothy, DO, 5,000 Units at 10/07/17 1430 .  insulin aspart (novoLOG) injection 0-15 Units, 0-15 Units, Subcutaneous, TID WC, Lockamy, Timothy, DO, 5 Units at  10/07/17 1219 .  lisinopril (PRINIVIL,ZESTRIL) tablet 20 mg, 20 mg, Oral, Daily, Lockamy, Timothy, DO, 20 mg at 10/07/17 1134 .  ondansetron (ZOFRAN) tablet 4 mg, 4 mg, Oral, Q6H PRN **OR** ondansetron (ZOFRAN) injection 4 mg, 4 mg, Intravenous, Q6H PRN, Lockamy, Timothy, DO .  polyethylene glycol (MIRALAX / GLYCOLAX) packet 17 g, 17 g, Oral, Daily, Lockamy, Timothy, DO, 17 g at 10/07/17 1133 .  sodium chloride 0.9 % bolus 500 mL, 500 mL, Intravenous, Once, Lockamy, Timothy, DO  Exam: Current vital signs: BP 136/68 (BP Location: Right Arm)   Pulse 60   Temp 99.3 F (37.4 C) (Axillary)   Resp 17   Ht 5\' 9"  (1.753 m)   Wt 68 kg (150 lb)   SpO2 96%   BMI 22.15 kg/m  Vital signs in last 24 hours: Temp:  [99.3 F (37.4 C)-101 F (38.3 C)] 99.3 F (37.4 C) (02/22 1602) Pulse Rate:  [50-81] 60 (02/22 1602) Resp:  [17-18] 17 (02/22 1602) BP: (132-142)/(60-88) 136/68 (02/22 1602) SpO2:  [96 %-100 %] 96 % (02/22 1602) General exam: Well-developed well-nourished man in no acute distress HEENT: Normocephalic atraumatic moist oral mucous members, edentulous CVS: S1-S2 regular rate rhythm Respiratory: Chest clear to auscultation Abdomen: Nontender nondistended Neurological exam Patient is awake alert oriented x3 His speech is mildly dysarthric-unclear if this is his baseline neurologist because he is edentulous. Cranial nerves: Pupils equal round reactive light, extraocular muscles intact, visual fields full, face symmetric, facial sensation intact bilaterally, auditory acuity grossly intact, palate elevates midline, tongue midline, shoulder shrug intact bilaterally. Motor exam: Upper extremities bilaterally 4/5 Lower extremities bilaterally 4-/5-if anything the right leg is stronger than the left leg mildly. Sensory exam: Intact to light touch with no extinction Coordination: Intact finger nose finger bilaterally Gait was not tested NIH stroke scale-3 (1 for dysarthria, 1 for each  leg)   Labs I have reviewed labs in epic and the results pertinent to this consultation are:  CBC    Component Value Date/Time   WBC 10.8 (H) 10/07/2017 0715   RBC 4.01 (L) 10/07/2017 0715   HGB 11.2 (L) 10/07/2017 0715   HCT 34.7 (L) 10/07/2017 0715   HCT 34.0 (L) 10/04/2017 1728   PLT 135 (L) 10/07/2017 0715   MCV 86.5 10/07/2017 0715   MCH 27.9 10/07/2017 0715   MCHC 32.3 10/07/2017 0715   RDW 14.2 10/07/2017 0715   LYMPHSABS 0.3 (L) 10/04/2017 1314   MONOABS 0.3 10/04/2017 1314   EOSABS 0.0 10/04/2017 1314   BASOSABS 0.0 10/04/2017 1314    CMP     Component Value Date/Time   NA 137 10/07/2017 0715   K 3.8 10/07/2017 0715   CL 103 10/07/2017 0715   CO2 21 (L) 10/07/2017 0715   GLUCOSE 127 (H) 10/07/2017 0715   BUN 11 10/07/2017 0715   CREATININE 1.09 10/07/2017 0715   CREATININE 1.19 08/29/2013 1621   CALCIUM 8.8 (L) 10/07/2017 0715   PROT 6.3 (L) 10/04/2017 1518   ALBUMIN 3.3 (L) 10/04/2017 1518   AST 29 10/04/2017 1518   ALT 18  10/04/2017 1518   ALKPHOS 48 10/04/2017 1518   BILITOT 0.8 10/04/2017 1518   GFRNONAA >60 10/07/2017 0715   GFRAA >60 10/07/2017 0715    Lipid Panel     Component Value Date/Time   CHOL 123 10/07/2017 0715   TRIG 119 10/07/2017 0715   HDL 20 (L) 10/07/2017 0715   CHOLHDL 6.2 10/07/2017 0715   VLDL 24 10/07/2017 0715   LDLCALC 79 10/07/2017 0715     Imaging I have reviewed the images obtained: MRI examination of the brain -MRI showed right lentiform nucleus and left corona radiata strokes with evidence of chronic microangiopathy.  Prior MRI from 8 years ago showed a pontine stroke.   Assessment:  62 year old man with unwitnessed fall altered mental status, found to have UTI and Klebsiella bacteremia, initially seen for altered mental status now improving with antibiotics received a brain MRI for completion of workup for altered mental status.  MRI of the brain shows 2 lacunar-looking infarcts in right and left cerebral  hemisphere. Prior MRI from 2011 showed a pontine stroke. All these locations are common for strokes from small vessel etiology. That said, given the current infection and bacteremia, and scattered strokes although in the small vessel territory, an embolic source from the heart should be seriously investigated.  Impression: Acute ischemic stroke-likely small vessel, rule out embolic because of recent bacteremia  Recommendations: Most of the stroke workup is already been completed. Echo, A1c, lipid panel have been completed reports above. Patient should be on aspirin and statin Continue to keep him on telemetry His 2D echocardiogram is unrevealing for an embolic source. I would recommend obtaining a transesophageal echo cardiogram. If the TEE is negative, he would benefit from long-term cardiac monitoring with a 30-day monitor or loop recorder. PT/OT/speech therapy  Patient will be followed by stroke team in the morning.  Please page the stroke team NP/MD on AMION.  -- Milon DikesAshish Alexandria Current, MD [facility] Pager: [phone] If 7pm to 7am, please call on call as listed on AMION.

## 2017-10-07 NOTE — Care Management Important Message (Signed)
Important Message  Patient Details  Name: [name] Williams MRN: [id number] Date of Birth: March 19, 1956   Medicare Important Message Given:  Yes    Dorena BodoIris Nehemie Casserly 10/07/2017, 12:36 PM

## 2017-10-08 ENCOUNTER — Encounter (HOSPITAL_COMMUNITY): Payer: Self-pay | Admitting: *Deleted

## 2017-10-08 ENCOUNTER — Inpatient Hospital Stay (HOSPITAL_COMMUNITY): Payer: Medicare (Managed Care)

## 2017-10-08 DIAGNOSIS — E1159 Type 2 diabetes mellitus with other circulatory complications: Secondary | ICD-10-CM

## 2017-10-08 DIAGNOSIS — I679 Cerebrovascular disease, unspecified: Secondary | ICD-10-CM

## 2017-10-08 DIAGNOSIS — I6330 Cerebral infarction due to thrombosis of unspecified cerebral artery: Secondary | ICD-10-CM

## 2017-10-08 DIAGNOSIS — E119 Type 2 diabetes mellitus without complications: Secondary | ICD-10-CM

## 2017-10-08 DIAGNOSIS — I6389 Other cerebral infarction: Secondary | ICD-10-CM

## 2017-10-08 HISTORY — DX: Cerebral infarction due to thrombosis of unspecified cerebral artery: I63.30

## 2017-10-08 LAB — BASIC METABOLIC PANEL
Anion gap: 11 (ref 5–15)
BUN: 10 mg/dL (ref 6–20)
CALCIUM: 8.7 mg/dL — AB (ref 8.9–10.3)
CHLORIDE: 104 mmol/L (ref 101–111)
CO2: 22 mmol/L (ref 22–32)
CREATININE: 1.11 mg/dL (ref 0.61–1.24)
GFR calc Af Amer: >60 mL/min (ref >60)
GFR calc non Af Amer: >60 mL/min (ref >60)
GLUCOSE: 216 mg/dL — AB (ref 65–99)
Potassium: 4.1 mmol/L (ref 3.5–5.1)
Sodium: 137 mmol/L (ref 135–145)

## 2017-10-08 LAB — GLUCOSE, CAPILLARY
GLUCOSE-CAPILLARY: 204 mg/dL — AB (ref 65–99)
GLUCOSE-CAPILLARY: 125 mg/dL — AB (ref 65–99)
Glucose-Capillary: 220 mg/dL — ABNORMAL HIGH (ref 65–99)
Glucose-Capillary: 210 mg/dL — ABNORMAL HIGH (ref 65–99)

## 2017-10-08 LAB — CBC
HCT: 34.3 % — ABNORMAL LOW (ref 39.0–52.0)
Hemoglobin: 10.9 g/dL — ABNORMAL LOW (ref 13.0–17.0)
MCH: 27.3 pg (ref 26.0–34.0)
MCHC: 31.8 g/dL (ref 30.0–36.0)
MCV: 86 fL (ref 78.0–100.0)
PLATELETS: 170 10*3/uL (ref 150–400)
RBC: 3.99 MIL/uL — AB (ref 4.22–5.81)
RDW: 13.8 % (ref 11.5–15.5)
WBC: 10.1 10*3/uL (ref 4.0–10.5)

## 2017-10-08 LAB — CULTURE, BLOOD (ROUTINE X 2): Special Requests: ADEQUATE

## 2017-10-08 MED ORDER — IOPAMIDOL (ISOVUE-370) INJECTION 76%
INTRAVENOUS | Status: AC
  Administered 2017-10-08: 50 mL
  Filled 2017-10-08: qty 50

## 2017-10-08 NOTE — Clinical Social Work Note (Signed)
[facility] Per MD note tentative d/c Monday after TEE. CSW continuing to follow. Sue Lushndrea is Chief Strategy Officerpt's worker with [employer] [phone]).  SiletzBridget Lyncoln Maskell, [place] [phone]

## 2017-10-08 NOTE — H&P (View-Only) (Signed)
NEUROHOSPITALISTS STROKE TEAM - DAILY PROGRESS NOTE   ADMISSION HISTORY: [name] Williams is a 62 y.o. male brought in on 10/04/2017 with unwitnessed fall and altered mental status with a history of diarrhea, fevers cough chills preceding the admission.      On admission he was tachycardic but normotensive afebrile with some tachypnea.     He was oriented x2 but difficult to arouse, which was different than his baseline.     Laboratory findings revealed a white count of 12.5, creatinine 1.34 and lactate 2.46 that resolved after IV fluid resuscitation.    Preliminary brain imaging by noncontrast CT of the head unremarkable.  Urinary tox screen was negative.    He was found to have a UTI and sepsis secondary to Klebsiella. He was recovering well with antibiotic treatment and fluid resuscitation for sepsis. An MRI of the brain was ordered as a part of altered mental status workup which was completed today and showed 2 small foci of ischemia within the right lentiform nucleus and left corona radiata along with evidence of chronic microangiopathy and evidence of old cerebellar strokes and bilateral lacunar infarcts.     Neurological consultation was placed for further recommendations.  Patient does not complain of any lateralized weakness tingling numbness.     He denies any headaches or visual symptoms.  He denies any chest pain or palpitations.   He reports of generalized weakness with worsening of right and left-sided weakness over the past 2-3 years.      Upon review of his chart, he had a pontine stroke in 2011.  Upon questioning him about the workup and etiology for that stroke, he says that it was probably related to high blood pressure.    He is a current smoker.  Denies illicit drug use.   During his hospitalization he had a workup for stroke including a 2D echocardiogram which revealed ejection fraction of 50-55%, mild left ventricular dilatation, no PFO or  atrial septal defect. LDL 77 A1c 7.7  LKW: at least 2 days ago tpa given?: no, OSW Premorbid modified Rankin scale (mRS): 0 ICH Score:  NIH stroke scale-3 (1 for dysarthria, 1 for each leg)  SUBJECTIVE (INTERVAL HISTORY) No family is at the bedside. Patient is found laying in bed in NAD. Overall he feels his condition is unchanged. Voices no new complaints. No new events reported overnight. Patient appear to have significant dementia at baseline. No family at bedside to discuss   OBJECTIVE Lab Results: CBC:  Recent Labs  Lab 10/06/17 0409 10/07/17 0715 10/08/17 0235  WBC 12.4* 10.8* 10.1  HGB 10.5* 11.2* 10.9*  HCT 33.6* 34.7* 34.3*  MCV 86.4 86.5 86.0  PLT 115* 135* 170   BMP: Recent Labs  Lab 10/04/17 1518 10/04/17 1728 10/05/17 0416 10/06/17 0409 10/07/17 0715 10/08/17 0235  NA 140  --  139 137 137 137  K 3.8  --  4.0 3.9 3.8 4.1  CL 104  --  104 104 103 104  CO2 23  --  22 24 21* 22  GLUCOSE 169*  --  134* 98 127* 216*  BUN 16  --  15 15 11 10   CREATININE 1.34* 1.38* 1.41* 1.16 1.09 1.11  CALCIUM 9.2  --  8.7* 8.4* 8.8* 8.7*  MG  --  1.3*  --   --   --   --   PHOS  --  2.4*  --   --   --   --    Liver  Function Tests:  Recent Labs  Lab 10/04/17 1518  AST 29  ALT 18  ALKPHOS 48  BILITOT 0.8  PROT 6.3*  ALBUMIN 3.3*   Cardiac Enzymes:  Recent Labs  Lab 10/04/17 1728 10/04/17 2255 10/05/17 0416 10/05/17 0917  CKTOTAL 719*  --   --   --   TROPONINI <0.03 <0.03 0.54* <0.03   Microbiology:  Urinalysis:  Recent Labs  Lab 10/04/17 1328  COLORURINE YELLOW  APPEARANCEUR CLEAR  LABSPEC 1.011  PHURINE 5.0  GLUCOSEU NEGATIVE  HGBUR NEGATIVE  BILIRUBINUR NEGATIVE  KETONESUR NEGATIVE  PROTEINUR NEGATIVE  NITRITE NEGATIVE  LEUKOCYTESUR SMALL*   Urine Drug Screen:     Component Value Date/Time   LABOPIA NONE DETECTED 10/04/2017 0703   COCAINSCRNUR NONE DETECTED 10/04/2017 0703   LABBENZ NONE DETECTED 10/04/2017 0703   AMPHETMU NONE  DETECTED 10/04/2017 0703   THCU NONE DETECTED 10/04/2017 0703   LABBARB NONE DETECTED 10/04/2017 0703    Alcohol Level:  Recent Labs  Lab 10/04/17 1728  ETH <10    PHYSICAL EXAM Temp:  [97.6 F (36.4 C)-99.8 F (37.7 C)] 99.8 F (37.7 C) (02/23 0400) Pulse Rate:  [55-81] 60 (02/23 0400) Resp:  [17-18] 17 (02/23 0400) BP: (113-161)/(53-86) 122/60 (02/23 0504) SpO2:  [96 %-100 %] 99 % (02/23 0400) General - Well nourished, well developed, in no apparent distress HEENT-  Normocephalic,    Cardiovascular - Regular rate and rhythm  Respiratory - Lungs clear bilaterally. No wheezing. Abdomen - soft and non-tender, BS normal Extremities- no edema or cyanosis Patient is awake alert oriented x3 His speech is mildly dysarthric-unclear if this is his baseline neurologist because he is edentulous. Cranial nerves: Pupils equal round reactive light, extraocular muscles intact, visual fields full, face symmetric, facial sensation intact bilaterally, auditory acuity grossly intact, palate elevates midline, tongue midline, shoulder shrug intact bilaterally. Motor exam: Upper extremities bilaterally 4/5 Lower extremities bilaterally 4-/5-if anything the right leg is stronger than the left leg mildly. Sensory exam: Intact to light touch with no extinction Coordination: Intact finger nose finger bilaterally Gait was not tested  IMAGING: I have personally reviewed the radiological images below and agree with the radiology interpretations. Ct Angio Head W Or Wo Contrast Ct Angio Neck W Or Wo Contrast Result Date: 10/08/2017 IMPRESSION: CT HEAD: 1. Known acute small infarcts better seen on prior MRI. 2. Old bilateral basal ganglia and thalami infarcts. Old RIGHT cerebellar infarct. 3. Moderate chronic small vessel ischemic disease. 4. Mild parenchymal brain volume loss. Advanced brainstem and cerebellar atrophy. CTA NECK: 1. 50% stenosis LEFT internal carotid artery. 2. Chronically occluded RIGHT  vertebral artery with minimal reconstitution distal V2 segment. CTA HEAD: 1. No emergent large vessel occlusion. 2. Moderate to severe stenosis bilateral ICAs. 3. Thready, severely stenotic RIGHT vertebral artery. Moderate stenosis proximal basilar artery. 4. Moderate cerebral artery atherosclerosis. Severe stenosis RIGHT P2 segment. Electronically Signed   By: Awilda Metroourtnay  Bloomer M.D.   On: 10/08/2017 01:18   [name] Contrast Result Date: 10/07/2017 IMPRESSION: 1. Small foci of acute ischemia within the posterior right lentiform nucleus (2-3 mm) and left corona radiata (7 mm). No hemorrhage or mass effect. 2. Chronic ischemic microangiopathy and old right cerebellar infarct. Electronically Signed   By: Deatra RobinsonKevin  Herman M.D.   On: 10/07/2017 14:03   Echocardiogram:        10/06/2017  Study Conclusions - Left ventricle: The cavity size was mildly dilated. Wall   thickness was increased in a pattern of mild LVH. Systolic   function was normal. The estimated ejection fraction was in the   range of 50% to 55%. Left ventricular diastolic function   parameters were normal. - Atrial septum: No defect or patent foramen ovale was identified.  TEE /Loop Recorder:                                        PENDING for Monday     IMPRESSION: [name] Williams is a 62 y.o. male with PMH of HTN, HLD, DM who present to hospital with UTI sepsis and AMS. MRI reveals:  Small foci of acute ischemia within the posterior right lentiform nucleus (2-3 mm) and left corona radiata (7 mm)  Suspected Etiology: likely small vessel, need to rule out embolic because of recent bacteremia Resultant Symptoms: Dysarthria and B/L leg weakness Stroke Risk Factors: diabetes mellitus, hyperlipidemia, hypertension and smoking Other Stroke Risk Factors: Advanced age, Cigarette smoker, Hx stroke, CKD  Outstanding Stroke Work-up Studies:     TEE /Loop Recorder:                                              PENDING for Monday  PLAN  10/08/2017: Continue Aspirin/ Plavix/Statin Frequent neuro checks Telemetry monitoring PT/OT/SLP Consult PM & Rehab Consult Case Management /MSW  TEE and Loop Recorder Placement for Monday Ongoing aggressive stroke risk factor management Patient's family counseled to be compliant with his antithrombotic medications Patient's family counseled on Lifestyle modifications including, Diet, Exercise, and Stress Follow up with [facility] in 6 weeks  HX OF STROKES: Prior MRI from 2011 showed a pontine stroke. CTA - Old bilateral basal ganglia and thalami infarcts. Old RIGHT cerebellar infarct  INTRACRANIAL Atherosclerosis &Stenosis: On DAPT at admission  DYSPHAGIA: Passed swallow evaluation Aspiration Precautions in progress  R/O AFIB: TEE and Possible Loop Recorder Placement for Monday  MEDICAL ISSUES: UTI Sepsis  HYPERTENSION: Stable, Avoid Hypotension Permissive hypertension (OK if <220/120) for 24-48 hours post stroke and then gradually normalized within 5-7 days. Long term BP goal normotensive. May slowly restart home B/P medications after 48 hours Home Meds: x 2  HYPERLIPIDEMIA:    Component Value Date/Time   CHOL 123 10/07/2017 0715   TRIG 119 10/07/2017 0715   HDL 20 (L) 10/07/2017 0715   CHOLHDL 6.2 10/07/2017 0715   VLDL 24 10/07/2017 0715   LDLCALC 79 10/07/2017 0715  Home Meds:  Lipitor 40 mg LDL  goal < 70 Continued on Lipitor to 80 mg daily Continue statin at discharge  DIABETES: Lab Results  Component Value Date   HGBA1C 7.7 (H) 10/04/2017  HgbA1c goal < 7.0 Currently on: Novolog Continue CBG monitoring and SSI to maintain glucose 140-180 mg/dl DM education   TOBACCO ABUSE Current smoker Smoking cessation counseling provided Nicotine patch provided  Other Active Problems: Active Problems:   Altered mental status, unspecified   Sepsis (HCC)   Acute pyelonephritis   Dizziness   Hypertension    Chronic systolic congestive heart failure (HCC)   Elevated troponin    Hospital day # 4 VTE prophylaxis:  Heparin  Diet : Diet NPO time specified   FAMILY  UPDATES: No family at bedside  TEAM UPDATES: Carney Living, MD   Prior Home Stroke Medications:  aspirin 81 mg daily and clopidogrel 75 mg daily  Discharge Stroke Meds:  Please discharge patient on ASA 325 mg daily and Plavix 75 mg daily for now.  If TEE with positive findings or AFIB noted on telemetry monitoring, then reconsult Neurology for [facility] therapy plan  Disposition: 06-Home-Health Care Svc Therapy Recs:               PENDING Follow Up:  Follow-up Information    Nilda Riggs, NP. Schedule an appointment as soon as possible for a visit in 6 week(s).   Specialty:  Family Medicine Contact information: [address] [place] [address] [phone]          Annie Sable, NP -PCP Follow up in 1-2 weeks      Assessment & plan discussed with with attending physician and they are in agreement.    Brita Romp Stroke Neurology Team 10/08/2017 7:40 AM  ATTENDING ASSESSMENT: I reviewed above note and agree with the assessment and plan. I have made any additions or clarifications directly to the above note. Pt was seen and examined.   62 year old male with history of diabetes, hypertension, smoker, marijuana abuse, schizophrenia had pontine infarct in 2011 and DWI negative stroke in 06/2014 with right INO and left gait nystagmus.  MRI in 06/2014 showed no acute stroke but old right cerebellum, and pontine infarcts.  MRI showed diffuse intracranial stenosis.  Carotid Doppler left ICA 40-79% stenosis, EF 40-50%, LDL 78 and A1c 6.4.  He was put on DAPT.   This time patient was admitted for falling at home, altered mental status, tachycardia, diarrhea, fever with T-max 101.3, cough, chills.  CT no acute abnormality.  UDS negative.  UTI showed Klebsiella infection.  Blood culture  also showed positive for Klebsiella, and he was currently treated for sepsis with Keflex.  MRI showed right BG punctate and left CR small infarcts.  CTA head and neck left ICA 50% stenosis right VA occluded, moderate to severe stenosis bilateral ICA siphons and right P2.  LDL 79 and A1c 7.7.  EF 50-55%.  Although the location of strokes consistent with small vessel disease given significance and stroke risk factors, his sepsis with bacteremia also raises question of endocarditis.  Agree with TEE on Monday to rule out endocarditis.  Currently, patient on aspirin 325, Plavix and Lipitor 80 for maximized medical treatment for intracranial stenosis. Will follow.  Marvel Plan, MD PhD Stroke Neurology 10/08/2017 4:53 PM  To contact Stroke Continuity provider, please refer to [email or web address]. After hours, contact General Neurology

## 2017-10-08 NOTE — Progress Notes (Addendum)
 NEUROHOSPITALISTS STROKE TEAM - DAILY PROGRESS NOTE   ADMISSION HISTORY: [name] Williams is a 61 y.o. male brought in on 10/04/2017 with unwitnessed fall and altered mental status with a history of diarrhea, fevers cough chills preceding the admission.      On admission he was tachycardic but normotensive afebrile with some tachypnea.     He was oriented x2 but difficult to arouse, which was different than his baseline.     Laboratory findings revealed a white count of 12.5, creatinine 1.34 and lactate 2.46 that resolved after IV fluid resuscitation.    Preliminary brain imaging by noncontrast CT of the head unremarkable.  Urinary tox screen was negative.    He was found to have a UTI and sepsis secondary to Klebsiella. He was recovering well with antibiotic treatment and fluid resuscitation for sepsis. An MRI of the brain was ordered as a part of altered mental status workup which was completed today and showed 2 small foci of ischemia within the right lentiform nucleus and left corona radiata along with evidence of chronic microangiopathy and evidence of old cerebellar strokes and bilateral lacunar infarcts.     Neurological consultation was placed for further recommendations.  Patient does not complain of any lateralized weakness tingling numbness.     He denies any headaches or visual symptoms.  He denies any chest pain or palpitations.   He reports of generalized weakness with worsening of right and left-sided weakness over the past 2-3 years.      Upon review of his chart, he had a pontine stroke in 2011.  Upon questioning him about the workup and etiology for that stroke, he says that it was probably related to high blood pressure.    He is a current smoker.  Denies illicit drug use.   During his hospitalization he had a workup for stroke including a 2D echocardiogram which revealed ejection fraction of 50-55%, mild left ventricular dilatation, no PFO or  atrial septal defect. LDL 77 A1c 7.7  LKW: at least 2 days ago tpa given?: no, OSW Premorbid modified Rankin scale (mRS): 0 ICH Score:  NIH stroke scale-3 (1 for dysarthria, 1 for each leg)  SUBJECTIVE (INTERVAL HISTORY) No family is at the bedside. Patient is found laying in bed in NAD. Overall he feels his condition is unchanged. Voices no new complaints. No new events reported overnight. Patient appear to have significant dementia at baseline. No family at bedside to discuss   OBJECTIVE Lab Results: CBC:  Recent Labs  Lab 10/06/17 0409 10/07/17 0715 10/08/17 0235  WBC 12.4* 10.8* 10.1  HGB 10.5* 11.2* 10.9*  HCT 33.6* 34.7* 34.3*  MCV 86.4 86.5 86.0  PLT 115* 135* 170   BMP: Recent Labs  Lab 10/04/17 1518 10/04/17 1728 10/05/17 0416 10/06/17 0409 10/07/17 0715 10/08/17 0235  NA 140  --  139 137 137 137  K 3.8  --  4.0 3.9 3.8 4.1  CL 104  --  104 104 103 104  CO2 23  --  22 24 21* 22  GLUCOSE 169*  --  134* 98 127* 216*  BUN 16  --  15 15 11 10  CREATININE 1.34* 1.38* 1.41* 1.16 1.09 1.11  CALCIUM 9.2  --  8.7* 8.4* 8.8* 8.7*  MG  --  1.3*  --   --   --   --   PHOS  --  2.4*  --   --   --   --    Liver   Function Tests:  Recent Labs  Lab 10/04/17 1518  AST 29  ALT 18  ALKPHOS 48  BILITOT 0.8  PROT 6.3*  ALBUMIN 3.3*   Cardiac Enzymes:  Recent Labs  Lab 10/04/17 1728 10/04/17 2255 10/05/17 0416 10/05/17 0917  CKTOTAL 719*  --   --   --   TROPONINI <0.03 <0.03 0.54* <0.03   Microbiology:  Urinalysis:  Recent Labs  Lab 10/04/17 1328  COLORURINE YELLOW  APPEARANCEUR CLEAR  LABSPEC 1.011  PHURINE 5.0  GLUCOSEU NEGATIVE  HGBUR NEGATIVE  BILIRUBINUR NEGATIVE  KETONESUR NEGATIVE  PROTEINUR NEGATIVE  NITRITE NEGATIVE  LEUKOCYTESUR SMALL*   Urine Drug Screen:     Component Value Date/Time   LABOPIA NONE DETECTED 10/04/2017 0703   COCAINSCRNUR NONE DETECTED 10/04/2017 0703   LABBENZ NONE DETECTED 10/04/2017 0703   AMPHETMU NONE  DETECTED 10/04/2017 0703   THCU NONE DETECTED 10/04/2017 0703   LABBARB NONE DETECTED 10/04/2017 0703    Alcohol Level:  Recent Labs  Lab 10/04/17 1728  ETH <10    PHYSICAL EXAM Temp:  [97.6 F (36.4 C)-99.8 F (37.7 C)] 99.8 F (37.7 C) (02/23 0400) Pulse Rate:  [55-81] 60 (02/23 0400) Resp:  [17-18] 17 (02/23 0400) BP: (113-161)/(53-86) 122/60 (02/23 0504) SpO2:  [96 %-100 %] 99 % (02/23 0400) General - Well nourished, well developed, in no apparent distress HEENT-  Normocephalic,    Cardiovascular - Regular rate and rhythm  Respiratory - Lungs clear bilaterally. No wheezing. Abdomen - soft and non-tender, BS normal Extremities- no edema or cyanosis Patient is awake alert oriented x3 His speech is mildly dysarthric-unclear if this is his baseline neurologist because he is edentulous. Cranial nerves: Pupils equal round reactive light, extraocular muscles intact, visual fields full, face symmetric, facial sensation intact bilaterally, auditory acuity grossly intact, palate elevates midline, tongue midline, shoulder shrug intact bilaterally. Motor exam: Upper extremities bilaterally 4/5 Lower extremities bilaterally 4-/5-if anything the right leg is stronger than the left leg mildly. Sensory exam: Intact to light touch with no extinction Coordination: Intact finger nose finger bilaterally Gait was not tested  IMAGING: I have personally reviewed the radiological images below and agree with the radiology interpretations. Ct Angio Head W Or Wo Contrast Ct Angio Neck W Or Wo Contrast Result Date: 10/08/2017 IMPRESSION: CT HEAD: 1. Known acute small infarcts better seen on prior MRI. 2. Old bilateral basal ganglia and thalami infarcts. Old RIGHT cerebellar infarct. 3. Moderate chronic small vessel ischemic disease. 4. Mild parenchymal brain volume loss. Advanced brainstem and cerebellar atrophy. CTA NECK: 1. 50% stenosis LEFT internal carotid artery. 2. Chronically occluded RIGHT  vertebral artery with minimal reconstitution distal V2 segment. CTA HEAD: 1. No emergent large vessel occlusion. 2. Moderate to severe stenosis bilateral ICAs. 3. Thready, severely stenotic RIGHT vertebral artery. Moderate stenosis proximal basilar artery. 4. Moderate cerebral artery atherosclerosis. Severe stenosis RIGHT P2 segment. Electronically Signed   By: Courtnay  Bloomer M.D.   On: 10/08/2017 01:18   [name] Contrast Result Date: 10/07/2017 IMPRESSION: 1. Small foci of acute ischemia within the posterior right lentiform nucleus (2-3 mm) and left corona radiata (7 mm). No hemorrhage or mass effect. 2. Chronic ischemic microangiopathy and old right cerebellar infarct. Electronically Signed   By: Kevin  Herman M.D.   On: 10/07/2017 14:03   Echocardiogram:        10/06/2017                                         Study Conclusions - Left ventricle: The cavity size was mildly dilated. Wall   thickness was increased in a pattern of mild LVH. Systolic   function was normal. The estimated ejection fraction was in the   range of 50% to 55%. Left ventricular diastolic function   parameters were normal. - Atrial septum: No defect or patent foramen ovale was identified.  TEE /Loop Recorder:                                        PENDING for Monday     IMPRESSION: [name] is a 61 y.o. male with PMH of HTN, HLD, DM who present to hospital with UTI sepsis and AMS. MRI reveals:  Small foci of acute ischemia within the posterior right lentiform nucleus (2-3 mm) and left corona radiata (7 mm)  Suspected Etiology: likely small vessel, need to rule out embolic because of recent bacteremia Resultant Symptoms: Dysarthria and B/L leg weakness Stroke Risk Factors: diabetes mellitus, hyperlipidemia, hypertension and smoking Other Stroke Risk Factors: Advanced age, Cigarette smoker, Hx stroke, CKD  Outstanding Stroke Work-up Studies:     TEE /Loop Recorder:                                              PENDING for Monday  PLAN  10/08/2017: Continue Aspirin/ Plavix/Statin Frequent neuro checks Telemetry monitoring PT/OT/SLP Consult PM & Rehab Consult Case Management /MSW  TEE and Loop Recorder Placement for Monday Ongoing aggressive stroke risk factor management Patient's family counseled to be compliant with his antithrombotic medications Patient's family counseled on Lifestyle modifications including, Diet, Exercise, and Stress Follow up with [facility] in 6 weeks  HX OF STROKES: Prior MRI from 2011 showed a pontine stroke. CTA - Old bilateral basal ganglia and thalami infarcts. Old RIGHT cerebellar infarct  INTRACRANIAL Atherosclerosis &Stenosis: On DAPT at admission  DYSPHAGIA: Passed swallow evaluation Aspiration Precautions in progress  R/O AFIB: TEE and Possible Loop Recorder Placement for Monday  MEDICAL ISSUES: UTI Sepsis  HYPERTENSION: Stable, Avoid Hypotension Permissive hypertension (OK if <220/120) for 24-48 hours post stroke and then gradually normalized within 5-7 days. Long term BP goal normotensive. May slowly restart home B/P medications after 48 hours Home Meds: x 2  HYPERLIPIDEMIA:    Component Value Date/Time   CHOL 123 10/07/2017 0715   TRIG 119 10/07/2017 0715   HDL 20 (L) 10/07/2017 0715   CHOLHDL 6.2 10/07/2017 0715   VLDL 24 10/07/2017 0715   LDLCALC 79 10/07/2017 0715  Home Meds:  Lipitor 40 mg LDL  goal < 70 Continued on Lipitor to 80 mg daily Continue statin at discharge  DIABETES: Lab Results  Component Value Date   HGBA1C 7.7 (H) 10/04/2017  HgbA1c goal < 7.0 Currently on: Novolog Continue CBG monitoring and SSI to maintain glucose 140-180 mg/dl DM education   TOBACCO ABUSE Current smoker Smoking cessation counseling provided Nicotine patch provided  Other Active Problems: Active Problems:   Altered mental status, unspecified   Sepsis (HCC)   Acute pyelonephritis   Dizziness   Hypertension    Chronic systolic congestive heart failure (HCC)   Elevated troponin    Hospital day # 4 VTE prophylaxis:  Heparin  Diet : Diet NPO time specified   FAMILY   UPDATES: No family at bedside  TEAM UPDATES: Chambliss, Marshall L, MD   Prior Home Stroke Medications:  aspirin 81 mg daily and clopidogrel 75 mg daily  Discharge Stroke Meds:  Please discharge patient on ASA 325 mg daily and Plavix 75 mg daily for now.  If TEE with positive findings or AFIB noted on telemetry monitoring, then reconsult Neurology for AC therapy plan  Disposition: 06-Home-Health Care Svc Therapy Recs:               PENDING Follow Up:  Follow-up Information    Martin, Nancy Carolyn, NP. Schedule an appointment as soon as possible for a visit in 6 week(s).   Specialty:  Family Medicine Contact information: [address] [place] [address] [phone]          Kohler, Patricia Lynn, NP -PCP Follow up in 1-2 weeks      Assessment & plan discussed with with attending physician and they are in agreement.    Mary A Costello, ANP-C Stroke Neurology Team 10/08/2017 7:40 AM  ATTENDING ASSESSMENT: I reviewed above note and agree with the assessment and plan. I have made any additions or clarifications directly to the above note. Pt was seen and examined.   61-year-old male with history of diabetes, hypertension, smoker, marijuana abuse, schizophrenia had pontine infarct in 2011 and DWI negative stroke in 06/2014 with right INO and left gait nystagmus.  MRI in 06/2014 showed no acute stroke but old right cerebellum, and pontine infarcts.  MRI showed diffuse intracranial stenosis.  Carotid Doppler left ICA 40-79% stenosis, EF 40-50%, LDL 78 and A1c 6.4.  He was put on DAPT.   This time patient was admitted for falling at home, altered mental status, tachycardia, diarrhea, fever with T-max 101.3, cough, chills.  CT no acute abnormality.  UDS negative.  UTI showed Klebsiella infection.  Blood culture  also showed positive for Klebsiella, and he was currently treated for sepsis with Keflex.  MRI showed right BG punctate and left CR small infarcts.  CTA head and neck left ICA 50% stenosis right VA occluded, moderate to severe stenosis bilateral ICA siphons and right P2.  LDL 79 and A1c 7.7.  EF 50-55%.  Although the location of strokes consistent with small vessel disease given significance and stroke risk factors, his sepsis with bacteremia also raises question of endocarditis.  Agree with TEE on Monday to rule out endocarditis.  Currently, patient on aspirin 325, Plavix and Lipitor 80 for maximized medical treatment for intracranial stenosis. Will follow.  Amarius Toto, MD PhD Stroke Neurology 10/08/2017 4:53 PM  To contact Stroke Continuity provider, please refer to [email or web address]. After hours, contact General Neurology   

## 2017-10-09 LAB — BASIC METABOLIC PANEL
Anion gap: 10 (ref 5–15)
BUN: 9 mg/dL (ref 6–20)
CHLORIDE: 104 mmol/L (ref 101–111)
CO2: 24 mmol/L (ref 22–32)
CREATININE: 1.03 mg/dL (ref 0.61–1.24)
Calcium: 9 mg/dL (ref 8.9–10.3)
GFR calc non Af Amer: >60 mL/min (ref >60)
Glucose, Bld: 126 mg/dL — ABNORMAL HIGH (ref 65–99)
Potassium: 4.2 mmol/L (ref 3.5–5.1)
Sodium: 138 mmol/L (ref 135–145)

## 2017-10-09 LAB — GLUCOSE, CAPILLARY
GLUCOSE-CAPILLARY: 169 mg/dL — AB (ref 65–99)
GLUCOSE-CAPILLARY: 254 mg/dL — AB (ref 65–99)
GLUCOSE-CAPILLARY: 234 mg/dL — AB (ref 65–99)
Glucose-Capillary: 145 mg/dL — ABNORMAL HIGH (ref 65–99)

## 2017-10-09 LAB — CBC
HCT: 35.5 % — ABNORMAL LOW (ref 39.0–52.0)
HEMOGLOBIN: 11.4 g/dL — AB (ref 13.0–17.0)
MCH: 27.9 pg (ref 26.0–34.0)
MCHC: 32.1 g/dL (ref 30.0–36.0)
MCV: 86.8 fL (ref 78.0–100.0)
Platelets: 203 10*3/uL (ref 150–400)
RBC: 4.09 MIL/uL — ABNORMAL LOW (ref 4.22–5.81)
RDW: 14 % (ref 11.5–15.5)
WBC: 12.3 10*3/uL — ABNORMAL HIGH (ref 4.0–10.5)

## 2017-10-09 NOTE — Progress Notes (Addendum)
NEUROHOSPITALISTS STROKE TEAM - DAILY PROGRESS NOTE   SUBJECTIVE (INTERVAL HISTORY) No family again this AM at the bedside. Patient is found laying in bed in NAD. Overall he feels his condition is slowly improving. His overall strength has improved. Able to lift all extremities off the bed. Voices no new complaints. No new events reported overnight.     OBJECTIVE Lab Results: CBC:  Recent Labs  Lab 10/07/17 0715 10/08/17 0235 10/09/17 0519  WBC 10.8* 10.1 12.3*  HGB 11.2* 10.9* 11.4*  HCT 34.7* 34.3* 35.5*  MCV 86.5 86.0 86.8  PLT 135* 170 203   BMP: Recent Labs  Lab 10/04/17 1728 10/05/17 0416 10/06/17 0409 10/07/17 0715 10/08/17 0235 10/09/17 0519  NA  --  139 137 137 137 138  K  --  4.0 3.9 3.8 4.1 4.2  CL  --  104 104 103 104 104  CO2  --  22 24 21* 22 24  GLUCOSE  --  134* 98 127* 216* 126*  BUN  --  15 15 11 10 9   CREATININE 1.38* 1.41* 1.16 1.09 1.11 1.03  CALCIUM  --  8.7* 8.4* 8.8* 8.7* 9.0  MG 1.3*  --   --   --   --   --   PHOS 2.4*  --   --   --   --   --    Liver Function Tests:  Recent Labs  Lab 10/04/17 1518  AST 29  ALT 18  ALKPHOS 48  BILITOT 0.8  PROT 6.3*  ALBUMIN 3.3*   Cardiac Enzymes:  Recent Labs  Lab 10/04/17 1728 10/04/17 2255 10/05/17 0416 10/05/17 0917  CKTOTAL 719*  --   --   --   TROPONINI <0.03 <0.03 0.54* <0.03   Microbiology:  Urinalysis:  Recent Labs  Lab 10/04/17 1328  COLORURINE YELLOW  APPEARANCEUR CLEAR  LABSPEC 1.011  PHURINE 5.0  GLUCOSEU NEGATIVE  HGBUR NEGATIVE  BILIRUBINUR NEGATIVE  KETONESUR NEGATIVE  PROTEINUR NEGATIVE  NITRITE NEGATIVE  LEUKOCYTESUR SMALL*   Urine Drug Screen:     Component Value Date/Time   LABOPIA NONE DETECTED 10/04/2017 0703   COCAINSCRNUR NONE DETECTED 10/04/2017 0703   LABBENZ NONE DETECTED 10/04/2017 0703   AMPHETMU NONE DETECTED 10/04/2017 0703   THCU NONE DETECTED 10/04/2017 0703   LABBARB NONE DETECTED  10/04/2017 0703    Alcohol Level:  Recent Labs  Lab 10/04/17 1728  ETH <10   PHYSICAL EXAM Temp:  [97.9 F (36.6 C)-98.6 F (37 C)] 97.9 F (36.6 C) (02/24 0900) Pulse Rate:  [47-79] 60 (02/24 0900) Resp:  [16-18] 17 (02/24 0900) BP: (134-150)/(60-78) 142/68 (02/24 0900) SpO2:  [97 %-99 %] 97 % (02/24 0900) General - Well nourished, well developed, in no apparent distress HEENT-  Normocephalic,    Cardiovascular - Regular rate and rhythm  Respiratory - Lungs clear bilaterally. No wheezing. Abdomen - soft and non-tender, BS normal Extremities- no edema or cyanosis Patient is awake alert oriented x3 His speech is mildly dysarthric-unclear if this is his baseline neurologist because he is edentulous. Cranial nerves: Pupils equal round reactive light, extraocular muscles intact, visual fields full, face symmetric, facial sensation intact bilaterally, auditory acuity grossly intact, palate elevates midline, tongue midline, shoulder shrug intact bilaterally. Motor exam: Upper extremities bilaterally 4+/5 Lower extremities bilaterally 4+/5-if anything the right leg is stronger than the left leg mildly. Sensory exam: Intact to light touch with no extinction Coordination: Intact finger nose finger bilaterally Gait was not tested  IMAGING: I  have personally reviewed the radiological images below and agree with the radiology interpretations. Ct Angio Head W Or Williams Contrast Ct Angio Neck W Or Williams Contrast Result Date: 10/08/2017 IMPRESSION: CT HEAD: 1. Known acute small infarcts better seen on prior MRI. 2. Old bilateral basal ganglia and thalami infarcts. Old RIGHT cerebellar infarct. 3. Moderate chronic small vessel ischemic disease. 4. Mild parenchymal brain volume loss. Advanced brainstem and cerebellar atrophy. CTA NECK: 1. 50% stenosis LEFT internal carotid artery. 2. Chronically occluded RIGHT vertebral artery with minimal reconstitution distal V2 segment. CTA HEAD: 1. No emergent large  vessel occlusion. 2. Moderate to severe stenosis bilateral ICAs. 3. Thready, severely stenotic RIGHT vertebral artery. Moderate stenosis proximal basilar artery. 4. Moderate cerebral artery atherosclerosis. Severe stenosis RIGHT P2 segment. Electronically Signed   By: Awilda Metro M.D.   On: 10/08/2017 01:18   [name] Williams Contrast Result Date: 10/07/2017 IMPRESSION: 1. Small foci of acute ischemia within the posterior right lentiform nucleus (2-3 mm) and left corona radiata (7 mm). No hemorrhage or mass effect. 2. Chronic ischemic microangiopathy and old right cerebellar infarct. Electronically Signed   By: Deatra Robinson M.D.   On: 10/07/2017 14:03   Echocardiogram:        10/06/2017                                       Study Conclusions - Left ventricle: The cavity size was mildly dilated. Wall   thickness was increased in a pattern of mild LVH. Systolic   function was normal. The estimated ejection fraction was in the   range of 50% to 55%. Left ventricular diastolic function   parameters were normal. - Atrial septum: No defect or patent foramen ovale was identified.  TEE:                                        PENDING for Monday     IMPRESSION: [name]. [name] Williams is a 62 y.o. male with PMH of HTN, HLD, DM who present to hospital with UTI sepsis and AMS. MRI reveals:  Small foci of acute ischemia within the posterior right lentiform nucleus (2-3 mm) and left corona radiata (7 mm)  Suspected Etiology: likely small vessel, need to rule out embolic because of recent bacteremia Resultant Symptoms: Dysarthria and B/L leg weakness Stroke Risk Factors: diabetes mellitus, hyperlipidemia, hypertension and smoking Other Stroke Risk Factors: Advanced age, Cigarette smoker, Hx stroke, CKD  Outstanding Stroke Work-up Studies:     TEE                                            PENDING for Monday  PLAN  10/09/2017: Continue Aspirin/ Plavix/Statin Frequent neuro checks Telemetry  monitoring PT/OT/SLP Consult Case Management /MSW  TEE and Loop Recorder Placement for Monday Ongoing aggressive stroke risk factor management Patient's family counseled to be compliant with his antithrombotic medications Patient's family counseled on Lifestyle modifications including, Diet, Exercise, and Stress Follow up with [facility] in 6 weeks  HX OF STROKES: Prior MRI from 2011 showed a pontine stroke. CTA - Old bilateral basal ganglia and thalami infarcts. Old RIGHT cerebellar infarct  INTRACRANIAL Atherosclerosis &Stenosis:  On DAPT at admission  DYSPHAGIA: Passed swallow evaluation Aspiration Precautions in progress  Rule out endocarditis. : TEE  for Monday  MEDICAL ISSUES: UTI Sepsis  HYPERTENSION: Stable, Avoid Hypotension Permissive hypertension (OK if <220/120) for 24-48 hours post stroke and then gradually normalized within 5-7 days. Long term BP goal normotensive. May slowly restart home B/P medications after 48 hours Home Meds: x 2  HYPERLIPIDEMIA:    Component Value Date/Time   CHOL 123 10/07/2017 0715   TRIG 119 10/07/2017 0715   HDL 20 (L) 10/07/2017 0715   CHOLHDL 6.2 10/07/2017 0715   VLDL 24 10/07/2017 0715   LDLCALC 79 10/07/2017 0715  Home Meds:  Lipitor 40 mg LDL  goal < 70 Continued on Lipitor to 80 mg daily Continue statin at discharge  DIABETES: Lab Results  Component Value Date   HGBA1C 7.7 (H) 10/04/2017  HgbA1c goal < 7.0 Currently on: Novolog Continue CBG monitoring and SSI to maintain glucose 140-180 mg/dl DM education   TOBACCO ABUSE Current smoker Smoking cessation counseling provided Nicotine patch provided  Other Active Problems: Active Problems:   History of stroke   Intracranial vascular stenosis   Altered mental status, unspecified   Sepsis (HCC)   Acute pyelonephritis   Dizziness   Hypertension   Chronic systolic congestive heart failure (HCC)   Elevated troponin   Cerebral thrombosis with  cerebral infarction   Diabetes mellitus [facility])    Hospital day # 5 VTE prophylaxis:  Heparin  Diet : Diet heart healthy/carb modified Room service appropriate? Yes; Fluid consistency: Thin Diet NPO time specified   FAMILY UPDATES: No family at bedside  TEAM UPDATES: Carney Living, MD   Prior Home Stroke Medications:  aspirin 81 mg daily and clopidogrel 75 mg daily  Discharge Stroke Meds:  Please discharge patient on ASA 325 mg daily and Plavix 75 mg daily for now.   Disposition: 06-Home-Health Care Svc Therapy Recs:               SNF Follow Up:  Follow-up Information    Nilda Riggs, NP. Schedule an appointment as soon as possible for a visit in 6 week(s).   Specialty:  Family Medicine Contact information: [address] [place] [address] [phone]          Annie Sable, NP -PCP Follow up in 1-2 weeks      Assessment & plan discussed with with attending physician and they are in agreement.    Beryl Meager, ANP-C Stroke Neurology Team 10/09/2017 11:01 AM  10/08/2017  ATTENDING ASSESSMENT: I reviewed above note and agree with the assessment and plan. I have made any additions or clarifications directly to the above note. Pt was seen and examined.   62 year old male with history of diabetes, hypertension, smoker, marijuana abuse, schizophrenia had pontine infarct in 2011 and DWI negative stroke in 06/2014 with right INO and left gait nystagmus.  MRI in 06/2014 showed no acute stroke but old right cerebellum, and pontine infarcts.  MRI showed diffuse intracranial stenosis.  Carotid Doppler left ICA 40-79% stenosis, EF 40-50%, LDL 78 and A1c 6.4.  He was put on DAPT.   This time patient was admitted for falling at home, altered mental status, tachycardia, diarrhea, fever with T-max 101.3, cough, chills.  CT no acute abnormality.  UDS negative.  UTI showed Klebsiella infection.  Blood culture also showed positive for Klebsiella, and  he was currently treated for sepsis with Keflex.  MRI showed right  BG punctate and left CR small infarcts.  CTA head and neck left ICA 50% stenosis right VA occluded, moderate to severe stenosis bilateral ICA siphons and right P2.  LDL 79 and A1c 7.7.  EF 50-55%.  Although the location of strokes consistent with small vessel disease given significance and stroke risk factors, his sepsis with bacteremia also raises question of endocarditis.  Agree with TEE on Monday to rule out endocarditis.  Currently, patient on aspirin 325, Plavix and Lipitor 80 for maximized medical treatment for intracranial stenosis. Will follow.  10/09/2017  ATTENDING ASSESSMENT: No acute event overnight.  Afebrile, BP stable.  Still has leukocytosis.  Pending TEE on Monday.  Continue dual antiplatelet and statin. Will follow    Marvel PlanJindong Anastassia Noack, MD PhD Stroke Neurology 10/09/2017 11:01 AM  To contact Stroke Continuity provider, please refer to [email or web address]. After hours, contact General Neurology

## 2017-10-09 NOTE — H&P (View-Only) (Signed)
NEUROHOSPITALISTS STROKE TEAM - DAILY PROGRESS NOTE   SUBJECTIVE (INTERVAL HISTORY) No family again this AM at the bedside. Patient is found laying in bed in NAD. Overall he feels his condition is slowly improving. His overall strength has improved. Able to lift all extremities off the bed. Voices no new complaints. No new events reported overnight.     OBJECTIVE Lab Results: CBC:  Recent Labs  Lab 10/07/17 0715 10/08/17 0235 10/09/17 0519  WBC 10.8* 10.1 12.3*  HGB 11.2* 10.9* 11.4*  HCT 34.7* 34.3* 35.5*  MCV 86.5 86.0 86.8  PLT 135* 170 203   BMP: Recent Labs  Lab 10/04/17 1728 10/05/17 0416 10/06/17 0409 10/07/17 0715 10/08/17 0235 10/09/17 0519  NA  --  139 137 137 137 138  K  --  4.0 3.9 3.8 4.1 4.2  CL  --  104 104 103 104 104  CO2  --  22 24 21* 22 24  GLUCOSE  --  134* 98 127* 216* 126*  BUN  --  15 15 11 10 9   CREATININE 1.38* 1.41* 1.16 1.09 1.11 1.03  CALCIUM  --  8.7* 8.4* 8.8* 8.7* 9.0  MG 1.3*  --   --   --   --   --   PHOS 2.4*  --   --   --   --   --    Liver Function Tests:  Recent Labs  Lab 10/04/17 1518  AST 29  ALT 18  ALKPHOS 48  BILITOT 0.8  PROT 6.3*  ALBUMIN 3.3*   Cardiac Enzymes:  Recent Labs  Lab 10/04/17 1728 10/04/17 2255 10/05/17 0416 10/05/17 0917  CKTOTAL 719*  --   --   --   TROPONINI <0.03 <0.03 0.54* <0.03   Microbiology:  Urinalysis:  Recent Labs  Lab 10/04/17 1328  COLORURINE YELLOW  APPEARANCEUR CLEAR  LABSPEC 1.011  PHURINE 5.0  GLUCOSEU NEGATIVE  HGBUR NEGATIVE  BILIRUBINUR NEGATIVE  KETONESUR NEGATIVE  PROTEINUR NEGATIVE  NITRITE NEGATIVE  LEUKOCYTESUR SMALL*   Urine Drug Screen:     Component Value Date/Time   LABOPIA NONE DETECTED 10/04/2017 0703   COCAINSCRNUR NONE DETECTED 10/04/2017 0703   LABBENZ NONE DETECTED 10/04/2017 0703   AMPHETMU NONE DETECTED 10/04/2017 0703   THCU NONE DETECTED 10/04/2017 0703   LABBARB NONE DETECTED  10/04/2017 0703    Alcohol Level:  Recent Labs  Lab 10/04/17 1728  ETH <10   PHYSICAL EXAM Temp:  [97.9 F (36.6 C)-98.6 F (37 C)] 97.9 F (36.6 C) (02/24 0900) Pulse Rate:  [47-79] 60 (02/24 0900) Resp:  [16-18] 17 (02/24 0900) BP: (134-150)/(60-78) 142/68 (02/24 0900) SpO2:  [97 %-99 %] 97 % (02/24 0900) General - Well nourished, well developed, in no apparent distress HEENT-  Normocephalic,    Cardiovascular - Regular rate and rhythm  Respiratory - Lungs clear bilaterally. No wheezing. Abdomen - soft and non-tender, BS normal Extremities- no edema or cyanosis Patient is awake alert oriented x3 His speech is mildly dysarthric-unclear if this is his baseline neurologist because he is edentulous. Cranial nerves: Pupils equal round reactive light, extraocular muscles intact, visual fields full, face symmetric, facial sensation intact bilaterally, auditory acuity grossly intact, palate elevates midline, tongue midline, shoulder shrug intact bilaterally. Motor exam: Upper extremities bilaterally 4+/5 Lower extremities bilaterally 4+/5-if anything the right leg is stronger than the left leg mildly. Sensory exam: Intact to light touch with no extinction Coordination: Intact finger nose finger bilaterally Gait was not tested  IMAGING: I  have personally reviewed the radiological images below and agree with the radiology interpretations. Ct Angio Head W Or Wo Contrast Ct Angio Neck W Or Wo Contrast Result Date: 10/08/2017 IMPRESSION: CT HEAD: 1. Known acute small infarcts better seen on prior MRI. 2. Old bilateral basal ganglia and thalami infarcts. Old RIGHT cerebellar infarct. 3. Moderate chronic small vessel ischemic disease. 4. Mild parenchymal brain volume loss. Advanced brainstem and cerebellar atrophy. CTA NECK: 1. 50% stenosis LEFT internal carotid artery. 2. Chronically occluded RIGHT vertebral artery with minimal reconstitution distal V2 segment. CTA HEAD: 1. No emergent large  vessel occlusion. 2. Moderate to severe stenosis bilateral ICAs. 3. Thready, severely stenotic RIGHT vertebral artery. Moderate stenosis proximal basilar artery. 4. Moderate cerebral artery atherosclerosis. Severe stenosis RIGHT P2 segment. Electronically Signed   By: Awilda Metro M.D.   On: 10/08/2017 01:18   [name] Contrast Result Date: 10/07/2017 IMPRESSION: 1. Small foci of acute ischemia within the posterior right lentiform nucleus (2-3 mm) and left corona radiata (7 mm). No hemorrhage or mass effect. 2. Chronic ischemic microangiopathy and old right cerebellar infarct. Electronically Signed   By: Deatra Robinson M.D.   On: 10/07/2017 14:03   Echocardiogram:        10/06/2017                                       Study Conclusions - Left ventricle: The cavity size was mildly dilated. Wall   thickness was increased in a pattern of mild LVH. Systolic   function was normal. The estimated ejection fraction was in the   range of 50% to 55%. Left ventricular diastolic function   parameters were normal. - Atrial septum: No defect or patent foramen ovale was identified.  TEE:                                        PENDING for Monday     IMPRESSION: [name] Williams is a 62 y.o. male with PMH of HTN, HLD, DM who present to hospital with UTI sepsis and AMS. MRI reveals:  Small foci of acute ischemia within the posterior right lentiform nucleus (2-3 mm) and left corona radiata (7 mm)  Suspected Etiology: likely small vessel, need to rule out embolic because of recent bacteremia Resultant Symptoms: Dysarthria and B/L leg weakness Stroke Risk Factors: diabetes mellitus, hyperlipidemia, hypertension and smoking Other Stroke Risk Factors: Advanced age, Cigarette smoker, Hx stroke, CKD  Outstanding Stroke Work-up Studies:     TEE                                            PENDING for Monday  PLAN  10/09/2017: Continue Aspirin/ Plavix/Statin Frequent neuro checks Telemetry  monitoring PT/OT/SLP Consult Case Management /MSW  TEE and Loop Recorder Placement for Monday Ongoing aggressive stroke risk factor management Patient's family counseled to be compliant with his antithrombotic medications Patient's family counseled on Lifestyle modifications including, Diet, Exercise, and Stress Follow up with [facility] in 6 weeks  HX OF STROKES: Prior MRI from 2011 showed a pontine stroke. CTA - Old bilateral basal ganglia and thalami infarcts. Old RIGHT cerebellar infarct  INTRACRANIAL Atherosclerosis &Stenosis:  On DAPT at admission  DYSPHAGIA: Passed swallow evaluation Aspiration Precautions in progress  Rule out endocarditis. : TEE  for Monday  MEDICAL ISSUES: UTI Sepsis  HYPERTENSION: Stable, Avoid Hypotension Permissive hypertension (OK if <220/120) for 24-48 hours post stroke and then gradually normalized within 5-7 days. Long term BP goal normotensive. May slowly restart home B/P medications after 48 hours Home Meds: x 2  HYPERLIPIDEMIA:    Component Value Date/Time   CHOL 123 10/07/2017 0715   TRIG 119 10/07/2017 0715   HDL 20 (L) 10/07/2017 0715   CHOLHDL 6.2 10/07/2017 0715   VLDL 24 10/07/2017 0715   LDLCALC 79 10/07/2017 0715  Home Meds:  Lipitor 40 mg LDL  goal < 70 Continued on Lipitor to 80 mg daily Continue statin at discharge  DIABETES: Lab Results  Component Value Date   HGBA1C 7.7 (H) 10/04/2017  HgbA1c goal < 7.0 Currently on: Novolog Continue CBG monitoring and SSI to maintain glucose 140-180 mg/dl DM education   TOBACCO ABUSE Current smoker Smoking cessation counseling provided Nicotine patch provided  Other Active Problems: Active Problems:   History of stroke   Intracranial vascular stenosis   Altered mental status, unspecified   Sepsis (HCC)   Acute pyelonephritis   Dizziness   Hypertension   Chronic systolic congestive heart failure (HCC)   Elevated troponin   Cerebral thrombosis with  cerebral infarction   Diabetes mellitus [facility])    Hospital day # 5 VTE prophylaxis:  Heparin  Diet : Diet heart healthy/carb modified Room service appropriate? Yes; Fluid consistency: Thin Diet NPO time specified   FAMILY UPDATES: No family at bedside  TEAM UPDATES: Carney Living, MD   Prior Home Stroke Medications:  aspirin 81 mg daily and clopidogrel 75 mg daily  Discharge Stroke Meds:  Please discharge patient on ASA 325 mg daily and Plavix 75 mg daily for now.   Disposition: 06-Home-Health Care Svc Therapy Recs:               SNF Follow Up:  Follow-up Information    Nilda Riggs, NP. Schedule an appointment as soon as possible for a visit in 6 week(s).   Specialty:  Family Medicine Contact information: [address] [place] [address] [phone]          Annie Sable, NP -PCP Follow up in 1-2 weeks      Assessment & plan discussed with with attending physician and they are in agreement.    Beryl Meager, ANP-C Stroke Neurology Team 10/09/2017 11:01 AM  10/08/2017  ATTENDING ASSESSMENT: I reviewed above note and agree with the assessment and plan. I have made any additions or clarifications directly to the above note. Pt was seen and examined.   62 year old male with history of diabetes, hypertension, smoker, marijuana abuse, schizophrenia had pontine infarct in 2011 and DWI negative stroke in 06/2014 with right INO and left gait nystagmus.  MRI in 06/2014 showed no acute stroke but old right cerebellum, and pontine infarcts.  MRI showed diffuse intracranial stenosis.  Carotid Doppler left ICA 40-79% stenosis, EF 40-50%, LDL 78 and A1c 6.4.  He was put on DAPT.   This time patient was admitted for falling at home, altered mental status, tachycardia, diarrhea, fever with T-max 101.3, cough, chills.  CT no acute abnormality.  UDS negative.  UTI showed Klebsiella infection.  Blood culture also showed positive for Klebsiella, and  he was currently treated for sepsis with Keflex.  MRI showed right  BG punctate and left CR small infarcts.  CTA head and neck left ICA 50% stenosis right VA occluded, moderate to severe stenosis bilateral ICA siphons and right P2.  LDL 79 and A1c 7.7.  EF 50-55%.  Although the location of strokes consistent with small vessel disease given significance and stroke risk factors, his sepsis with bacteremia also raises question of endocarditis.  Agree with TEE on Monday to rule out endocarditis.  Currently, patient on aspirin 325, Plavix and Lipitor 80 for maximized medical treatment for intracranial stenosis. Will follow.  10/09/2017  ATTENDING ASSESSMENT: No acute event overnight.  Afebrile, BP stable.  Still has leukocytosis.  Pending TEE on Monday.  Continue dual antiplatelet and statin. Will follow    Marvel PlanJindong Xu, MD PhD Stroke Neurology 10/09/2017 11:01 AM  To contact Stroke Continuity provider, please refer to [email or web address]. After hours, contact General Neurology

## 2017-10-09 NOTE — Progress Notes (Signed)
Family Medicine Teaching Service Daily Progress Note Intern Pager: [phone]  Patient name: [name] Williams Medical record number: [id number] Date of birth: Aug 28, 1955 Age: 62 y.o. Gender: male  Primary Care Provider: Annie Sable, NP Consultants: Neuro Code Status: Partial (DNR but DO intubate)  Pt Overview and Major Events to Date:  10/04/2017: Admitted, started on vanc/zosyn 10/05/2017: transition to CTX with +Bcx and UCx w klebsiella 10/06/2017: UCx sensitive to cephalosporins; transition to cephalexin 10/07/2017: MRI with two acute ischemic infarcts  Assessment and Plan: [name] Williams a 62 y.o.malepresenting with altered mental status and syncope. PMH is significant forHTN, T2DM, CVA in 2011, HLD, and schizophrenia.   Acute CVA: MRI brain with small foci of acute ischemia in the posterior right lentiform nucleus and left corona radiata. Most of stroke work-up has already been performed this admission. Concern for embolic phenomenon. Exam this morning with 3/5 weakness on the right and 4/5 weakness on the left. Awake and answering questions this morning. - Neuro consulted, appreciated recommendations: TEE, f/u in [facility] clinic in 6 weeks  - TEE ordered. Spoke with Cards- will hopefully happen Monday. If negative, will need Holter or loop recorder. NPO at midnight tonight.  - Continue Aspirin 81mg  daily and Plavix  - Lipitor 40mg  daily - PT/OT recommending SNF on discharge  Klebsiella Bacteremia 2/2 UTI: Afebrile overnight. WBC 10.8 > 10.1. Random bladder scan was 168 and he is having really good urine output, so don't think BPH is contributing. - Continue Keflex. Plan for 7 day course (ending 2/25)  HTN: Normotensive overnight - Continue home lisinopril 20mg  daily - Hold home amlodipine 5mg  and HCTZ 12.5 given BP at goal  T2DM:A1C7.7 on admission; on metformin 1000mg  BID at home. - hold metformin  - moderate SSI, CBGs  HLD: LDL 79, cholesterol 123, HDL 20  2/21 - Increase Lipitor from 20mg  daily to 40mg  daily  Schizophrenia:Stable.Followed by [facility]; receives paliperidone once monthly (last on 2/11).   FEN/GI: Heart healthy carb-modified diet Prophylaxis:heparin sq  Disposition: Awaiting TEE, hopefully will happen Monday. Plan for discharge to SNF after that.  Subjective:  Patient doing well this morning. States he is feeling fine. No concerns. Feels like he is a little stronger today.   Objective: Temp:  [98.2 F (36.8 C)-98.6 F (37 C)] 98.6 F (37 C) (02/24 0421) Pulse Rate:  [47-79] 58 (02/24 0421) Resp:  [16-18] 16 (02/24 0421) BP: (134-150)/(60-78) 137/69 (02/24 0421) SpO2:  [97 %-99 %] 98 % (02/24 0421) Physical Exam: General: Awake, sitting up in bed, in NAD Cardiovascular: RRR, no murmurs Respiratory: CTAB, normal WOB Extremities: warm and well-perfused Neuro: Alert, oriented x 3, CN 2-12 intact, pupils miotic, 3/5 muscle strength in the RUE and RLE, 4/5 muscle strength in the LUE and LLE, slow finger to nose slightly worse on the right.   Laboratory: Recent Labs  Lab 10/07/17 0715 10/08/17 0235 10/09/17 0519  WBC 10.8* 10.1 12.3*  HGB 11.2* 10.9* 11.4*  HCT 34.7* 34.3* 35.5*  PLT 135* 170 203   Recent Labs  Lab 10/04/17 1518  10/07/17 0715 10/08/17 0235 10/09/17 0519  NA 140   < > 137 137 138  K 3.8   < > 3.8 4.1 4.2  CL 104   < > 103 104 104  CO2 23   < > 21* 22 24  BUN 16   < > 11 10 9   CREATININE 1.34*   < > 1.09 1.11 1.03  CALCIUM 9.2   < > 8.8* 8.7* 9.0  PROT 6.3*  --   --   --   --   BILITOT 0.8  --   --   --   --   ALKPHOS 48  --   --   --   --   ALT 18  --   --   --   --   AST 29  --   --   --   --   GLUCOSE 169*   < > 127* 216* 126*   < > = values in this interval not displayed.    Imaging/Diagnostic Tests: CT head negative ECHO: 50-55%, mild LVH, no diastolic dysfunction MRI brain: small foci of acute ischemia within the posterior right lentiform nucleus and left corona  radiata.  [name] Williams, [name] Blansett G, MD 10/09/2017, 7:02 AM PGY-3, [place] [facility] FPTS Intern pager: [phone], text pages welcome

## 2017-10-10 ENCOUNTER — Encounter (HOSPITAL_COMMUNITY)
Admission: EM | Disposition: A | Discharge status: Skilled Nursing Facility | Payer: Self-pay | Source: Home / Self Care | Attending: Family Medicine

## 2017-10-10 ENCOUNTER — Inpatient Hospital Stay (HOSPITAL_COMMUNITY): Payer: Medicare (Managed Care) | Admitting: Anesthesiology

## 2017-10-10 ENCOUNTER — Inpatient Hospital Stay (HOSPITAL_COMMUNITY): Payer: Medicare (Managed Care)

## 2017-10-10 ENCOUNTER — Other Ambulatory Visit: Payer: Self-pay | Admitting: Cardiology

## 2017-10-10 ENCOUNTER — Encounter (HOSPITAL_COMMUNITY): Payer: Self-pay | Admitting: *Deleted

## 2017-10-10 DIAGNOSIS — I639 Cerebral infarction, unspecified: Secondary | ICD-10-CM

## 2017-10-10 DIAGNOSIS — R7881 Bacteremia: Secondary | ICD-10-CM

## 2017-10-10 DIAGNOSIS — I6330 Cerebral infarction due to thrombosis of unspecified cerebral artery: Secondary | ICD-10-CM

## 2017-10-10 DIAGNOSIS — I6389 Other cerebral infarction: Secondary | ICD-10-CM

## 2017-10-10 DIAGNOSIS — I69359 Hemiplegia and hemiparesis following cerebral infarction affecting unspecified side: Secondary | ICD-10-CM

## 2017-10-10 HISTORY — PX: TEE WITHOUT CARDIOVERSION: SHX5443

## 2017-10-10 LAB — GLUCOSE, CAPILLARY
GLUCOSE-CAPILLARY: 225 mg/dL — AB (ref 65–99)
Glucose-Capillary: 127 mg/dL — ABNORMAL HIGH (ref 65–99)
Glucose-Capillary: 138 mg/dL — ABNORMAL HIGH (ref 65–99)
Glucose-Capillary: 150 mg/dL — ABNORMAL HIGH (ref 65–99)
Glucose-Capillary: 164 mg/dL — ABNORMAL HIGH (ref 65–99)
Glucose-Capillary: 205 mg/dL — ABNORMAL HIGH (ref 65–99)

## 2017-10-10 LAB — BASIC METABOLIC PANEL
ANION GAP: 10 (ref 5–15)
BUN: 11 mg/dL (ref 6–20)
CALCIUM: 8.8 mg/dL — AB (ref 8.9–10.3)
CO2: 22 mmol/L (ref 22–32)
CREATININE: 0.92 mg/dL (ref 0.61–1.24)
Chloride: 103 mmol/L (ref 101–111)
GLUCOSE: 141 mg/dL — AB (ref 65–99)
Potassium: 4.1 mmol/L (ref 3.5–5.1)
Sodium: 135 mmol/L (ref 135–145)

## 2017-10-10 LAB — CBC
HEMATOCRIT: 33.7 % — AB (ref 39.0–52.0)
HEMOGLOBIN: 10.9 g/dL — AB (ref 13.0–17.0)
MCH: 27.7 pg (ref 26.0–34.0)
MCHC: 32.3 g/dL (ref 30.0–36.0)
MCV: 85.8 fL (ref 78.0–100.0)
Platelets: 213 10*3/uL (ref 150–400)
RBC: 3.93 MIL/uL — AB (ref 4.22–5.81)
RDW: 13.8 % (ref 11.5–15.5)
WBC: 12.1 10*3/uL — ABNORMAL HIGH (ref 4.0–10.5)

## 2017-10-10 LAB — TROPONIN I

## 2017-10-10 LAB — MAGNESIUM: Magnesium: 1.4 mg/dL — ABNORMAL LOW (ref 1.7–2.4)

## 2017-10-10 LAB — PHOSPHORUS: Phosphorus: 3.1 mg/dL (ref 2.5–4.6)

## 2017-10-10 SURGERY — ECHOCARDIOGRAM, TRANSESOPHAGEAL
Anesthesia: Monitor Anesthesia Care

## 2017-10-10 MED ORDER — CEPHALEXIN 500 MG PO CAPS: 500.0000 mg | ORAL_CAPSULE | Freq: Four times a day (QID) | ORAL | 0 refills | Status: AC

## 2017-10-10 MED ORDER — BUTAMBEN-TETRACAINE-BENZOCAINE 2-2-14 % EX AERO
INHALATION_SPRAY | CUTANEOUS | Status: DC | PRN
  Administered 2017-10-10: 2 via TOPICAL

## 2017-10-10 MED ORDER — MAGNESIUM SULFATE 2 GM/50ML IV SOLN
2.0000 g | Freq: Once | INTRAVENOUS | Status: DC
  Filled 2017-10-10 (×2): qty 50

## 2017-10-10 MED ORDER — AMLODIPINE BESYLATE 5 MG PO TABS: 5.0000 mg | ORAL_TABLET | Freq: Every day | ORAL | Status: DC

## 2017-10-10 MED ORDER — INSULIN GLARGINE 100 UNIT/ML ~~LOC~~ SOLN: 7.0000 [IU] | Freq: Every day | SUBCUTANEOUS | Status: DC

## 2017-10-10 MED ORDER — PROPOFOL 500 MG/50ML IV EMUL
INTRAVENOUS | Status: DC | PRN
  Administered 2017-10-10: 75 ug/kg/min via INTRAVENOUS

## 2017-10-10 MED ORDER — POLYETHYLENE GLYCOL 3350 17 G PO PACK: 17.0000 g | PACK | Freq: Every day | ORAL | 0 refills | Status: DC

## 2017-10-10 MED ORDER — SODIUM CHLORIDE 0.9 % IV SOLN
INTRAVENOUS | Status: DC
  Administered 2017-10-10 (×2): via INTRAVENOUS

## 2017-10-10 MED ORDER — ASPIRIN 325 MG PO TBEC: 325.0000 mg | DELAYED_RELEASE_TABLET | Freq: Every day | ORAL | 0 refills | Status: DC

## 2017-10-10 MED ORDER — PROPOFOL 10 MG/ML IV BOLUS
INTRAVENOUS | Status: DC | PRN
  Administered 2017-10-10: 20 mg via INTRAVENOUS
  Administered 2017-10-10: 10 mg via INTRAVENOUS

## 2017-10-10 MED ORDER — ATORVASTATIN CALCIUM 80 MG PO TABS: 80.0000 mg | ORAL_TABLET | Freq: Every day | ORAL | 0 refills | Status: DC

## 2017-10-10 MED ORDER — LISINOPRIL 20 MG PO TABS: 20.0000 mg | ORAL_TABLET | Freq: Every day | ORAL | 0 refills | Status: DC

## 2017-10-10 NOTE — Anesthesia Preprocedure Evaluation (Addendum)
Anesthesia Evaluation  Patient identified by MRN, date of birth, ID band Patient awake    Reviewed: Allergy & Precautions, NPO status , Patient's Chart, lab work & pertinent test results  Airway Mallampati: III  TM Distance: >3 FB Neck ROM: Full    Dental  (+) Dental Advisory Given, Missing, Poor Dentition   Pulmonary Current Smoker,    Pulmonary exam normal breath sounds clear to auscultation       Cardiovascular hypertension, Pt. on medications + Peripheral Vascular Disease and +CHF  negative cardio ROS Normal cardiovascular exam Rhythm:Regular Rate:Normal  '19 TTE - LV cavity size was mildly dilated. Wall   thickness was increased in a pattern of mild LVH. EF was in the   range of 50% to 55%. Left ventricular diastolic function  parameters were normal.  '15 Carotid US - 1% to 39% right ICA stenosis. 40% to 59% left ICA stenosis upper end of scale bysystolic velocities however the diastolic velocity and ICA/CCA ratio would suggest a 60% to 79% stenosis along with plaquecharaterization and spectral broadening.   Neuro/Psych Schizophrenia CVA, Residual Symptoms    GI/Hepatic negative GI ROS, (+)     substance abuse  ,   Endo/Other  negative endocrine ROSdiabetes, Type 2, Oral Hypoglycemic Agents  Renal/GU negative Renal ROS  negative genitourinary   Musculoskeletal negative musculoskeletal ROS (+)   Abdominal   Peds  Hematology  (+) anemia ,   Anesthesia Other Findings   Reproductive/Obstetrics                            Anesthesia Physical Anesthesia Plan  ASA: III  Anesthesia Plan: MAC   Post-op Pain Management:    Induction: Intravenous  PONV Risk Score and Plan: Propofol infusion and Treatment may vary due to age or medical condition  Airway Management Planned: Nasal Cannula  Additional Equipment: None  Intra-op Plan:   Post-operative Plan:   Informed Consent: I  have reviewed the patients History and Physical, chart, labs and discussed the procedure including the risks, benefits and alternatives for the proposed anesthesia with the patient or authorized representative who has indicated his/her understanding and acceptance.   Dental advisory given  Plan Discussed with: CRNA  Anesthesia Plan Comments:         Anesthesia Quick Evaluation

## 2017-10-10 NOTE — Progress Notes (Signed)
OT Cancellation    10/10/17 1100  OT Visit Information  Last OT Received On 10/10/17  Reason Eval/Treat Not Completed Patient at procedure or test/ unavailable (Off the floor for endo. Will return as schedule allows. Thank you.)   Curlene Dolphinharis Camaryn Lumbert MSOT, OTR/L Acute Rehab Pager: [phone] Office: [phone]

## 2017-10-10 NOTE — Progress Notes (Signed)
Patient discharged to [facility]. Report called in to SNF; patient and family updated.

## 2017-10-10 NOTE — Clinical Social Work Placement (Signed)
Nurse to call report to [phone]    CLINICAL SOCIAL WORK PLACEMENT  NOTE  Date:  10/10/2017  Patient Details  Name: [name] Williams MRN: [id number] Date of Birth: 07-25-1956  Clinical Social Work is seeking post-discharge placement for this patient at the Skilled  Nursing Facility level of care (*CSW will initial, date and re-position this form in  chart as items are completed):  Yes   Patient/family provided with [facility] Social Work Department's list of facilities offering this level of care within the geographic area requested by the patient (or if unable, by the patient's family).  Yes   Patient/family informed of their freedom to choose among providers that offer the needed level of care, that participate in Medicare, Medicaid or managed care program needed by the patient, have an available bed and are willing to accept the patient.  Yes   Patient/family informed of [facility]'s ownership interest [facility] and [facility], as well as of the fact that they are under no obligation to receive care at these facilities.  PASRR submitted to EDS on 10/07/17     PASRR number received on 10/07/17     Existing PASRR number confirmed on       FL2 transmitted to all facilities in geographic area requested by pt/family on 10/07/17     FL2 transmitted to all facilities within larger geographic area on       Patient informed that his/her managed care company has contracts with or will negotiate with certain facilities, including the following:        Yes   Patient/family informed of bed offers received.  [facility]     Physician recommends and patient chooses bed at      [facility]  Patient to be transferred to facility by [facility]     Patient family notified on 10/10/17 of transfer.  Name of family member notified:  [name]     PHYSICIAN       Additional Comment:     _______________________________________________ Baldemar LenisElizabeth M Glendora Clouatre, LCSW 10/10/2017, 3:56 PM

## 2017-10-10 NOTE — Progress Notes (Signed)
    [facility] has been requested to perform a transesophageal echocardiogram on  [name] for Stroke and bacteremia.  After careful review of history and examination, the risks and benefits of transesophageal echocardiogram have been explained including risks of esophageal damage, perforation (1:10,000 risk), bleeding, pharyngeal hematoma as well as other potential complications associated with conscious sedation including aspiration, arrhythmia, respiratory failure and death. Alternatives to treatment were discussed, questions were answered. Patient is willing to proceed.  TEE - Dr. Jens Somrenshaw @ [facility]. Keep NPO. Meds with sips.   Manson PasseyBhavinkumar Rossetta Kama, PA-C 10/10/2017 8:58 AM

## 2017-10-10 NOTE — Discharge Instructions (Signed)
It was a pleasure taking care of you during this hospitalization!  We have made some medication changes: -We have prescribed Keflex (an antibiotic) for your UTI. Please take this 4 times a day for the next few days (your last day should be 2/28). -We have increased your Aspirin dose from 81mg  daily to 325mg  daily -We have increased your Lipitor from 40mg  daily to 80mg  daily -We have increased your Lisinopril from 10mg  daily to 20mg  daily.  The brain doctors recommended a 30 day heart monitor to make sure your stroke was not caused by an abnormal heart rhythm called atrial fibrillation. They will be calling you in the next few days to get that heart monitor set up.

## 2017-10-10 NOTE — Anesthesia Postprocedure Evaluation (Signed)
Anesthesia Post Note  Patient: [name] Williams  Procedure(s) Performed: TRANSESOPHAGEAL ECHOCARDIOGRAM (TEE) (N/A )     Patient location during evaluation: PACU Anesthesia Type: MAC Level of consciousness: awake and alert Pain management: pain level controlled Vital Signs Assessment: post-procedure vital signs reviewed and stable Respiratory status: spontaneous breathing, nonlabored ventilation and respiratory function stable Cardiovascular status: stable and blood pressure returned to baseline Anesthetic complications: no    Last Vitals:  Vitals:   10/10/17 1222 10/10/17 1230  BP: (!) 103/59 140/76  Pulse: (!) 47 63  Resp: 12 16  Temp: 36.8 C   SpO2: 98% 100%    Last Pain:  Vitals:   10/10/17 1222  TempSrc: Oral  PainSc:                  Audry Pili

## 2017-10-10 NOTE — Progress Notes (Signed)
PT Cancellation Note  Patient Details Name: Cleotilde NeerJames Thammavong MRN: [id number] DOB: 04/16/1956   Cancelled Treatment:    Reason Eval/Treat Not Completed: Patient at procedure or test/unavailable. Acute PT to return as able.  Lewis ShockAshly Latonda Larrivee, PT, DPT Pager #: [phone] Office #: [phone]    Rozell Searingshly M Saina Waage 10/10/2017, 11:12 AM

## 2017-10-10 NOTE — Transfer of Care (Signed)
Immediate Anesthesia Transfer of Care Note  Patient: [name] NeerJames Chargois  Procedure(s) Performed: TRANSESOPHAGEAL ECHOCARDIOGRAM (TEE) (N/A )  Patient Location: PACU  Anesthesia Type:General  Level of Consciousness: awake and alert   Airway & Oxygen Therapy: Patient Spontanous Breathing and Patient connected to nasal cannula oxygen  Post-op Assessment: Report given to RN and Post -op Vital signs reviewed and stable  Post vital signs: Reviewed and stable  Last Vitals:  Vitals:   10/10/17 1214 10/10/17 1215  BP: 122/70   Pulse: (!) 47 (!) 44  Resp: 17 15  Temp:    SpO2: 100% 100%    Last Pain:  Vitals:   10/10/17 1105  TempSrc: Oral  PainSc:          Complications: No apparent anesthesia complications

## 2017-10-10 NOTE — Interval H&P Note (Signed)
History and Physical Interval Note:  10/10/2017 11:32 AM  [name] Williams  has presented today for surgery, with the diagnosis of stroke, r/o endocarditis  The various methods of treatment have been discussed with the patient and family. After consideration of risks, benefits and other options for treatment, the patient has consented to  Procedure(s): TRANSESOPHAGEAL ECHOCARDIOGRAM (TEE) (N/A) as a surgical intervention .  The patient's history has been reviewed, patient examined, no change in status, stable for surgery.  I have reviewed the patient's chart and labs.  Questions were answered to the patient's satisfaction.     Olga MillersBrian Crenshaw

## 2017-10-10 NOTE — Progress Notes (Signed)
  Echocardiogram Echocardiogram Transesophageal has been performed.  Louis Ivery T Azalee Weimer 10/10/2017, 12:35 PM

## 2017-10-10 NOTE — CV Procedure (Signed)
    Transesophageal Echocardiogram Note  Cleotilde NeerJames Stoecker [id number] Feb 06, 1956  Procedure: Transesophageal Echocardiogram Indications: CVA   Procedure Details Consent: Obtained Time Out: Verified patient identification, verified procedure, site/side was marked, verified correct patient position, special equipment/implants available, Radiology Safety Procedures followed,  medications/allergies/relevent history reviewed, required imaging and test results available.  Performed  Medications:  Pt sedated by anesthesia with diprovan 91 mg IV  Low normal LV function; mild MR; atrial septal aneurysm; negative saline microcavitation study.   Complications: No apparent complications Patient did tolerate procedure well.  Olga MillersBrian Dinah Lupa, MD

## 2017-10-10 NOTE — Interval H&P Note (Signed)
History and Physical Interval Note:  10/10/2017 12:17 PM  [name] Williams  has presented today for surgery, with the diagnosis of stroke, r/o endocarditis  The various methods of treatment have been discussed with the patient and family. After consideration of risks, benefits and other options for treatment, the patient has consented to  Procedure(s): TRANSESOPHAGEAL ECHOCARDIOGRAM (TEE) (N/A) as a surgical intervention .  The patient's history has been reviewed, patient examined, no change in status, stable for surgery.  I have reviewed the patient's chart and labs.  Questions were answered to the patient's satisfaction.     Olga MillersBrian Crenshaw

## 2017-10-10 NOTE — Anesthesia Procedure Notes (Signed)
Date/Time: 10/10/2017 12:01 PM Performed by: Gwenyth AllegraAdami, Holly Pring, CRNA Pre-anesthesia Checklist: Patient identified, Emergency Drugs available, Suction available, Patient being monitored and Timeout performed Patient Re-evaluated:Patient Re-evaluated prior to induction Oxygen Delivery Method: Nasal cannula Preoxygenation: Pre-oxygenation with 100% oxygen Induction Type: IV induction

## 2017-10-10 NOTE — Progress Notes (Addendum)
Family Medicine Teaching Service Daily Progress Note Intern Pager: [phone]  Patient name: [name] Williams Medical record number: [id number] Date of birth: 05-21-1956 Age: 62 y.o. Gender: male  Primary Care Provider: Annie Sable, NP Consultants: Neuro Code Status: Partial (DNR but DO intubate)  Pt Overview and Major Events to Date:  10/04/2017: Admitted, started on vanc/zosyn 10/05/2017: transition to CTX with +Bcx and UCx w klebsiella 10/06/2017: UCx sensitive to cephalosporins; transition to cephalexin 10/07/2017: MRI with two acute ischemic infarcts  Assessment and Plan: Euell Schiff a 62 y.o.malepresenting with altered mental status and syncope. PMH is significant forHTN, T2DM, CVA in 2011, HLD, and schizophrenia.   Atypical chest pain Patient complained of new chest pain in sternum this AM while laying in bed. It has now resolved.It did not radiate. Denies any SOB. Chest is not tender to palpation. HDS and not tachycardic. It does not appear to be ACS, but given his significant vascular  - ordered stat EKG and troponin   Acute CVA: MRI brain with small foci of acute ischemia concern for embolic phenomenon. Planned TEE on 2/25.  If negative, will need Holter or loop recorder.  - Neuro recommend appreciated  - Continue Aspirin 81mg  daily and Plavix  - Lipitor 80mg  daily - PT/OT recommending SNF on discharge  Hypomagnesemia and Phosphatemia Mg 1.3, Phos 2.4 on admit. Will recheck.  - replete as necessary  Klebsiella Bacteremia 2/2 UTI:  - Continue Keflex. Plan for 10 day course (ending 2/28)  Will need outpatient course fo 10 days total.   HTN: Mildly hypertensive in 140s/60s.  - Continue home lisinopril 20mg  daily - restart home amlodipine 5mg  if pt is still hypertensive in PM after TEE  - hold HCTZ 12.5 mg  T2DM:A1C7.7 on admission; on metformin 1000mg  BID at home. CBGs past 24s 160-250. Used 13 units of insulin.  - consider starting basal insulin in pm @ 7  units of lantus - hold metformin  - moderate SSI, CBGs  HLD: LDL 79, cholesterol 123, HDL 20 2/21 - atorvastatin 80mg  daily  Schizophrenia:Stable.Followed by [facility]; receives paliperidone once monthly (last on 2/11).   FEN/GI: NPO Prophylaxis:heparin sq  Disposition: Awaiting TEE, hopefully will happen Monday. Plan for discharge to SNF after that.  Subjective:   Pt endorsing new chest pain. 4/10 pain. In middle of chest. It is now  Resolved. Occurred while lying in bed. He denies any current chest pain or SOB.   Objective: Temp:  [97.8 F (36.6 C)-99.5 F (37.5 C)] 99.5 F (37.5 C) (02/25 0315) Pulse Rate:  [56-68] 56 (02/25 0315) Resp:  [17] 17 (02/25 0021) BP: (135-146)/(65-74) 141/74 (02/25 0315) SpO2:  [94 %-100 %] 98 % (02/25 0315) Physical Exam: General: Awake, sitting up in bed, in NAD Cardiovascular: RRR, no murmurs, chest wall nontender to palpation Respiratory: CTAB, normal WOB Extremities: warm and well-perfused no edema Neuro: Alert, oriented x 3, CN 2-12 intact, pupils miotic, 3/5 muscle strength in the RUE and RLE, 4/5 muscle strength in the LUE and LLE,   Laboratory: Recent Labs  Lab 10/08/17 0235 10/09/17 0519 10/10/17 0338  WBC 10.1 12.3* 12.1*  HGB 10.9* 11.4* 10.9*  HCT 34.3* 35.5* 33.7*  PLT 170 203 213   Recent Labs  Lab 10/04/17 1518  10/07/17 0715 10/08/17 0235 10/09/17 0519  NA 140   < > 137 137 138  K 3.8   < > 3.8 4.1 4.2  CL 104   < > 103 104 104  CO2 23   < >  21* 22 24  BUN 16   < > 11 10 9   CREATININE 1.34*   < > 1.09 1.11 1.03  CALCIUM 9.2   < > 8.8* 8.7* 9.0  PROT 6.3*  --   --   --   --   BILITOT 0.8  --   --   --   --   ALKPHOS 48  --   --   --   --   ALT 18  --   --   --   --   AST 29  --   --   --   --   GLUCOSE 169*   < > 127* 216* 126*   < > = values in this interval not displayed.    Imaging/Diagnostic Tests: CT head negative ECHO: 50-55%, mild LVH, no diastolic dysfunction MRI brain: small foci of acute  ischemia within the posterior right lentiform nucleus and left corona radiata.  Garnette Gunnerhompson, Aaron B, MD 10/10/2017, 6:39 AM PGY-1, [facility] FPTS Intern pager: [phone], text pages welcome

## 2017-10-10 NOTE — Progress Notes (Signed)
Patient discharged to [facility] and was picked up by [facility] representative.

## 2017-10-10 NOTE — Progress Notes (Addendum)
NEUROHOSPITALISTS STROKE TEAM - DAILY PROGRESS NOTE   SUBJECTIVE (INTERVAL HISTORY) No family again this AM at the bedside. Patient is found laying in bed in NAD. Overall he feels his condition is slowly improving. Recently returned from TEE. His overall strength has improved. Able to lift all extremities off the bed. Voices no new complaints. No new events reported overnight.     OBJECTIVE Lab Results: CBC:  Recent Labs  Lab 10/08/17 0235 10/09/17 0519 10/10/17 0338  WBC 10.1 12.3* 12.1*  HGB 10.9* 11.4* 10.9*  HCT 34.3* 35.5* 33.7*  MCV 86.0 86.8 85.8  PLT 170 203 213   BMP: Recent Labs  Lab 10/04/17 1728  10/06/17 0409 10/07/17 0715 10/08/17 0235 10/09/17 0519 10/10/17 1003  NA  --    < > 137 137 137 138 135  K  --    < > 3.9 3.8 4.1 4.2 4.1  CL  --    < > 104 103 104 104 103  CO2  --    < > 24 21* 22 24 22   GLUCOSE  --    < > 98 127* 216* 126* 141*  BUN  --    < > 15 11 10 9 11   CREATININE 1.38*   < > 1.16 1.09 1.11 1.03 0.92  CALCIUM  --    < > 8.4* 8.8* 8.7* 9.0 8.8*  MG 1.3*  --   --   --   --   --  1.4*  PHOS 2.4*  --   --   --   --   --  3.1   < > = values in this interval not displayed.   Liver Function Tests:  Recent Labs  Lab 10/04/17 1518  AST 29  ALT 18  ALKPHOS 48  BILITOT 0.8  PROT 6.3*  ALBUMIN 3.3*   Cardiac Enzymes:  Recent Labs  Lab 10/04/17 1728 10/04/17 2255 10/05/17 0416 10/05/17 0917 10/10/17 1003  CKTOTAL 719*  --   --   --   --   TROPONINI <0.03 <0.03 0.54* <0.03 <0.03   Microbiology:  Urinalysis:  Recent Labs  Lab 10/04/17 1328  COLORURINE YELLOW  APPEARANCEUR CLEAR  LABSPEC 1.011  PHURINE 5.0  GLUCOSEU NEGATIVE  HGBUR NEGATIVE  BILIRUBINUR NEGATIVE  KETONESUR NEGATIVE  PROTEINUR NEGATIVE  NITRITE NEGATIVE  LEUKOCYTESUR SMALL*   Urine Drug Screen:     Component Value Date/Time   LABOPIA NONE DETECTED 10/04/2017 0703   COCAINSCRNUR NONE DETECTED  10/04/2017 0703   LABBENZ NONE DETECTED 10/04/2017 0703   AMPHETMU NONE DETECTED 10/04/2017 0703   THCU NONE DETECTED 10/04/2017 0703   LABBARB NONE DETECTED 10/04/2017 0703    Alcohol Level:  Recent Labs  Lab 10/04/17 1728  ETH <10   PHYSICAL EXAM Temp:  [97.8 F (36.6 C)-99.5 F (37.5 C)] 98.3 F (36.8 C) (02/25 1222) Pulse Rate:  [44-68] 63 (02/25 1230) Resp:  [0-20] 16 (02/25 1230) BP: (103-157)/(59-80) 140/76 (02/25 1230) SpO2:  [94 %-100 %] 100 % (02/25 1230) Weight:  [68 kg (150 lb)] 68 kg (150 lb) (02/25 1105) General - Well nourished, well developed, in no apparent distress HEENT-  Normocephalic,    Cardiovascular - Regular rate and rhythm  Respiratory - Lungs clear bilaterally. No wheezing. Abdomen - soft and non-tender, BS normal Extremities- no edema or cyanosis Patient is awake alert oriented x3 His speech is mildly dysarthric-unclear if this is his baseline neurologist because he is edentulous. Cranial nerves: Pupils equal round reactive light,  extraocular muscles intact, visual fields full, face symmetric, facial sensation intact bilaterally, auditory acuity grossly intact, palate elevates midline, tongue midline, shoulder shrug intact bilaterally. Motor exam: Upper extremities bilaterally 4+/5 Lower extremities bilaterally 4+/5-if anything the right leg is stronger than the left leg mildly. Sensory exam: Intact to light touch with no extinction Coordination: Intact finger nose finger bilaterally Gait was not tested  IMAGING: I have personally reviewed the radiological images below and agree with the radiology interpretations. Ct Angio Head W Or Wo Contrast Ct Angio Neck W Or Wo Contrast Result Date: 10/08/2017 IMPRESSION: CT HEAD: 1. Known acute small infarcts better seen on prior MRI. 2. Old bilateral basal ganglia and thalami infarcts. Old RIGHT cerebellar infarct. 3. Moderate chronic small vessel ischemic disease. 4. Mild parenchymal brain volume loss.  Advanced brainstem and cerebellar atrophy. CTA NECK: 1. 50% stenosis LEFT internal carotid artery. 2. Chronically occluded RIGHT vertebral artery with minimal reconstitution distal V2 segment. CTA HEAD: 1. No emergent large vessel occlusion. 2. Moderate to severe stenosis bilateral ICAs. 3. Thready, severely stenotic RIGHT vertebral artery. Moderate stenosis proximal basilar artery. 4. Moderate cerebral artery atherosclerosis. Severe stenosis RIGHT P2 segment. Electronically Signed   By: Awilda Metro M.D.   On: 10/08/2017 01:18   [name] Contrast Result Date: 10/07/2017 IMPRESSION: 1. Small foci of acute ischemia within the posterior right lentiform nucleus (2-3 mm) and left corona radiata (7 mm). No hemorrhage or mass effect. 2. Chronic ischemic microangiopathy and old right cerebellar infarct. Electronically Signed   By: Deatra Robinson M.D.   On: 10/07/2017 14:03   Echocardiogram:        10/06/2017                                       Study Conclusions - Left ventricle: The cavity size was mildly dilated. Wall   thickness was increased in a pattern of mild LVH. Systolic   function was normal. The estimated ejection fraction was in the   range of 50% to 55%. Left ventricular diastolic function   parameters were normal. - Atrial septum: No defect or patent foramen ovale was identified.  TEE: Study Conclusions - Left ventricle: Systolic function was normal. The estimated   ejection fraction was in the range of 50% to 55%. Wall motion was   normal; there were no regional wall motion abnormalities. - Aortic valve: No evidence of vegetation. - Mitral valve: No evidence of vegetation. - Left atrium: No evidence of thrombus in the atrial cavity or   appendage. - Atrial septum: There was an atrial septal aneurysm. - Tricuspid valve: No evidence of vegetation. - Pulmonic valve: No evidence of vegetation. Impressions: - Normal LV function; atrial septal aneurysm; negative saline    microcavitation study.                                            IMPRESSION: [name] is a 62 y.o. male with PMH of HTN, HLD, DM who present to hospital with UTI sepsis and AMS. MRI reveals:  Small foci of acute ischemia within the posterior right lentiform nucleus (2-3 mm) and left corona radiata (7 mm)  Suspected Etiology: likely small vessel,  Ruled out embolic source, TEE negative  Resultant Symptoms: Dysarthria and B/L leg weakness Stroke  Risk Factors: diabetes mellitus, hyperlipidemia, hypertension and smoking Other Stroke Risk Factors: Advanced age, Cigarette smoker, Hx stroke, CKD  Outstanding Stroke Work-up Studies:    Work up completed  PLAN  10/10/2017: Continue Aspirin/ Plavix/Statin Frequent neuro checks Telemetry monitoring PT/OT/SLP Consult Case Management /MSW Ongoing aggressive stroke risk factor management Patient's family counseled to be compliant with his antithrombotic medications Patient's family counseled on Lifestyle modifications including, Diet, Exercise, and Stress Follow up with [facility] in 6 weeks  HX OF STROKES: Prior MRI from 2011 showed a pontine stroke. CTA - Old bilateral basal ganglia and thalami infarcts. Old RIGHT cerebellar infarct  INTRACRANIAL Atherosclerosis &Stenosis: On DAPT at admission  DYSPHAGIA: Passed swallow evaluation Aspiration Precautions in progress  Ruled out endocarditis: TEE  Negative for acute findings  MEDICAL ISSUES: UTI Sepsis  HYPERTENSION: Stable, Avoid Hypotension Permissive hypertension (OK if <220/120) for 24-48 hours post stroke and then gradually normalized within 5-7 days. Long term BP goal normotensive. May slowly restart home B/P medications after 48 hours Home Meds: x 2  HYPERLIPIDEMIA:    Component Value Date/Time   CHOL 123 10/07/2017 0715   TRIG 119 10/07/2017 0715   HDL 20 (L) 10/07/2017 0715   CHOLHDL 6.2 10/07/2017 0715   VLDL 24 10/07/2017 0715   LDLCALC  79 10/07/2017 0715  Home Meds:  Lipitor 40 mg LDL  goal < 70 Continued on Lipitor to 80 mg daily Continue statin at discharge  DIABETES: Lab Results  Component Value Date   HGBA1C 7.7 (H) 10/04/2017  HgbA1c goal < 7.0 Currently on: Novolog Continue CBG monitoring and SSI to maintain glucose 140-180 mg/dl DM education   TOBACCO ABUSE Current smoker Smoking cessation counseling provided Nicotine patch provided  Other Active Problems: Active Problems:   History of stroke   Intracranial vascular stenosis   Altered mental status, unspecified   Sepsis (HCC)   Acute pyelonephritis   Dizziness   Hypertension   Chronic systolic congestive heart failure (HCC)   Elevated troponin   Cerebral thrombosis with cerebral infarction   Diabetes mellitus (HCC)   Bacteremia    Hospital day # 6 VTE prophylaxis:  Heparin  Diet : Diet heart healthy/carb modified Room service appropriate? Yes; Fluid consistency: Thin   FAMILY UPDATES: No family at bedside  TEAM UPDATES: Carney Living, MD   Prior Home Stroke Medications:  aspirin 81 mg daily and clopidogrel 75 mg daily  Discharge Stroke Meds:  Please discharge patient on ASA 325 mg daily and Plavix 75 mg daily   Disposition: 06-Home-Health Care Svc Therapy Recs:               SNF Follow Up:   Contact information for follow-up providers    Nilda Riggs, NP. Schedule an appointment as soon as possible for a visit in 6 week(s).   Specialty:  Family Medicine Contact information: [address] [place] [address] [phone]            Contact information for after-discharge care    Destination    [facility] .   Service:  Skilled Nursing Contact information: [address] [address] [phone]                 Annie Sable, NP -PCP Follow up in 1-2 weeks      Assessment & plan discussed with with attending physician and they are in  agreement.    Beryl Meager, ANP-C Stroke  Neurology Team 10/10/2017 1:54 PM  10/08/2017  ATTENDING ASSESSMENT: I reviewed above note and agree with the assessment and plan. I have made any additions or clarifications directly to the above note. Pt was seen and examined.   62 year old male with history of diabetes, hypertension, smoker, marijuana abuse, schizophrenia had pontine infarct in 2011 and DWI negative stroke in 06/2014 with right INO and left gait nystagmus.  MRI in 06/2014 showed no acute stroke but old right cerebellum, and pontine infarcts.  MRI showed diffuse intracranial stenosis.  Carotid Doppler left ICA 40-79% stenosis, EF 40-50%, LDL 78 and A1c 6.4.  He was put on DAPT.   This time patient was admitted for falling at home, altered mental status, tachycardia, diarrhea, fever with T-max 101.3, cough, chills.  CT no acute abnormality.  UDS negative.  UTI showed Klebsiella infection.  Blood culture also showed positive for Klebsiella, and he was currently treated for sepsis with Keflex.  MRI showed right BG punctate and left CR small infarcts.  CTA head and neck left ICA 50% stenosis right VA occluded, moderate to severe stenosis bilateral ICA siphons and right P2.  LDL 79 and A1c 7.7.  EF 50-55%.  Although the location of strokes consistent with small vessel disease given significant stroke risk factors, his sepsis with bacteremia also raises question of endocarditis.  Agree with TEE on Monday to rule out endocarditis.  Currently, patient on aspirin 325, Plavix and Lipitor 80 for maximized medical treatment for intracranial stenosis. Will follow.  10/09/2017  ATTENDING ASSESSMENT: No acute event overnight.  Afebrile, BP stable.  Still has leukocytosis.  Pending TEE on Monday.  Continue dual antiplatelet and statin. Will follow   10/10/2017  ATTENDING ASSESSMENT: Pt has no acute event overnight. TEE showed no endocarditis. No concerning for septic emboli at this time. His stroke  location still consistent with small vessel disease given stroke risk factors of DM, HTN, HLD and smoking. However, duet to bilateral location, will recommend 30 day cardiac event monitoring as out pt to rule out afib. Continue DAPT for 3 months and then plavix alone. Continue statin on discharge for stroke prevention  Neurology will sign off. Please call with questions. Pt will follow up with stroke clinic NP at [facility] in about 4 weeks. Thanks for the consult.  Marvel Plan, MD PhD Stroke Neurology 10/10/2017 1:54 PM  To contact Stroke Continuity provider, please refer to [email or web address]. After hours, contact General Neurology

## 2017-10-12 ENCOUNTER — Other Ambulatory Visit: Payer: Self-pay | Admitting: Cardiology

## 2017-10-12 DIAGNOSIS — I4891 Unspecified atrial fibrillation: Secondary | ICD-10-CM

## 2017-10-12 DIAGNOSIS — R000 Tachycardia, unspecified: Secondary | ICD-10-CM

## 2017-10-12 DIAGNOSIS — R42 Dizziness and giddiness: Secondary | ICD-10-CM

## 2017-10-12 DIAGNOSIS — I639 Cerebral infarction, unspecified: Secondary | ICD-10-CM

## 2017-10-13 ENCOUNTER — Encounter (HOSPITAL_COMMUNITY): Payer: Self-pay | Admitting: Cardiology

## 2017-10-24 ENCOUNTER — Ambulatory Visit (INDEPENDENT_AMBULATORY_CARE_PROVIDER_SITE_OTHER): Payer: Medicare (Managed Care)

## 2017-10-24 DIAGNOSIS — R42 Dizziness and giddiness: Secondary | ICD-10-CM | POA: Diagnosis not present

## 2017-10-24 DIAGNOSIS — R000 Tachycardia, unspecified: Secondary | ICD-10-CM | POA: Diagnosis not present

## 2017-10-24 DIAGNOSIS — I4891 Unspecified atrial fibrillation: Secondary | ICD-10-CM

## 2017-10-24 DIAGNOSIS — I639 Cerebral infarction, unspecified: Secondary | ICD-10-CM

## 2017-11-04 ENCOUNTER — Telehealth: Payer: Self-pay | Admitting: Cardiovascular Disease

## 2017-11-04 NOTE — Telephone Encounter (Signed)
Wrong phone numbers.Marland Kitchen..Marland Kitchen

## 2017-11-04 NOTE — Telephone Encounter (Signed)
New Message:    [facility] is calling wanting to speak to a nurse for this pt

## 2017-11-07 ENCOUNTER — Telehealth: Payer: Self-pay | Admitting: Adult Health

## 2017-11-07 NOTE — Telephone Encounter (Signed)
Attempted to reach Dr. Dorothe PeaKoehler. No answer - message left. Will call again tomorrow morning.

## 2017-11-07 NOTE — Telephone Encounter (Signed)
Message sent to Jessica NP. 

## 2017-11-07 NOTE — Telephone Encounter (Signed)
[facility] called re: pt's upcoming appointment.  One given the name of who pt will see she then asked that a message be forwarded asking that Dr Karel JarvisKoehler(@ [facility] ) be called, Dr Malon KindleKoehler's # is [phone].  Celine MansSonja stated she is faxing over office notes and recent labs for pt. Celine MansSonja is asking that NP Shanda BumpsJessica calls to speak with Dr Dorothe PeaKoehler

## 2017-11-08 ENCOUNTER — Encounter: Payer: Self-pay | Admitting: Adult Health

## 2017-11-08 ENCOUNTER — Ambulatory Visit (INDEPENDENT_AMBULATORY_CARE_PROVIDER_SITE_OTHER): Payer: Medicare (Managed Care) | Admitting: Adult Health

## 2017-11-08 VITALS — BP 126/81 | HR 101

## 2017-11-08 DIAGNOSIS — R531 Weakness: Secondary | ICD-10-CM | POA: Diagnosis not present

## 2017-11-08 DIAGNOSIS — R471 Dysarthria and anarthria: Secondary | ICD-10-CM

## 2017-11-08 DIAGNOSIS — I6330 Cerebral infarction due to thrombosis of unspecified cerebral artery: Secondary | ICD-10-CM | POA: Diagnosis not present

## 2017-11-08 NOTE — Patient Instructions (Signed)
Continue aspirin 325 mg daily  and lipitor  for secondary stroke prevention  EMG/ nerve conduction study ordered  Ultrasound of lower legs to look for DVT  Blood work to look for other causes of weakness   Maintain strict control of hypertension with blood pressure goal below 130/90, diabetes with hemoglobin A1c goal below 6.5% and cholesterol with LDL cholesterol (bad cholesterol) goal below 70 mg/dL. I also advised the patient to eat a healthy diet with plenty of whole grains, cereals, fruits and vegetables, exercise regularly and maintain ideal body weight.  Followup in the future with me in 3 months

## 2017-11-08 NOTE — Progress Notes (Signed)
[facility] [address] [place]. [place] [id number] [phone]       OFFICE FOLLOW UP NOTE  Mr. [name] NeerJames Williams Date of Birth:  1956/07/20 Medical Record Number:  [id number]   Reason for Referral:  hospital stroke follow up  CHIEF COMPLAINT:  Chief Complaint  Patient presents with  . Follow-up    Stroke follow up, Pt sister [name] Williams is with patient. PT is at [facility] facility,and [facility] weekly. PT has had multiple  Pt  has a monitor to chest     HPI: [name] Williams is being seen today for initial visit in the office for acute infarct within the posterior right lentiform nucleus and left corona radiata on 10/08/17. History obtained from patient and chart review. Reviewed all radiology images and labs personally.  [name] PalingJames Reavesis a 61 y.o.malebrought in on 10/04/2017 with unwitnessed fall and altered mental status with a history of diarrhea, fevers, cough, and chills preceding the admission. On admission he was tachycardic but normotensive, afebrile with some tachypnea. He was oriented x2 but difficult to arouse, which was different than his baseline. Laboratory findings revealed a white count of 12.5, creatinine 1.34 and lactate 2.46 that resolved after IV fluid resuscitation.  Preliminary brain imaging by noncontrast CT of the head unremarkable. Urinary tox screen was negative. He was found to have a UTI and sepsis secondary to Klebsiella. He was recovering well with antibiotic treatment and fluid resuscitation for sepsis An MRI of the brain was ordered as a part of altered mental status workup which was completed today and showed 2 small foci of ischemia within the right lentiform nucleus and left corona radiata along with evidence of chronic microangiopathy and evidence of old cerebellar strokes and bilateral lacunar infarcts. Neurological consultation was placed for further recommendations. Patient does not complain of any lateralized weakness, tingling or numbness. He  denies any headaches or visual symptoms. He denies any chest pain or palpitations.   He reports of generalized weakness with worsening of right and left-sided weakness over the past 2-3 years.  Upon review of his chart, he had a pontine stroke in 2011 along with bilateral BG infarct, thalamic infarct, and right cerebellar infarct which has occurred throughout the years. He was a current smoker. Denies illicit drug use. During his hospitalization he had a workup for stroke including a 2D echocardiogram which revealed ejection fraction of 50-55%, mild left ventricular dilatation, no PFO or atrial septal defect. CTA head /neck reviewed and showed chronically occluded right vertebral artery and moderate to severe stenosis bilateral ICA's. LDL 77 and A1c 7.7. This stroke was likely caused by large vessel but embolic source needs to be ruled out due to recent bacteremia. TEE was negative for cardiac thrombus and it was recommended that patient follow up with 30 day cardiac monitor. Recommended patient increase lipitor 40mg  to 80mg  and continue DAPT with aspirin and plavix for 3 months and then continue plavix alone.  Patient d/c'd to SNF.   Prior to patient appointment, Dr. Dorothe Williams requested a call back regarding recent concerns about patients current condition.  Dr. Dorothe Williams has been seeing this patient for many years.  One of his main concerns is increased fatigue and being almost unarousable at SNF. Was recently started on Methylphenidate to help with fatigue but that did not make any difference so medication was stopped.  He also has concerns that his lower extremity weakness has continued to worsen where at this point he is completely immobile and unable to straighten his  legs or hips out of the 90 degree position.  Patient has been bedridden and incontinent of both bowel and bladder.  His most recent sed rate at 53 and CBC and CMP satisfactory.  An additional concern is that recently when he was working with  physical therapy he had these "spells" where he would stiffen up, his back with arch and he would appear as he was going to pass out.  His heart rate would fluctuate from bradycardia tachycardia.  30-day monitor was placed on 10/24/2017 to rule out arrhythmia.  No additional imaging has been done besides brain MRI and CT scan of cervical spine.   Patient is accompanied at today's visit by his sister.  Patient is sitting in wheelchair and slumped towards left side.  Per sister, since his most previous stroke he has had increased fatigue, dysarthria and increased confusion.  He was unable to state what month this was or where he was located.  Per sister, his dysarthria has increasingly gotten worse just since last week.  He is continuing both aspirin and Plavix without increase in bleeding or bruising.  Continues atorvastatin with unknown statin myalgia as it has not been determined if muscle pain is related to statin.  Patient continues to live in SNF facility where he has not been receiving PT as they are waiting for cardiac clearance.  Continues to wear cardiac monitor.  He does get lifted from the bed to the chair daily.  He is wearing a brief as he is incontinent of both stool and urine.  He does have generalized weakness throughout his body that has developed gradually since his most recent stroke admission.  During appointment, patient continuously kept drifting off to sleep.   ROS:   14 system review of systems performed and negative with exception of daytime sleepiness, appetite change and runny nose  PMH:  Past Medical History:  Diagnosis Date  . Diabetes mellitus   . History of stroke   . Hypertension   . Stroke [facility])     PSH:  Past Surgical History:  Procedure Laterality Date  . TEE WITHOUT CARDIOVERSION N/A 10/10/2017   Procedure: TRANSESOPHAGEAL ECHOCARDIOGRAM (TEE);  Surgeon: [name] Bunting, MD;  Location: [facility]COPY;  Service: Cardiovascular;  Laterality: N/A;    Social  History:  Social History   Socioeconomic History  . Marital status: Legally Separated    Spouse name: Not on file  . Number of children: 0  . Years of education: 12th  . Highest education level: Not on file  Occupational History  . Not on file  Social Needs  . Financial resource strain: Not on file  . Food insecurity:    Worry: Not on file    Inability: Not on file  . Transportation needs:    Medical: Not on file    Non-medical: Not on file  Tobacco Use  . Smoking status: Current Every Day Smoker    Packs/day: 1.00    Years: 10.00    Pack years: 10.00    Types: Cigarettes  . Smokeless tobacco: Never Used  Substance and Sexual Activity  . Alcohol use: No  . Drug use: Not Currently    Comment: marijuana occassionally   . Sexual activity: Never  Lifestyle  . Physical activity:    Days per week: Patient refused    Minutes per session: Patient refused  . Stress: Not on file  Relationships  . Social connections:    Talks on phone: Patient refused  Gets together: Patient refused    Attends religious service: Patient refused    Active member of club or organization: Patient refused    Attends meetings of clubs or organizations: Patient refused    Relationship status: Patient refused  . Intimate partner violence:    Fear of current or ex partner: Not on file    Emotionally abused: Not on file    Physically abused: Not on file    Forced sexual activity: Not on file  Other Topics Concern  . Not on file  Social History Narrative   Does regular exercise.   Has never worked, sister takes care of him    Family History:  Family History  Problem Relation Age of Onset  . Hypertension Mother   . Heart attack Mother        deceased from MI in 63s  . Hypertension Father   . Coronary artery disease Maternal Aunt   . Diabetes Maternal Aunt   . Coronary artery disease Maternal Uncle   . Diabetes Maternal Uncle   . Diabetes Paternal Aunt   . Coronary artery disease  Paternal Aunt   . Diabetes Paternal Uncle   . Coronary artery disease Paternal Uncle   . Heart attack Sister        deceased from MI in 22s    Medications:   Current Outpatient Medications on File Prior to Visit  Medication Sig Dispense Refill  . amLODipine (NORVASC) 5 MG tablet Take 5 mg by mouth daily.    Marland Kitchen aspirin EC 325 MG EC tablet Take 1 tablet (325 mg total) by mouth daily. 30 tablet 0  . atorvastatin (LIPITOR) 80 MG tablet Take 1 tablet (80 mg total) by mouth daily at 6 PM. 30 tablet 0  . cholecalciferol (VITAMIN D) 1000 units tablet Take 1,000 Units by mouth daily.    . clopidogrel (PLAVIX) 75 MG tablet Take 1 tablet (75 mg total) by mouth daily. 30 tablet 3  . donepezil (ARICEPT) 10 MG tablet Take 10 mg by mouth at bedtime.    Marland Kitchen lisinopril (PRINIVIL,ZESTRIL) 20 MG tablet Take 1 tablet (20 mg total) by mouth daily. 30 tablet 0  . metFORMIN (GLUCOPHAGE) 1000 MG tablet TAKE 0.5 TABLETS (500 MG TOTAL) BY MOUTH 2 (TWO) TIMES DAILY WITH A MEAL. (Patient taking differently: Take 500 mg by mouth 2 (two) times daily with a meal. ) 60 tablet 3  . paliperidone (INVEGA SUSTENNA) 156 MG/ML SUSP injection Inject 156 mg into the muscle every 28 (twenty-eight) days.    . polyethylene glycol (MIRALAX / GLYCOLAX) packet Take 17 g by mouth daily. 14 each 0   No current facility-administered medications on file prior to visit.     Allergies:  No Known Allergies   Physical Exam  Vitals:   11/08/17 1338  BP: 126/81  Pulse: (!) 101   There is no height or weight on file to calculate BMI. No exam data present  General: Pleasant middle-aged African-American male, frail, seated, in no evident distress Head: head normocephalic and atraumatic.   Neck: supple with no carotid or supraclavicular bruits Cardiovascular: regular rate and rhythm, no murmurs Musculoskeletal: Evidence of muscle wasting; unable to passively straighten legs due to pain Skin:  no rash/petichiae Vascular:  Normal pulses  all extremities  Neurologic Exam Mental Status: Awake but lethargic.  Pleasant moderate to severe dysarthria.  Disoriented to location and month.. Recent and remote memory intact. Attention span, concentration and fund of knowledge appropriate. Mood and affect appropriate.  Cranial Nerves: Fundoscopic exam reveals sharp disc margins. Pupils equal, briskly reactive to light. Extraocular movements full without nystagmus. Visual fields full to confrontation. Hearing intact. Facial sensation intact. Face, tongue, palate moves normally and symmetrically.  Motor: 4/5 in bilateral upper extremities with right being mildly weaker; 1/5 bilateral lower extremities; wrist drop present Sensory.: intact to touch , pinprick , position and vibratory sensation.  Coordination: Decreased dexterity in both upper extremities right  > left.   Gait and Station: Patient currently in wheelchair unable to ambulate Reflexes: Brisk and symmetric. Toes downgoing.    NIHSS  9 Modified Rankin  5    Diagnostic Data (Labs, Imaging, Testing)  Ct Angio Head W Or Wo Contrast Ct Angio Neck W Or Wo Contrast Result Date: 10/08/2017 IMPRESSION: CT HEAD: 1. Known acute small infarcts better seen on prior MRI. 2. Old bilateral basal ganglia and thalami infarcts. Old RIGHT cerebellar infarct. 3. Moderate chronic small vessel ischemic disease. 4. Mild parenchymal brain volume loss. Advanced brainstem and cerebellar atrophy. CTA NECK: 1. 50% stenosis LEFT internal carotid artery. 2. Chronically occluded RIGHT vertebral artery with minimal reconstitution distal V2 segment. CTA HEAD: 1. No emergent large vessel occlusion. 2. Moderate to severe stenosis bilateral ICAs. 3. Thready, severely stenotic RIGHT vertebral artery. Moderate stenosis proximal basilar artery. 4. Moderate cerebral artery atherosclerosis. Severe stenosis RIGHT P2 segment.   [name] Contrast Result Date: 10/07/2017 IMPRESSION: 1. Small foci of acute ischemia within  the posterior right lentiform nucleus (2-3 mm) and left corona radiata (7 mm). No hemorrhage or mass effect. 2. Chronic ischemic microangiopathy and old right cerebellar infarct  Echocardiogram:        10/06/2017                                       Study Conclusions - Left ventricle: The cavity size was mildly dilated. Wall thickness was increased in a pattern of mild LVH. Systolic function was normal. The estimated ejection fraction was in the range of 50% to 55%. Left ventricular diastolic function parameters were normal. - Atrial septum: No defect or patent foramen ovale was identified.  TEE: Study Conclusions - Left ventricle: Systolic function was normal. The estimated ejection fraction was in the range of 50% to 55%. Wall motion was normal; there were no regional wall motion abnormalities. - Aortic valve: No evidence of vegetation. - Mitral valve: No evidence of vegetation. - Left atrium: No evidence of thrombus in the atrial cavity or appendage. - Atrial septum: There was an atrial septal aneurysm. - Tricuspid valve: No evidence of vegetation. - Pulmonic valve: No evidence of vegetation. Impressions: - Normal LV function; atrial septal aneurysm; negative saline microcavitation study.             ASSESSMENT: [name] Williams is a 62 y.o. year old male here with right lentiform nucleus and left corona radiata infarct on 10/07/2017 secondary to small vessel disease during admission for sepsis.  Vascular risk factors include HTN, HLD, DM, PAD and CAS.  Consider critical illness polyneuropathy.    PLAN: -Continue aspirin 325 mg daily and clopidogrel 75 mg daily for 3 months total and then continue on Plavix alone and Lipitor 80 mg for secondary stroke prevention -Blood work obtained: LDH, CK, amylase, CMP, CBC and order placed for UA/cultures -Lower extremity venous Doppler ordered to assess for possible DVT with increased pain -EMG ordered to assess for  possible polyneuropathy -F/u with PCP regarding your HLD, HTN and DM management -Follow-up on 30-day cardiac monitor -repeat carotid ultrasound in 5 months (6 months post stroke) -Maintain strict control of hypertension with blood pressure goal below 130/90, diabetes with hemoglobin A1c goal below 6.5% and cholesterol with LDL cholesterol (bad cholesterol) goal below 70 mg/dL. I also advised the patient to eat a healthy diet with plenty of whole grains, cereals, fruits and vegetables, exercise regularly and maintain ideal body weight.  Follow up in 3 months or call earlier if needed  Greater than 50% time during this 45 minute consultation visit was spent on counseling and coordination of care about HLD, DM and HTN, discussion about risk benefit of anticoagulation and answering questions.  Reviewed possible diagnosis along with needed testing and lab work.   George Hugh, AGNP-BC  [facility] [address] [place], [place] [address]  Phone [phone] Fax [phone]   After appointment, recent lab test received that was done by [facility] MD Dr. Dorothe Pea.  WBC 17.9 RBC 3.86 Neutrophils 13.8 Monocytes 1.1 Glucose 437 CK 358

## 2017-11-09 ENCOUNTER — Telehealth: Payer: Self-pay | Admitting: Adult Health

## 2017-11-09 ENCOUNTER — Encounter: Payer: Self-pay | Admitting: Cardiology

## 2017-11-09 DIAGNOSIS — I5032 Chronic diastolic (congestive) heart failure: Secondary | ICD-10-CM

## 2017-11-09 HISTORY — DX: Chronic diastolic (congestive) heart failure: I50.32

## 2017-11-09 LAB — COMPREHENSIVE METABOLIC PANEL
A/G RATIO: 0.7 — AB (ref 1.2–2.2)
ALT: 31 IU/L (ref 0–44)
AST: 20 IU/L (ref 0–40)
Albumin: 2.8 g/dL — ABNORMAL LOW (ref 3.6–4.8)
Alkaline Phosphatase: 66 IU/L (ref 39–117)
BILIRUBIN TOTAL: 0.2 mg/dL (ref 0.0–1.2)
BUN/Creatinine Ratio: 23 (ref 10–24)
BUN: 21 mg/dL (ref 8–27)
CHLORIDE: 97 mmol/L (ref 96–106)
CO2: 24 mmol/L (ref 20–29)
Calcium: 9.2 mg/dL (ref 8.6–10.2)
Creatinine, Ser: 0.9 mg/dL (ref 0.76–1.27)
GFR calc Af Amer: 106 mL/min/{1.73_m2} (ref >59)
GFR calc non Af Amer: 92 mL/min/{1.73_m2} (ref >59)
Globulin, Total: 4 g/dL (ref 1.5–4.5)
Glucose: 376 mg/dL — ABNORMAL HIGH (ref 65–99)
Potassium: 4.7 mmol/L (ref 3.5–5.2)
SODIUM: 136 mmol/L (ref 134–144)
Total Protein: 6.8 g/dL (ref 6.0–8.5)

## 2017-11-09 LAB — CBC WITH DIFFERENTIAL/PLATELET
BASOS ABS: 0.1 10*3/uL (ref 0.0–0.2)
BASOS: 0 %
EOS (ABSOLUTE): 0.3 10*3/uL (ref 0.0–0.4)
Eos: 1 %
Hematocrit: 34.4 % — ABNORMAL LOW (ref 37.5–51.0)
Hemoglobin: 10.8 g/dL — ABNORMAL LOW (ref 13.0–17.7)
IMMATURE GRANS (ABS): 0.4 10*3/uL — AB (ref 0.0–0.1)
Immature Granulocytes: 2 %
LYMPHS ABS: 1.9 10*3/uL (ref 0.7–3.1)
Lymphs: 10 %
MCH: 27.3 pg (ref 26.6–33.0)
MCHC: 31.4 g/dL — AB (ref 31.5–35.7)
MCV: 87 fL (ref 79–97)
MONOS ABS: 1.7 10*3/uL — AB (ref 0.1–0.9)
Monocytes: 9 %
NEUTROS ABS: 14.6 10*3/uL — AB (ref 1.4–7.0)
Neutrophils: 78 %
PLATELETS: 307 10*3/uL (ref 150–379)
RBC: 3.95 x10E6/uL — ABNORMAL LOW (ref 4.14–5.80)
RDW: 13.7 % (ref 12.3–15.4)
WBC: 18.9 10*3/uL — ABNORMAL HIGH (ref 3.4–10.8)

## 2017-11-09 LAB — AMYLASE: AMYLASE: 54 U/L (ref 31–124)

## 2017-11-09 LAB — LACTATE DEHYDROGENASE: LDH: 210 IU/L (ref 121–224)

## 2017-11-09 LAB — CK: CK TOTAL: 310 U/L — AB (ref 24–204)

## 2017-11-09 NOTE — Telephone Encounter (Signed)
Called [facility] facility in regards to recent lab work with increased WBC at 18.9. Left message for nurse to return phone call.

## 2017-11-09 NOTE — Progress Notes (Deleted)
Cardiology Office Note:    Date:  11/09/2017   ID:  [name] Williams, DOB 03-May-1956, MRN [id number]  PCP:  [name] Williams, [name] Lynn, NP  Cardiologist:  Norman HerrlichBrian Zamier Eggebrecht, MD    Referring MD: [name] Williams, [name] Williams, *    ASSESSMENT:    1. Hypertensive heart disease with heart failure (HCC)   2. Chronic diastolic heart failure (HCC)   3. Cerebral thrombosis with cerebral infarction   4. Hyperlipidemia, unspecified hyperlipidemia type    PLAN:    In order of problems listed above:  1. ***   Next appointment: ***   Medication Adjustments/Labs and Tests Ordered: Current medicines are reviewed at length with the patient today.  Concerns regarding medicines are outlined above.  No orders of the defined types were placed in this encounter.  No orders of the defined types were placed in this encounter.   No chief complaint on file.   History of Present Illness:    [name] NeerJames Gilder is a 62 y.o. male with a hx of *** last seen ***. He was seen by neurology 11/09/17 George HughJessica VanSchaick, AGNP-BC [facility] [address] [place], [place] [id number]  ASSESSMENT: [name] NeerJames Kimmer is a 62 y.o. year old male here with right lentiform nucleus and left corona radiata infarct on 10/07/2017 secondary to small vessel disease during admission for sepsis.  Vascular risk factors include HTN, HLD, DM, PAD and CAS.  Consider critical illness polyneuropathy. PLAN: -Continue aspirin 325 mg daily and clopidogrel 75 mg daily for 3 months total and then continue on Plavix alone and Lipitor 80 mg for secondary stroke prevention -Blood work obtained: LDH, CK, amylase, CMP, CBC and order placed for UA/cultures -Lower extremity venous Doppler ordered to assess for possible DVT with increased pain -EMG ordered to assess for possible polyneuropathy -F/u with PCP regarding your HLD, HTN and DM management -Follow-up on 30-day cardiac monitor -repeat carotid ultrasound in 5 months (6 months  post stroke) -Maintain strict control of hypertension with blood pressure goal below 130/90, diabetes with hemoglobin A1c goal below 6.5% and cholesterol with LDL cholesterol (bad cholesterol) goal below 70 mg/dL. I also advised the patient to eat a healthy diet with plenty of whole grains, cereals, fruits and vegetables, exercise regularly and maintain ideal body weight. Follow up in 3 months or call earlier if needed  He was seen in cardiology consultation. Patient Profile   62 y.o. male admitted with falls and AMS. History of EF 45-50% by echo 2015 no clinical CHF, multiple strokes , Schizophrenia , Cognitive Deficits seen for abnormal ECG and one elevated troponin  Assessment & Plan   1. Troponin :  1/4 positive .54 no chest pain or history of CAD ECG no acute changes No indication for further cardiac testing or ischemic evaluation 2. Abnormal ECG poor R wave progression no acute ST/T wave changes 3. CHF: euvolemic echo from today reviewed and EF improved from 2015 50-55% Ok to resume low dose lisinopril per primary service Cr is now normal  Charlton HawsPeter Nishan, MD  10/06/2017, 12:00 PM    Compliance with diet, lifestyle and medications: *** Past Medical History:  Diagnosis Date  . Chronic systolic congestive heart failure (HCC)   . Diabetes mellitus   . History of stroke   . Hypertension   . Stroke [facility](HCC)     Past Surgical History:  Procedure Laterality Date  . TEE WITHOUT CARDIOVERSION N/A 10/10/2017   Procedure: TRANSESOPHAGEAL ECHOCARDIOGRAM (TEE);  Surgeon: Lewayne Buntingrenshaw, Jerry Haugen S, MD;  [facility];  Service: Cardiovascular;  Laterality: N/A;    Current Medications: No outpatient medications have been marked as taking for the 11/10/17 encounter (Appointment) with Baldo Daub, MD.     Allergies:   Patient has no known allergies.   Social History   Socioeconomic History  . Marital status: Legally Separated    Spouse name: Not on file  . Number of children: 0  .  Years of education: 12th  . Highest education level: Not on file  Occupational History  . Not on file  Social Needs  . Financial resource strain: Not on file  . Food insecurity:    Worry: Not on file    Inability: Not on file  . Transportation needs:    Medical: Not on file    Non-medical: Not on file  Tobacco Use  . Smoking status: Current Every Day Smoker    Packs/day: 1.00    Years: 10.00    Pack years: 10.00    Types: Cigarettes  . Smokeless tobacco: Never Used  Substance and Sexual Activity  . Alcohol use: No  . Drug use: Not Currently    Comment: marijuana occassionally   . Sexual activity: Never  Lifestyle  . Physical activity:    Days per week: Patient refused    Minutes per session: Patient refused  . Stress: Not on file  Relationships  . Social connections:    Talks on phone: Patient refused    Gets together: Patient refused    Attends religious service: Patient refused    Active member of club or organization: Patient refused    Attends meetings of clubs or organizations: Patient refused    Relationship status: Patient refused  Other Topics Concern  . Not on file  Social History Narrative   Does regular exercise.   Has never worked, sister takes care of him     Family History: The patient's ***family history includes Coronary artery disease in his maternal aunt, maternal uncle, paternal aunt, and paternal uncle; Diabetes in his maternal aunt, maternal uncle, paternal aunt, and paternal uncle; Heart attack in his mother and sister; Hypertension in his father and mother. ROS:   Please see the history of present illness.    All other systems reviewed and are negative.  EKGs/Labs/Other Studies Reviewed:    The following studies were reviewed today: Cardiac monitor strips recent admission All SRTH one 4 complex tun of PVC's, no AF or significant bradycardia EKG:  EKG ordered today.  The ekg ordered today demonstrates *** Echo 10/10/17: - Left ventricle:  Systolic function was normal. The estimated   ejection fraction was in the range of 50% to 55%. Wall motion was   normal; there were no regional wall motion abnormalities. - Aortic valve: No evidence of vegetation. - Mitral valve: No evidence of vegetation. - Left atrium: No evidence of thrombus in the atrial cavity or   appendage. - Atrial septum: There was an atrial septal aneurysm. - Tricuspid valve: No evidence of vegetation. - Pulmonic valve: No evidence of vegetation. Impressions: - Normal LV function; atrial septal aneurysm; negative saline   microcavitation study.  Recent Labs: 10/04/2017: TSH 0.380 10/10/2017: Magnesium 1.4 11/08/2017: ALT 31; BUN 21; Creatinine, Ser 0.90; Hemoglobin 10.8; Platelets 307; Potassium 4.7; Sodium 136  Recent Lipid Panel    Component Value Date/Time   CHOL 123 10/07/2017 0715   TRIG 119 10/07/2017 0715   HDL 20 (L) 10/07/2017 0715   CHOLHDL 6.2 10/07/2017 0715   VLDL 24  10/07/2017 0715   LDLCALC 79 10/07/2017 0715    Physical Exam:    VS:  There were no vitals taken for this visit.    Wt Readings from Last 3 Encounters:  10/10/17 150 lb (68 kg)  07/17/14 163 lb 12.8 oz (74.3 kg)  06/26/14 158 lb 12.8 oz (72 kg)     GEN: *** Well nourished, well developed in no acute distress HEENT: Normal NECK: No JVD; No carotid bruits LYMPHATICS: No lymphadenopathy CARDIAC: ***RRR, no murmurs, rubs, gallops RESPIRATORY:  Clear to auscultation without rales, wheezing or rhonchi  ABDOMEN: Soft, non-tender, non-distended MUSCULOSKELETAL:  No edema; No deformity  SKIN: Warm and dry NEUROLOGIC:  Alert and oriented x 3 PSYCHIATRIC:  Normal affect    Signed, Norman Herrlich, MD  11/09/2017 12:43 PM    [facility]

## 2017-11-10 ENCOUNTER — Other Ambulatory Visit (HOSPITAL_COMMUNITY): Payer: Self-pay | Admitting: Family Medicine

## 2017-11-10 ENCOUNTER — Ambulatory Visit: Payer: Medicare (Managed Care) | Admitting: Cardiology

## 2017-11-10 DIAGNOSIS — R609 Edema, unspecified: Secondary | ICD-10-CM

## 2017-11-10 DIAGNOSIS — R0989 Other specified symptoms and signs involving the circulatory and respiratory systems: Secondary | ICD-10-CM

## 2017-11-10 NOTE — Addendum Note (Signed)
Addended by: George HughVANSCHAICK, Emmory Solivan on: 11/10/2017 09:41 AM   Modules accepted: Orders

## 2017-11-14 ENCOUNTER — Ambulatory Visit (HOSPITAL_COMMUNITY)
Admission: RE | Admit: 2017-11-14 | Discharge: 2017-11-14 | Disposition: A | Discharge status: Home / Self Care | Payer: Medicare (Managed Care) | Source: Ambulatory Visit | Attending: Infectious Diseases | Admitting: Infectious Diseases

## 2017-11-14 ENCOUNTER — Telehealth: Payer: Self-pay | Admitting: Adult Health

## 2017-11-14 DIAGNOSIS — R609 Edema, unspecified: Secondary | ICD-10-CM

## 2017-11-14 NOTE — Telephone Encounter (Signed)
Attempted to call Dr. Dorothe PeaKoehler (PCP) regarding lab results. I did forward lab results last week. No answer. Message left to return phone call.

## 2017-11-14 NOTE — Telephone Encounter (Signed)
Attempted to call [name] Williams today regarding lab results from last week. Was unable to reach a nurse after waiting for approx 15 mintues on hold. I informed the front desk that I had urgent lab results but continued to have no answer.

## 2017-11-15 NOTE — Telephone Encounter (Signed)
Dr. Dorothe PeaKoehler is returning your call and can be reached at [phone].

## 2017-11-15 NOTE — Telephone Encounter (Signed)
Spoke to Dr. Dorothe PeaKoehler regarding [name] recent lab results and our recommendations along with the tests that were ordered. Patient did have bilateral lower extrem dopplers completed yesterday (11/14/17) - no results at this time. EMG/NCV scheduled for 11/28/17 with Dr. Anne HahnWillis. Requested patient be placed on cancellation list with Dr. Anne HahnWillis as, per Dr. Dorothe PeaKoehler, [name] has been experiencing increase in contractures and now pressure ulcers. Dr. Dorothe PeaKoehler considering starting baclofen and d/c statin due to increased CK levels. Dr. Dorothe PeaKoehler will be repeating labs and obtaining UA with reflex to look for possible signs of infection.

## 2017-11-16 ENCOUNTER — Ambulatory Visit: Payer: Medicare Other | Admitting: Adult Health

## 2017-11-17 ENCOUNTER — Ambulatory Visit: Payer: Medicare (Managed Care) | Admitting: Cardiology

## 2017-11-18 ENCOUNTER — Encounter (HOSPITAL_COMMUNITY): Payer: Self-pay | Admitting: Emergency Medicine

## 2017-11-18 ENCOUNTER — Emergency Department (HOSPITAL_COMMUNITY): Payer: Medicare (Managed Care)

## 2017-11-18 ENCOUNTER — Ambulatory Visit (HOSPITAL_COMMUNITY): Payer: Medicare (Managed Care)

## 2017-11-18 ENCOUNTER — Encounter (HOSPITAL_COMMUNITY): Payer: Self-pay

## 2017-11-18 ENCOUNTER — Other Ambulatory Visit: Payer: Self-pay

## 2017-11-18 ENCOUNTER — Inpatient Hospital Stay (HOSPITAL_COMMUNITY)
Admission: EM | Admit: 2017-11-18 | Discharge: 2017-11-23 | DRG: 871 | Disposition: A | Discharge status: Hospice | Payer: Medicare (Managed Care) | Attending: Internal Medicine | Admitting: Internal Medicine

## 2017-11-18 DIAGNOSIS — I110 Hypertensive heart disease with heart failure: Secondary | ICD-10-CM | POA: Diagnosis present

## 2017-11-18 DIAGNOSIS — L97529 Non-pressure chronic ulcer of other part of left foot with unspecified severity: Secondary | ICD-10-CM | POA: Diagnosis not present

## 2017-11-18 DIAGNOSIS — Z8249 Family history of ischemic heart disease and other diseases of the circulatory system: Secondary | ICD-10-CM

## 2017-11-18 DIAGNOSIS — E11621 Type 2 diabetes mellitus with foot ulcer: Secondary | ICD-10-CM | POA: Diagnosis not present

## 2017-11-18 DIAGNOSIS — J189 Pneumonia, unspecified organism: Secondary | ICD-10-CM | POA: Diagnosis not present

## 2017-11-18 DIAGNOSIS — E876 Hypokalemia: Secondary | ICD-10-CM | POA: Diagnosis present

## 2017-11-18 DIAGNOSIS — R4182 Altered mental status, unspecified: Secondary | ICD-10-CM | POA: Diagnosis present

## 2017-11-18 DIAGNOSIS — Z7982 Long term (current) use of aspirin: Secondary | ICD-10-CM

## 2017-11-18 DIAGNOSIS — H532 Diplopia: Secondary | ICD-10-CM | POA: Diagnosis present

## 2017-11-18 DIAGNOSIS — I5020 Unspecified systolic (congestive) heart failure: Secondary | ICD-10-CM | POA: Diagnosis not present

## 2017-11-18 DIAGNOSIS — L97419 Non-pressure chronic ulcer of right heel and midfoot with unspecified severity: Secondary | ICD-10-CM | POA: Diagnosis present

## 2017-11-18 DIAGNOSIS — J9811 Atelectasis: Secondary | ICD-10-CM | POA: Diagnosis present

## 2017-11-18 DIAGNOSIS — L8990 Pressure ulcer of unspecified site, unspecified stage: Secondary | ICD-10-CM | POA: Diagnosis not present

## 2017-11-18 DIAGNOSIS — I10 Essential (primary) hypertension: Secondary | ICD-10-CM | POA: Diagnosis not present

## 2017-11-18 DIAGNOSIS — R7989 Other specified abnormal findings of blood chemistry: Secondary | ICD-10-CM | POA: Diagnosis not present

## 2017-11-18 DIAGNOSIS — L89150 Pressure ulcer of sacral region, unstageable: Secondary | ICD-10-CM | POA: Diagnosis not present

## 2017-11-18 DIAGNOSIS — Z66 Do not resuscitate: Secondary | ICD-10-CM | POA: Diagnosis not present

## 2017-11-18 DIAGNOSIS — Z7984 Long term (current) use of oral hypoglycemic drugs: Secondary | ICD-10-CM | POA: Diagnosis not present

## 2017-11-18 DIAGNOSIS — E870 Hyperosmolality and hypernatremia: Secondary | ICD-10-CM | POA: Diagnosis present

## 2017-11-18 DIAGNOSIS — H5120 Internuclear ophthalmoplegia, unspecified eye: Secondary | ICD-10-CM | POA: Diagnosis present

## 2017-11-18 DIAGNOSIS — R531 Weakness: Secondary | ICD-10-CM | POA: Diagnosis not present

## 2017-11-18 DIAGNOSIS — G6281 Critical illness polyneuropathy: Secondary | ICD-10-CM | POA: Diagnosis present

## 2017-11-18 DIAGNOSIS — G319 Degenerative disease of nervous system, unspecified: Secondary | ICD-10-CM | POA: Diagnosis present

## 2017-11-18 DIAGNOSIS — F209 Schizophrenia, unspecified: Secondary | ICD-10-CM | POA: Diagnosis not present

## 2017-11-18 DIAGNOSIS — Z7902 Long term (current) use of antithrombotics/antiplatelets: Secondary | ICD-10-CM | POA: Diagnosis not present

## 2017-11-18 DIAGNOSIS — I69354 Hemiplegia and hemiparesis following cerebral infarction affecting left non-dominant side: Secondary | ICD-10-CM | POA: Diagnosis not present

## 2017-11-18 DIAGNOSIS — A419 Sepsis, unspecified organism: Secondary | ICD-10-CM | POA: Diagnosis not present

## 2017-11-18 DIAGNOSIS — R000 Tachycardia, unspecified: Secondary | ICD-10-CM | POA: Diagnosis not present

## 2017-11-18 DIAGNOSIS — Z888 Allergy status to other drugs, medicaments and biological substances status: Secondary | ICD-10-CM

## 2017-11-18 DIAGNOSIS — J690 Pneumonitis due to inhalation of food and vomit: Secondary | ICD-10-CM | POA: Diagnosis present

## 2017-11-18 DIAGNOSIS — L97429 Non-pressure chronic ulcer of left heel and midfoot with unspecified severity: Secondary | ICD-10-CM | POA: Diagnosis present

## 2017-11-18 DIAGNOSIS — G9340 Encephalopathy, unspecified: Secondary | ICD-10-CM | POA: Diagnosis present

## 2017-11-18 DIAGNOSIS — M6250 Muscle wasting and atrophy, not elsewhere classified, unspecified site: Secondary | ICD-10-CM | POA: Diagnosis present

## 2017-11-18 DIAGNOSIS — E1151 Type 2 diabetes mellitus with diabetic peripheral angiopathy without gangrene: Secondary | ICD-10-CM | POA: Diagnosis not present

## 2017-11-18 DIAGNOSIS — I5042 Chronic combined systolic (congestive) and diastolic (congestive) heart failure: Secondary | ICD-10-CM | POA: Diagnosis present

## 2017-11-18 DIAGNOSIS — I69393 Ataxia following cerebral infarction: Secondary | ICD-10-CM | POA: Diagnosis not present

## 2017-11-18 DIAGNOSIS — N390 Urinary tract infection, site not specified: Secondary | ICD-10-CM | POA: Diagnosis not present

## 2017-11-18 DIAGNOSIS — M21332 Wrist drop, left wrist: Secondary | ICD-10-CM | POA: Diagnosis present

## 2017-11-18 DIAGNOSIS — E785 Hyperlipidemia, unspecified: Secondary | ICD-10-CM | POA: Diagnosis not present

## 2017-11-18 DIAGNOSIS — Z7189 Other specified counseling: Secondary | ICD-10-CM | POA: Diagnosis not present

## 2017-11-18 DIAGNOSIS — F17210 Nicotine dependence, cigarettes, uncomplicated: Secondary | ICD-10-CM | POA: Diagnosis present

## 2017-11-18 DIAGNOSIS — M21331 Wrist drop, right wrist: Secondary | ICD-10-CM | POA: Diagnosis present

## 2017-11-18 DIAGNOSIS — L97408 Non-pressure chronic ulcer of unspecified heel and midfoot with other specified severity: Secondary | ICD-10-CM | POA: Diagnosis not present

## 2017-11-18 DIAGNOSIS — I214 Non-ST elevation (NSTEMI) myocardial infarction: Secondary | ICD-10-CM

## 2017-11-18 DIAGNOSIS — E119 Type 2 diabetes mellitus without complications: Secondary | ICD-10-CM | POA: Diagnosis not present

## 2017-11-18 DIAGNOSIS — L0390 Cellulitis, unspecified: Secondary | ICD-10-CM | POA: Diagnosis not present

## 2017-11-18 DIAGNOSIS — Z515 Encounter for palliative care: Secondary | ICD-10-CM | POA: Diagnosis present

## 2017-11-18 DIAGNOSIS — Z79899 Other long term (current) drug therapy: Secondary | ICD-10-CM

## 2017-11-18 DIAGNOSIS — I213 ST elevation (STEMI) myocardial infarction of unspecified site: Secondary | ICD-10-CM | POA: Diagnosis present

## 2017-11-18 DIAGNOSIS — Z833 Family history of diabetes mellitus: Secondary | ICD-10-CM

## 2017-11-18 DIAGNOSIS — I255 Ischemic cardiomyopathy: Secondary | ICD-10-CM | POA: Diagnosis present

## 2017-11-18 DIAGNOSIS — L97519 Non-pressure chronic ulcer of other part of right foot with unspecified severity: Secondary | ICD-10-CM | POA: Diagnosis not present

## 2017-11-18 DIAGNOSIS — L97509 Non-pressure chronic ulcer of other part of unspecified foot with unspecified severity: Secondary | ICD-10-CM | POA: Diagnosis not present

## 2017-11-18 DIAGNOSIS — Z7401 Bed confinement status: Secondary | ICD-10-CM

## 2017-11-18 DIAGNOSIS — R778 Other specified abnormalities of plasma proteins: Secondary | ICD-10-CM

## 2017-11-18 DIAGNOSIS — L989 Disorder of the skin and subcutaneous tissue, unspecified: Secondary | ICD-10-CM | POA: Diagnosis not present

## 2017-11-18 LAB — URINALYSIS, ROUTINE W REFLEX MICROSCOPIC
Bilirubin Urine: NEGATIVE
Glucose, UA: >=500 mg/dL — AB
Ketones, ur: 5 mg/dL — AB
Nitrite: NEGATIVE
PROTEIN: NEGATIVE mg/dL
SPECIFIC GRAVITY, URINE: 1.02 (ref 1.005–1.030)
pH: 5 (ref 5.0–8.0)

## 2017-11-18 LAB — COMPREHENSIVE METABOLIC PANEL
ALBUMIN: 1.9 g/dL — AB (ref 3.5–5.0)
ALT: 30 U/L (ref 17–63)
ANION GAP: 15 (ref 5–15)
AST: 33 U/L (ref 15–41)
Alkaline Phosphatase: 67 U/L (ref 38–126)
BILIRUBIN TOTAL: 0.7 mg/dL (ref 0.3–1.2)
BUN: 28 mg/dL — AB (ref 6–20)
CHLORIDE: 109 mmol/L (ref 101–111)
CO2: 22 mmol/L (ref 22–32)
Calcium: 8.9 mg/dL (ref 8.9–10.3)
Creatinine, Ser: 1.06 mg/dL (ref 0.61–1.24)
GFR calc Af Amer: >60 mL/min (ref >60)
GFR calc non Af Amer: >60 mL/min (ref >60)
GLUCOSE: 367 mg/dL — AB (ref 65–99)
POTASSIUM: 4.4 mmol/L (ref 3.5–5.1)
SODIUM: 146 mmol/L — AB (ref 135–145)
TOTAL PROTEIN: 6.1 g/dL — AB (ref 6.5–8.1)

## 2017-11-18 LAB — CBC WITH DIFFERENTIAL/PLATELET
BASOS ABS: 0 10*3/uL (ref 0.0–0.1)
Basophils Relative: 0 %
EOS PCT: 0 %
Eosinophils Absolute: 0 10*3/uL (ref 0.0–0.7)
HEMATOCRIT: 31.4 % — AB (ref 39.0–52.0)
Hemoglobin: 9.8 g/dL — ABNORMAL LOW (ref 13.0–17.0)
LYMPHS ABS: 1.5 10*3/uL (ref 0.7–4.0)
LYMPHS PCT: 8 %
MCH: 26.8 pg (ref 26.0–34.0)
MCHC: 31.2 g/dL (ref 30.0–36.0)
MCV: 86 fL (ref 78.0–100.0)
MONO ABS: 1.8 10*3/uL — AB (ref 0.1–1.0)
MONOS PCT: 9 %
NEUTROS ABS: 16 10*3/uL — AB (ref 1.7–7.7)
Neutrophils Relative %: 83 %
PLATELETS: 375 10*3/uL (ref 150–400)
RBC: 3.65 MIL/uL — ABNORMAL LOW (ref 4.22–5.81)
RDW: 14.3 % (ref 11.5–15.5)
WBC: 19.4 10*3/uL — ABNORMAL HIGH (ref 4.0–10.5)

## 2017-11-18 LAB — CBG MONITORING, ED: GLUCOSE-CAPILLARY: 351 mg/dL — AB (ref 65–99)

## 2017-11-18 LAB — I-STAT TROPONIN, ED: TROPONIN I, POC: 2.62 ng/mL — AB (ref 0.00–0.08)

## 2017-11-18 LAB — I-STAT CG4 LACTIC ACID, ED
LACTIC ACID, VENOUS: 1.21 mmol/L (ref 0.5–1.9)
Lactic Acid, Venous: 1.83 mmol/L (ref 0.5–1.9)

## 2017-11-18 LAB — TROPONIN I: Troponin I: 2.36 ng/mL (ref <0.03)

## 2017-11-18 LAB — GLUCOSE, CAPILLARY: GLUCOSE-CAPILLARY: 332 mg/dL — AB (ref 65–99)

## 2017-11-18 MED ORDER — SODIUM CHLORIDE 0.9 % IV BOLUS (SEPSIS)
1000.0000 mL | Freq: Once | INTRAVENOUS | Status: AC
  Administered 2017-11-18: 1000 mL via INTRAVENOUS

## 2017-11-18 MED ORDER — INSULIN ASPART 100 UNIT/ML ~~LOC~~ SOLN
0.0000 [IU] | Freq: Three times a day (TID) | SUBCUTANEOUS | Status: DC
  Administered 2017-11-19: 8 [IU] via SUBCUTANEOUS
  Administered 2017-11-19: 11 [IU] via SUBCUTANEOUS
  Administered 2017-11-19: 3 [IU] via SUBCUTANEOUS
  Administered 2017-11-20 (×3): 5 [IU] via SUBCUTANEOUS

## 2017-11-18 MED ORDER — SODIUM CHLORIDE 0.9 % IV BOLUS (SEPSIS): 250.0000 mL | Freq: Once | INTRAVENOUS | Status: DC

## 2017-11-18 MED ORDER — VANCOMYCIN HCL IN DEXTROSE 750-5 MG/150ML-% IV SOLN
750.0000 mg | Freq: Two times a day (BID) | INTRAVENOUS | Status: DC
  Administered 2017-11-19: 750 mg via INTRAVENOUS
  Filled 2017-11-18: qty 150

## 2017-11-18 MED ORDER — DONEPEZIL HCL 10 MG PO TABS
10.0000 mg | ORAL_TABLET | Freq: Every day | ORAL | Status: DC
  Administered 2017-11-19 – 2017-11-22 (×3): 10 mg via ORAL
  Filled 2017-11-18 (×4): qty 1

## 2017-11-18 MED ORDER — PIPERACILLIN-TAZOBACTAM 3.375 G IVPB 30 MIN
3.3750 g | Freq: Once | INTRAVENOUS | Status: AC
  Administered 2017-11-18: 3.375 g via INTRAVENOUS
  Filled 2017-11-18: qty 50

## 2017-11-18 MED ORDER — ACETAMINOPHEN 325 MG PO TABS
650.0000 mg | ORAL_TABLET | Freq: Once | ORAL | Status: AC
Start: 2017-11-18 — End: 2017-11-18
  Administered 2017-11-18: 650 mg via ORAL
  Filled 2017-11-18: qty 2

## 2017-11-18 MED ORDER — INSULIN ASPART 100 UNIT/ML ~~LOC~~ SOLN: 4.0000 [IU] | Freq: Three times a day (TID) | SUBCUTANEOUS | Status: DC

## 2017-11-18 MED ORDER — ASPIRIN 81 MG PO CHEW
81.0000 mg | CHEWABLE_TABLET | Freq: Every day | ORAL | Status: DC
  Administered 2017-11-19 – 2017-11-21 (×3): 81 mg via ORAL
  Filled 2017-11-18 (×3): qty 1

## 2017-11-18 MED ORDER — PIPERACILLIN-TAZOBACTAM 3.375 G IVPB
3.3750 g | Freq: Three times a day (TID) | INTRAVENOUS | Status: DC
  Administered 2017-11-18 – 2017-11-23 (×14): 3.375 g via INTRAVENOUS
  Filled 2017-11-18 (×15): qty 50

## 2017-11-18 MED ORDER — VANCOMYCIN HCL IN DEXTROSE 1-5 GM/200ML-% IV SOLN: 1000.0000 mg | Freq: Once | INTRAVENOUS | Status: DC

## 2017-11-18 MED ORDER — HEPARIN (PORCINE) IN NACL 100-0.45 UNIT/ML-% IJ SOLN
1300.0000 [IU]/h | INTRAMUSCULAR | Status: DC
  Administered 2017-11-18: 800 [IU]/h via INTRAVENOUS
  Filled 2017-11-18 (×2): qty 250

## 2017-11-18 MED ORDER — FLUDROCORTISONE ACETATE 0.1 MG PO TABS
0.1000 mg | ORAL_TABLET | Freq: Every day | ORAL | Status: DC
  Administered 2017-11-19 – 2017-11-20 (×2): 0.1 mg via ORAL
  Filled 2017-11-18 (×3): qty 1

## 2017-11-18 MED ORDER — SODIUM CHLORIDE 0.9 % IV BOLUS (SEPSIS): 1000.0000 mL | Freq: Once | INTRAVENOUS | Status: DC

## 2017-11-18 MED ORDER — ACETAMINOPHEN 650 MG RE SUPP: 650.0000 mg | Freq: Four times a day (QID) | RECTAL | Status: DC | PRN

## 2017-11-18 MED ORDER — ACETAMINOPHEN 325 MG PO TABS
650.0000 mg | ORAL_TABLET | Freq: Four times a day (QID) | ORAL | Status: DC | PRN
  Administered 2017-11-20: 650 mg via ORAL
  Filled 2017-11-18: qty 2

## 2017-11-18 MED ORDER — INSULIN ASPART 100 UNIT/ML ~~LOC~~ SOLN
0.0000 [IU] | Freq: Every day | SUBCUTANEOUS | Status: DC
  Administered 2017-11-19: 4 [IU] via SUBCUTANEOUS
  Administered 2017-11-19: 2 [IU] via SUBCUTANEOUS

## 2017-11-18 MED ORDER — SODIUM CHLORIDE 0.9 % IV BOLUS
1000.0000 mL | Freq: Once | INTRAVENOUS | Status: AC
  Administered 2017-11-18: 1000 mL via INTRAVENOUS

## 2017-11-18 MED ORDER — VANCOMYCIN HCL 10 G IV SOLR
1250.0000 mg | Freq: Once | INTRAVENOUS | Status: AC
  Administered 2017-11-18: 1250 mg via INTRAVENOUS
  Filled 2017-11-18: qty 1250

## 2017-11-18 MED ORDER — HEPARIN BOLUS VIA INFUSION
4000.0000 [IU] | Freq: Once | INTRAVENOUS | Status: AC
  Administered 2017-11-18: 4000 [IU] via INTRAVENOUS
  Filled 2017-11-18: qty 4000

## 2017-11-18 MED ORDER — INSULIN ASPART 100 UNIT/ML ~~LOC~~ SOLN: 5.0000 [IU] | Freq: Once | SUBCUTANEOUS | Status: DC

## 2017-11-18 MED ORDER — BACLOFEN 20 MG PO TABS
20.0000 mg | ORAL_TABLET | Freq: Four times a day (QID) | ORAL | Status: DC
  Administered 2017-11-19 – 2017-11-20 (×5): 20 mg via ORAL
  Filled 2017-11-18 (×8): qty 1

## 2017-11-18 MED ORDER — ENOXAPARIN SODIUM 40 MG/0.4ML ~~LOC~~ SOLN: 40.0000 mg | SUBCUTANEOUS | Status: DC

## 2017-11-18 NOTE — Progress Notes (Addendum)
Pharmacy Antibiotic Note  [name] Williams is a 62 y.o. male admitted on 11/18/2017 with sepsis.  Pharmacy has been consulted for Zosyn and vancomycin dosing. WBC 19.4. LA 1.83. SCr 1.06. CrCl ~ 60-65 mL/min   Plan: -Zosyn 3.375 gm IV Q 8 hours (EI infusion) -Vancomycin 1250 mg IV once, then vancomycin 750 mg IV Q 12 hours -Monitor CBC, renal fx, cultures and clinical progress -[facility]   Height: 6' (182.9 cm) Weight: 150 lb (68 kg) IBW/kg (Calculated) : 77.6  Temp (24hrs), Avg:101 F (38.3 C), Min:101 F (38.3 C), Max:101 F (38.3 C)  No results for input(s): WBC, CREATININE, LATICACIDVEN, VANCOTROUGH, VANCOPEAK, VANCORANDOM, GENTTROUGH, GENTPEAK, GENTRANDOM, TOBRATROUGH, TOBRAPEAK, TOBRARND, AMIKACINPEAK, AMIKACINTROU, AMIKACIN in the last 168 hours.  Estimated Creatinine Clearance: 82.9 mL/min (by C-G formula based on SCr of 0.9 mg/dL).    Allergies  Allergen Reactions  . Atorvastatin     "leg aches"    Antimicrobials this admission: Vanc 4/5 >>  Zosyn 4/5 >>   Dose adjustments this admission: None   Microbiology results: 4/5 BCx>>  Thank you for allowing pharmacy to be a part of this patient's care.  Vinnie LevelBenjamin Jerene Yeager, PharmD., BCPS Clinical Pharmacist Clinical phone for 11/18/17: [phone]  Addendum: Pharmacy consulted to start IV heparin for ACS. Patient is not on any anticoagulants prior to admission. H/H low. Plt wnl. Give heparin 4000 units IV, then start IV heparin infusion at 800 units/hr. F/u 6 hr HL, daily CBC and monitor for s/s of bleeding  Vinnie LevelBenjamin Baila Rouse, PharmD., BCPS Clinical Pharmacist

## 2017-11-18 NOTE — ED Triage Notes (Signed)
Pt arrived by EMS from [facility] office for AMS w/ fever and off baseline. CBG: 470. Pt is currently staying at [facility].

## 2017-11-18 NOTE — ED Notes (Signed)
Patient transported to CT 

## 2017-11-18 NOTE — ED Notes (Signed)
Admitting at bedside 

## 2017-11-18 NOTE — ED Notes (Signed)
Please call pts sister, Delories HeinzJoanna Ryner if needed.  [phone]

## 2017-11-18 NOTE — ED Notes (Signed)
Heparin requested from pharmacy.

## 2017-11-18 NOTE — ED Notes (Signed)
RN Duwayne Heckanielle aware heparin not received by pharmacy and therefore not started

## 2017-11-18 NOTE — H&P (Signed)
Date: 11/18/2017               Patient Name:  [name] Williams MRN: [id number]  DOB: 11-24-1955 Age / Sex: 62 y.o., male   PCP: Annie Sable, NP         Medical Service: Internal Medicine Teaching Service         Attending Physician: Dr. Pricilla Loveless, MD    First Contact: Dr. Frances Furbish Pager: [phone]  Second Contact: Dr. Mikey Bussing Pager: [phone]       After Hours (After 5p/  First Contact Pager: [phone]  weekends / holidays): Second Contact Pager: [phone]   Chief Complaint: Altered Mental Status   History of Present Illness: Mr. Nephew is a 44 yo M with a past medical history of CVA (2015, Feb 2019), HTN, HLD, DM, CVA, schizophrenia who presented to the ED from SNF due to altered mental status and fever. No family present at bedside, history obtained through chart review and review of physical records sent by [facility]CP.   Patient was admitted in February 2019 for similar presentation of altered mental status and fever and was found to be bacteremic from urinary source with Klebsiella.  The admission was complicated by acute CVA.  Since discharge, he has been followed by [facility] and residing in SNF with progressive decline described in outpatient notes.  It appears he has been undergoing treatment with ceftriaxone over the last several days for urinary tract infection and left-sided pneumonia, elevated white counts noted as an outpatient.  A family member previously noted to ED provider that he has had intermittent cough recently.  Patient unable to reliably contribute history or localizing symptoms such as shortness of breath, dysuria, chest pain.  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  In the ED, T 101, HR 124, BP 121/95, 98% on RA. Labs notable for WBC 19.4, Hgb 9.8, Albumin 1.9, LA 1.83, Troponin 2.62. Head CT did not show acute intracranial pathology. He received 500 cc IVF, Tylenol and was started  on broad spectrum antibiotics with Vanc/Zosyn and was admitted for further management.       Meds:  No outpatient medications have been marked as taking for the 11/18/17 encounter [facility] Encounter).     Allergies: Allergies as of 11/18/2017 - Review Complete 11/14/2017  Allergen Reaction Noted  . Atorvastatin  11/14/2017   Past Medical History:  Diagnosis Date  . Acute pyelonephritis   . Altered mental status, unspecified 10/04/2017  . 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 Bacteremia   . 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 Cerebral thrombosis with cerebral infarction 10/08/2017  . Chronic diastolic heart failure (HCC) 11/09/2017  . Chronic systolic congestive heart failure (HCC)   . CVA (cerebral infarction) 06/26/2014  . Diabetes mellitus   . Diabetes mellitus (HCC)   . Diplopia   . Dizziness   . DYSLIPIDEMIA 04/08/2008   Qualifier: Diagnosis of  By: Sondra Barges MD, Zac    . Elevated troponin   . Fever   . History of stroke   . HLD (hyperlipidemia) 04/08/2008   Centricity Description: DIABETES-TYPE 2 Qualifier: Diagnosis of  By: Sondra Barges MD, Zac   Centricity Description: DIABETES MELLITUS, TYPE II Qualifier: Diagnosis of  By: Sondra Barges MD, Zac    . Hypertension   . Hypertensive heart disease with heart failure (HCC)   . 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 Intracranial vascular stenosis 06/23/2014  . Leukocytosis   .  Preventative health care 08/29/2013  . Schizophrenia (HCC) 10/31/2014  . Sensory deficit, left 06/26/2014  . Sepsis (HCC)   . Stroke (HCC)   . Stroke-like symptom 06/23/2014  . TOBACCO ABUSE 04/08/2008   Qualifier: Diagnosis of  By: Sondra BargesForsey MD, Zac      Family History: Unable to obtain due to pt mental status   Social History: Unable to obtain due to pt mental status   Review of Systems: Unable to obtain due to pt mental status   Physical Exam: Blood pressure 129/84, pulse (!) 117, temperature (!) 101 F (38.3 C), temperature source Rectal, resp. rate 13,  height 6' (1.829 m), weight 150 lb (68 kg), SpO2 98 %. General: Frail gentleman resting in bed, awake and alert but not participatory in exam or history  Head: Normocephalic, atraumatic  Eyes: Symmetric small pupils, unable to participate with EOM ENT: Dry mucus membranes, no JVD appreciated  CV: Tachycardic, regular rhythm, no murmur appreciated  Resp: Limited auscultation of posterior breath sounds, clear anterior breath sounds bilaterally, normal work of breathing, no distress  Abd: Soft, +BS, no apparent tenderness to palpation  Extr: No LE edema, flexed extremities with discomfort with passive movement, likely chronic contracture. Unable to assess strength Neuro: Alert and oriented to place and self, intermittently follows commands, answering most questions with "no" Skin: Warm, dry.  Several areas of bandage, presumed skin breakdown.  EKG: personally reviewed my interpretation is tachycardic, intervals appropriate, T wave inversion to V5, V5 which previously appeared biphasic.   CXR: personally reviewed my interpretation is pt rotated, Asymmetric haziness to L base.    Assessment & Plan by Problem:  Fever, Altered Mental Status  Pt presenting with fever, AMS with WBC elevated to 19.4, febrile to 101 and has been recently being treated as an outpatient for LLL pneumonia and UTI with ceftriaxone per PCP documentation, urine culture not available with current records. CXR with LLL haziness consistent with records, U/A here with moderate leukocytes, negative nitrites, few bacteria, culture collected. Has received 500 cc of IVF and started on broad anti-biotics.  Given recent CVA, aspiration a possibility and ceftriaxone may not have provided for coverage as outpatient. --Monitor vital signs, fever curve --Cont Vanc/Zosyn given prior tx with Ceftriaxone as outpt w/o improvement --Additional 1 L IVF --F/u blood and urine cultures   Troponinemia Elevated troponin on admission, no prior  evaluation of coronary arteries available on records. Troponin previously normal on prior admissions, pt unable to voice complaints to eval for chest pain. EKG with T wave inversions in V5, V6 which appear new. Is currently on Plavix/asa for CVA history. Discussed case with cards who recommended heparin gtt.  --Trend troponin --Heparin gtt --Repeat EKG      H/o CVA Pt has a history of multiple CVAs, last in Feb 2019 in the setting of sepsis and has had subsequent decline in function since that time and currently residing in SNF, residual L sided deficits. Repeat head CT without acute changes or hemorrhage, difficult exam due to mental status.  --Hold plavix given heparin gtt    H/o HTN Documented history of HTN, though outpatient medication list currently with fludrocortisone for orthostasis noted. Currently normotensive in the setting of infection, fluids as above  H/o DM On home metformin, SSI novolog. Most recent A1c 7.7, bg on admission elevated to 367, 5 U novolog ordered in ED.   --Novolog 4 U + SSI TID ac, plus nighttime SSI    Dispo: Admit patient to Inpatient with  expected length of stay greater than 2 midnights.  Signed: Ginger Carne, MD 11/18/2017, 6:02 PM  Pager: [phone]

## 2017-11-18 NOTE — ED Provider Notes (Signed)
[facility] EMERGENCY DEPARTMENT Provider Note   CSN: [id number] Arrival date & time: 11/18/17  1451     History   Chief Complaint Chief Complaint  Patient presents with  . Altered Mental Status    HPI [name] Williams is a 62 y.o. male.  HPI   62 year old male with hx of CVA, DM, CHF, Schizophrenia brought here via EMS from PCP office for further evaluation of potential sepsis.  History obtained through sister who is at bedside.  Patient with significant past medical history who had most recent stroke approximately a month ago that severely debilitated.  Since then, he has been less active, mostly bedbound, and currently staying at [facility] nursing facility.  He is also participate with [facility] twice weekly.  Today on his regular visit, the staff noticed that his vital signs is abnormal and sent him here for further care.  Sister mention patient has been deteriorating for the past month.  For the past 2-week he has decrease in appetite, decreased speech, eating puree food on occasion, and complaining of extreme thirst.  She also noticed that his legs/feets are always swollen. He also has bed sore, which are being cared for by the nursing staff. He is a full code.  Hx is limited as pt does not appropriately respond to verbal command.   Sister did notice that patient had occasional cough within the past week.    Past Medical History:  Diagnosis Date  . Acute pyelonephritis   . Altered mental status, unspecified 10/04/2017  . 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 Bacteremia   . 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 Cerebral thrombosis with cerebral infarction 10/08/2017  . Chronic diastolic heart failure (HCC) 11/09/2017  . Chronic systolic congestive heart failure (HCC)   . CVA (cerebral infarction) 06/26/2014  . Diabetes mellitus   . Diabetes mellitus (HCC)   . Diplopia   . Dizziness   . DYSLIPIDEMIA 04/08/2008   Qualifier: Diagnosis of  By: Sondra Barges MD, Zac    .  Elevated troponin   . Fever   . History of stroke   . HLD (hyperlipidemia) 04/08/2008   Centricity Description: DIABETES-TYPE 2 Qualifier: Diagnosis of  By: Sondra Barges MD, Zac   Centricity Description: DIABETES MELLITUS, TYPE II Qualifier: Diagnosis of  By: Sondra Barges MD, Zac    . Hypertension   . Hypertensive heart disease with heart failure (HCC)   . 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 Intracranial vascular stenosis 06/23/2014  . Leukocytosis   . Preventative health care 08/29/2013  . Schizophrenia (HCC) 10/31/2014  . Sensory deficit, left 06/26/2014  . Sepsis (HCC)   . Stroke (HCC)   . Stroke-like symptom 06/23/2014  . TOBACCO ABUSE 04/08/2008   Qualifier: Diagnosis of  By: Sondra Barges MD, Zac      Patient Active Problem List   Diagnosis Date Noted  . Chronic diastolic heart failure (HCC) 11/09/2017  . Bacteremia   . Cerebral thrombosis with cerebral infarction 10/08/2017  . Diabetes mellitus (HCC)   . Acute pyelonephritis   . Dizziness   . Hypertensive heart disease with heart failure (HCC)   . Elevated troponin   . Sepsis (HCC)   . Altered mental status, unspecified 10/04/2017  . Fever   . Leukocytosis   . Schizophrenia (HCC) 10/31/2014  . 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 CVA (cerebral infarction) 06/26/2014  . 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 Sensory deficit, left 06/26/2014  . 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 Diplopia   .  Intracranial vascular stenosis 06/23/2014  . Stroke-like symptom 06/23/2014  . Preventative health care 08/29/2013  . History of stroke 12/01/2009  . HLD (hyperlipidemia) 04/08/2008  . DYSLIPIDEMIA 04/08/2008  . TOBACCO ABUSE 04/08/2008    Past Surgical History:  Procedure Laterality Date  . TEE WITHOUT CARDIOVERSION N/A 10/10/2017   Procedure: TRANSESOPHAGEAL ECHOCARDIOGRAM (TEE);  Surgeon: Lewayne Buntingrenshaw, Brian S, MD;  [facility];  Service: Cardiovascular;  Laterality: N/A;        Home Medications    Prior to  Admission medications   Medication Sig Start Date End Date Taking? Authorizing Provider  amLODipine (NORVASC) 5 MG tablet Take 5 mg by mouth daily.    [provider]  aspirin EC 325 MG EC tablet Take 1 tablet (325 mg total) by mouth daily. 10/11/17   Mayo, Allyn KennerKaty Dodd, MD  atorvastatin (LIPITOR) 80 MG tablet Take 1 tablet (80 mg total) by mouth daily at 6 PM. 10/10/17   Mayo, Allyn KennerKaty Dodd, MD  cholecalciferol (VITAMIN D) 1000 units tablet Take 1,000 Units by mouth daily.    [provider]  clopidogrel (PLAVIX) 75 MG tablet Take 1 tablet (75 mg total) by mouth daily. 07/05/14   Angiulli, Mcarthur Rossettianiel J, PA-C  donepezil (ARICEPT) 10 MG tablet Take 10 mg by mouth at bedtime.    [provider]  lisinopril (PRINIVIL,ZESTRIL) 20 MG tablet Take 1 tablet (20 mg total) by mouth daily. 10/11/17   Mayo, Allyn KennerKaty Dodd, MD  metFORMIN (GLUCOPHAGE) 1000 MG tablet TAKE 0.5 TABLETS (500 MG TOTAL) BY MOUTH 2 (TWO) TIMES DAILY WITH A MEAL. Patient taking differently: Take 500 mg by mouth 2 (two) times daily with a meal.  07/05/14   Angiulli, Mcarthur Rossettianiel J, PA-C  paliperidone (INVEGA SUSTENNA) 156 MG/ML SUSP injection Inject 156 mg into the muscle every 28 (twenty-eight) days.    [provider]  polyethylene glycol (MIRALAX / GLYCOLAX) packet Take 17 g by mouth daily. 10/11/17   Mayo, Allyn KennerKaty Dodd, MD    Family History Family History  Problem Relation Age of Onset  . Hypertension Mother   . Heart attack Mother        deceased from MI in 4630s  . Hypertension Father   . Coronary artery disease Maternal Aunt   . Diabetes Maternal Aunt   . Coronary artery disease Maternal Uncle   . Diabetes Maternal Uncle   . Diabetes Paternal Aunt   . Coronary artery disease Paternal Aunt   . Diabetes Paternal Uncle   . Coronary artery disease Paternal Uncle   . Heart attack Sister        deceased from MI in 1140s    Social History Social History   Tobacco Use  . Smoking status: Current Every Day Smoker     Packs/day: 1.00    Years: 10.00    Pack years: 10.00    Types: Cigarettes  . Smokeless tobacco: Never Used  Substance Use Topics  . Alcohol use: No  . Drug use: Not Currently    Comment: marijuana occassionally      Allergies   Atorvastatin   Review of Systems Review of Systems  Unable to perform ROS: Mental status change     Physical Exam Updated Vital Signs There were no vitals taken for this visit.  Physical Exam  Constitutional: No distress.  Elderly cachectic male appears older than stated age.  HENT:  Head: Atraumatic.  Edentulous  Eyes: Pupils are equal, round, and reactive to light. Conjunctivae and EOM are normal.  Neck: Neck supple.  No nuchal rigidity  Cardiovascular:  Tachycardia without murmur rubs or gallops  Pulmonary/Chest:  Decreased breath sounds without wheezes, rales, or rhonchi  Abdominal: Soft. Bowel sounds are normal. He exhibits no distension. There is no tenderness.  Genitourinary:  Genitourinary Comments: Unstageable sacral decubitus ulcer with dressing in place without surrounding skin erythema and no strong odor or discharge  Musculoskeletal:  Patient laying in a slight contracture state  Neurological: He is alert.  Response to pain and grunt when asked question but no true verbal response  Skin: No rash noted.  Psychiatric: He has a normal mood and affect.  Nursing note and vitals reviewed.    ED Treatments / Results  Labs (all labs ordered are listed, but only abnormal results are displayed) Labs Reviewed  COMPREHENSIVE METABOLIC PANEL - Abnormal; Notable for the following components:      Result Value   Sodium 146 (*)    Glucose, Bld 367 (*)    BUN 28 (*)    Total Protein 6.1 (*)    Albumin 1.9 (*)    All other components within normal limits  CBC WITH DIFFERENTIAL/PLATELET - Abnormal; Notable for the following components:   WBC 19.4 (*)    RBC 3.65 (*)    Hemoglobin 9.8 (*)    HCT 31.4 (*)    Neutro Abs 16.0 (*)     Monocytes Absolute 1.8 (*)    All other components within normal limits  CBG MONITORING, ED - Abnormal; Notable for the following components:   Glucose-Capillary 351 (*)    All other components within normal limits  I-STAT TROPONIN, ED - Abnormal; Notable for the following components:   Troponin i, poc 2.62 (*)    All other components within normal limits  CULTURE, BLOOD (ROUTINE X 2)  CULTURE, BLOOD (ROUTINE X 2)  URINE CULTURE  URINALYSIS, ROUTINE W REFLEX MICROSCOPIC  I-STAT CG4 LACTIC ACID, ED  I-STAT CG4 LACTIC ACID, ED    EKG EKG Interpretation  Date/Time:  Friday November 18 2017 15:00:42 EDT Ventricular Rate:  117 PR Interval:    QRS Duration: 79 QT Interval:  300 QTC Calculation: 419 R Axis:   6 Text Interpretation:  Sinus tachycardia Anterior infarct, old rate is faster compared to Feb 2019 Confirmed by Pricilla Loveless [id number]) on 11/18/2017 3:36:36 PM   Radiology Ct Head Wo Contrast  Result Date: 11/18/2017 CLINICAL DATA:  AMSHx of Schizophrenia Pt poor historian not verbal during CT EXAM: CT HEAD WITHOUT CONTRAST TECHNIQUE: Contiguous axial images were obtained from the base of the skull through the vertex without intravenous contrast. COMPARISON:  10/08/2017 FINDINGS: Brain: No evidence of acute infarction, hemorrhage, hydrocephalus, extra-axial collection or mass lesion/mass effect. There is ventricular and sulcal enlargement reflecting moderate atrophy, advanced for age. There are multiple small old lacune infarcts. Old right cerebellar infarct. Patchy white matter hypoattenuation consistent with chronic microvascular ischemic change. Vascular: No hyperdense vessel or unexpected calcification. Skull: Normal. Negative for fracture or focal lesion. Sinuses/Orbits: Globes and orbits are unremarkable. Mild to moderate left and mild right polypoid mucosal thickening. Remaining sinuses are clear. Other: None. IMPRESSION: 1. No acute intracranial abnormalities. 2. Atrophy, old  infarcts and chronic microvascular ischemic change, unchanged from the prior head CT. Electronically Signed   By: Amie Portland M.D.   On: 11/18/2017 17:06   Dg Chest Port 1 View  Result Date: 11/18/2017 CLINICAL DATA:  Sepsis EXAM: PORTABLE CHEST 1 VIEW COMPARISON:  10/04/2017 FINDINGS: 1519 hours. Left base atelectasis  or infiltrate. No edema, pneumothorax or pleural effusion. The cardiopericardial silhouette is within normal limits for size. The visualized bony structures of the thorax are intact. Telemetry leads overlie the chest. IMPRESSION: Left base atelectasis or pneumonia. Electronically Signed   By: Kennith Center M.D.   On: 11/18/2017 16:01    Procedures Procedures (including critical care time)  Medications Ordered in ED Medications  piperacillin-tazobactam (ZOSYN) IVPB 3.375 g (has no administration in time range)  vancomycin (VANCOCIN) IVPB 750 mg/150 ml premix (has no administration in time range)  sodium chloride 0.9 % bolus 1,000 mL (1,000 mLs Intravenous New Bag/Given 11/18/17 1551)  piperacillin-tazobactam (ZOSYN) IVPB 3.375 g (0 g Intravenous Stopped 11/18/17 1639)  acetaminophen (TYLENOL) tablet 650 mg (650 mg Oral Given 11/18/17 1551)  vancomycin (VANCOCIN) 1,250 mg in sodium chloride 0.9 % 250 mL IVPB (0 mg Intravenous Stopped 11/18/17 1830)     Initial Impression / Assessment and Plan / ED Course  I have reviewed the triage vital signs and the nursing notes.  Pertinent labs & imaging results that were available during my care of the patient were reviewed by me and considered in my medical decision making (see chart for details).     BP 121/89   Pulse (!) 112   Temp (!) 101 F (38.3 C) (Rectal)   Resp 15   Ht 6' (1.829 m)   Wt 68 kg (150 lb)   SpO2 98%   BMI 20.34 kg/m    Final Clinical Impressions(s) / ED Diagnoses   Final diagnoses:  Community acquired pneumonia, unspecified laterality  Sepsis, due to unspecified organism (HCC)  Elevated troponin I level     ED Discharge Orders    None     3:56 PM Patient here for altered mental status.  Has a fever of 101 and is tachycardic with a heart rate of 115.  Sister report occasional cough.  He also has a sacral decubitus ulcer.  Code sepsis initiated.  Patient given fluid resuscitation at 30 mL/kg as well as broad-spectrum antibiotic.  Lactic acid is 1.83, therefore I will reduce the amount of fluid resuscitation as there are no evidence of endorgan damage.  Does have a white count of 19.4 and he is also found to be hyperglycemic with a CBG of 351.  6:35 PM Elevated trop of 2.62, no EKG changes, will need to trend trop and repeat EKG.  CXR showing L base pneumonia.  Pt have been coughing.  Head CT without new changes.  Appreciate consultation from Internal Medicine resident who agrees to see and admit pt for further care.  Care discussed with Dr. Criss Alvine.   CRITICAL CARE Performed by: Fayrene Helper Total critical care time: 42 minutes Critical care time was exclusive of separately billable procedures and treating other patients. Critical care was necessary to treat or prevent imminent or life-threatening deterioration. Critical care was time spent personally by me on the following activities: development of treatment plan with patient and/or surrogate as well as nursing, discussions with consultants, evaluation of patient's response to treatment, examination of patient, obtaining history from patient or surrogate, ordering and performing treatments and interventions, ordering and review of laboratory studies, ordering and review of radiographic studies, pulse oximetry and re-evaluation of patient's condition.    Fayrene Helper, PA-C 11/18/17 7829    Pricilla Loveless, MD 11/19/17 [phone]

## 2017-11-19 ENCOUNTER — Inpatient Hospital Stay (HOSPITAL_COMMUNITY): Payer: Medicare (Managed Care)

## 2017-11-19 DIAGNOSIS — I10 Essential (primary) hypertension: Secondary | ICD-10-CM

## 2017-11-19 DIAGNOSIS — Z7902 Long term (current) use of antithrombotics/antiplatelets: Secondary | ICD-10-CM

## 2017-11-19 DIAGNOSIS — J189 Pneumonia, unspecified organism: Secondary | ICD-10-CM

## 2017-11-19 DIAGNOSIS — I214 Non-ST elevation (NSTEMI) myocardial infarction: Secondary | ICD-10-CM

## 2017-11-19 DIAGNOSIS — R7989 Other specified abnormal findings of blood chemistry: Secondary | ICD-10-CM

## 2017-11-19 DIAGNOSIS — E119 Type 2 diabetes mellitus without complications: Secondary | ICD-10-CM

## 2017-11-19 DIAGNOSIS — Z7982 Long term (current) use of aspirin: Secondary | ICD-10-CM

## 2017-11-19 DIAGNOSIS — I69354 Hemiplegia and hemiparesis following cerebral infarction affecting left non-dominant side: Secondary | ICD-10-CM

## 2017-11-19 DIAGNOSIS — Z7984 Long term (current) use of oral hypoglycemic drugs: Secondary | ICD-10-CM

## 2017-11-19 DIAGNOSIS — E785 Hyperlipidemia, unspecified: Secondary | ICD-10-CM

## 2017-11-19 DIAGNOSIS — F209 Schizophrenia, unspecified: Secondary | ICD-10-CM

## 2017-11-19 DIAGNOSIS — N390 Urinary tract infection, site not specified: Secondary | ICD-10-CM

## 2017-11-19 DIAGNOSIS — R531 Weakness: Secondary | ICD-10-CM

## 2017-11-19 LAB — TROPONIN I
Troponin I: 2.68 ng/mL (ref <0.03)
Troponin I: 2.36 ng/mL (ref <0.03)

## 2017-11-19 LAB — URINE CULTURE: Culture: NO GROWTH

## 2017-11-19 LAB — CBC
HEMATOCRIT: 29.8 % — AB (ref 39.0–52.0)
HEMOGLOBIN: 9.4 g/dL — AB (ref 13.0–17.0)
MCH: 27.5 pg (ref 26.0–34.0)
MCHC: 31.5 g/dL (ref 30.0–36.0)
MCV: 87.1 fL (ref 78.0–100.0)
Platelets: 356 10*3/uL (ref 150–400)
RBC: 3.42 MIL/uL — AB (ref 4.22–5.81)
RDW: 14.8 % (ref 11.5–15.5)
WBC: 18.6 10*3/uL — AB (ref 4.0–10.5)

## 2017-11-19 LAB — ECHOCARDIOGRAM COMPLETE
HEIGHTINCHES: 72 in
WEIGHTICAEL: 2335.11 [oz_av]

## 2017-11-19 LAB — T4, FREE: Free T4: 1.25 ng/dL — ABNORMAL HIGH (ref 0.61–1.12)

## 2017-11-19 LAB — GLUCOSE, CAPILLARY
GLUCOSE-CAPILLARY: 293 mg/dL — AB (ref 65–99)
GLUCOSE-CAPILLARY: 318 mg/dL — AB (ref 65–99)
GLUCOSE-CAPILLARY: 190 mg/dL — AB (ref 65–99)
GLUCOSE-CAPILLARY: 209 mg/dL — AB (ref 65–99)

## 2017-11-19 LAB — BASIC METABOLIC PANEL
Anion gap: 11 (ref 5–15)
BUN: 22 mg/dL — ABNORMAL HIGH (ref 6–20)
CO2: 23 mmol/L (ref 22–32)
Calcium: 8.4 mg/dL — ABNORMAL LOW (ref 8.9–10.3)
Chloride: 115 mmol/L — ABNORMAL HIGH (ref 101–111)
Creatinine, Ser: 0.88 mg/dL (ref 0.61–1.24)
GFR calc non Af Amer: >60 mL/min (ref >60)
Glucose, Bld: 332 mg/dL — ABNORMAL HIGH (ref 65–99)
POTASSIUM: 3.6 mmol/L (ref 3.5–5.1)
SODIUM: 149 mmol/L — AB (ref 135–145)

## 2017-11-19 LAB — TSH: TSH: 0.31 u[IU]/mL — ABNORMAL LOW (ref 0.350–4.500)

## 2017-11-19 LAB — HEPARIN LEVEL (UNFRACTIONATED)
Heparin Unfractionated: <0.1 IU/mL — ABNORMAL LOW (ref 0.30–0.70)
Heparin Unfractionated: <0.1 IU/mL — ABNORMAL LOW (ref 0.30–0.70)

## 2017-11-19 MED ORDER — INSULIN GLARGINE 100 UNIT/ML ~~LOC~~ SOLN: 5.0000 [IU] | Freq: Every day | SUBCUTANEOUS | Status: DC

## 2017-11-19 MED ORDER — INSULIN GLARGINE 100 UNIT/ML ~~LOC~~ SOLN
5.0000 [IU] | Freq: Every day | SUBCUTANEOUS | Status: DC
  Administered 2017-11-19: 5 [IU] via SUBCUTANEOUS
  Filled 2017-11-19: qty 0.05

## 2017-11-19 MED ORDER — SODIUM CHLORIDE 0.9 % IV SOLN
INTRAVENOUS | Status: DC
  Administered 2017-11-19: 75 mL/h via INTRAVENOUS
  Administered 2017-11-20: 03:00:00 via INTRAVENOUS

## 2017-11-19 MED ORDER — INSULIN ASPART 100 UNIT/ML ~~LOC~~ SOLN: 5.0000 [IU] | Freq: Once | SUBCUTANEOUS | Status: DC

## 2017-11-19 MED ORDER — HEPARIN BOLUS VIA INFUSION
2000.0000 [IU] | Freq: Once | INTRAVENOUS | Status: AC
  Administered 2017-11-19: 2000 [IU] via INTRAVENOUS
  Filled 2017-11-19: qty 2000

## 2017-11-19 MED ORDER — INSULIN ASPART 100 UNIT/ML ~~LOC~~ SOLN
3.0000 [IU] | Freq: Three times a day (TID) | SUBCUTANEOUS | Status: DC
  Administered 2017-11-20 – 2017-11-22 (×6): 3 [IU] via SUBCUTANEOUS

## 2017-11-19 NOTE — Evaluation (Signed)
Clinical/Bedside Swallow Evaluation Patient Details  Name: [name] Williams MRN: [id number] Date of Birth: 02/22/1956  Today'Williams Date: 11/19/2017 Time: SLP Start Time (ACUTE ONLY): 1400 SLP Stop Time (ACUTE ONLY): 1410 SLP Time Calculation (min) (ACUTE ONLY): 10 min  Past Medical History:  Past Medical History:  Diagnosis Date  . Acute pyelonephritis   . Altered mental status, unspecified 10/04/2017  . 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 Bacteremia   . 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 Cerebral thrombosis with cerebral infarction 10/08/2017  . Chronic diastolic heart failure (HCC) 11/09/2017  . Chronic systolic congestive heart failure (HCC)   . CVA (cerebral infarction) 06/26/2014  . Diabetes mellitus   . Diabetes mellitus (HCC)   . Diplopia   . Dizziness   . DYSLIPIDEMIA 04/08/2008   Qualifier: Diagnosis of  By: Sondra BargesForsey MD, Zac    . Elevated troponin   . Fever   . History of stroke   . HLD (hyperlipidemia) 04/08/2008   Centricity Description: DIABETES-TYPE 2 Qualifier: Diagnosis of  By: Sondra BargesForsey MD, Zac   Centricity Description: DIABETES MELLITUS, TYPE II Qualifier: Diagnosis of  By: Sondra BargesForsey MD, Zac    . Hypertension   . Hypertensive heart disease with heart failure (HCC)   . 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 Intracranial vascular stenosis 06/23/2014  . Leukocytosis   . Preventative health care 08/29/2013  . Schizophrenia (HCC) 10/31/2014  . Sensory deficit, left 06/26/2014  . Sepsis (HCC)   . Stroke (HCC)   . Stroke-like symptom 06/23/2014  . TOBACCO ABUSE 04/08/2008   Qualifier: Diagnosis of  By: Sondra BargesForsey MD, Zac     Past Surgical History:  Past Surgical History:  Procedure Laterality Date  . TEE WITHOUT CARDIOVERSION N/A 10/10/2017   Procedure: TRANSESOPHAGEAL ECHOCARDIOGRAM (TEE);  Surgeon: [name] Buntingrenshaw, Brian S, MD;  Location: [facility];  Service: Cardiovascular;  Laterality: N/A;   HPI:  Patient is 62 year old male with past medical history of CVA, hypertension,  hyperlipidemia, diabetes, schizophrenia who presented to the ED from SNF with worsening altered mental status and fever. CXR shows left lower lobe haziness consistent with atelectasis or infiltrate. Admitted in February 2019 for similar presentation of altered mental status and fever and was found to be bacteremic from urinary source with Klebsiella. That admission was complicated with an acute CVA. Patient has been in SNF since discharge with progressive decline. Pt evaluated by ST 09/17/17 with recommendation for regular diet, thin liquids, no further follow-up. Pt had prior brainstem and cerebellar CVA (2011, 2015) with no reported swallowing deficits.   Assessment / Plan / Recommendation Clinical Impression   Patient presents with lethargy, altered mental status impacting safety with POs. Swallowing was assessed to be within functional limits during recent admission, however pt this date is drowsy and presents with general oral weakeness. He makes efforts to follow commands for oral motor examination, however oral movements are minimal and labial seal decreased bilaterally. There is oral holding with ice chips and cup sips of thin liquids; suspect delayed swallow initiation. With straw, pt retrieves bolus and with appearance of more timely swallow initiation and no overt signs of aspiration, even with consecutive swallows in excess of 3 oz. With teaspoon puree, pt held orally without efforts to manipulate despite max cues. SLP presented with straw for liquid wash, however pt could not obtain adequate closure or negative pressure due to change in alertness, and oral cavity was suctioned. Recommend clear liquids with full supervision, only when fully alert, straws OK. Crush meds in puree, give  only when pt fully alert and check oral cavity for holding. Will follow up next date. D/w RN.   SLP Visit Diagnosis: Dysphagia, unspecified (R13.10)    Aspiration Risk  Moderate aspiration risk    Diet  Recommendation Thin liquid   Liquid Administration via: Straw Medication Administration: Crushed with puree Supervision: Full supervision/cueing for compensatory strategies Compensations: Minimize environmental distractions;Slow rate;Small sips/bites;Other (Comment)(Feed only when alert)    Other  Recommendations Oral Care Recommendations: Oral care QID Other Recommendations: Have oral suction available   Follow up Recommendations Skilled Nursing facility      Frequency and Duration min 2x/week  2 weeks       Prognosis Prognosis for Safe Diet Advancement: Good Barriers to Reach Goals: Cognitive deficits      Swallow Study   General Date of Onset: 11/18/17 HPI: Patient is 62 year old male with past medical history of CVA, hypertension, hyperlipidemia, diabetes, schizophrenia who presented to the ED from SNF with worsening altered mental status and fever. CXR shows left lower lobe haziness consistent with atelectasis or infiltrate. Admitted in February 2019 for similar presentation of altered mental status and fever and was found to be bacteremic from urinary source with Klebsiella. That admission was complicated with an acute CVA. Patient has been in SNF since discharge with progressive decline. Pt evaluated by ST 09/17/17 with recommendation for regular diet, thin liquids, no further follow-up. Pt had prior brainstem and cerebellar CVA (2011, 2015) with no reported swallowing deficits. Type of Study: Bedside Swallow Evaluation Previous Swallow Assessment: see HPI Diet Prior to this Study: Dysphagia 3 (soft);Thin liquids Temperature Spikes Noted: Yes(101) Respiratory Status: Room air History of Recent Intubation: No Behavior/Cognition: Lethargic/Drowsy;Doesn't follow directions;Distractible Oral Cavity Assessment: Excessive secretions Oral Care Completed by SLP: No Oral Cavity - Dentition: Poor condition;Missing dentition Vision: Impaired for self-feeding Self-Feeding Abilities:  Total assist Patient Positioning: Upright in bed Baseline Vocal Quality: Low vocal intensity Volitional Cough: Cognitively unable to elicit Volitional Swallow: Unable to elicit    Oral/Motor/Sensory Function Overall Oral Motor/Sensory Function: Generalized oral weakness   Ice Chips Ice chips: Impaired Presentation: Spoon Oral Phase Impairments: Reduced labial seal;Reduced lingual movement/coordination;Poor awareness of bolus Oral Phase Functional Implications: Oral holding;Prolonged oral transit Pharyngeal Phase Impairments: Suspected delayed Swallow   Thin Liquid Thin Liquid: Impaired Presentation: Cup;Straw Oral Phase Impairments: Reduced labial seal;Poor awareness of bolus Oral Phase Functional Implications: Oral holding    Nectar Thick Nectar Thick Liquid: Not tested   Honey Thick Honey Thick Liquid: Not tested   Puree Puree: Impaired Presentation: Spoon Oral Phase Impairments: Poor awareness of bolus;Impaired mastication;Reduced lingual movement/coordination;Reduced labial seal Oral Phase Functional Implications: Oral holding;Other (comment)(removed with suction)   Solid   GO   Solid: Not tested        Arlana Lindau 11/19/2017,2:26 PM Rondel Baton, MS, CCC-SLP Speech-Language Pathologist [phone]

## 2017-11-19 NOTE — Progress Notes (Signed)
1712-  Patient transported to MRI. Ok with Caryn BeeKevin of Pharmacy to stop Heparin drip  While patient is having  MRI.

## 2017-11-19 NOTE — Consult Note (Addendum)
NEUROHOSPITALISTS - CONSULTATION NOTE   Requesting Physician: Dr. Earl Lagos [facility]: Dr. Ritta Slot  Admit date: 11/18/2017    Chief Complaint: Neurological decline and increased spastic since 09/2017 CVA  History obtained from:  Patient's sister and Chart     HPI   [name] Williams is an 62 y.o. male with a PMH of Schizophrenia, HTN, HLD, DM, CHF and Hx of CVA in 09/2017 with Left sided deficits and reported increasing AMS and spasticity, admitted from PCP office for concerns of sepsis. Neurology has been consulted for worsening AMS in the setting of sepsis and worsening spasticity since 09/2017 CVA.  11/08/2017: [facility] Follow up appointment Patients sister report generalized weakness with worsening of right and left-sided weakness over the past 2-3 years. Per sister, since his most previous stroke 09/2017, the patient has had increased fatigue, dysarthria and increased confusion. Patients PCP/Dr. Dorothe Pea is concerned about the increase in LE contractures and now pressure ulcers.  An additional concern is that recently when he was working with physical therapy he had these "spells" where he would stiffen up, his back with arch and he would appear as he was going to pass out.  His heart rate would fluctuate from bradycardia tachycardia.  30-day monitor was placed on 10/24/2017 to rule out arrhythmia.   Modified Rankin: 5  Past Medical History    Past Medical History:  Diagnosis Date  . Acute pyelonephritis   . Altered mental status, unspecified 10/04/2017  . 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 Bacteremia   . 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 Cerebral thrombosis with cerebral infarction 10/08/2017  . Chronic diastolic heart failure (HCC) 11/09/2017  . Chronic systolic congestive heart failure (HCC)   . CVA (cerebral infarction) 06/26/2014  . Diabetes mellitus   . Diabetes mellitus (HCC)    . Diplopia   . Dizziness   . DYSLIPIDEMIA 04/08/2008   Qualifier: Diagnosis of  By: Sondra Barges MD, Zac    . Elevated troponin   . Fever   . History of stroke   . HLD (hyperlipidemia) 04/08/2008   Centricity Description: DIABETES-TYPE 2 Qualifier: Diagnosis of  By: Sondra Barges MD, Zac   Centricity Description: DIABETES MELLITUS, TYPE II Qualifier: Diagnosis of  By: Sondra Barges MD, Zac    . Hypertension   . Hypertensive heart disease with heart failure (HCC)   . 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 Intracranial vascular stenosis 06/23/2014  . Leukocytosis   . Preventative health care 08/29/2013  . Schizophrenia (HCC) 10/31/2014  . Sensory deficit, left 06/26/2014  . Sepsis (HCC)   . Stroke (HCC)   . Stroke-like symptom 06/23/2014  . TOBACCO ABUSE 04/08/2008   Qualifier: Diagnosis of  By: Sondra Barges MD, Zac     Past Surgical History   Past Surgical History:  Procedure Laterality Date  . TEE WITHOUT CARDIOVERSION N/A 10/10/2017   Procedure: TRANSESOPHAGEAL ECHOCARDIOGRAM (TEE);  Surgeon: Lewayne Bunting, MD;  Location: [facility] ENDOSCOPY;  Service: Cardiovascular;  Laterality: N/A;   Family History   Family History  Problem Relation Age of Onset  . Hypertension Mother   . Heart attack Mother        deceased from MI in 60s  . Hypertension Father   . Coronary artery disease Maternal Aunt   . Diabetes Maternal Aunt   . Coronary artery disease Maternal Uncle   . Diabetes Maternal Uncle   . Diabetes Paternal Aunt   . Coronary artery disease Paternal Aunt   . Diabetes Paternal Uncle   .  Coronary artery disease Paternal Uncle   . Heart attack Sister        deceased from MI in 29s   Social History   reports that he has been smoking cigarettes.  He has a 10.00 pack-year smoking history. He has never used smokeless tobacco. He reports that he has current or past drug history. He reports that he does not drink alcohol.  Allergies   Allergies  Allergen Reactions  . Atorvastatin     "leg  aches"   Medications Prior to Admission   No current facility-administered medications on file prior to encounter.    Current Outpatient Medications on File Prior to Encounter  Medication Sig Dispense Refill  . acetaminophen (TYLENOL) 325 MG tablet Take 650 mg by mouth every 4 (four) hours as needed for mild pain.    Marland Kitchen amLODipine (NORVASC) 5 MG tablet Take 5 mg by mouth daily.    Marland Kitchen aspirin EC 325 MG EC tablet Take 1 tablet (325 mg total) by mouth daily. 30 tablet 0  . atorvastatin (LIPITOR) 80 MG tablet Take 1 tablet (80 mg total) by mouth daily at 6 PM. 30 tablet 0  . cholecalciferol (VITAMIN D) 1000 units tablet Take 1,000 Units by mouth daily.    . clopidogrel (PLAVIX) 75 MG tablet Take 1 tablet (75 mg total) by mouth daily. 30 tablet 3  . donepezil (ARICEPT) 10 MG tablet Take 10 mg by mouth at bedtime.    Marland Kitchen lisinopril (PRINIVIL,ZESTRIL) 20 MG tablet Take 1 tablet (20 mg total) by mouth daily. 30 tablet 0  . metFORMIN (GLUCOPHAGE) 1000 MG tablet TAKE 0.5 TABLETS (500 MG TOTAL) BY MOUTH 2 (TWO) TIMES DAILY WITH A MEAL. (Patient taking differently: Take 500 mg by mouth 2 (two) times daily with a meal. ) 60 tablet 3  . paliperidone (INVEGA SUSTENNA) 156 MG/ML SUSP injection Inject 156 mg into the muscle every 28 (twenty-eight) days.    . polyethylene glycol (MIRALAX / GLYCOLAX) packet Take 17 g by mouth daily. 14 each 0   ROS  History obtained from chart review On admission patient sister reported:past 2-week he has decrease in appetite, decreased speech, eating puree food on occasion, and complaining of extreme thirst.  Swollen legs/feets, bed sore and occasional cough within the past week.  Physical Examination   Vitals:   11/18/17 2130 11/18/17 2157 11/19/17 0323 11/19/17 1222  BP: 123/89 125/88 (!) 122/91 118/80  Pulse: (!) 106 (!) 113 (!) 103 (!) 101  Resp: 13 18 18 18   Temp:  (!) 97.2 F (36.2 C) 99.6 F (37.6 C) 99.6 F (37.6 C)  TempSrc:  Oral Oral Oral  SpO2: 98% 100% 95%  99%  Weight:  66.8 kg (147 lb 4.3 oz) 66.2 kg (145 lb 15.1 oz)   Height:       HEENT-  Normocephalic,  Cardiovascular - Regular rate and rhythm  Respiratory - Non-labored breathing, No wheezing. Abdomen - soft and non-tender, BS normal Extremities-  Evidence of muscle wasting; unable to passively straighten legs due to pain Skin-multiple decubitus ulcers  Neurological Examination  Mental Status: Will briefly arouse to voice but needs constant verbal and physical stimulation to remain awake, Not oriented to self or DOB, Minimal speech output. 1-2 words, mostly understandable. Makes no attempt to communicate verbally or with hand gestures. No obvious aphasia, dysarthria noted but 1-2 words spoken were understandable.  Able to follow very simple commands with delay, not consistently Cranial Nerves: II: Visual Fields appear full. +blink to  threat bilaterally. Pupils are equal, round, and reactive to light.   III,IV, VI: EOMI without ptosis, No obvious nystagmus noted.  +tracking examiner V: Patient does not cooperate with testing, does not answer questions asked VII: Facial movement appears symmetric.  VIII: hearing appears intact to voice, open eyes when name called X: Patient does not cooperate with testing, does not respond when questions asked XI:  Patient does not cooperate with testing, does not respond when questions asked XII: tongue appears midline without atrophy or fasciculations.  Motor: Tone is increased throughout, Left worse than Right and LE's worse than UE's. +signs of muscle wasting. 0/5 strength throughout. Was only able to squeeze hands bilaterally. No other spontaneous movement on strength testing. Left Grip 2/5, Right Grip 3/5. Bilateral wrist drop noted. LE's contracted and patient complains of pain when legs moved or touched.   Sensor: Patient does not cooperate with testing, does not respond when questions asked. withdrawals to pain in LE's Deep Tendon Reflexes: 1+ and  symmetric throughout in the biceps and no reflexes in patellae Plantars: Toes appear down going but difficult to assess.  Cerebellar: patient unable to perform Gait: not able to be tested  Laboratory Results  CBC: Recent Labs  Lab 11/18/17 1520 11/19/17 0556  WBC 19.4* 18.6*  HGB 9.8* 9.4*  HCT 31.4* 29.8*  MCV 86.0 87.1  PLT 375 356   Basic Metabolic Panel: Recent Labs  Lab 11/18/17 1520 11/19/17 0556  NA 146* 149*  K 4.4 3.6  CL 109 115*  CO2 22 23  GLUCOSE 367* 332*  BUN 28* 22*  CREATININE 1.06 0.88  CALCIUM 8.9 8.4*   Thyroid Function Studies: No results for input(s): TSH, T4TOTAL, T3FREE, THYROIDAB in the last 72 hours. Cardiac Enzymes: Recent Labs  Lab 11/18/17 2007 11/19/17 0041 11/19/17 0556  TROPONINI 2.36* 2.68* 2.36*   Urinalysis:  Recent Labs  Lab 11/18/17 1846  COLORURINE YELLOW  APPEARANCEUR HAZY*  LABSPEC 1.020  PHURINE 5.0  GLUCOSEU >=500*  HGBUR SMALL*  BILIRUBINUR NEGATIVE  KETONESUR 5*  PROTEINUR NEGATIVE  NITRITE NEGATIVE  LEUKOCYTESUR MODERATE*   Urine Drug Screen:      Component Value Date/Time   LABOPIA NONE DETECTED 10/04/2017 0703   COCAINSCRNUR NONE DETECTED 10/04/2017 0703   LABBENZ NONE DETECTED 10/04/2017 0703   AMPHETMU NONE DETECTED 10/04/2017 0703   THCU NONE DETECTED 10/04/2017 0703   LABBARB NONE DETECTED 10/04/2017 0703    Alcohol Level: No results for input(s): ETH in the last 168 hours.  Imaging Results  Ct Head Wo Contrast  Result Date: 11/18/2017 CLINICAL DATA:  AMSHx of Schizophrenia Pt poor historian not verbal during CT EXAM: CT HEAD WITHOUT CONTRAST TECHNIQUE: Contiguous axial images were obtained from the base of the skull through the vertex without intravenous contrast. COMPARISON:  10/08/2017 FINDINGS: Brain: No evidence of acute infarction, hemorrhage, hydrocephalus, extra-axial collection or mass lesion/mass effect. There is ventricular and sulcal enlargement reflecting moderate atrophy, advanced  for age. There are multiple small old lacune infarcts. Old right cerebellar infarct. Patchy white matter hypoattenuation consistent with chronic microvascular ischemic change. Vascular: No hyperdense vessel or unexpected calcification. Skull: Normal. Negative for fracture or focal lesion. Sinuses/Orbits: Globes and orbits are unremarkable. Mild to moderate left and mild right polypoid mucosal thickening. Remaining sinuses are clear. Other: None. IMPRESSION: 1. No acute intracranial abnormalities. 2. Atrophy, old infarcts and chronic microvascular ischemic change, unchanged from the prior head CT. Electronically Signed   By: Amie Portland M.D.   On: 11/18/2017  17:06   Dg Chest Port 1 View  Result Date: 11/18/2017 CLINICAL DATA:  Sepsis EXAM: PORTABLE CHEST 1 VIEW COMPARISON:  10/04/2017 FINDINGS: 1519 hours. Left base atelectasis or infiltrate. No edema, pneumothorax or pleural effusion. The cardiopericardial silhouette is within normal limits for size. The visualized bony structures of the thorax are intact. Telemetry leads overlie the chest. IMPRESSION: Left base atelectasis or pneumonia. Electronically Signed   By: Kennith Center M.D.   On: 11/18/2017 16:01   Transthoracic Echocardiography 11/19/2017 Study Conclusions - Left ventricle: The cavity size was mildly dilated. Wall   thickness was normal. Systolic function was moderately to   severely reduced. The estimated ejection fraction was in the   range of 30% to 35%. Akinesis of the mid-apicalanteroseptal   myocardium.  Transesophageal Echocardiography -  10/10/2017  recommended that patient follow up with 30 day cardiac monitor.  30-day monitor was placed on 10/24/2017 to rule out arrhythmia.   Study Conclusions - Left ventricle: Systolic function was normal. The estimated   ejection fraction was in the range of 50% to 55%. Wall motion was   normal; there were no regional wall motion abnormalities. - Aortic valve: No evidence of  vegetation. - Mitral valve: No evidence of vegetation. - Left atrium: No evidence of thrombus in the atrial cavity or   appendage. - Atrial septum: There was an atrial septal aneurysm. - Tricuspid valve: No evidence of vegetation. - Pulmonic valve: No evidence of vegetation. Impressions: - Normal LV function; atrial septal aneurysm; negative saline   microcavitation study.  IMPRESSION AND PLAN  [name] is a 62 y.o. male with PMH of PMH of Schizophrenia, HTN, HLD, DM, CHF and Hx of CVA in 09/2017 with Left sided deficits and reported increasing AMS and spasticity, admitted for sepsis secondary to UTI and Pneumonia. 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 Anne Hahn  Outstanding Work-up Studies:     MRI of the Brain                                                     PENDING B/L LE Doppler                                                      PENDING EEG                                                                       PENDING TSH and Free T4  PLAN  11/19/2017  Frequent Neurochecks  Telemetry Monitoring NPO until passes Stroke swallow screen Continue Aspirin/ Plavix / Statin - once medically stable MRI Brain B/L LE Dopplers - r/o DVT EEG - r/o Seizure Check TSH and Free T4 Interrogate cardiac event monitor for heart arrthymias PT/OT/SLP Nutrition Consult with calorie count and  feeding assistance Consult Case Management /MSW Ongoing aggressive stroke risk factor management Patient's family will be counseled to be compliant with his antithrombotic medications Patient's family will be counseled on Lifestyle modifications including, Diet, Exercise, and Stress [facility] Dr Anne HahnWillis on April 15 at 11AM,  EMG/NCV scheduled for 11/28/17 with Dr. Anne HahnWillis  NEUROLOGICAL  ISSUES   HX OF STROKES: February 2019, posterior right lentiform nucleus and left corona radiata  Baseline Findings: residual L sided deficits with lower extremity contractures.  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 DAPT therapy since 09/2017  2011: [place] stroke along with bilateral BG infarct, thalamic infarct Old Right cerebellar infarct   MEDICAL ISSUES   R/O AFIB: Interrogate cardiac event monitor for heart arrthymias If A.FIB found on telemetry monitoring, the patient [facility] Decision will be made pending imaging and stroke team decision.  HYPERTENSION: Stable, Avoid Hypotension Long term BP goal normotensive. Home Meds: YES  HYPERLIPIDEMIA:    Component Value Date/Time   CHOL 123 10/07/2017 0715   TRIG 119 10/07/2017 0715   HDL 20 (L) 10/07/2017 0715   CHOLHDL 6.2 10/07/2017 0715   VLDL 24 10/07/2017 0715   LDLCALC 79 10/07/2017 0715  Home Meds:  Lipitor 80 mg LDL  goal < 70 Continued on Lipitor to 80 mg daily Continue statin at discharge  DIABETES: Lab Results  Component Value Date   HGBA1C 7.7 (H) 10/04/2017  HgbA1c goal < 7.0 Currently on: Novolog Continue CBG monitoring and SSI to maintain glucose 140-180 mg/dl DM education   TOBACCO ABUSE Current smoker Smoking cessation counseling provided Nicotine patch provided    Hospital day # 1 VTE prophylaxis: Heparin  Diet - DIET SOFT Room service appropriate? Yes; Fluid consistency: Thin     FAMILY UPDATES: No family at bedside  TEAM UPDATES:Narendra, Nischal, MD STATUS:    Discharge Information  Prior Home Stroke Medications: aspirin 325 mg daily and Eliquis (apixaban) daily  Discharge Stroke Meds:  Please discharge patient on TBD     Disposition:  Therapy Recs:               PENDING  Follow up Appointments  Follow Up:  Annie SableKohler, Patricia Lynn, NP -PCP Follow up in 1-2 weeks     Assessment and plan discussed with with attending physician and they are in agreement.    Beryl MeagerMary A Costello, ANP-C [facility] 11/19/2017, 1:39 PM  11/19/2017 ATTENDING ASSESSMENT   62 year old male with significant weakness and encephalopathy in the setting of sepsis.  It is unclear to me how much of his lack of movement is effort related versus true weakness given he is lethargic when I examined him.  He does move all extremities voluntarily, wiggles toes bilaterally.  He has some spasticity with developing contractures in his legs.  Given that he is not moving any of his extremities very well, and in the cervical spine imaging is prudent.  Repeating his brain imaging would likely be prudent as well given the decline that has been seen as an outpatient.  I suspect that his current acute deconditioning is his sepsis superimposed on his chronic underlying dysfunction.  If imaging is negative, then I am not sure I would have any acute recommendations other than following up for possible Botox injections which may help reduce his baclofen dose as baclofen can cause sedation.  1) MRI brain and cervical spine  Ritta SlotMcNeill Zakariah Dejarnette,  MD [facility] [phone]  If 7pm- 7am, please page neurology on call as listed in [place].

## 2017-11-19 NOTE — Progress Notes (Addendum)
  Date: 11/19/2017  Patient name: [name] Williams Sease  Medical record number: [id number]  Date of birth: 1956-04-23   I have seen and evaluated [name] Williams Ventura and discussed their care with the Residency Team.  In brief, patient is 62 year old male with past medical history of CVA, hypertension, hyperlipidemia, diabetes, schizophrenia who presented to the ED from SNF with worsening altered mental status and fever.  History obtained from chart as patient is unable to provide a history of this time.  Patient was initially admitted  in February 2019 for similar presentation of altered mental status and fever and was found to be bacteremic from urinary source with Klebsiella.  That admission was complicated with an acute CVA.  Patient has been in SNF since discharge with progressive decline.  He had been receiving treatment with ceftriaxone over the last couple of days for suspected urinary tract infection and left-sided pneumonia.  He had progressively worsening mental status and noted to have fevers as an outpatient and was sent to the ED for further evaluation.  Unable to obtain ROS from patient.  Today patient is awake, alert and oriented x2.  He is unable to provide a history or review of systems.  PMHx, Fam Hx, and/or Soc Hx : As per resident admit note  Vitals:   11/19/17 0323 11/19/17 1222  BP: (!) 122/91 118/80  Pulse: (!) 103 (!) 101  Resp: 18 18  Temp: 99.6 F (37.6 C) 99.6 F (37.6 C)  SpO2: 95% 99%   General: Awake alert oriented x2, NAD CVS: Regular rate and rhythm, normal heart sounds Lungs: CTA bilaterally Abdomen: Soft, nontender, nondistended, normoactive bowel sounds Extremities: No edema noted  Assessment and Plan: I have seen and evaluated the patient as outlined above. I agree with the formulated Assessment and Plan as detailed in the residents' note, with the following changes:   1.  Altered mental status: -Patient presented to the ED from SNF with worsening altered mental  status in the setting of fevers and was found to have leukocytosis with WBC elevated to 19.4 and a T-max of 101 F over the last 24 hours.  I suspect that his altered mental status secondary to an underlying infection.  His chest x-ray does have a left lower lobe haziness consistent with either atelectasis or infiltrate.  He also had a UA which was positive with moderate leukocytes and a few bacteria. -We will continue with Zosyn for now and narrow antibiotic coverage  once a source becomes clearer. -ceftriaxone should have covered normal sources of urinary infection as well as pneumonia.  It is possible that he had an aspiration pneumonia and ceftriaxone was not covering anaerobes in which case Zosyn is a better option. -We will DC vancomycin for now -We will follow-up urine and blood cultures -We will continue to monitor closely.  Resident discussed case with outpatient PCP who would like neurology to see the patient as well for his altered mental status.  However, I suspect that his altered mental status is secondary to an underlying infectious etiology.  We will follow-up neurology consult. -Of note, patient also had elevated troponins up to 2.6 and EKG showed some nonspecific T wave abnormalities as well as Q waves in his lateral leads. -Continue with heparin drip for now -Cardiology to follow-up  Earl LagosNarendra, Cesario Weidinger, MD 4/6/201912:44 PM

## 2017-11-19 NOTE — Progress Notes (Signed)
   Subjective: patient was evaluated this morning on rounds.  Patient was alert and oriented to person and place.  He denied any pain.  He denied any issues overnight.  Objective:  Vital signs in last 24 hours: Vitals:   11/18/17 2115 11/18/17 2130 11/18/17 2157 11/19/17 0323  BP: 124/90 123/89 125/88 (!) 122/91  Pulse: (!) 107 (!) 106 (!) 113 (!) 103  Resp: 18 13 18 18   Temp:   (!) 97.2 F (36.2 C) 99.6 F (37.6 C)  TempSrc:   Oral Oral  SpO2: 99% 98% 100% 95%  Weight:   147 lb 4.3 oz (66.8 kg) 145 lb 15.1 oz (66.2 kg)  Height:       Physical Exam  Constitutional:  Chronically ill appearing male   Cardiovascular: Normal rate, regular rhythm and normal heart sounds. Exam reveals no gallop and no friction rub.  No murmur heard. Pulmonary/Chest: Effort normal and breath sounds normal. No respiratory distress. He has no wheezes.  Musculoskeletal:  2/5 motor strength on left and right in upper and lower extremities bilaterally  Contractures of lower extremities bilaterally   Skin: Skin is warm and dry.    Assessment/Plan:  Active Problems:   Sepsis (HCC)  Sepsis 2/2 to pneumonia and UTI Patient was started on vancomycin/Zosyn on admission.  Patient is afebrile with tachycardia currently. Blood pressure stable in the 120s systolic and oxygenating well on room air.Treating for possible aspiration pneumonia and urinary infection with Zosyn. Will take off vancomycin as this is not likely a MRSA pneumonia.   -zosyn - blood culture pending - Urine culture pending  Troponinemia Elevated troponin I 2.3 on admission and remained flat.  On heparin.  EKG without acute ischemic changes.  Echo pending. - continue heparin - Echo pending - aspirin daily  H/o CVA Pt has a history of multiple CVAs, last in Feb 2019 in the setting of sepsis and has had subsequent decline in function since that time and currently residing in SNF, residual L sided deficits with lower extremity contractures.   Due to decline in neurological status in the past couple of months, worse in the past few weeks, with not a clear explantation will consult neurology for input on patient's case. -on heparin, holding plavix - aspirin daily - appreciate neurology's recommendations and input on this case  H/o DM On home metformin, SSI novolog. Most recent A1c 7.7, cbgs still elevated - novolog 5 units once -Novolog 4 U + SSI TID ac, plus nighttime SSI - adding lantus 5 units QHS   Dispo: Anticipated discharge pending clinical improvement.   Geralyn CorwinHoffman, Axton Cihlar YettemRatliff, DO 11/19/2017, 11:06 AM Pager: [phone]

## 2017-11-19 NOTE — Progress Notes (Signed)
ANTICOAGULATION CONSULT NOTE - Initial Consult  Pharmacy Consult for heparin Indication: chest pain/ACS  Allergies  Allergen Reactions  . Atorvastatin     "leg aches"    Patient Measurements: Height: 6' (182.9 cm) Weight: 145 lb 15.1 oz (66.2 kg) IBW/kg (Calculated) : 77.6 Heparin Dosing Weight: 66.2 kg  Vital Signs: Temp: 99.6 F (37.6 C) (04/06 0323) Temp Source: Oral (04/06 0323) BP: 122/91 (04/06 0323) Pulse Rate: 103 (04/06 0323)  Labs: Recent Labs    11/18/17 1520 11/18/17 2007 11/19/17 0041 11/19/17 0244  HGB 9.8*  --   --   --   HCT 31.4*  --   --   --   PLT 375  --   --   --   HEPARINUNFRC  --   --   --  <0.10*  CREATININE 1.06  --   --   --   TROPONINI  --  2.36* 2.68*  --      Assessment: Heparin infusion for rule out ACS Troponin + CBC ok  Goal of Therapy:  Heparin level 0.3-0.7 units/ml Monitor platelets by anticoagulation protocol: Yes    Plan:  -Increase heparin to 1000 units/hr -Daily HL, CBC -Check 6 hour level   Baldemar FridayMasters, Raynie Steinhaus M 11/19/2017,4:08 AM

## 2017-11-19 NOTE — Progress Notes (Signed)
  Echocardiogram 2D Echocardiogram has been performed.  Maclain Cohron T Thaily Hackworth 11/19/2017, 9:54 AM

## 2017-11-19 NOTE — Progress Notes (Signed)
ANTICOAGULATION CONSULT NOTE  Pharmacy Consult for heparin Indication: chest pain/ACS  Allergies  Allergen Reactions  . Atorvastatin     "leg aches"    Patient Measurements: Height: 6' (182.9 cm) Weight: 145 lb 15.1 oz (66.2 kg) IBW/kg (Calculated) : 77.6 Heparin Dosing Weight: 66.2 kg  Vital Signs: Temp: 99.6 F (37.6 C) (04/06 0323) Temp Source: Oral (04/06 0323) BP: 122/91 (04/06 0323) Pulse Rate: 103 (04/06 0323)  Labs: Recent Labs    11/18/17 1520 11/18/17 2007 11/19/17 0041 11/19/17 0244 11/19/17 0556 11/19/17 0923  HGB 9.8*  --   --   --  9.4*  --   HCT 31.4*  --   --   --  29.8*  --   PLT 375  --   --   --  356  --   HEPARINUNFRC  --   --   --  <0.10*  --  <0.10*  CREATININE 1.06  --   --   --  0.88  --   TROPONINI  --  2.36* 2.68*  --  2.36*  --      Assessment: 62 yo male on heparin for r/o ACS. Pharmacy consulted to dose heparin  -heparin level is < 0.1 after increase to 1000 units/hr   Goal of Therapy:  Heparin level 0.3-0.7 units/ml Monitor platelets by anticoagulation protocol: Yes    Plan:  -Heparin bolus 2000 units x1 -Increase heparin to 1200 units/hr -Daily HL, CBC -Check 6 hour level  Harland GermanAndrew Jahrell Hamor, PharmD Clinical Pharmacist Clinical phone from 8:30-4:00 is [phone] After 4pm, please call Main Rx [phone]) for assistance. 11/19/2017 11:13 AM

## 2017-11-20 ENCOUNTER — Inpatient Hospital Stay (HOSPITAL_COMMUNITY): Payer: Medicare (Managed Care)

## 2017-11-20 DIAGNOSIS — I5020 Unspecified systolic (congestive) heart failure: Secondary | ICD-10-CM

## 2017-11-20 DIAGNOSIS — G9340 Encephalopathy, unspecified: Secondary | ICD-10-CM

## 2017-11-20 DIAGNOSIS — A419 Sepsis, unspecified organism: Principal | ICD-10-CM

## 2017-11-20 DIAGNOSIS — I214 Non-ST elevation (NSTEMI) myocardial infarction: Secondary | ICD-10-CM

## 2017-11-20 DIAGNOSIS — L8990 Pressure ulcer of unspecified site, unspecified stage: Secondary | ICD-10-CM

## 2017-11-20 LAB — BASIC METABOLIC PANEL
ANION GAP: 9 (ref 5–15)
Anion gap: 11 (ref 5–15)
Anion gap: 8 (ref 5–15)
BUN: 14 mg/dL (ref 6–20)
BUN: 12 mg/dL (ref 6–20)
BUN: 13 mg/dL (ref 6–20)
CALCIUM: 8.2 mg/dL — AB (ref 8.9–10.3)
CALCIUM: 8.4 mg/dL — AB (ref 8.9–10.3)
CHLORIDE: 115 mmol/L — AB (ref 101–111)
CHLORIDE: 113 mmol/L — AB (ref 101–111)
CO2: 23 mmol/L (ref 22–32)
CO2: 23 mmol/L (ref 22–32)
CO2: 25 mmol/L (ref 22–32)
CREATININE: 0.93 mg/dL (ref 0.61–1.24)
CREATININE: 0.78 mg/dL (ref 0.61–1.24)
Calcium: 8.4 mg/dL — ABNORMAL LOW (ref 8.9–10.3)
Chloride: 116 mmol/L — ABNORMAL HIGH (ref 101–111)
Creatinine, Ser: 0.89 mg/dL (ref 0.61–1.24)
GFR calc Af Amer: >60 mL/min (ref >60)
GFR calc Af Amer: >60 mL/min (ref >60)
GFR calc non Af Amer: >60 mL/min (ref >60)
GLUCOSE: 281 mg/dL — AB (ref 65–99)
GLUCOSE: 230 mg/dL — AB (ref 65–99)
Glucose, Bld: 238 mg/dL — ABNORMAL HIGH (ref 65–99)
POTASSIUM: 3.3 mmol/L — AB (ref 3.5–5.1)
Potassium: 3.5 mmol/L (ref 3.5–5.1)
Potassium: 3.5 mmol/L (ref 3.5–5.1)
SODIUM: 148 mmol/L — AB (ref 135–145)
Sodium: 149 mmol/L — ABNORMAL HIGH (ref 135–145)
Sodium: 146 mmol/L — ABNORMAL HIGH (ref 135–145)

## 2017-11-20 LAB — CBC
HEMATOCRIT: 30.5 % — AB (ref 39.0–52.0)
HEMOGLOBIN: 9.2 g/dL — AB (ref 13.0–17.0)
MCH: 26.4 pg (ref 26.0–34.0)
MCHC: 30.2 g/dL (ref 30.0–36.0)
MCV: 87.4 fL (ref 78.0–100.0)
Platelets: 353 10*3/uL (ref 150–400)
RBC: 3.49 MIL/uL — ABNORMAL LOW (ref 4.22–5.81)
RDW: 14.5 % (ref 11.5–15.5)
WBC: 18 10*3/uL — ABNORMAL HIGH (ref 4.0–10.5)

## 2017-11-20 LAB — VITAMIN B12: VITAMIN B 12: 650 pg/mL (ref 180–914)

## 2017-11-20 LAB — GLUCOSE, CAPILLARY
GLUCOSE-CAPILLARY: 245 mg/dL — AB (ref 65–99)
GLUCOSE-CAPILLARY: 187 mg/dL — AB (ref 65–99)
Glucose-Capillary: 238 mg/dL — ABNORMAL HIGH (ref 65–99)
Glucose-Capillary: 183 mg/dL — ABNORMAL HIGH (ref 65–99)

## 2017-11-20 LAB — HEPARIN LEVEL (UNFRACTIONATED)
Heparin Unfractionated: 0.22 IU/mL — ABNORMAL LOW (ref 0.30–0.70)
Heparin Unfractionated: 0.4 IU/mL (ref 0.30–0.70)

## 2017-11-20 MED ORDER — INSULIN GLARGINE 100 UNIT/ML ~~LOC~~ SOLN
5.0000 [IU] | Freq: Every day | SUBCUTANEOUS | Status: DC
  Administered 2017-11-20: 5 [IU] via SUBCUTANEOUS
  Filled 2017-11-20 (×2): qty 0.05

## 2017-11-20 MED ORDER — METOPROLOL TARTRATE 25 MG PO TABS
25.0000 mg | ORAL_TABLET | Freq: Two times a day (BID) | ORAL | Status: DC
  Administered 2017-11-20 – 2017-11-23 (×5): 25 mg via ORAL
  Filled 2017-11-20 (×6): qty 1

## 2017-11-20 MED ORDER — INSULIN ASPART 100 UNIT/ML ~~LOC~~ SOLN
0.0000 [IU] | SUBCUTANEOUS | Status: DC
  Administered 2017-11-20 (×2): 3 [IU] via SUBCUTANEOUS

## 2017-11-20 MED ORDER — INSULIN ASPART 100 UNIT/ML ~~LOC~~ SOLN
0.0000 [IU] | Freq: Four times a day (QID) | SUBCUTANEOUS | Status: DC
  Administered 2017-11-21: 5 [IU] via SUBCUTANEOUS

## 2017-11-20 MED ORDER — INSULIN GLARGINE 100 UNIT/ML ~~LOC~~ SOLN
10.0000 [IU] | Freq: Every day | SUBCUTANEOUS | Status: DC
  Filled 2017-11-20: qty 0.1

## 2017-11-20 MED ORDER — HEPARIN BOLUS VIA INFUSION
1000.0000 [IU] | Freq: Once | INTRAVENOUS | Status: AC
  Administered 2017-11-20: 1000 [IU] via INTRAVENOUS
  Filled 2017-11-20: qty 1000

## 2017-11-20 MED ORDER — BACLOFEN 10 MG PO TABS
10.0000 mg | ORAL_TABLET | Freq: Four times a day (QID) | ORAL | Status: DC
  Administered 2017-11-21 – 2017-11-22 (×7): 10 mg via ORAL
  Filled 2017-11-20 (×14): qty 1

## 2017-11-20 MED ORDER — POTASSIUM CL IN DEXTROSE 5% 20 MEQ/L IV SOLN
20.0000 meq | INTRAVENOUS | Status: AC
  Administered 2017-11-20 – 2017-11-21 (×2): 20 meq via INTRAVENOUS
  Filled 2017-11-20 (×3): qty 1000

## 2017-11-20 MED ORDER — HEPARIN BOLUS VIA INFUSION
2000.0000 [IU] | Freq: Once | INTRAVENOUS | Status: AC
Start: 2017-11-20 — End: 2017-11-20
  Administered 2017-11-20: 2000 [IU] via INTRAVENOUS
  Filled 2017-11-20: qty 2000

## 2017-11-20 MED ORDER — POTASSIUM CL IN DEXTROSE 5% 20 MEQ/L IV SOLN
20.0000 meq | INTRAVENOUS | Status: DC
  Administered 2017-11-20: 20 meq via INTRAVENOUS
  Filled 2017-11-20: qty 1000

## 2017-11-20 MED ORDER — HEPARIN (PORCINE) IN NACL 100-0.45 UNIT/ML-% IJ SOLN
1400.0000 [IU]/h | INTRAMUSCULAR | Status: DC
  Administered 2017-11-20: 1200 [IU]/h via INTRAVENOUS
  Administered 2017-11-21: 1400 [IU]/h via INTRAVENOUS
  Filled 2017-11-20 (×2): qty 250

## 2017-11-20 MED ORDER — RAMIPRIL 2.5 MG PO CAPS
2.5000 mg | ORAL_CAPSULE | Freq: Every day | ORAL | Status: DC
  Administered 2017-11-20 – 2017-11-23 (×4): 2.5 mg via ORAL
  Filled 2017-11-20 (×4): qty 1

## 2017-11-20 NOTE — Plan of Care (Signed)

## 2017-11-20 NOTE — Procedures (Signed)
History: 62 year old male with encephalopathy  Sedation: None  Technique: This is a 21 channel routine scalp EEG performed at the bedside with bipolar and monopolar montages arranged in accordance to the international 10/20 system of electrode placement. One channel was dedicated to EKG recording.    Background: The background is dominated by generalized irregular delta and theta activity.  During periods of relative arousal, there is a posterior dominant rhythm of 7-8 Hz which is poorly sustained.  There were sleep structures seen briefly which were symmetric.  There were no epileptiform discharges.  Photic stimulation: Physiologic driving is not performed  EEG Abnormalities: 1) generalized irregular delta and theta activities 2) slow PDR  Clinical Interpretation: This EEG is consistent with a mild to moderate generalized nonspecific cerebral dysfunction (encephalopathy). There was no seizure or seizure predisposition recorded on this study. Please note that a normal EEG does not preclude the possibility of epilepsy.   [name] Williams [name] Rader, MD [facility] [phone]  If 7pm- 7am, please page neurology on call as listed in AMION.

## 2017-11-20 NOTE — Progress Notes (Signed)
VASCULAR LAB PRELIMINARY  PRELIMINARY  PRELIMINARY  PRELIMINARY  Bilateral lower extremity venous duplex completed.    Preliminary report:  There is no obvious evidence of DVT or SVT noted in the visualized veins of the bilateral lower extremities.  Technically difficult and limited study secondary to patient laying in the fetal position and not responsive.   Jamire Shabazz, RVT 11/20/2017, 10:29 AM

## 2017-11-20 NOTE — Progress Notes (Signed)
Paged MD regarding pt's oral medicines, requested to change metoprolol to IV and continue IV fluid on him. Blood sugar checking every 4 hr in progress On assessment pt is alert times 0, incomprehensible speech, RN donot feel comfortable to give him anything by mouth even ice chips this point, mouth care in progress, will continue to care and monitor the patient  Lonia FarberRekha, RN

## 2017-11-20 NOTE — Progress Notes (Signed)
EEG completed; results pending.    

## 2017-11-20 NOTE — Progress Notes (Addendum)
NEUROHOSPITALISTS - DAILY PROGRESS NOTE   S://  No family at bedside. Patient found laying in bed in NAD. Patient found with red jello dripping out of mouth. Patient is holding fluid in his mouth and eventually swallows after repeated requests. Patient has been unwilling to eat for the past 2 days per nursing. No other acute/new events reported overnight. MRI imaging negative for acute findings.   Physical Examination   Vitals:   11/19/17 2151 11/20/17 0708 11/20/17 0712 11/20/17 0757  BP: (!) 135/91  (!) 148/98 (!) 128/93  Pulse: (!) 105  (!) 102 90  Resp: 20  20 16   Temp: 97.7 F (36.5 C)  97.9 F (36.6 C) 99.4 F (37.4 C)  TempSrc: Oral  Oral Oral  SpO2: 100%  100% 98%  Weight:  68.2 kg (150 lb 5.7 oz)    Height:       HEENT-  Normocephalic,  Cardiovascular - Regular rate and rhythm  Respiratory - Non-labored breathing, No wheezing. Abdomen - soft and non-tender, BS normal Extremities-  Evidence of muscle wasting; unable to passively straighten legs due to pain Skin-multiple decubitus ulcers  Neurological Examination  Mental Status: Awake, eyes open today, briefly attends to examiner but no eye contact. No speech output today. Makes no attempt to communicate verbally or with hand gestures. Able to follow very simple commands with delay, again, not consistently Cranial Nerves: II: Visual Fields appear full. +blink to threat bilaterally. Pupils are equal, round, and reactive to light.   III,IV, VI: EOMI without ptosis, No obvious nystagmus noted.  +tracking examiner V: Patient does not cooperate with testing, does not answer questions asked VII: Facial movement appears symmetric.  VIII: hearing appears intact to voice, open eyes when name called X: Patient does not cooperate with testing, does not respond when questions asked XI:  Patient does not cooperate with testing, does not respond when questions  asked XII: tongue appears midline without atrophy or fasciculations.  Motor: Tone is increased throughout, Left worse than Right and LE's worse than UE's. +signs of muscle wasting. Able to lift Left arm in the air and hold position. Unable to perform on Right. Able to squeeze hands bilaterally - Left greater than Right. No other spontaneous movement on strength testing. LE's contracted and patient complains of pain when legs moved or touched.   Sensor: Patient does not cooperate with testing, does not respond when questions asked. withdrawals to pain in LE's  Laboratory Results  CBC: Recent Labs  Lab 11/18/17 1520 11/19/17 0556  WBC 19.4* 18.6*  HGB 9.8* 9.4*  HCT 31.4* 29.8*  MCV 86.0 87.1  PLT 375 356   Basic Metabolic Panel: Recent Labs  Lab 11/18/17 1520 11/19/17 0556  NA 146* 149*  K 4.4 3.6  CL 109 115*  CO2 22 23  GLUCOSE 367* 332*  BUN 28* 22*  CREATININE 1.06 0.88  CALCIUM 8.9 8.4*   Thyroid Function Studies: Recent Labs    11/19/17 1449  TSH 0.310*   Cardiac Enzymes: Recent Labs  Lab 11/18/17 2007 11/19/17 0041 11/19/17 0556  TROPONINI 2.36* 2.68* 2.36*   Urinalysis:  Recent Labs  Lab 11/18/17 1846  COLORURINE YELLOW  APPEARANCEUR HAZY*  LABSPEC 1.020  PHURINE 5.0  GLUCOSEU >=500*  HGBUR SMALL*  BILIRUBINUR NEGATIVE  KETONESUR 5*  PROTEINUR NEGATIVE  NITRITE NEGATIVE  LEUKOCYTESUR MODERATE*   Imaging Results  Ct Head Wo Contrast  Result Date: 11/18/2017 CLINICAL DATA:  AMSHx of Schizophrenia Pt poor historian not verbal during CT  EXAM: CT HEAD WITHOUT CONTRAST TECHNIQUE: Contiguous axial images were obtained from the base of the skull through the vertex without intravenous contrast. COMPARISON:  10/08/2017 FINDINGS: Brain: No evidence of acute infarction, hemorrhage, hydrocephalus, extra-axial collection or mass lesion/mass effect. There is ventricular and sulcal enlargement reflecting moderate atrophy, advanced for age. There are multiple  small old lacune infarcts. Old right cerebellar infarct. Patchy white matter hypoattenuation consistent with chronic microvascular ischemic change. Vascular: No hyperdense vessel or unexpected calcification. Skull: Normal. Negative for fracture or focal lesion. Sinuses/Orbits: Globes and orbits are unremarkable. Mild to moderate left and mild right polypoid mucosal thickening. Remaining sinuses are clear. Other: None. IMPRESSION: 1. No acute intracranial abnormalities. 2. Atrophy, old infarcts and chronic microvascular ischemic change, unchanged from the prior head CT. Electronically Signed   By: Amie Portlandavid  Ormond M.D.   On: 11/18/2017 17:06   [name] Contrast  Result Date: 11/19/2017 CLINICAL DATA:  Altered mental status.  Spasticity. EXAM: MRI HEAD WITHOUT CONTRAST TECHNIQUE: Multiplanar, multiecho pulse sequences of the brain and surrounding structures were obtained without intravenous contrast. COMPARISON:  CT yesterday.  MRI 10/07/2017. FINDINGS: Brain: Diffusion imaging does not show any acute or subacute infarction. There are extensive chronic ischemic changes throughout the brainstem. There are old cerebellar infarctions on the right and there is generalized cerebellar atrophy. There are old infarctions within the thalami and basal ganglia. There are moderate chronic small-vessel ischemic changes affecting the deep and subcortical white matter. No large vessel territory infarction. No mass lesion, recent hemorrhage, hydrocephalus or extra-axial collection. Some hemosiderin deposition in the left basal ganglia related to old infarction. Vascular: Major vessels at the base of the brain show flow. There may be slow flow in the posterior circulation. Skull and upper cervical spine: Negative Sinuses/Orbits: Mucosal inflammatory changes of the left maxillary sinus. Orbits negative. Other: None IMPRESSION: No acute finding. Extensive chronic ischemic changes throughout the brain as outlined above. Electronically  Signed   By: Paulina FusiMark  Shogry M.D.   On: 11/19/2017 18:53   Mr Cervical Spine Wo Contrast  Result Date: 11/19/2017 CLINICAL DATA:  Worsening mental status and spasticity. EXAM: MRI CERVICAL SPINE WITHOUT CONTRAST TECHNIQUE: Multiplanar, multisequence MR imaging of the cervical spine was performed. No intravenous contrast was administered. COMPARISON:  CT 10/04/2017 FINDINGS: Alignment: Mild curvature convex to the right. 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 No acute bone finding. Cord: No cord compression or primary cord lesion. Posterior Fossa, vertebral arteries, paraspinal tissues: See results of brain MRI. Extensive chronic intracranial ischemic changes. Disc levels: Foramen magnum, C1-2 and C2-3 are widely patent. C3-4: Spondylosis with endplate osteophytes. Facet degeneration on the left. No compressive canal stenosis. Mild left foraminal narrowing. C4-5: Spondylosis with endplate osteophytes and bulging of the disc. Facet degeneration on the left. No compressive central canal stenosis. Mild left foraminal narrowing. C5-6: Normal interspace. C6-7: Normal interspace. C7-T1: Normal interspace. Upper thoracic region: Negative. IMPRESSION: No significant finding in the cervical region. 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 Mild left foraminal narrowing at those 2 levels. Electronically Signed   By: Paulina FusiMark  Shogry M.D.   On: 11/19/2017 18:57   Dg Chest Port 1 View  Result Date: 11/18/2017 CLINICAL DATA:  Sepsis EXAM: PORTABLE CHEST 1 VIEW COMPARISON:  10/04/2017 FINDINGS: 1519 hours. Left base atelectasis or infiltrate. No edema, pneumothorax or pleural effusion. The cardiopericardial silhouette is within normal limits for size. The visualized bony  structures of the thorax are intact. Telemetry leads overlie the chest. IMPRESSION: Left base atelectasis or pneumonia. Electronically Signed   By:  Kennith Center M.D.   On: 11/18/2017 16:01   Transthoracic Echocardiography 11/19/2017 Systolic function was moderately to  severely reduced. EF 30% to 35%. Akinesis of the mid-apicalanteroseptal myocardium.  Transesophageal Echocardiography -  10/10/2017 EF 50% to 55%. No evidence of vegetation. No evidence of thrombus  negative saline microcavitation study.  IMPRESSION AND PLAN  [name] is a 62 y.o. male with PMH of PMH of Schizophrenia, HTN, HLD, DM, CHF and Hx of CVA in 09/2017 with Left sided deficits and reported increasing AMS and spasticity, admitted for sepsis secondary to UTI and Pneumonia. Imaging thus far negative for acute findings  AMS - Likely multifactorial  Acute deconditioning with sepsis superimposed on his chronic underlying dysfunction More alert on exam today and moving Left arm with more effort. No posturing or suspicious seizure activity noted. Patient is at high risk for aspiration with pocketing fluid in mouth for extended periods  Outstanding Work-up Studies:     B/L LE Doppler                                                      PENDING EEG                                                                       PENDING  PLAN  11/20/2017  Frequent Neurochecks  Telemetry Monitoring SLP - re-evaluation for safety of swallowing Continue Aspirin/ Plavix / Statin - once medically stable B/L LE Dopplers - r/o DVT EEG - r/o Seizure Interrogate cardiac event monitor for heart arrthymias PT/OT Nutrition Consult with calorie count and feeding assistance Follow up with [facility], Dr Anne Hahn on April 15 at 11AM,  EMG/NCV scheduled for 11/28/17 with Dr. Anne Hahn Possible Botox injections which may help reduce his baclofen dose and therefore reduce sedation    FAMILY UPDATES: No family at bedside  TEAM UPDATES:Narendra, Nischal, MD    Assessment and plan discussed with with attending physician and they are in agreement.    Beryl Meager,  ANP-C [facility] 11/20/2017, 8:39 AM   Neurology to sign off once EEG testing completed and negative for seizure activity. Please call with any further questions or concerns. Thank you for this consultation.  11/20/2017 ATTENDING ASSESSMENT   I have seen the patient reviewed the note.  He is slightly easier to arouse today, though still very encephalopathic.  He moves his left arm against gravity today.  I suspect that he has multifactorial encephalopathy including sepsis, baclofen.  I would favor checking a B12 and TSH to see if there is any underlying chronic issue that could be contributing.  I think that one thing to consider would be Botox injections to help reduce his baclofen as baclofen may be contributing to his underlying encephalopathy.  Could consider decreasing his dose to 10 mg 4 times daily to see if he has any increased arousal.  At this time, no further recommendations other than  what I have outlined above.  If there remain any questions or concerns, please call.  1) B12, treat with parenteral B12 level is less than 200, if level is 200-400, send methylmalonic acid, consider treatment 2) TSH 3) consider decreasing baclofen dose 4) treat underlying metabolic issues 5) please call if any further questions or concerns remain from a neurology perspective.   Ritta Slot, MD [facility] [phone]  If 7pm- 7am, please page neurology on call as listed in AMION.

## 2017-11-20 NOTE — Progress Notes (Signed)
ANTICOAGULATION CONSULT NOTE - Follow Up Consult  Pharmacy Consult for heparin Indication: chest pain/ACS  Labs: Recent Labs    11/18/17 1520 11/18/17 2007 11/19/17 0041 11/19/17 0244 11/19/17 0556 11/19/17 0923 11/20/17 0054  HGB 9.8*  --   --   --  9.4*  --   --   HCT 31.4*  --   --   --  29.8*  --   --   PLT 375  --   --   --  356  --   --   HEPARINUNFRC  --   --   --  <0.10*  --  <0.10* 0.22*  CREATININE 1.06  --   --   --  0.88  --   --   TROPONINI  --  2.36* 2.68*  --  2.36*  --   --     Assessment: 62yo male remains subtherapeutic on heparin though getting closer to goal.  Goal of Therapy:  Heparin level 0.3-0.7 units/ml   Plan:  Will rebolus with heparin 2000 units and increase heparin gtt by ~2 units/kg/hr to 1300 units/hr and check level in 6 hours.    [name] GamblesVeronda Adylin Hankey, PharmD, BCPS  11/20/2017,1:59 AM

## 2017-11-20 NOTE — Progress Notes (Signed)
ANTICOAGULATION CONSULT NOTE  Pharmacy Consult for heparin Indication: chest pain/ACS  Allergies  Allergen Reactions  . Atorvastatin     "leg aches"    Patient Measurements: Height: 6' (182.9 cm) Weight: 150 lb 5.7 oz (68.2 kg) IBW/kg (Calculated) : 77.6 Heparin Dosing Weight: 66.2 kg  Vital Signs: Temp: 98.5 F (36.9 C) (04/07 1628) Temp Source: Oral (04/07 1628) BP: 118/83 (04/07 1628) Pulse Rate: 84 (04/07 1628)  Labs: Recent Labs    11/18/17 1520 11/18/17 2007 11/19/17 0041  11/19/17 0556 11/19/17 0923 11/20/17 0054 11/20/17 0808 11/20/17 1524  HGB 9.8*  --   --   --  9.4*  --   --  9.2*  --   HCT 31.4*  --   --   --  29.8*  --   --  30.5*  --   PLT 375  --   --   --  356  --   --  353  --   HEPARINUNFRC  --   --   --    < >  --  <0.10* 0.22* 0.40  --   CREATININE 1.06  --   --   --  0.88  --   --  0.93 0.89  TROPONINI  --  2.36* 2.68*  --  2.36*  --   --   --   --    < > = values in this interval not displayed.     Assessment: 62 yo male on heparin for r/o ACS.  This was stopped earlier today ( ~ 11pm) and pharmacy consulted to resume.  -last heparin rate was 1200 units/ht (heparin level was 0.43 this am)   Goal of Therapy:  Heparin level 0.3-0.7 units/ml Monitor platelets by anticoagulation protocol: Yes    Plan:  -Heparin bolus 1000 units x1 -restart heparin at 1200 units/hr -Daily HL, CBC -Check 6 hour level  Harland GermanAndrew Jamil Armwood, PharmD Clinical Pharmacist Clinical phone from 8:30-4:00 is [phone] After 4pm, please call Main Rx [phone]) for assistance. 11/20/2017 4:29 PM

## 2017-11-20 NOTE — Progress Notes (Signed)
  Speech Language Pathology Treatment: Dysphagia  Patient Details Name: [name] Williams Trauger MRN: [id number] DOB: Sep 22, 1955 Today's Date: 11/20/2017 Time: 1350-1405 SLP Time Calculation (min) (ACUTE ONLY): 15 min  Assessment / Plan / Recommendation Clinical Impression  Pt seen for follow-up for dysphagia. Neurology notes this morning indicate pt found holding jello, extended time and cuing required for pt to swallow. RN reports pt holding liquids or refusing. Brother, sister-in-law present and report that over the last several weeks, pt has had difficulty swallowing, holding or refusing PO. Prior to this pt was able to eat and drink without difficulty. When SLP entered, pt's mouth stained with what is presumably jello. Oral care provided with suction toothette. Pt does not respond to questions, though he is alert. When repositioning attempted, pt reports pain and does state, "my leg" when asked about location. With ice chips, pt with oral holding and no swallow initiated, despite max cues. SLP offered cup and straw sips, as pt with fairly timely swallow response with straw sips yesterday. Difficult to discern if alertness fluctuating or if pt closing eyes in refusal. Recommend NPO with meds via alternative means, but would allow ice chips and sips of water for comfort after oral care. Family in agreement. Will follow up next date for reassessment; consider palliative consult.    HPI HPI: Patient is 62 year old male with past medical history of CVA, hypertension, hyperlipidemia, diabetes, schizophrenia who presented to the ED from SNF with worsening altered mental status and fever. CXR shows left lower lobe haziness consistent with atelectasis or infiltrate. Admitted in February 2019 for similar presentation of altered mental status and fever and was found to be bacteremic from urinary source with Klebsiella. That admission was complicated with an acute CVA. Patient has been in SNF since discharge with  progressive decline. Pt evaluated by ST 09/17/17 with recommendation for regular diet, thin liquids, no further follow-up. Pt had prior brainstem and cerebellar CVA (2011, 2015) with no reported swallowing deficits.      SLP Plan  Other (Comment)(goals downgraded; not progressing)       Recommendations  Diet recommendations: NPO;Other(comment)(ice chips, sips of water only after oral care) Liquids provided via: Cup;Teaspoon;Straw Medication Administration: Via alternative means Supervision: Full supervision/cueing for compensatory strategies Postural Changes and/or Swallow Maneuvers: Seated upright 90 degrees                Oral Care Recommendations: Oral care QID;Oral care prior to ice chip/H20 Follow up Recommendations: Other (comment)(tba) SLP Visit Diagnosis: Dysphagia, unspecified (R13.10) Plan: Other (Comment)(goals downgraded; not progressing)       GO               Rondel BatonMary Beth Murel Wigle, MS, CCC-SLP Speech-Language Pathologist [phone]  Arlana LindauMary E Ryliegh Mcduffey 11/20/2017, 2:15 PM

## 2017-11-20 NOTE — Consult Note (Signed)
Cardiology Consultation:   Patient ID: Brahim Dolman; [id number]; [birth year]   Admit date: 11/18/2017 Date of Consult: 11/20/2017  Primary Care Provider: Annie Sable, NP Primary Cardiologist: Eden Emms Primary Electrophysiologist:     Patient Profile:   Tommaso Cavitt is a 62 y.o. male with a hx of CVA 2015 in 2019, hypertension, hyperlipidemia, diabetes who is being seen today for the evaluation of systolic heart failure at the request of Nischal Narendra.  History of Present Illness:   Mr. Picklesimer presented to the hospital on 4/5 with altered mental status.  He has a history of CVA, hypertension, hyperlipidemia, diabetes, and schizophrenia.  The patient was noted to have a fever and thus was treated for sepsis due to aspiration pneumonia.  His troponin on admission was elevated and peaked at 2.68.  Echocardiogram performed this admission showed an ejection fraction of 30-35%.  February 2019, he had an echo performed that showed an EF of 50-55%.  Today the patient is nonverbal.  He will open his eyes to commands but is otherwise not speaking.  Past Medical History:  Diagnosis Date  . Acute pyelonephritis   . Altered mental status, unspecified 10/04/2017  . 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 Bacteremia   . 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 Cerebral thrombosis with cerebral infarction 10/08/2017  . Chronic diastolic heart failure (HCC) 11/09/2017  . Chronic systolic congestive heart failure (HCC)   . CVA (cerebral infarction) 06/26/2014  . Diabetes mellitus   . Diabetes mellitus (HCC)   . Diplopia   . Dizziness   . DYSLIPIDEMIA 04/08/2008   Qualifier: Diagnosis of  By: Sondra Barges MD, Zac    . Elevated troponin   . Fever   . History of stroke   . HLD (hyperlipidemia) 04/08/2008   Centricity Description: DIABETES-TYPE 2 Qualifier: Diagnosis of  By: Sondra Barges MD, Zac   Centricity Description: DIABETES MELLITUS, TYPE II Qualifier: Diagnosis of  By: Sondra Barges MD, Zac    . Hypertension   .  Hypertensive heart disease with heart failure (HCC)   . 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 Intracranial vascular stenosis 06/23/2014  . Leukocytosis   . Preventative health care 08/29/2013  . Schizophrenia (HCC) 10/31/2014  . Sensory deficit, left 06/26/2014  . Sepsis (HCC)   . Stroke (HCC)   . Stroke-like symptom 06/23/2014  . TOBACCO ABUSE 04/08/2008   Qualifier: Diagnosis of  By: Sondra Barges MD, Zac      Past Surgical History:  Procedure Laterality Date  . TEE WITHOUT CARDIOVERSION N/A 10/10/2017   Procedure: TRANSESOPHAGEAL ECHOCARDIOGRAM (TEE);  Surgeon: Lewayne Bunting, MD;  Location: [facility] ENDOSCOPY;  Service: Cardiovascular;  Laterality: N/A;     Home Medications:  Prior to Admission medications   Medication Sig Start Date End Date Taking? Authorizing Provider  acetaminophen (TYLENOL) 325 MG tablet Take 650 mg by mouth every 4 (four) hours as needed for mild pain.   Yes [provider]  Amino Acids-Protein Hydrolys (FEEDING SUPPLEMENT, PRO-STAT SUGAR FREE 64,) LIQD Take 30 mLs by mouth 2 (two) times daily.   Yes [provider]  aspirin EC 325 MG EC tablet Take 1 tablet (325 mg total) by mouth daily. 10/11/17  Yes Mayo, Allyn Kenner, MD  baclofen (LIORESAL) 20 MG tablet Take 20 mg by mouth 4 (four) times daily.   Yes [provider]  cholecalciferol (VITAMIN D) 1000 units tablet Take 1,000 Units by mouth daily.   Yes [provider]  fludrocortisone (FLORINEF) 0.1 MG tablet Take 0.1 mg by  mouth daily.   Yes [provider]  insulin aspart (NOVOLOG) 100 UNIT/ML injection Inject 6-15 Units into the skin See admin instructions. Check BG and take 6 units if BG is 200-299, take 10 units if BG is 300-399, and take 15 units if BG is 400 or greater. Check BG Three times a day.   Yes [provider]  metFORMIN (GLUCOPHAGE) 1000 MG tablet TAKE 0.5 TABLETS (500 MG TOTAL) BY MOUTH 2 (TWO) TIMES DAILY WITH A MEAL. Patient taking  differently: Take 1,000 mg by mouth 2 (two) times daily with a meal.  07/05/14  Yes Angiulli, Mcarthur Rossetti, PA-C  polyethylene glycol (MIRALAX / GLYCOLAX) packet Take 17 g by mouth daily. 10/11/17  Yes Mayo, Allyn Kenner, MD  amLODipine (NORVASC) 5 MG tablet Take 5 mg by mouth daily.    [provider]  atorvastatin (LIPITOR) 80 MG tablet Take 1 tablet (80 mg total) by mouth daily at 6 PM. 10/10/17   Mayo, Allyn Kenner, MD  clopidogrel (PLAVIX) 75 MG tablet Take 1 tablet (75 mg total) by mouth daily. 07/05/14   Angiulli, Mcarthur Rossetti, PA-C  donepezil (ARICEPT) 10 MG tablet Take 10 mg by mouth at bedtime.    [provider]  lisinopril (PRINIVIL,ZESTRIL) 20 MG tablet Take 1 tablet (20 mg total) by mouth daily. Patient not taking: Reported on 11/19/2017 10/11/17   MayoAllyn Kenner, MD    Inpatient Medications: Scheduled Meds: . aspirin  81 mg Oral Daily  . baclofen  20 mg Oral QID  . donepezil  10 mg Oral QHS  . fludrocortisone  0.1 mg Oral Daily  . insulin aspart  0-15 Units Subcutaneous TID WC  . insulin aspart  0-5 Units Subcutaneous QHS  . insulin aspart  3 Units Subcutaneous TID WC  . insulin glargine  10 Units Subcutaneous QHS   Continuous Infusions: . dextrose 5 % with KCl 20 mEq / L    . piperacillin-tazobactam (ZOSYN)  IV Stopped (11/20/17 0928)   PRN Meds: acetaminophen **OR** acetaminophen  Allergies:    Allergies  Allergen Reactions  . Atorvastatin     "leg aches"    Social History:   Social History   Socioeconomic History  . Marital status: Legally Separated    Spouse name: Not on file  . Number of children: 0  . Years of education: 12th  . Highest education level: Not on file  Occupational History  . Not on file  Social Needs  . Financial resource strain: Not on file  . Food insecurity:    Worry: Not on file    Inability: Not on file  . Transportation needs:    Medical: Not on file    Non-medical: Not on file  Tobacco Use  . Smoking status: Current  Every Day Smoker    Packs/day: 1.00    Years: 10.00    Pack years: 10.00    Types: Cigarettes  . Smokeless tobacco: Never Used  Substance and Sexual Activity  . Alcohol use: No  . Drug use: Not Currently    Comment: marijuana occassionally   . Sexual activity: Never  Lifestyle  . Physical activity:    Days per week: Patient refused    Minutes per session: Patient refused  . Stress: Not on file  Relationships  . Social connections:    Talks on phone: Patient refused    Gets together: Patient refused    Attends religious service: Patient refused    Active member of club or  organization: Patient refused    Attends meetings of clubs or organizations: Patient refused    Relationship status: Patient refused  . Intimate partner violence:    Fear of current or ex partner: Not on file    Emotionally abused: Not on file    Physically abused: Not on file    Forced sexual activity: Not on file  Other Topics Concern  . Not on file  Social History Narrative   Does regular exercise.   Has never worked, sister takes care of him    Family History:    Family History  Problem Relation Age of Onset  . Hypertension Mother   . Heart attack Mother        deceased from MI in 22s  . Hypertension Father   . Coronary artery disease Maternal Aunt   . Diabetes Maternal Aunt   . Coronary artery disease Maternal Uncle   . Diabetes Maternal Uncle   . Diabetes Paternal Aunt   . Coronary artery disease Paternal Aunt   . Diabetes Paternal Uncle   . Coronary artery disease Paternal Uncle   . Heart attack Sister        deceased from MI in 20s     ROS:  Please see the history of present illness.   All other ROS reviewed and negative.     Physical Exam/Data:   Vitals:   11/20/17 0708 11/20/17 0712 11/20/17 0757 11/20/17 1151  BP:  (!) 148/98 (!) 128/93 122/83  Pulse:  (!) 102 90 (!) 103  Resp:  20 16 14   Temp:  97.9 F (36.6 C) 99.4 F (37.4 C) 100.1 F (37.8 C)  TempSrc:  Oral Oral  Oral  SpO2:  100% 98% 100%  Weight: 150 lb 5.7 oz (68.2 kg)     Height:        Intake/Output Summary (Last 24 hours) at 11/20/2017 1306 Last data filed at 11/20/2017 1049 Gross per 24 hour  Intake 2524.52 ml  Output 1565 ml  Net 959.52 ml   Filed Weights   11/18/17 2157 11/19/17 0323 11/20/17 0708  Weight: 147 lb 4.3 oz (66.8 kg) 145 lb 15.1 oz (66.2 kg) 150 lb 5.7 oz (68.2 kg)   Body mass index is 20.39 kg/m.  General: Contractures and legs, ill-appearing, in no acute distress HEENT: normal Lymph: no adenopathy Neck: no JVD Endocrine:  No thryomegaly Vascular: No carotid bruits; FA pulses 2+ bilaterally without bruits  Cardiac:  normal S1, S2; RRR; no murmur  Lungs:  clear to auscultation bilaterally, no wheezing, rhonchi or rales  Abd: soft, nontender, no hepatomegaly  Ext: no edema Musculoskeletal:  No deformities, BUE and BLE strength normal and equal Skin: warm and dry ulcers on feet Neuro: Extremity contractures Psych:  Normal affect   EKG:  The EKG was personally reviewed and demonstrates: Anus rhythm, LVH, diffuse T wave inversions Telemetry:  Telemetry was personally reviewed and demonstrates:  Sinus tachycardia  Relevant CV Studies: TTE 11/19/17 - Left ventricle: The cavity size was mildly dilated. Wall   thickness was normal. Systolic function was moderately to   severely reduced. The estimated ejection fraction was in the   range of 30% to 35%. Akinesis of the mid-apicalanteroseptal   myocardium.  Laboratory Data:  Chemistry Recent Labs  Lab 11/18/17 1520 11/19/17 0556 11/20/17 0808  NA 146* 149* 149*  K 4.4 3.6 3.5  CL 109 115* 115*  CO2 22 23 23   GLUCOSE 367* 332* 281*  BUN 28* 22* 14  CREATININE 1.06 0.88 0.93  CALCIUM 8.9 8.4* 8.2*  GFRNONAA >60 >60 >60  GFRAA >60 >60 >60  ANIONGAP 15 11 11     Recent Labs  Lab 11/18/17 1520  PROT 6.1*  ALBUMIN 1.9*  AST 33  ALT 30  ALKPHOS 67  BILITOT 0.7   Hematology Recent Labs  Lab  11/18/17 1520 11/19/17 0556 11/20/17 0808  WBC 19.4* 18.6* 18.0*  RBC 3.65* 3.42* 3.49*  HGB 9.8* 9.4* 9.2*  HCT 31.4* 29.8* 30.5*  MCV 86.0 87.1 87.4  MCH 26.8 27.5 26.4  MCHC 31.2 31.5 30.2  RDW 14.3 14.8 14.5  PLT 375 356 353   Cardiac Enzymes Recent Labs  Lab 11/18/17 2007 11/19/17 0041 11/19/17 0556  TROPONINI 2.36* 2.68* 2.36*    Recent Labs  Lab 11/18/17 1539  TROPIPOC 2.62*    BNPNo results for input(s): BNP, PROBNP in the last 168 hours.  DDimer No results for input(s): DDIMER in the last 168 hours.  Radiology/Studies:  Ct Head Wo Contrast  Result Date: 11/18/2017 CLINICAL DATA:  AMSHx of Schizophrenia Pt poor historian not verbal during CT EXAM: CT HEAD WITHOUT CONTRAST TECHNIQUE: Contiguous axial images were obtained from the base of the skull through the vertex without intravenous contrast. COMPARISON:  10/08/2017 FINDINGS: Brain: No evidence of acute infarction, hemorrhage, hydrocephalus, extra-axial collection or mass lesion/mass effect. There is ventricular and sulcal enlargement reflecting moderate atrophy, advanced for age. There are multiple small old lacune infarcts. Old right cerebellar infarct. Patchy white matter hypoattenuation consistent with chronic microvascular ischemic change. Vascular: No hyperdense vessel or unexpected calcification. Skull: Normal. Negative for fracture or focal lesion. Sinuses/Orbits: Globes and orbits are unremarkable. Mild to moderate left and mild right polypoid mucosal thickening. Remaining sinuses are clear. Other: None. IMPRESSION: 1. No acute intracranial abnormalities. 2. Atrophy, old infarcts and chronic microvascular ischemic change, unchanged from the prior head CT. Electronically Signed   By: Amie Portland M.D.   On: 11/18/2017 17:06   [name] Contrast  Result Date: 11/19/2017 CLINICAL DATA:  Altered mental status.  Spasticity. EXAM: MRI HEAD WITHOUT CONTRAST TECHNIQUE: Multiplanar, multiecho pulse sequences of the  brain and surrounding structures were obtained without intravenous contrast. COMPARISON:  CT yesterday.  MRI 10/07/2017. FINDINGS: Brain: Diffusion imaging does not show any acute or subacute infarction. There are extensive chronic ischemic changes throughout the brainstem. There are old cerebellar infarctions on the right and there is generalized cerebellar atrophy. There are old infarctions within the thalami and basal ganglia. There are moderate chronic small-vessel ischemic changes affecting the deep and subcortical white matter. No large vessel territory infarction. No mass lesion, recent hemorrhage, hydrocephalus or extra-axial collection. Some hemosiderin deposition in the left basal ganglia related to old infarction. Vascular: Major vessels at the base of the brain show flow. There may be slow flow in the posterior circulation. Skull and upper cervical spine: Negative Sinuses/Orbits: Mucosal inflammatory changes of the left maxillary sinus. Orbits negative. Other: None IMPRESSION: No acute finding. Extensive chronic ischemic changes throughout the brain as outlined above. Electronically Signed   By: Paulina Fusi M.D.   On: 11/19/2017 18:53   Mr Cervical Spine Wo Contrast  Result Date: 11/19/2017 CLINICAL DATA:  Worsening mental status and spasticity. EXAM: MRI CERVICAL SPINE WITHOUT CONTRAST TECHNIQUE: Multiplanar, multisequence MR imaging of the cervical spine was performed. No intravenous contrast was administered. COMPARISON:  CT 10/04/2017 FINDINGS: Alignment: Mild curvature convex to the right. 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 No acute  bone finding. Cord: No cord compression or primary cord lesion. Posterior Fossa, vertebral arteries, paraspinal tissues: See results of brain MRI. Extensive chronic intracranial ischemic changes. Disc levels: Foramen magnum, C1-2 and C2-3 are widely patent. C3-4: Spondylosis with endplate osteophytes. Facet degeneration on the left. No  compressive canal stenosis. Mild left foraminal narrowing. C4-5: Spondylosis with endplate osteophytes and bulging of the disc. Facet degeneration on the left. No compressive central canal stenosis. Mild left foraminal narrowing. C5-6: Normal interspace. C6-7: Normal interspace. C7-T1: Normal interspace. Upper thoracic region: Negative. IMPRESSION: No significant finding in the cervical region. 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 Mild left foraminal narrowing at those 2 levels. Electronically Signed   By: Paulina FusiMark  Shogry M.D.   On: 11/19/2017 18:57   Dg Chest Port 1 View  Result Date: 11/18/2017 CLINICAL DATA:  Sepsis EXAM: PORTABLE CHEST 1 VIEW COMPARISON:  10/04/2017 FINDINGS: 1519 hours. Left base atelectasis or infiltrate. No edema, pneumothorax or pleural effusion. The cardiopericardial silhouette is within normal limits for size. The visualized bony structures of the thorax are intact. Telemetry leads overlie the chest. IMPRESSION: Left base atelectasis or pneumonia. Electronically Signed   By: Kennith CenterEric  Mansell M.D.   On: 11/18/2017 16:01    Assessment and Plan:   1. Non-STEMI: Patient is not verbalizing today and thus it is difficult to see if he has had any episodes of chest pain.  This could all be due to demand ischemia, though was a drop in EF, I suspect that it is due to some sort of coronary event.  He is currently on heparin which we would continue.  He is also on aspirin.  Due to his chronic medical conditions, he would likely not be a candidate for coronary catheterization.  We will work to treat his troponin elevation in heart failure medically. 2. Systolic heart failure: Possibly due to ischemic cardiomyopathy.  He is tachycardic today.  He would likely benefit from both ACE inhibitors and beta-blockers.  Will start metoprolol today.  With his altered mental status, he will likely be able to  take this medication intermittently.  Is patient is critically ill with multiple issues including both cardiac and neurologic.  It is likely a family discussion for goals of care should be initiated.   For questions or updates, please contact [facility] Please consult [email or web address] for contact info under Cardiology/STEMI.   Signed, Will Jorja LoaMartin Camnitz, MD  11/20/2017 1:06 PM

## 2017-11-20 NOTE — Progress Notes (Signed)
Pt has multiple wounds at different stages to legs, heels, buttocks and scrotum. Allevyn foam changes to areas today. Kerlex wrap over allevyn foam to feet to help dressing stay on better.  Moisture barrier applied liberally to scrotum for multiple open areas. Consult request for wound team to see and make recommendation.

## 2017-11-20 NOTE — Progress Notes (Addendum)
Subjective: no acute events overnight, patient unable to give history this morning as if deeply sedated.  On chart review this has been an ongoing problem likely worsened by his infection.    Objective:  Vital signs in last 24 hours: Vitals:   11/19/17 2151 11/20/17 0708 11/20/17 0712 11/20/17 0757  BP: (!) 135/91  (!) 148/98 (!) 128/93  Pulse: (!) 105  (!) 102 90  Resp: 20  20 16   Temp: 97.7 F (36.5 C)  97.9 F (36.6 C) 99.4 F (37.4 C)  TempSrc: Oral  Oral Oral  SpO2: 100%  100% 98%  Weight:  150 lb 5.7 oz (68.2 kg)    Height:       Physical Exam  Constitutional:  Chronically ill appearing male   Cardiovascular: Regular rhythm. Tachycardia present. Exam reveals gallop and S4. Exam reveals no friction rub.  No murmur heard. Pulmonary/Chest: Effort normal. No respiratory distress. He has no wheezes.  Coarse breath sounds throughout  Abdominal: He exhibits no distension. There is no tenderness.  Musculoskeletal:     Neurological:  Could not arouse pt this am, unresponsive to pain  Skin: Skin is warm and dry.   BMP Latest Ref Rng & Units 11/20/2017 11/19/2017 11/18/2017  Glucose 65 - 99 mg/dL 161(W) 960(A) 540(J)  BUN 6 - 20 mg/dL PENDING 81(X) 91(Y)  Creatinine 0.61 - 1.24 mg/dL 7.82 9.56 2.13  BUN/Creat Ratio 10 - 24 - - -  Sodium 135 - 145 mmol/L 149(H) 149(H) 146(H)  Potassium 3.5 - 5.1 mmol/L 3.5 3.6 4.4  Chloride 101 - 111 mmol/L 115(H) 115(H) 109  CO2 22 - 32 mmol/L 23 23 22   Calcium 8.9 - 10.3 mg/dL 8.2(L) 8.4(L) 8.9    Assessment/Plan:  Active Problems:   Sepsis (HCC)  Sepsis 2/2 to pneumonia and UTI Patient was started on vancomycin/Zosyn on admission.  Patient is afebrile with tachycardia currently. Blood pressure stable in the 120s systolic and oxygenating well on room air.Treating for possible aspiration pneumonia  with Zosyn. D/ced vancomycin as this is not likely a MRSA pneumonia.    - zosyn - blood culture no growth to date - Urine culture no  growth  Ischemic cardiomyopathy: NSTEMI with Elevated troponin I 2.3 on admission and remained flat.  On heparin.  Pt likely with evolving anteroseptal  infarct prior to admission.  EKG without acute ischemic changes.  Echo demonstrating EF 30-35%, akinesis of mid apicalanteroseptal myocardium prior ECHO in February EF 50-55% and NRWMA .  - cardiology consulted - continue heparin - aspirin daily - will repeat ecg this am  Continuing Neurological decline, H/o multiple CVAs: Pt has a history of multiple CVAs, last in Feb 2019 in the setting of sepsis and has had subsequent decline in function since that time and currently residing in SNF, residual L sided deficits with lower extremity contractures.  Continued decline in neurological status in the past couple of months, worse in the past few weeks, with not a clear explantation will consult neurology for input on patient's case. Pt now with sespis like picture likely 2/2 aspiration pneumonia taxing a brain with poor reserve.    - on heparin, holding plavix - aspirin daily - treating hypernatremia - appreciate neurology's recommendations and input on this case - MRI without acute infarct but likely worsening of chronic vascular disease - MRI cervical spine shows only mild foraminal narrowing at C4-6, Venous duplex negative, EEG pending  Hypernatremia: Sodium 149 on 4/6, was 146 on 4/5,  Was 135  a month ago, was started on florinef for blood pressure  -already received fludricortisone dose for today, will hold tomorrow if bp allows -will consider gentle free water given pts heart failure will base this on am bmet   H/o DM On home metformin, SSI novolog. Most recent A1c 7.7, cbgs still elevated  - novolog 5 units once -Novolog 4 U + SSI TID ac, plus nighttime SSI - lantus 10 u QHS     Dispo: Anticipated discharge pending clinical improvement.   Angelita InglesWinfrey, [name] Nobbe Williams, [name] Williams 11/20/2017, 8:38 AM Pager: [phone]

## 2017-11-21 ENCOUNTER — Inpatient Hospital Stay (HOSPITAL_COMMUNITY): Payer: Medicare (Managed Care)

## 2017-11-21 DIAGNOSIS — L97509 Non-pressure chronic ulcer of other part of unspecified foot with unspecified severity: Secondary | ICD-10-CM

## 2017-11-21 DIAGNOSIS — L989 Disorder of the skin and subcutaneous tissue, unspecified: Secondary | ICD-10-CM

## 2017-11-21 DIAGNOSIS — E11621 Type 2 diabetes mellitus with foot ulcer: Secondary | ICD-10-CM

## 2017-11-21 DIAGNOSIS — E1151 Type 2 diabetes mellitus with diabetic peripheral angiopathy without gangrene: Secondary | ICD-10-CM

## 2017-11-21 DIAGNOSIS — E870 Hyperosmolality and hypernatremia: Secondary | ICD-10-CM

## 2017-11-21 DIAGNOSIS — I255 Ischemic cardiomyopathy: Secondary | ICD-10-CM

## 2017-11-21 DIAGNOSIS — R008 Other abnormalities of heart beat: Secondary | ICD-10-CM

## 2017-11-21 DIAGNOSIS — L8990 Pressure ulcer of unspecified site, unspecified stage: Secondary | ICD-10-CM

## 2017-11-21 DIAGNOSIS — L97408 Non-pressure chronic ulcer of unspecified heel and midfoot with other specified severity: Secondary | ICD-10-CM

## 2017-11-21 DIAGNOSIS — L89150 Pressure ulcer of sacral region, unstageable: Secondary | ICD-10-CM

## 2017-11-21 LAB — CBC
HCT: 30.4 % — ABNORMAL LOW (ref 39.0–52.0)
Hemoglobin: 9.6 g/dL — ABNORMAL LOW (ref 13.0–17.0)
MCH: 27.6 pg (ref 26.0–34.0)
MCHC: 31.6 g/dL (ref 30.0–36.0)
MCV: 87.4 fL (ref 78.0–100.0)
PLATELETS: 305 10*3/uL (ref 150–400)
RBC: 3.48 MIL/uL — AB (ref 4.22–5.81)
RDW: 14.4 % (ref 11.5–15.5)
WBC: 17.8 10*3/uL — ABNORMAL HIGH (ref 4.0–10.5)

## 2017-11-21 LAB — GLUCOSE, CAPILLARY
GLUCOSE-CAPILLARY: 335 mg/dL — AB (ref 65–99)
GLUCOSE-CAPILLARY: 306 mg/dL — AB (ref 65–99)
Glucose-Capillary: 241 mg/dL — ABNORMAL HIGH (ref 65–99)
Glucose-Capillary: 282 mg/dL — ABNORMAL HIGH (ref 65–99)

## 2017-11-21 LAB — BASIC METABOLIC PANEL
Anion gap: 11 (ref 5–15)
BUN: 10 mg/dL (ref 6–20)
CO2: 23 mmol/L (ref 22–32)
Calcium: 8.2 mg/dL — ABNORMAL LOW (ref 8.9–10.3)
Chloride: 110 mmol/L (ref 101–111)
Creatinine, Ser: 0.8 mg/dL (ref 0.61–1.24)
GFR calc non Af Amer: >60 mL/min (ref >60)
Glucose, Bld: 289 mg/dL — ABNORMAL HIGH (ref 65–99)
Potassium: 3.3 mmol/L — ABNORMAL LOW (ref 3.5–5.1)
Sodium: 144 mmol/L (ref 135–145)

## 2017-11-21 LAB — HEPARIN LEVEL (UNFRACTIONATED): Heparin Unfractionated: <0.1 IU/mL — ABNORMAL LOW (ref 0.30–0.70)

## 2017-11-21 MED ORDER — POTASSIUM CL IN DEXTROSE 5% 20 MEQ/L IV SOLN
20.0000 meq | INTRAVENOUS | Status: AC
  Administered 2017-11-21: 20 meq via INTRAVENOUS
  Filled 2017-11-21: qty 1000

## 2017-11-21 MED ORDER — INSULIN ASPART 100 UNIT/ML ~~LOC~~ SOLN
0.0000 [IU] | Freq: Three times a day (TID) | SUBCUTANEOUS | Status: DC
  Administered 2017-11-21 – 2017-11-22 (×2): 11 [IU] via SUBCUTANEOUS
  Administered 2017-11-22: 5 [IU] via SUBCUTANEOUS

## 2017-11-21 MED ORDER — HEPARIN BOLUS VIA INFUSION
2000.0000 [IU] | Freq: Once | INTRAVENOUS | Status: AC
  Administered 2017-11-21: 2000 [IU] via INTRAVENOUS
  Filled 2017-11-21: qty 2000

## 2017-11-21 MED ORDER — COLLAGENASE 250 UNIT/GM EX OINT
TOPICAL_OINTMENT | Freq: Every day | CUTANEOUS | Status: DC
  Filled 2017-11-21: qty 30

## 2017-11-21 MED ORDER — ASPIRIN 81 MG PO CHEW
81.0000 mg | CHEWABLE_TABLET | Freq: Every day | ORAL | Status: DC
  Administered 2017-11-23: 81 mg via ORAL
  Filled 2017-11-21: qty 1

## 2017-11-21 MED ORDER — POTASSIUM CHLORIDE 20 MEQ/15ML (10%) PO SOLN
40.0000 meq | Freq: Every day | ORAL | Status: DC
  Administered 2017-11-21: 40 meq via ORAL
  Filled 2017-11-21: qty 30

## 2017-11-21 MED ORDER — COLLAGENASE 250 UNIT/GM EX OINT
TOPICAL_OINTMENT | Freq: Every day | CUTANEOUS | Status: DC
  Administered 2017-11-21: 1 via TOPICAL
  Administered 2017-11-22: 10:00:00 via TOPICAL
  Filled 2017-11-21: qty 30

## 2017-11-21 MED ORDER — RESOURCE THICKENUP CLEAR PO POWD
ORAL | Status: DC | PRN
  Administered 2017-11-23: 14:00:00 via ORAL
  Filled 2017-11-21: qty 125

## 2017-11-21 MED ORDER — HEPARIN BOLUS VIA INFUSION
3000.0000 [IU] | Freq: Once | INTRAVENOUS | Status: AC
  Administered 2017-11-21: 3000 [IU] via INTRAVENOUS
  Filled 2017-11-21: qty 3000

## 2017-11-21 MED ORDER — ASPIRIN 81 MG PO CHEW
81.0000 mg | CHEWABLE_TABLET | ORAL | Status: AC
  Administered 2017-11-22: 81 mg via ORAL
  Filled 2017-11-21: qty 1

## 2017-11-21 MED ORDER — HEPARIN (PORCINE) IN NACL 100-0.45 UNIT/ML-% IJ SOLN
1800.0000 [IU]/h | INTRAMUSCULAR | Status: DC
  Administered 2017-11-23: 1800 [IU]/h via INTRAVENOUS
  Filled 2017-11-21 (×2): qty 250

## 2017-11-21 MED ORDER — ADULT MULTIVITAMIN W/MINERALS CH
1.0000 | ORAL_TABLET | Freq: Every day | ORAL | Status: DC
  Administered 2017-11-21 – 2017-11-23 (×3): 1 via ORAL
  Filled 2017-11-21 (×3): qty 1

## 2017-11-21 MED ORDER — INSULIN GLARGINE 100 UNIT/ML ~~LOC~~ SOLN
8.0000 [IU] | Freq: Every day | SUBCUTANEOUS | Status: DC
  Administered 2017-11-21 – 2017-11-22 (×2): 8 [IU] via SUBCUTANEOUS
  Filled 2017-11-21 (×2): qty 0.08

## 2017-11-21 MED ORDER — COLLAGENASE 250 UNIT/GM EX OINT
TOPICAL_OINTMENT | Freq: Every day | CUTANEOUS | Status: DC
  Administered 2017-11-21: 1 via TOPICAL
  Administered 2017-11-22: 14:00:00 via TOPICAL
  Filled 2017-11-21: qty 30

## 2017-11-21 NOTE — Progress Notes (Signed)
Upon bedside report patient was asleep and hard to arouse CBG and vital taking found to be abnormal with low pressures and 02 sat 68 and questionable applied non rebreather 02sats 100% then 5ln/c 3l/[place] sat 100%. Slow delayed response that worse today, neuro and speech following.

## 2017-11-21 NOTE — Progress Notes (Addendum)
Subjective: no acute events overnight, patient more alert today, giving one word answers, denies being in any pain or discomfort.    Objective:  Vital signs in last 24 hours: Vitals:   11/20/17 1628 11/20/17 1920 11/21/17 0024 11/21/17 0404  BP: 118/83 (!) 124/91 115/73 (!) 125/92  Pulse: 84 84 77 83  Resp: 18 20 18 18   Temp: 98.5 F (36.9 C) 97.6 F (36.4 C) 99.3 F (37.4 C) 97.7 F (36.5 C)  TempSrc: Oral Oral Oral Oral  SpO2: 100% 100% 100% 100%  Weight:    151 lb 3.8 oz (68.6 kg)  Height:       Physical Exam  Constitutional:  Chronically ill appearing male   Cardiovascular: Regular rhythm. Exam reveals gallop and S4. Exam reveals no friction rub.  No murmur heard. Pulmonary/Chest: Effort normal. No respiratory distress. He has no wheezes.  Coarse breath sounds throughout  Abdominal: He exhibits no distension. There is no tenderness.  Musculoskeletal:     Neurological: He is alert.  Skin: Skin is warm and dry.   BMP Latest Ref Rng & Units 11/21/2017 11/20/2017 11/20/2017  Glucose 65 - 99 mg/dL 161(W289(H) 960(A238(H) 540(J230(H)  BUN 6 - 20 mg/dL 10 13 12   Creatinine 0.61 - 1.24 mg/dL 8.110.80 9.140.78 7.820.89  BUN/Creat Ratio 10 - 24 - - -  Sodium 135 - 145 mmol/L 144 146(H) 148(H)  Potassium 3.5 - 5.1 mmol/L 3.3(L) 3.5 3.3(L)  Chloride 101 - 111 mmol/L 110 113(H) 116(H)  CO2 22 - 32 mmol/L 23 25 23   Calcium 8.9 - 10.3 mg/dL 8.2(L) 8.4(L) 8.4(L)    Assessment/Plan:  Active Problems:   Sepsis (HCC)   Pressure injury of skin  Sepsis 2/2 to pneumonia and UTI Patient was started on vancomycin/Zosyn on admission.  Patient is afebrile with tachycardia currently. Blood pressure stable in the 120s systolic and oxygenating well on room air.Treating for possible aspiration pneumonia  with Zosyn. D/ced vancomycin as this is not likely a MRSA pneumonia.    - zosyn - blood culture no growth to date - Urine culture no growth  Ischemic cardiomyopathy: NSTEMI with Elevated troponin I 2.3 on  admission and remained flat.  On heparin.  Pt likely with evolving anteroseptal  infarct prior to admission.  EKG without acute ischemic changes.  Echo demonstrating EF 30-35%, akinesis of mid apicalanteroseptal myocardium prior ECHO in February EF 50-55% and NRWMA .  - cardiology consulted added metoprolol, pt not able to take pills yesterday - continue heparin - aspirin daily - will consider possibility of cath when pt stabilizes based on goals of care  Continuing Neurological decline, H/o multiple CVAs: Pt has a history of multiple CVAs, last in Feb 2019 in the setting of sepsis and has had subsequent decline in function since that time and currently residing in SNF, residual L sided deficits with lower extremity contractures.  Continued decline in neurological status in the past couple of months, worse in the past few weeks, with not a clear explantation will consult neurology for input on patient's case. Pt now with sespis like picture likely 2/2 aspiration pneumonia taxing a brain with poor reserve.    - on heparin, holding plavix - aspirin daily - treating hypernatremia - appreciate neurology's recommendations and input on this case - decreased baclofen dosage  - MRI without acute infarct but likely worsening of chronic vascular disease - MRI cervical spine shows only mild foraminal narrowing at C4-6, Venous duplex negative, EEG pending  Hypernatremia: Sodium 149 on  4/6, was 146 on 4/5,  Was 135 a month ago, was started on florinef for blood pressure  -holding fludricortisone today -gentle D5W, hypernatremia now upper limit of normal switching to maintenance fluids   H/o DM On home metformin, SSI novolog. Most recent A1c 7.7, cbgs still elevated  -lantus 5 units daily -SSI Q6 hours  Multiple Wounds: pt with heel ulcerations that are non healing due to peripheral vascular disease, also with toe and other areas of foot,  sacral decubitus ulcer, shin and scrotal lesions  -will get  ABI's -consult surgery for possibility of debridement or more invasive interventions if necessary   Dispo: Anticipated discharge pending clinical improvement, will consult with palliative care for goals of care discussion.   Angelita Ingles, MD 11/21/2017, 9:34 AM Pager: [phone]

## 2017-11-21 NOTE — Progress Notes (Signed)
ANTICOAGULATION CONSULT NOTE - Follow Up Consult  Pharmacy Consult for heparin Indication: chest pain/ACS  Labs: Recent Labs    11/18/17 1520 11/18/17 2007 11/19/17 0041  11/19/17 0556  11/20/17 0054 11/20/17 0808 11/20/17 1524 11/20/17 2242  HGB 9.8*  --   --   --  9.4*  --   --  9.2*  --   --   HCT 31.4*  --   --   --  29.8*  --   --  30.5*  --   --   PLT 375  --   --   --  356  --   --  353  --   --   HEPARINUNFRC  --   --   --    < >  --    < > 0.22* 0.40  --  <0.10*  CREATININE 1.06  --   --   --  0.88  --   --  0.93 0.89 0.78  TROPONINI  --  2.36* 2.68*  --  2.36*  --   --   --   --   --    < > = values in this interval not displayed.    Assessment: 61yo male subtherapeutic on heparin after resumed.  Goal of Therapy:  Heparin level 0.3-0.7 units/ml   Plan:  Will rebolus with heparin 2000 units and increase heparin gtt to previously therapeutic rate of 1300 units/hr and check level in 6 hours.    [name] GamblesVeronda Sidrah Harden, PharmD, BCPS  11/21/2017,12:33 AM

## 2017-11-21 NOTE — Progress Notes (Addendum)
Initial Nutrition Assessment  DOCUMENTATION CODES:   Not applicable  INTERVENTION:   -Magic Cup TID with meals, each supplement provides 290 kcals and 9 grams protein -MVI daily  NUTRITION DIAGNOSIS:   Increased nutrient needs related to wound healing as evidenced by estimated needs.  GOAL:   Patient will meet greater than or equal to 90% of their needs  MONITOR:   PO intake, Supplement acceptance, Diet advancement, Labs, Weight trends, Skin, I & O's  REASON FOR ASSESSMENT:   Low Braden    ASSESSMENT:   [name] Williams is a 62 yo M with a past medical history of CVA (2015, Feb 2019), HTN, HLD, DM, CVA, schizophrenia who presented to the ED from SNF due to altered mental status and fever. No family present at bedside, history obtained through chart review and review of physical records sent by [facility] PCP.  Pt admitted with fever and AMS.   4/8- s/p BSE- recommend NPO, for MBSS today (advanced to a dysphagia 1 diet with honey thick liquids)  Pt very lethargic at time of visit; opened eyes slightly when name was called and to touch, however, did not respond to RD questions. No family at bedside to provide additional history. Per SLP, pt diet advanced, liquids to be administered by spoon only. Noted pt consumed 50% of lunch meal.  Reviewed wt hx; pt wt has bene stable over the past month. Pt has a distant hx of wt loss, however, very limited recent wt readings available to assess more acute changes.  Pt at risk for malnutrition, however, unable to identify at this time. Suspect poor oral intake PTA; also suspect poor po's during hospitalization given pt's current mental status. Palliative care consult pending to discuss goals of care.  Last Hgb A1c: 7.7 (10/04/17). PTA DM medications 500 mg metformin BID and 6-15 units insulin aspart TID with meals. Suspect increased HGB A1c and CBGS related to acute illness as well as wound healing.  Labs reviewed: K: 3.3, CBGS: 183-241 (inpatient  orders for glycemic control are 0-15 units insulin aspart every 6 hours, 3 units insulin aspart TID with meals, and 5 units insulin glargine q HS).   NUTRITION - FOCUSED PHYSICAL EXAM:    Most Recent Value  Orbital Region  Mild depletion  Upper Arm Region  No depletion  Thoracic and Lumbar Region  No depletion  Buccal Region  Mild depletion  Temple Region  Moderate depletion  Clavicle Bone Region  Mild depletion  Clavicle and Acromion Bone Region  No depletion  Scapular Bone Region  No depletion  Dorsal Hand  No depletion  Patellar Region  No depletion  Anterior Thigh Region  No depletion  Posterior Calf Region  Mild depletion  Edema (RD Assessment)  Mild  Hair  Reviewed  Eyes  Reviewed  Mouth  Reviewed  Skin  Reviewed  Nails  Reviewed       Diet Order:  DIET - DYS 1 Room service appropriate? Yes; Fluid consistency: Thin Diet NPO time specified Except for: Sips with Meds  EDUCATION NEEDS:   Not appropriate for education at this time  Skin:  Skin Assessment: Skin Integrity Issues: Skin Integrity Issues:: Unstageable, Other (Comment) Unstageable: rt lower leg, lt medial foot, lt lateral ankle Other: rt toe , lt fifth toe, lt fifth metatarsal head darkened without drainage  Last BM:  11/20/17  Height:   Ht Readings from Last 1 Encounters:  11/18/17 6' (1.829 m)    Weight:   Wt Readings from Last  1 Encounters:  11/21/17 151 lb 3.8 oz (68.6 kg)    Ideal Body Weight:  80.9 kg  BMI:  Body mass index is 20.51 kg/m.  Estimated Nutritional Needs:   Kcal:  1850-2050  Protein:  105-120 grams  Fluid:  1.8-2.0 L    [name] Williams, RD, LDN, CDE Pager: [phone] After hours Pager: [phone]

## 2017-11-21 NOTE — Progress Notes (Signed)
Patient returned to floor. Cardiology APP called and requested next of kin contact information and stated patient may require cardiac catheterization and consent.  12 Lead EKG ordered.

## 2017-11-21 NOTE — Progress Notes (Signed)
  Speech Language Pathology Treatment: Dysphagia  Patient Details Name: [name] NeerJames Williams MRN: [id number] DOB: 1955/09/17 Today's Date: 11/21/2017 Time: [id number] SLP Time Calculation (min) (ACUTE ONLY): 15 min  Assessment / Plan / Recommendation Clinical Impression  Pt alert, following directions, flat affect and oral care performed. Mild-moderately increased oral transit from previous session with question of discoordination marked by audible swallow, immediate cough and suspected pharyngeal residue. Total assist to feed. Needs MBS to fully assess and scheduled for today at 10:30.   HPI HPI: Patient is 62 year old male with past medical history of CVA, hypertension, hyperlipidemia, diabetes, schizophrenia who presented to the ED from SNF with worsening altered mental status and fever. CXR shows left lower lobe haziness consistent with atelectasis or infiltrate. Admitted in February 2019 for similar presentation of altered mental status and fever and was found to be bacteremic from urinary source with Klebsiella. That admission was complicated with an acute CVA. Patient has been in SNF since discharge with progressive decline. Pt evaluated by ST 09/17/17 with recommendation for regular diet, thin liquids, no further follow-up. Pt had prior brainstem and cerebellar CVA (2011, 2015) with no reported swallowing deficits.      SLP Plan  MBS       Recommendations  Diet recommendations: NPO                Oral Care Recommendations: Oral care QID Follow up Recommendations: Other (comment)(TBD) SLP Visit Diagnosis: Dysphagia, unspecified (R13.10) Plan: MBS       GO                [name] MacadamiaLitaker, [name] Williams 11/21/2017, 9:35 AM  Breck CoonsLisa Williams Lonell FaceLitaker M.Ed [employer]P Pager [phone]

## 2017-11-21 NOTE — Consult Note (Signed)
WOC Nurse wound consult note Reason for Consult: Multiple wounds The patient has multiple wounds on bilateral feet.  I cannot palpate a dorsalis pedis pulse on either foot.  They have the appearance of arterial wounds.  I recommend an arterial blood flow study for bilateral lower legs.  I also recommend that surgery is consulted for possible bilateral amputation.  There are unstageable wounds to bilateral heels with purulent exudate; the possibility of osteomyelitis is present.  Also, the patient has an unstageable wound to his sacral/coccyx area.  I recommend that surgery be consulted for debridement of this wound.  For now, topical approaches to wound care include the following:  1.  Apply betadine to all foot wounds every shift.  Allow to air dry and wrap in kerlex.  Do NOT apply foam dressings to any wounds on the feet. 2.  Apply santyl, then saline moistened gauze, then dry gauze to the wound on the right shin and sacral/coccyx area. 3.  An air mattress replacement is recommended.  Wounds present include: 1.  Multiple scattered erosion on the scrotum. Criticaid clear is in use, please continue with this at least twice daily, and with each perineal cleansing. 2.  Right lower leg (shin): Unstageable measures 2.8 cm x 1.9 cm x unknown depth. 3.  Right foot entire heel is unstageable with purulent drainage. 4.  Right great toe, medial first metatarsal head and mid-foot has darkened areas without drainage. 5.  Left medial foot entire area of discoloration measures 10 cm x 3 cm with one unstageable area that has yellow/brown slough and this area measures 2 cm x 2 cm x unknown depth.   6.  Left 5th toe is darkened and has a shallow open area where toes 4 and 5 rub together. 7.  Left 5th lateral metatarsal head is darkened without drainage and the discoloration extends to the mid-foot. 8.  Left lateral ankle wound measures 4.5 cm x 2.5 cm x unknown depth, is an unstageable wound. 9.  Just above this  there is a darkened area that measures 1.5 cm x 1.5 cm x unknown depth. Thank you for the consult.  Discussed plan of care with the patient and bedside nurse.  WOC nurse will not follow at this time.  Please re-consult the WOC team if needed.  Helmut MusterSherry Beena Catano, RN, MSN, CWOCN, CNS-BC, pager [phone]

## 2017-11-21 NOTE — Progress Notes (Signed)
Patient being transported to swallow eval via speech.  Heparin was bolused with 2,0000 units and rate increased to 14 ml/hr.  Wound consult completed and dressing changes ordered for perineal area, bilateral feet, and right shin.  Will be completed today.  Patient updated on plan of care.

## 2017-11-21 NOTE — Progress Notes (Signed)
Modified Barium Swallow Progress Note  Patient Details  Name: [name] Williams MRN: [id number] Date of Birth: 1956/03/17  Today's Date: 11/21/2017  Modified Barium Swallow completed.  Full report located under Chart Review in the Imaging Section.  Brief recommendations include the following:  Clinical Impression  Moderate-severely decreased lingual manipulation/propulsion, cohesion leading to delayed transit and intermittent lingual residue. Sensory deficits likely in combination with cognitive impairments resulted in swallow initiation at the pyriform sinuses for majority of swallows. Required multiple verbal and tactile cues to propel bolus and initiated swallow with barium sitting in valleculae or pyriform sinuses. Laryngeal penetration to vocal cords with nectar consistency before laryngeal protection initiated and aspirated during the study without sensation and unable to throat clear/cough when cued. High aspiration risk with honey thick via cup due to delayed swallow initiation and moderate valleculae and pyriform sinsus residue although teaspoon amounts consistently did not enter vestibule and no appreciable residue with tsp honey and puree. Recommend honey thick liquids via TSP only and puree, check oral cavity at end of meals, ensure pt swallowed before subsequent bites, full assist/supervision and crush pills.    Swallow Evaluation Recommendations       SLP Diet Recommendations: Honey thick liquids;Dysphagia 1 (Puree) solids   Liquid Administration via: Spoon   Medication Administration: Crushed with puree   Supervision: Full assist for feeding;Staff to assist with self feeding   Compensations: Minimize environmental distractions;Slow rate;Small sips/bites;Lingual sweep for clearance of pocketing   Postural Changes: Seated upright at 90 degrees   Oral Care Recommendations: Oral care BID        [name], Sherman Lipuma Willis 11/21/2017,12:13 PM  Breck CoonsLisa Willis Lonell FaceLitaker M.Ed [employer] Pager  [phone]

## 2017-11-21 NOTE — Consult Note (Addendum)
[facility] Surgery Consult Note  [name] Williams Sep 09, 1955  [id number].    Requesting MD: Dareen Piano, MD Chief Complaint/Reason for Consult: sacral wound HPI:  [name] Williams is a 62 year old African-American male with a past medical history of diabetes, stroke (2015, 2019) on Plavix, hypertension, hyperlipidemia, and peripheral vascular disease who presented to [facility] from a skilled nursing facility with a chief complaint of altered mental status.  Patient was found to be septic from UTI and pneumonia.  On admission he was also found to have EKG changes and elevated troponins and will be undergoing cardiac cath this hospitalization.  General surgery has been asked to consult regarding a sacral ulcer. Today the patient's sister is at bedside is assisting with history.  The patient is oriented to person and place but not time (1919).  He states that he does not know how long he has had a sacral ulcer and says that it is nonpainful.  Patients sister states he has been in a nursing facility since his stroke and he is not ambulatory  ROS: Review of Systems  Constitutional: Negative for chills and fever.  Respiratory: Negative for shortness of breath.   Cardiovascular: Negative for chest pain.  Gastrointestinal: Negative for abdominal pain.  All other systems reviewed and are negative.   Family History  Problem Relation Age of Onset  . Hypertension Mother   . Heart attack Mother        deceased from MI in 24s  . Hypertension Father   . Coronary artery disease Maternal Aunt   . Diabetes Maternal Aunt   . Coronary artery disease Maternal Uncle   . Diabetes Maternal Uncle   . Diabetes Paternal Aunt   . Coronary artery disease Paternal Aunt   . Diabetes Paternal Uncle   . Coronary artery disease Paternal Uncle   . Heart attack Sister        deceased from MI in 24s    Past Medical History:  Diagnosis Date  . Acute pyelonephritis   . Altered mental status, unspecified  10/04/2017  . 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 Bacteremia   . 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 Cerebral thrombosis with cerebral infarction 10/08/2017  . Chronic diastolic heart failure ([place]) 11/09/2017  . Chronic systolic congestive heart failure ([place])   . CVA (cerebral infarction) 06/26/2014  . Diabetes mellitus   . Diabetes mellitus ([place])   . Diplopia   . Dizziness   . DYSLIPIDEMIA 04/08/2008   Qualifier: Diagnosis of  By: Trinna Post MD, Zac    . Elevated troponin   . Fever   . History of stroke   . HLD (hyperlipidemia) 04/08/2008   Centricity Description: DIABETES-TYPE 2 Qualifier: Diagnosis of  By: Trinna Post MD, Zac   Centricity Description: DIABETES MELLITUS, TYPE II Qualifier: Diagnosis of  By: Trinna Post MD, Zac    . Hypertension   . Hypertensive heart disease with heart failure ([place])   . 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 Intracranial vascular stenosis 06/23/2014  . Leukocytosis   . Preventative health care 08/29/2013  . Schizophrenia ([place]) 10/31/2014  . Sensory deficit, left 06/26/2014  . Sepsis ([place])   . Stroke ([place])   . Stroke-like symptom 06/23/2014  . TOBACCO ABUSE 04/08/2008   Qualifier: Diagnosis of  By: Trinna Post MD, Zac      Past Surgical History:  Procedure Laterality Date  . TEE WITHOUT CARDIOVERSION N/A 10/10/2017   Procedure: TRANSESOPHAGEAL ECHOCARDIOGRAM (TEE);  Surgeon: Lelon Perla, MD;  [facility];  Service:  Cardiovascular;  Laterality: N/A;    Social History:  reports that he has been smoking cigarettes.  He has a 10.00 pack-year smoking history. He has never used smokeless tobacco. He reports that he has current or past drug history. He reports that he does not drink alcohol.  Allergies:  Allergies  Allergen Reactions  . Atorvastatin     "leg aches"    Medications Prior to Admission  Medication Sig Dispense Refill  . acetaminophen (TYLENOL) 325 MG tablet Take 650 mg by mouth every 4 (four) hours as needed for mild pain.    .  Amino Acids-Protein Hydrolys (FEEDING SUPPLEMENT, PRO-STAT SUGAR FREE 64,) LIQD Take 30 mLs by mouth 2 (two) times daily.    Marland Kitchen aspirin EC 325 MG EC tablet Take 1 tablet (325 mg total) by mouth daily. 30 tablet 0  . baclofen (LIORESAL) 20 MG tablet Take 20 mg by mouth 4 (four) times daily.    . cholecalciferol (VITAMIN D) 1000 units tablet Take 1,000 Units by mouth daily.    . fludrocortisone (FLORINEF) 0.1 MG tablet Take 0.1 mg by mouth daily.    . insulin aspart (NOVOLOG) 100 UNIT/ML injection Inject 6-15 Units into the skin See admin instructions. Check BG and take 6 units if BG is 200-299, take 10 units if BG is 300-399, and take 15 units if BG is 400 or greater. Check BG Three times a day.    . metFORMIN (GLUCOPHAGE) 1000 MG tablet TAKE 0.5 TABLETS (500 MG TOTAL) BY MOUTH 2 (TWO) TIMES DAILY WITH A MEAL. (Patient taking differently: Take 1,000 mg by mouth 2 (two) times daily with a meal. ) 60 tablet 3  . polyethylene glycol (MIRALAX / GLYCOLAX) packet Take 17 g by mouth daily. 14 each 0  . amLODipine (NORVASC) 5 MG tablet Take 5 mg by mouth daily.    Marland Kitchen atorvastatin (LIPITOR) 80 MG tablet Take 1 tablet (80 mg total) by mouth daily at 6 PM. 30 tablet 0  . clopidogrel (PLAVIX) 75 MG tablet Take 1 tablet (75 mg total) by mouth daily. 30 tablet 3  . donepezil (ARICEPT) 10 MG tablet Take 10 mg by mouth at bedtime.    Marland Kitchen lisinopril (PRINIVIL,ZESTRIL) 20 MG tablet Take 1 tablet (20 mg total) by mouth daily. (Patient not taking: Reported on 11/19/2017) 30 tablet 0    Blood pressure 98/73, pulse 73, temperature 98.5 F (36.9 C), temperature source Oral, resp. rate 18, height 6' (1.829 m), weight 68.6 kg (151 lb 3.8 oz), SpO2 100 %. Physical Exam: Physical Exam  Constitutional: He appears well-developed. No distress.  Appears deconditioned  HENT:  Head: Normocephalic and atraumatic.  Right Ear: External ear normal.  Left Ear: External ear normal.  Eyes: EOM are normal. Right eye exhibits no discharge.  Left eye exhibits no discharge. No scleral icterus.  Neck: Normal range of motion. No tracheal deviation present. No thyromegaly present.  Cardiovascular: Normal rate, regular rhythm and normal heart sounds. Exam reveals no friction rub.  No murmur heard. Pulmonary/Chest: Effort normal and breath sounds normal. No respiratory distress. He has no wheezes. He has no rales. He exhibits no tenderness.  Abdominal: Soft. Bowel sounds are normal. He exhibits no distension. There is no tenderness. There is no guarding.  Genitourinary:  Genitourinary Comments: Multiple, mild scrotal pressure wounds present  Musculoskeletal: He exhibits deformity. He exhibits no edema.  Decreased range of motion right upper and right lower extremities.  Contracture right wrist  Neurological: He is alert.  Oriented  to person and place  Skin: Skin is warm and dry. He is not diaphoretic.  Sacral pressure ulcer that is roughly 4 x 6 cm.  There is a thin layer of overlying slough but no signs of necrosis.  There is no underlying fluctuance or surrounding cellulitis.  There is no active drainage.    Results for orders placed or performed during the hospital encounter of 11/18/17 (from the past 48 hour(s))  TSH     Status: Abnormal   Collection Time: 11/19/17  2:49 PM  Result Value Ref Range   TSH 0.310 (L) 0.350 - 4.500 uIU/mL    Comment: Performed by a 3rd Generation assay with a functional sensitivity of <=0.01 uIU/mL. Performed at [facility], [place] [address] [place], [place] [address]   T4, free     Status: Abnormal   Collection Time: 11/19/17  2:49 PM  Result Value Ref Range   Free T4 1.25 (H) 0.61 - 1.12 ng/dL    Comment: (NOTE) Biotin ingestion may interfere with free T4 tests. If the results are inconsistent with the TSH level, previous test results, or the clinical presentation, then consider biotin interference. If needed, order repeat testing after stopping biotin. Performed at [place], [place] [address], [place], [place] [address]   Glucose, capillary     Status: Abnormal   Collection Time: 11/19/17  4:27 PM  Result Value Ref Range   Glucose-Capillary 190 (H) 65 - 99 mg/dL  Glucose, capillary     Status: Abnormal   Collection Time: 11/19/17  9:47 PM  Result Value Ref Range   Glucose-Capillary 209 (H) 65 - 99 mg/dL  Heparin level (unfractionated)     Status: Abnormal   Collection Time: 11/20/17 12:54 AM  Result Value Ref Range   Heparin Unfractionated 0.22 (L) 0.30 - 0.70 IU/mL    Comment:        IF HEPARIN RESULTS ARE BELOW EXPECTED VALUES, AND PATIENT DOSAGE HAS BEEN CONFIRMED, SUGGEST FOLLOW UP TESTING OF ANTITHROMBIN III LEVELS. Performed at [facility], [place] [address] [place], [place] [address]   Glucose, capillary     Status: Abnormal   Collection Time: 11/20/17  7:55 AM  Result Value Ref Range   Glucose-Capillary 245 (H) 65 - 99 mg/dL   Comment 1 Notify RN   CBC     Status: Abnormal   Collection Time: 11/20/17  8:08 AM  Result Value Ref Range   WBC 18.0 (H) 4.0 - 10.5 K/uL   RBC 3.49 (L) 4.22 - 5.81 MIL/uL   Hemoglobin 9.2 (L) 13.0 - 17.0 g/dL   HCT 30.5 (L) 39.0 - 52.0 %   MCV 87.4 78.0 - 100.0 fL   MCH 26.4 26.0 - 34.0 pg   MCHC 30.2 30.0 - 36.0 g/dL   RDW 14.5 11.5 - 15.5 %   Platelets 353 150 - 400 K/uL    Comment: Performed at [facility], [place] [address] [place], [place] [address]  Basic metabolic panel     Status: Abnormal   Collection Time: 11/20/17  8:08 AM  Result Value Ref Range   Sodium 149 (H) 135 - 145 mmol/L   Potassium 3.5 3.5 - 5.1 mmol/L   Chloride 115 (H) 101 - 111 mmol/L   CO2 23 22 - 32 mmol/L   Glucose, Bld 281 (H) 65 - 99 mg/dL   BUN 14 6 - 20 mg/dL   Creatinine, Ser 0.93 0.61 - 1.24 mg/dL  Calcium 8.2 (L) 8.9 - 10.3 mg/dL   GFR calc non Af Amer >60 >60 mL/min   GFR calc Af Amer >60 >60 mL/min    Comment: (NOTE) The eGFR has been calculated using the CKD EPI equation. This calculation has not been  validated in all clinical situations. eGFR's persistently <60 mL/min signify possible Chronic Kidney Disease.    Anion gap 11 5 - 15    Comment: Performed at [place] [address] [place], [place] [address]  Heparin level (unfractionated)     Status: None   Collection Time: 11/20/17  8:08 AM  Result Value Ref Range   Heparin Unfractionated 0.40 0.30 - 0.70 IU/mL    Comment:        IF HEPARIN RESULTS ARE BELOW EXPECTED VALUES, AND PATIENT DOSAGE HAS BEEN CONFIRMED, SUGGEST FOLLOW UP TESTING OF ANTITHROMBIN III LEVELS. Performed at [facility], [place] [address], [place], [place] [address]   Glucose, capillary     Status: Abnormal   Collection Time: 11/20/17 11:48 AM  Result Value Ref Range   Glucose-Capillary 238 (H) 65 - 99 mg/dL   Comment 1 Notify RN   Vitamin B12     Status: None   Collection Time: 11/20/17  3:24 PM  Result Value Ref Range   Vitamin B-12 650 180 - 914 pg/mL    Comment: (NOTE) This assay is not validated for testing neonatal or myeloproliferative syndrome specimens for Vitamin B12 levels. Performed at [facility], [place] [address] [place], [place] [address]   Basic metabolic panel     Status: Abnormal   Collection Time: 11/20/17  3:24 PM  Result Value Ref Range   Sodium 148 (H) 135 - 145 mmol/L   Potassium 3.3 (L) 3.5 - 5.1 mmol/L   Chloride 116 (H) 101 - 111 mmol/L   CO2 23 22 - 32 mmol/L   Glucose, Bld 230 (H) 65 - 99 mg/dL   BUN 12 6 - 20 mg/dL   Creatinine, Ser 0.89 0.61 - 1.24 mg/dL   Calcium 8.4 (L) 8.9 - 10.3 mg/dL   GFR calc non Af Amer >60 >60 mL/min   GFR calc Af Amer >60 >60 mL/min    Comment: (NOTE) The eGFR has been calculated using the CKD EPI equation. This calculation has not been validated in all clinical situations. eGFR's persistently <60 mL/min signify possible Chronic Kidney Disease.    Anion gap 9 5 - 15    Comment: Performed at [place] [address] [place], [place] [address]  Glucose,  capillary     Status: Abnormal   Collection Time: 11/20/17  4:26 PM  Result Value Ref Range   Glucose-Capillary 187 (H) 65 - 99 mg/dL   Comment 1 Notify RN   Glucose, capillary     Status: Abnormal   Collection Time: 11/20/17  7:40 PM  Result Value Ref Range   Glucose-Capillary 183 (H) 65 - 99 mg/dL  Heparin level (unfractionated)     Status: Abnormal   Collection Time: 11/20/17 10:42 PM  Result Value Ref Range   Heparin Unfractionated <0.10 (L) 0.30 - 0.70 IU/mL    Comment:        IF HEPARIN RESULTS ARE BELOW EXPECTED VALUES, AND PATIENT DOSAGE HAS BEEN CONFIRMED, SUGGEST FOLLOW UP TESTING OF ANTITHROMBIN III LEVELS. Performed at [facility], [place] [address], [place], [place] [address]   Basic metabolic panel     Status: Abnormal  Collection Time: 11/20/17 10:42 PM  Result Value Ref Range   Sodium 146 (H) 135 - 145 mmol/L   Potassium 3.5 3.5 - 5.1 mmol/L   Chloride 113 (H) 101 - 111 mmol/L   CO2 25 22 - 32 mmol/L   Glucose, Bld 238 (H) 65 - 99 mg/dL   BUN 13 6 - 20 mg/dL   Creatinine, Ser 0.78 0.61 - 1.24 mg/dL   Calcium 8.4 (L) 8.9 - 10.3 mg/dL   GFR calc non Af Amer >60 >60 mL/min   GFR calc Af Amer >60 >60 mL/min    Comment: (NOTE) The eGFR has been calculated using the CKD EPI equation. This calculation has not been validated in all clinical situations. eGFR's persistently <60 mL/min signify possible Chronic Kidney Disease.    Anion gap 8 5 - 15    Comment: Performed at [place] [address], [place], [place] [address]  Glucose, capillary     Status: Abnormal   Collection Time: 11/21/17  3:21 AM  Result Value Ref Range   Glucose-Capillary 241 (H) 65 - 99 mg/dL   Comment 1 Notify RN    Comment 2 Document in Chart   Basic metabolic panel     Status: Abnormal   Collection Time: 11/21/17  6:25 AM  Result Value Ref Range   Sodium 144 135 - 145 mmol/L   Potassium 3.3 (L) 3.5 - 5.1 mmol/L   Chloride 110 101 - 111 mmol/L   CO2 23 22 - 32 mmol/L    Glucose, Bld 289 (H) 65 - 99 mg/dL   BUN 10 6 - 20 mg/dL   Creatinine, Ser 0.80 0.61 - 1.24 mg/dL   Calcium 8.2 (L) 8.9 - 10.3 mg/dL   GFR calc non Af Amer >60 >60 mL/min   GFR calc Af Amer >60 >60 mL/min    Comment: (NOTE) The eGFR has been calculated using the CKD EPI equation. This calculation has not been validated in all clinical situations. eGFR's persistently <60 mL/min signify possible Chronic Kidney Disease.    Anion gap 11 5 - 15    Comment: Performed at [facility] [address] [place], [place] [address]  CBC     Status: Abnormal   Collection Time: 11/21/17  6:25 AM  Result Value Ref Range   WBC 17.8 (H) 4.0 - 10.5 K/uL   RBC 3.48 (L) 4.22 - 5.81 MIL/uL   Hemoglobin 9.6 (L) 13.0 - 17.0 g/dL   HCT 30.4 (L) 39.0 - 52.0 %   MCV 87.4 78.0 - 100.0 fL   MCH 27.6 26.0 - 34.0 pg   MCHC 31.6 30.0 - 36.0 g/dL   RDW 14.4 11.5 - 15.5 %   Platelets 305 150 - 400 K/uL    Comment: Performed at [facility], [place] [address] [place], [place] [address]  Heparin level (unfractionated)     Status: Abnormal   Collection Time: 11/21/17  6:25 AM  Result Value Ref Range   Heparin Unfractionated <0.10 (L) 0.30 - 0.70 IU/mL    Comment:        IF HEPARIN RESULTS ARE BELOW EXPECTED VALUES, AND PATIENT DOSAGE HAS BEEN CONFIRMED, SUGGEST FOLLOW UP TESTING OF ANTITHROMBIN III LEVELS. Performed at [facility], [place] [address], [place], [place] [address]   Glucose, capillary     Status: Abnormal   Collection Time: 11/21/17 11:43 AM  Result Value Ref Range   Glucose-Capillary 282 (H) 65 - 99 mg/dL  Comment 1 Notify RN    Comment 2 Document in Chart    [name] Contrast  Result Date: 11/19/2017 CLINICAL DATA:  Altered mental status.  Spasticity. EXAM: MRI HEAD WITHOUT CONTRAST TECHNIQUE: Multiplanar, multiecho pulse sequences of the brain and surrounding structures were obtained without intravenous contrast. COMPARISON:  CT yesterday.  MRI 10/07/2017. FINDINGS:  Brain: Diffusion imaging does not show any acute or subacute infarction. There are extensive chronic ischemic changes throughout the brainstem. There are old cerebellar infarctions on the right and there is generalized cerebellar atrophy. There are old infarctions within the thalami and basal ganglia. There are moderate chronic small-vessel ischemic changes affecting the deep and subcortical white matter. No large vessel territory infarction. No mass lesion, recent hemorrhage, hydrocephalus or extra-axial collection. Some hemosiderin deposition in the left basal ganglia related to old infarction. Vascular: Major vessels at the base of the brain show flow. There may be slow flow in the posterior circulation. Skull and upper cervical spine: Negative Sinuses/Orbits: Mucosal inflammatory changes of the left maxillary sinus. Orbits negative. Other: None IMPRESSION: No acute finding. Extensive chronic ischemic changes throughout the brain as outlined above. Electronically Signed   By: Nelson Chimes M.D.   On: 11/19/2017 18:53   Mr Cervical Spine Wo Contrast  Result Date: 11/19/2017 CLINICAL DATA:  Worsening mental status and spasticity. EXAM: MRI CERVICAL SPINE WITHOUT CONTRAST TECHNIQUE: Multiplanar, multisequence MR imaging of the cervical spine was performed. No intravenous contrast was administered. COMPARISON:  CT 10/04/2017 FINDINGS: Alignment: Mild curvature convex to the right. 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 No acute bone finding. Cord: No cord compression or primary cord lesion. Posterior Fossa, vertebral arteries, paraspinal tissues: See results of brain MRI. Extensive chronic intracranial ischemic changes. Disc levels: Foramen magnum, C1-2 and C2-3 are widely patent. C3-4: Spondylosis with endplate osteophytes. Facet degeneration on the left. No compressive canal stenosis. Mild left foraminal narrowing. C4-5: Spondylosis with endplate osteophytes and bulging of the disc. Facet  degeneration on the left. No compressive central canal stenosis. Mild left foraminal narrowing. C5-6: Normal interspace. C6-7: Normal interspace. C7-T1: Normal interspace. Upper thoracic region: Negative. IMPRESSION: No significant finding in the cervical region. 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 Mild left foraminal narrowing at those 2 levels. Electronically Signed   By: Nelson Chimes M.D.   On: 11/19/2017 18:57   Dg Swallowing Func-speech Pathology  Result Date: 11/21/2017 Objective Swallowing Evaluation: Type of Study: MBS-Modified Barium Swallow Study  Patient Details Name: [name] Williams MRN: [id number] Date of Birth: 1955-11-03 Today's Date: 11/21/2017 Time: SLP Start Time (ACUTE ONLY): 9518 -SLP Stop Time (ACUTE ONLY): 1044 SLP Time Calculation (min) (ACUTE ONLY): 15 min Past Medical History: Past Medical History: Diagnosis Date . Acute pyelonephritis  . Altered mental status, unspecified 10/04/2017 . 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 Bacteremia  . 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 Cerebral thrombosis with cerebral infarction 10/08/2017 . Chronic diastolic heart failure ([place]) 11/09/2017 . Chronic systolic congestive heart failure ([place])  . CVA (cerebral infarction) 06/26/2014 . Diabetes mellitus  . Diabetes mellitus ([place])  . Diplopia  . Dizziness  . DYSLIPIDEMIA 04/08/2008  Qualifier: Diagnosis of  By: Trinna Post MD, Zac   . Elevated troponin  . Fever  . History of stroke  . HLD (hyperlipidemia) 04/08/2008  Centricity Description: DIABETES-TYPE 2 Qualifier: Diagnosis of  By: Trinna Post MD, Zac   Centricity Description: DIABETES MELLITUS, TYPE II Qualifier: Diagnosis  of  By: Trinna Post MD, Zac   . Hypertension  . Hypertensive heart disease with heart failure ([place])  . 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 Intracranial vascular stenosis 06/23/2014 . Leukocytosis  . Preventative health care 08/29/2013 . Schizophrenia  ([place]) 10/31/2014 . Sensory deficit, left 06/26/2014 . Sepsis ([place])  . Stroke ([place])  . Stroke-like symptom 06/23/2014 . TOBACCO ABUSE 04/08/2008  Qualifier: Diagnosis of  By: Trinna Post MD, Zac   Past Surgical History: Past Surgical History: Procedure Laterality Date . TEE WITHOUT CARDIOVERSION N/A 10/10/2017  Procedure: TRANSESOPHAGEAL ECHOCARDIOGRAM (TEE);  Surgeon: Lelon Perla, MD;  Location: [facility] ENDOSCOPY;  Service: Cardiovascular;  Laterality: N/A; HPI: Patient is 62 year old male with past medical history of CVA, hypertension, hyperlipidemia, diabetes, schizophrenia who presented to the ED from SNF with worsening altered mental status and fever. CXR shows left lower lobe haziness consistent with atelectasis or infiltrate. Per chart admitted February 2019 for similar presentation (altered mental status,  fever and found to be bacteremic from urinary source). Pt evaluated by ST 09/17/17 with recommendation for regular diet, thin liquids, no further follow-up. Pt had prior brainstem and cerebellar CVA (2011, 2015) with no reported swallowing deficits. MBS recommended after po trial.  Subjective: Pt in bed, lethargic Assessment / Plan / Recommendation CHL IP CLINICAL IMPRESSIONS 11/21/2017 Clinical Impression Moderate-severely decreased lingual manipulation/propulsion, cohesion leading to delayed transit and intermittent lingual residue. Sensory deficits likely in combination with cognitive impairments resulted in swallow initiation at the pyriform sinuses for majority of swallows. Required multiple verbal and tactile cues to propel bolus and initiated swallow with barium sitting in valleculae or pyriform sinuses. Laryngeal penetration to vocal cords with nectar consistency before laryngeal protection initiated and aspirated during the study without sensation and unable to throat clear/cough when cued. High aspiration risk with honey thick via cup due to delayed swallow initiation and moderate valleculae and pyriform  sinsus residue although teaspoon amounts consistently did not enter vestibule and no appreciable residue with tsp honey and puree. Recommend honey thick liquids via TSP only and puree, check oral cavity at end of meals, ensure pt swallowed before subsequent bites, full assist/supervision and crush pills.  SLP Visit Diagnosis Dysphagia, oropharyngeal phase (R13.12) Attention and concentration deficit following -- Frontal lobe and executive function deficit following -- Impact on safety and function Moderate aspiration risk;Severe aspiration risk   CHL IP TREATMENT RECOMMENDATION 11/21/2017 Treatment Recommendations Therapy as outlined in treatment plan below   Prognosis 11/21/2017 Prognosis for Safe Diet Advancement Good Barriers to Reach Goals Cognitive deficits Barriers/Prognosis Comment -- CHL IP DIET RECOMMENDATION 11/21/2017 SLP Diet Recommendations Honey thick liquids;Dysphagia 1 (Puree) solids Liquid Administration via Spoon Medication Administration Crushed with puree Compensations Minimize environmental distractions;Slow rate;Small sips/bites;Lingual sweep for clearance of pocketing Postural Changes Seated upright at 90 degrees   CHL IP OTHER RECOMMENDATIONS 11/21/2017 Recommended Consults -- Oral Care Recommendations Oral care BID Other Recommendations --   CHL IP FOLLOW UP RECOMMENDATIONS 11/21/2017 Follow up Recommendations 24 hour supervision/assistance   CHL IP FREQUENCY AND DURATION 11/21/2017 Speech Therapy Frequency (ACUTE ONLY) min 2x/week Treatment Duration 2 weeks      CHL IP ORAL PHASE 11/21/2017 Oral Phase Impaired Oral - Pudding Teaspoon -- Oral - Pudding Cup -- Oral - Honey Teaspoon Delayed oral transit;Decreased bolus cohesion;Reduced posterior propulsion Oral - Honey Cup Delayed oral transit;Decreased bolus cohesion;Reduced posterior propulsion Oral - Nectar Teaspoon Delayed oral transit;Decreased bolus cohesion;Reduced posterior propulsion Oral - Nectar Cup -- Oral - Nectar Straw -- Oral - Thin Teaspoon  --  Oral - Thin Cup -- Oral - Thin Straw -- Oral - Puree Delayed oral transit;Decreased bolus cohesion;Lingual/palatal residue;Reduced posterior propulsion Oral - Mech Soft -- Oral - Regular -- Oral - Multi-Consistency -- Oral - Pill -- Oral Phase - Comment --  CHL IP PHARYNGEAL PHASE 11/21/2017 Pharyngeal Phase Impaired Pharyngeal- Pudding Teaspoon -- Pharyngeal -- Pharyngeal- Pudding Cup -- Pharyngeal -- Pharyngeal- Honey Teaspoon Delayed swallow initiation-pyriform sinuses Pharyngeal -- Pharyngeal- Honey Cup Pharyngeal residue - valleculae;Pharyngeal residue - pyriform;Reduced epiglottic inversion;Delayed swallow initiation-pyriform sinuses Pharyngeal -- Pharyngeal- Nectar Teaspoon Delayed swallow initiation-pyriform sinuses;Penetration/Aspiration before swallow Pharyngeal Material enters airway, passes BELOW cords without attempt by patient to eject out (silent aspiration) Pharyngeal- Nectar Cup -- Pharyngeal -- Pharyngeal- Nectar Straw -- Pharyngeal -- Pharyngeal- Thin Teaspoon -- Pharyngeal -- Pharyngeal- Thin Cup -- Pharyngeal -- Pharyngeal- Thin Straw -- Pharyngeal -- Pharyngeal- Puree Delayed swallow initiation-pyriform sinuses Pharyngeal -- Pharyngeal- Mechanical Soft -- Pharyngeal -- Pharyngeal- Regular -- Pharyngeal -- Pharyngeal- Multi-consistency -- Pharyngeal -- Pharyngeal- Pill -- Pharyngeal -- Pharyngeal Comment --  CHL IP CERVICAL ESOPHAGEAL PHASE 11/21/2017 Cervical Esophageal Phase WFL Pudding Teaspoon -- Pudding Cup -- Honey Teaspoon -- Honey Cup -- Nectar Teaspoon -- Nectar Cup -- Nectar Straw -- Thin Teaspoon -- Thin Cup -- Thin Straw -- Puree -- Mechanical Soft -- Regular -- Multi-consistency -- Pill -- Cervical Esophageal Comment -- No flowsheet data found. Houston Siren 11/21/2017, 12:11 PM Orbie Pyo Colvin Caroli.Ed CCC-SLP Pager [phone]              Assessment/Plan Sacral pressure ulcer No signs of infection or necrosis.  No acute surgical needs. Recommend daily hydrotherapy.   Recommend twice daily dressing changes with Santyl and wet-to-dry dressings. General surgery will sign off. Call as needed with questions or concerns.  Jill Alexanders, [facility] 11/21/2017, 2:36 PM Pager: [phone] Consults: [phone] Mon-Fri 7:00 am-4:30 pm Sat-Sun 7:00 am-11:30 am

## 2017-11-21 NOTE — Progress Notes (Signed)
Inpatient Diabetes Program Recommendations  AACE/ADA: New Consensus Statement on Inpatient Glycemic Control (2015)  Target Ranges:  Prepandial:   less than 140 mg/dL      Peak postprandial:   less than 180 mg/dL (1-2 hours)      Critically ill patients:  140 - 180 mg/dL   Results for [name] Williams, Juma (MRN [id number]) as of 11/21/2017 15:48  Ref. Range 11/20/2017 07:55 11/20/2017 11:48 11/20/2017 16:26 11/20/2017 19:40 11/21/2017 03:21 11/21/2017 11:43  Glucose-Capillary Latest Ref Range: 65 - 99 mg/dL 086245 (H) 578238 (H) 469187 (H) 183 (H) 241 (H) 282 (H)   Review of Glycemic Control  Diabetes history: DM 2 Outpatient Diabetes medications: Metformin 1000 mg BID, Novolog 6-15 units tid Current orders for Inpatient glycemic control: Lantus 5 units qhs, Novolog Moderate Correction 0-15 units tid  Inpatient Diabetes Program Recommendations:    Glucose consistently in the 200's. Please consider increasing Lantus to 8 units.  Thanks,  Christena DeemShannon Sidi Dzikowski RN, MSN, BC-ADM, [facility] Inpatient Diabetes Coordinator Team Pager [phone] (8a-5p)

## 2017-11-21 NOTE — Progress Notes (Signed)
Pharmacy Antibiotic Note  [name] NeerJames Stumpo is a 62 y.o. male admitted on 11/18/2017 with sepsis.  Pharmacy has been consulted for Zosyn dosing. Initial treatment of sepsis thought to be 2/2 to aspiration pneumonia, but now concern for multiple unstageable wounds on legs, heals and scrotum. WBC remains elevated 17.8, SCr stable 0.80. CrCl ~ 90 mL/min   Plan: -Zosyn 3.375 gm IV Q 8 hours (EI infusion) -Monitor CBC, renal fx, cultures and clinical progress  Height: 6' (182.9 cm) Weight: 151 lb 3.8 oz (68.6 kg) IBW/kg (Calculated) : 77.6  Temp (24hrs), Avg:98.6 F (37 C), Min:97.6 F (36.4 C), Max:100.1 F (37.8 C)  Recent Labs  Lab 11/18/17 1520 11/18/17 1542 11/18/17 1826 11/19/17 0556 11/20/17 0808 11/20/17 1524 11/20/17 2242 11/21/17 0625  WBC 19.4*  --   --  18.6* 18.0*  --   --  17.8*  CREATININE 1.06  --   --  0.88 0.93 0.89 0.78 0.80  LATICACIDVEN  --  1.83 1.21  --   --   --   --   --     Estimated Creatinine Clearance: 94.1 mL/min (by C-G formula based on SCr of 0.8 mg/dL).    Allergies  Allergen Reactions  . Atorvastatin     "leg aches"    Antimicrobials this admission: Vanc 4/5 >> 4/6 Zosyn 4/5 >>   Dose adjustments this admission: None   Microbiology results: 4/5 BCx>>  Thank you for allowing pharmacy to be a part of this patient's care.  Ruben Imony [employer], PharmD Clinical Pharmacist 11/21/2017 9:47 AM

## 2017-11-21 NOTE — Progress Notes (Signed)
ANTICOAGULATION CONSULT NOTE  Pharmacy Consult for heparin Indication: chest pain/ACS  Allergies  Allergen Reactions  . Atorvastatin     "leg aches"    Patient Measurements: Height: 6' (182.9 cm) Weight: 151 lb 3.8 oz (68.6 kg) IBW/kg (Calculated) : 77.6 Heparin Dosing Weight: 66.2 kg  Vital Signs: Temp: 97.7 F (36.5 C) (04/08 0404) Temp Source: Oral (04/08 0404) BP: 125/92 (04/08 0404) Pulse Rate: 83 (04/08 0404)  Labs: Recent Labs    11/18/17 2007 11/19/17 0041  11/19/17 0556  11/20/17 0808 11/20/17 1524 11/20/17 2242 11/21/17 0625  HGB  --   --   --  9.4*  --  9.2*  --   --  9.6*  HCT  --   --   --  29.8*  --  30.5*  --   --  30.4*  PLT  --   --   --  356  --  353  --   --  305  HEPARINUNFRC  --   --    < >  --    < > 0.40  --  <0.10* <0.10*  CREATININE  --   --   --  0.88  --  0.93 0.89 0.78 0.80  TROPONINI 2.36* 2.68*  --  2.36*  --   --   --   --   --    < > = values in this interval not displayed.   Assessment: 62 yo male on heparin for r/o ACS. Pharmacy consulted to dose heparin after it was held  and resumed yesterday at ~1800. Heparin resumed at previously therapeutic rate, but after one rate increase overnight the level remains undetectable. No bleeding or infusion issues reported by RN.  -heparin level is < 0.1 after increase to 1300 units/hr  Goal of Therapy:  Heparin level 0.3-0.7 units/ml Monitor platelets by anticoagulation protocol: Yes    Plan:  -Heparin bolus 2000 units x1 -Increase heparin to 1400 units/hr -Daily heparin level, CBC -Check 6 hour level  [name] Williams, PharmD Clinical Pharmacist 11/21/2017 8:26 AM

## 2017-11-21 NOTE — Progress Notes (Addendum)
Internal Medicine Attending:   I saw and examined the patient. I reviewed the resident's note and I agree with the resident's findings and plan as documented in the resident's note.  Patient more alert today and denies any acute complaints.  Patient was initially admitted for sepsis secondary to pneumonia likely secondary to aspiration.  His leukocytosis has remained stable.  He is afebrile.  We will continue with Zosyn for now.  Blood cultures no growth to date.  Patient also with worsening mental status likely secondary to his underlying infection.  Neuro follow-up and recommendations appreciated.  Baclofen dose decreased.  B12 level is within normal limits.  EEG consistent with encephalopathy.  Of note, patient also had an elevated troponin in the twos on admission and was started on a heparin drip for likely NSTEMI.  Cardiology follow-up and recommendations appreciated.  Patient had a 2D echo which showed an EF of 30-35% which is likely ischemic in nature.  Patient would like to go for a cath on Wednesday if he continues to improve cognitively.  No further workup at this time.  Patient was also noted to have an unstageable sacral decubitus ulcer.  Wound care recommended surgery consult.  Surgery recommends only hydrotherapy for now and no need for debridement at this point.  No further workup at this time.

## 2017-11-21 NOTE — Progress Notes (Signed)
ANTICOAGULATION CONSULT NOTE  Pharmacy Consult for heparin Indication: chest pain/ACS  Allergies  Allergen Reactions  . Atorvastatin     "leg aches"    Patient Measurements: Height: 6' (182.9 cm) Weight: 151 lb 3.8 oz (68.6 kg) IBW/kg (Calculated) : 77.6 Heparin Dosing Weight: 68.6 kg  Vital Signs: Temp: 98.5 F (36.9 C) (04/08 1434) Temp Source: Oral (04/08 1434) BP: 98/73 (04/08 1434) Pulse Rate: 73 (04/08 1434)  Labs: Recent Labs    11/18/17 2007 11/19/17 0041  11/19/17 0556  11/20/17 0808 11/20/17 1524 11/20/17 2242 11/21/17 0625 11/21/17 1632  HGB  --   --    < > 9.4*  --  9.2*  --   --  9.6*  --   HCT  --   --   --  29.8*  --  30.5*  --   --  30.4*  --   PLT  --   --   --  356  --  353  --   --  305  --   HEPARINUNFRC  --   --    < >  --    < > 0.40  --  <0.10* <0.10* <0.10*  CREATININE  --   --    < > 0.88  --  0.93 0.89 0.78 0.80  --   TROPONINI 2.36* 2.68*  --  2.36*  --   --   --   --   --   --    < > = values in this interval not displayed.   Assessment: 62 yo male on heparin for r/o ACS. Pharmacy consulted to dose heparin after it was held  and resumed yesterday at ~1800.   This morning the heparin level was < 0.1 after increased to 1300 units/hr.  Now the 6 hour heparin level this afternoon is still < 0.1, despite giving a 2000 unit heparin bolus and increasing heparin drip rate to 1400 units/hr.  I confirmed bolus given and correct rate with his RN.  I asked RN to check IV site for signs of infiltration.  RN will change heparin to another IV site to see if this helps.   Goal of Therapy:  Heparin level 0.3-0.7 units/ml Monitor platelets by anticoagulation protocol: Yes    Plan:  Change IV site for the heparin infusion. Give Heparin bolus 3000 units x1 -Increase heparin to 1500 units/hr -Check 6 hour level -Daily heparin level, CBC   [name] Williams, RPh Clinical Pharmacist Pager: [phone] 11/21/2017 5:53 PM

## 2017-11-21 NOTE — Progress Notes (Addendum)
Progress Note  Patient Name: [name] Williams Date of Encounter: 11/21/2017  Primary Cardiologist: Dr. Eden Emms   Subjective   Pt more alert and interactive today. Oriented to self. He states that he is "uncomfortable".   Inpatient Medications    Scheduled Meds: . aspirin  81 mg Oral Daily  . baclofen  10 mg Oral QID  . collagenase   Topical Daily  . collagenase   Topical Daily  . donepezil  10 mg Oral QHS  . insulin aspart  0-15 Units Subcutaneous Q6H  . insulin aspart  3 Units Subcutaneous TID WC  . insulin glargine  5 Units Subcutaneous QHS  . metoprolol tartrate  25 mg Oral BID  . ramipril  2.5 mg Oral Daily   Continuous Infusions: . dextrose 5 % with KCl 20 mEq / L 20 mEq (11/21/17 0612)  . heparin 1,300 Units/hr (11/21/17 0041)  . piperacillin-tazobactam (ZOSYN)  IV 3.375 g (11/21/17 0510)   PRN Meds: acetaminophen **OR** acetaminophen   Vital Signs    Vitals:   11/20/17 1628 11/20/17 1920 11/21/17 0024 11/21/17 0404  BP: 118/83 (!) 124/91 115/73 (!) 125/92  Pulse: 84 84 77 83  Resp: 18 20 18 18   Temp: 98.5 F (36.9 C) 97.6 F (36.4 C) 99.3 F (37.4 C) 97.7 F (36.5 C)  TempSrc: Oral Oral Oral Oral  SpO2: 100% 100% 100% 100%  Weight:    151 lb 3.8 oz (68.6 kg)  Height:        Intake/Output Summary (Last 24 hours) at 11/21/2017 0853 Last data filed at 11/21/2017 0510 Gross per 24 hour  Intake 1744.54 ml  Output 2700 ml  Net -955.46 ml   Filed Weights   11/19/17 0323 11/20/17 0708 11/21/17 0404  Weight: 145 lb 15.1 oz (66.2 kg) 150 lb 5.7 oz (68.2 kg) 151 lb 3.8 oz (68.6 kg)    Physical Exam   General: Elderly, ill-appearing, NAD Skin: Warm, dry, intact  Head: Normocephalic, atraumatic, clear, moist mucus membranes. Neck: Negative for carotid bruits. No JVD Lungs: Clear to ausculation bilaterally. No wheezes, rales, or rhonchi. Breathing is unlabored. Cardiovascular: RRR with S1 S2. No murmurs, rubs, or gallops Abdomen: Soft, non-tender,  non-distended with normoactive bowel sounds.  No obvious abdominal masses. MSK: Strength and tone appear normal for age. 5/5 in all extremities Extremities: No edema. No clubbing or cyanosis. DP/PT pulses 2+ bilaterally Neuro: Oriented to self.  No focal deficits. No facial asymmetry. MAE spontaneously. Psych: Responds to some questions appropriately   Labs    Chemistry Recent Labs  Lab 11/18/17 1520  11/20/17 1524 11/20/17 2242 11/21/17 0625  NA 146*   < > 148* 146* 144  K 4.4   < > 3.3* 3.5 3.3*  CL 109   < > 116* 113* 110  CO2 22   < > 23 25 23   GLUCOSE 367*   < > 230* 238* 289*  BUN 28*   < > 12 13 10   CREATININE 1.06   < > 0.89 0.78 0.80  CALCIUM 8.9   < > 8.4* 8.4* 8.2*  PROT 6.1*  --   --   --   --   ALBUMIN 1.9*  --   --   --   --   AST 33  --   --   --   --   ALT 30  --   --   --   --   ALKPHOS 67  --   --   --   --  BILITOT 0.7  --   --   --   --   GFRNONAA >60   < > >60 >60 >60  GFRAA >60   < > >60 >60 >60  ANIONGAP 15   < > 9 8 11    < > = values in this interval not displayed.     Hematology Recent Labs  Lab 11/19/17 0556 11/20/17 0808 11/21/17 0625  WBC 18.6* 18.0* 17.8*  RBC 3.42* 3.49* 3.48*  HGB 9.4* 9.2* 9.6*  HCT 29.8* 30.5* 30.4*  MCV 87.1 87.4 87.4  MCH 27.5 26.4 27.6  MCHC 31.5 30.2 31.6  RDW 14.8 14.5 14.4  PLT 356 353 305    Cardiac Enzymes Recent Labs  Lab 11/18/17 2007 11/19/17 0041 11/19/17 0556  TROPONINI 2.36* 2.68* 2.36*    Recent Labs  Lab 11/18/17 1539  TROPIPOC 2.62*     BNPNo results for input(s): BNP, PROBNP in the last 168 hours.   DDimer No results for input(s): DDIMER in the last 168 hours.   Radiology    [name] Contrast  Result Date: 11/19/2017 CLINICAL DATA:  Altered mental status.  Spasticity. EXAM: MRI HEAD WITHOUT CONTRAST TECHNIQUE: Multiplanar, multiecho pulse sequences of the brain and surrounding structures were obtained without intravenous contrast. COMPARISON:  CT yesterday.  MRI 10/07/2017.  FINDINGS: Brain: Diffusion imaging does not show any acute or subacute infarction. There are extensive chronic ischemic changes throughout the brainstem. There are old cerebellar infarctions on the right and there is generalized cerebellar atrophy. There are old infarctions within the thalami and basal ganglia. There are moderate chronic small-vessel ischemic changes affecting the deep and subcortical white matter. No large vessel territory infarction. No mass lesion, recent hemorrhage, hydrocephalus or extra-axial collection. Some hemosiderin deposition in the left basal ganglia related to old infarction. Vascular: Major vessels at the base of the brain show flow. There may be slow flow in the posterior circulation. Skull and upper cervical spine: Negative Sinuses/Orbits: Mucosal inflammatory changes of the left maxillary sinus. Orbits negative. Other: None IMPRESSION: No acute finding. Extensive chronic ischemic changes throughout the brain as outlined above. Electronically Signed   By: Paulina FusiMark  Shogry M.D.   On: 11/19/2017 18:53   Mr Cervical Spine Wo Contrast  Result Date: 11/19/2017 CLINICAL DATA:  Worsening mental status and spasticity. EXAM: MRI CERVICAL SPINE WITHOUT CONTRAST TECHNIQUE: Multiplanar, multisequence MR imaging of the cervical spine was performed. No intravenous contrast was administered. COMPARISON:  CT 10/04/2017 FINDINGS: Alignment: Mild curvature convex to the right. 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 No acute bone finding. Cord: No cord compression or primary cord lesion. Posterior Fossa, vertebral arteries, paraspinal tissues: See results of brain MRI. Extensive chronic intracranial ischemic changes. Disc levels: Foramen magnum, C1-2 and C2-3 are widely patent. C3-4: Spondylosis with endplate osteophytes. Facet degeneration on the left. No compressive canal stenosis. Mild left foraminal narrowing. C4-5: Spondylosis with endplate osteophytes and bulging of the disc.  Facet degeneration on the left. No compressive central canal stenosis. Mild left foraminal narrowing. C5-6: Normal interspace. C6-7: Normal interspace. C7-T1: Normal interspace. Upper thoracic region: Negative. IMPRESSION: No significant finding in the cervical region. 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 Mild left foraminal narrowing at those 2 levels. Electronically Signed   By: Paulina FusiMark  Shogry M.D.   On: 11/19/2017 18:57    Telemetry    11/21/17 NSR - Personally Reviewed  ECG  11/18/17 NSR with T wave inversions in leads II, V4-V6- Personally Reviewed  Cardiac Studies   Echocardiogram  11/19/17 - Left ventricle: The cavity size was mildly dilated. Wall thickness was normal. Systolic function was moderately to severely reduced. The estimated ejection fraction was in the range of 30% to 35%. Akinesis of the mid-apicalanteroseptal myocardium.  Patient Profile     62 y.o. male with a hx of CVA 2015 and in 2019, hypertension, hyperlipidemia and diabetes who is being followed by cardiology for the evaluation and management of new systolic heart failure at the request of Nischal Narendra.  Assessment & Plan    1. Non-STEMI:  -Trop, 2.63>2.68>2.36 today  -EKG with LVH and T-wave inversions in leads II, V4-6 -Difficult to determine full range of symptoms given that the patient is minimally verbal (secondary to CVA) with no family at bedside  -Drop in LVEF likely related to cardiac event. Last echo 09/2017 with EF 50-55% down to 30-35% this admission -Continue Hep gtt  -Continue ASA 81 -Given his chronic medical conditions, will likely opt to treat medically for now as he is not a cath candidate at this time.  -Optimize medical therapy for his systolic failure with ACE and BB -Plan for cardiac cath for further ischemic evaluation given elevated troponin and recent drop in LVEF? Will  discuss plan with MD   2. Systolic heart failure with LVEF 30-35%:  -Per echocardiogram on 11/19/17 with LVEF of 30-35% and akinesis of mid-apicalanteroseptal myocardium. Last echo in 09/2017 with EF of 50-55% -Weight, 151lb today, 150lb on admission  -I&O, net positive 1.7L with total output 2.7L yesterday  -Ramipril 2.5mg , metoprolol 25mg >>consider changing to carvedilol  3. DM: -Per primary team  -SSI  4. CVA with encephalopathy: -MRI without acute infarct but likely worsening of chronic vascular disease -Neurology has signed off  -EEG consistent with a mild to moderate generalized nonspecific cerebral dysfunction (encephalopathy).   5. Hypokalemia: -IV maintenance  fluid with 20K+ -Monitor with BMET  -Per primary team   Signed, Georgie Chard NP-C [facility] Pager: [phone] 11/21/2017, 8:53 AM     Patient new to me this am so I don't know his baseline. Encephalopathy improving exam with clear Lungs no murmur trace edema good pulses and sacral RLE skin wounds. Troponin staying elevated on 2 range. My concern is ECG yesterday shows near acute ST elevation MI in precordial leads with J points elevated Echo confirms new RWMAls Suspect he has a lot of myocardium in jeopardy with stunning of LV function. Continue heparin He is going for swallow study now and no family available may consider cath Wendsday if MS continues to improve  [employer]

## 2017-11-21 NOTE — Progress Notes (Signed)
Mr. Luetta NuttingReaves' sister at bedside and consent obtained for Cardiac CAth scheduled for tomorrow.  Mr Luetta NuttingReaves is alert and oriented x 4 and not using his hands so unable to sign consent.  HIs sister, Chyrl CivatteJoAnn, signed consent in his room with his approval.  1st liquids given via teaspoon only and thickened to honey-thick liquid and patient tolerated well.

## 2017-11-22 ENCOUNTER — Inpatient Hospital Stay (HOSPITAL_COMMUNITY): Payer: Medicare (Managed Care)

## 2017-11-22 DIAGNOSIS — Z515 Encounter for palliative care: Secondary | ICD-10-CM

## 2017-11-22 DIAGNOSIS — Z7189 Other specified counseling: Secondary | ICD-10-CM

## 2017-11-22 DIAGNOSIS — L0390 Cellulitis, unspecified: Secondary | ICD-10-CM

## 2017-11-22 DIAGNOSIS — I214 Non-ST elevation (NSTEMI) myocardial infarction: Secondary | ICD-10-CM

## 2017-11-22 DIAGNOSIS — J690 Pneumonitis due to inhalation of food and vomit: Secondary | ICD-10-CM

## 2017-11-22 LAB — BASIC METABOLIC PANEL
Anion gap: 9 (ref 5–15)
BUN: 14 mg/dL (ref 6–20)
CO2: 22 mmol/L (ref 22–32)
Calcium: 8.3 mg/dL — ABNORMAL LOW (ref 8.9–10.3)
Chloride: 111 mmol/L (ref 101–111)
Creatinine, Ser: 0.92 mg/dL (ref 0.61–1.24)
GFR calc Af Amer: >60 mL/min (ref >60)
GLUCOSE: 381 mg/dL — AB (ref 65–99)
POTASSIUM: 4.1 mmol/L (ref 3.5–5.1)
Sodium: 142 mmol/L (ref 135–145)

## 2017-11-22 LAB — GLUCOSE, CAPILLARY
GLUCOSE-CAPILLARY: 292 mg/dL — AB (ref 65–99)
Glucose-Capillary: 319 mg/dL — ABNORMAL HIGH (ref 65–99)
Glucose-Capillary: 216 mg/dL — ABNORMAL HIGH (ref 65–99)
Glucose-Capillary: 219 mg/dL — ABNORMAL HIGH (ref 65–99)
Glucose-Capillary: 163 mg/dL — ABNORMAL HIGH (ref 65–99)

## 2017-11-22 LAB — CBC
HEMATOCRIT: 30.4 % — AB (ref 39.0–52.0)
Hemoglobin: 9.3 g/dL — ABNORMAL LOW (ref 13.0–17.0)
MCH: 26.8 pg (ref 26.0–34.0)
MCHC: 30.6 g/dL (ref 30.0–36.0)
MCV: 87.6 fL (ref 78.0–100.0)
Platelets: 251 10*3/uL (ref 150–400)
RBC: 3.47 MIL/uL — ABNORMAL LOW (ref 4.22–5.81)
RDW: 14.4 % (ref 11.5–15.5)
WBC: 15.3 10*3/uL — ABNORMAL HIGH (ref 4.0–10.5)

## 2017-11-22 LAB — HEPARIN LEVEL (UNFRACTIONATED)
HEPARIN UNFRACTIONATED: 0.19 [IU]/mL — AB (ref 0.30–0.70)
Heparin Unfractionated: 0.28 IU/mL — ABNORMAL LOW (ref 0.30–0.70)
Heparin Unfractionated: 0.2 IU/mL — ABNORMAL LOW (ref 0.30–0.70)

## 2017-11-22 LAB — CK: Total CK: 401 U/L — ABNORMAL HIGH (ref 49–397)

## 2017-11-22 LAB — PROTIME-INR
INR: 1.38
Prothrombin Time: 16.8 seconds — ABNORMAL HIGH (ref 11.4–15.2)

## 2017-11-22 MED ORDER — SODIUM CHLORIDE 0.9% FLUSH: 3.0000 mL | INTRAVENOUS | Status: DC | PRN

## 2017-11-22 MED ORDER — INSULIN ASPART 100 UNIT/ML ~~LOC~~ SOLN
0.0000 [IU] | Freq: Four times a day (QID) | SUBCUTANEOUS | Status: DC
  Administered 2017-11-22: 3 [IU] via SUBCUTANEOUS
  Administered 2017-11-23: 2 [IU] via SUBCUTANEOUS

## 2017-11-22 MED ORDER — SODIUM CHLORIDE 0.9 % IV SOLN: 250.0000 mL | INTRAVENOUS | Status: DC | PRN

## 2017-11-22 MED ORDER — SODIUM CHLORIDE 0.9 % IV SOLN: INTRAVENOUS | Status: DC

## 2017-11-22 MED ORDER — SODIUM CHLORIDE 0.9% FLUSH
3.0000 mL | Freq: Two times a day (BID) | INTRAVENOUS | Status: DC
  Administered 2017-11-22 (×2): 3 mL via INTRAVENOUS

## 2017-11-22 MED ORDER — HEPARIN BOLUS VIA INFUSION
1000.0000 [IU] | Freq: Once | INTRAVENOUS | Status: DC
  Filled 2017-11-22: qty 1000

## 2017-11-22 MED ORDER — LACTATED RINGERS IV SOLN
INTRAVENOUS | Status: DC
  Administered 2017-11-22: 19:00:00 via INTRAVENOUS

## 2017-11-22 NOTE — Progress Notes (Signed)
Subjective: no acute events overnight, patient would not respond to questions or follow commands today.  Objective:  Vital signs in last 24 hours: Vitals:   11/21/17 1950 11/21/17 1956 11/22/17 0458 11/22/17 1001  BP: 90/63  117/79 121/90  Pulse: 88 71 73 89  Resp: 18  20   Temp: 98.6 F (37 C)  98.6 F (37 C) 98.3 F (36.8 C)  TempSrc: Oral  Oral Oral  SpO2: (!) 68% 100% 100% 100%  Weight:      Height:       Physical Exam  Constitutional:  Chronically ill appearing male   Cardiovascular: Regular rhythm. Exam reveals gallop and S4. Exam reveals no friction rub.  No murmur heard. Pulmonary/Chest: Effort normal. No respiratory distress. He has no wheezes.  Coarse breath sounds throughout  Abdominal: He exhibits no distension. There is no tenderness.  Musculoskeletal:     Neurological: He is alert.  Skin: Skin is warm and dry.  Bilateral foot wounds, no palpable pulses.  Pt with sacral decub does not look infected at this time   BMP Latest Ref Rng & Units 11/22/2017 11/21/2017 11/20/2017  Glucose 65 - 99 mg/dL 409(W381(H) 119(J289(H) 478(G238(H)  BUN 6 - 20 mg/dL 14 10 13   Creatinine 0.61 - 1.24 mg/dL 9.560.92 2.130.80 0.860.78  BUN/Creat Ratio 10 - 24 - - -  Sodium 135 - 145 mmol/L 142 144 146(H)  Potassium 3.5 - 5.1 mmol/L 4.1 3.3(L) 3.5  Chloride 101 - 111 mmol/L 111 110 113(H)  CO2 22 - 32 mmol/L 22 23 25   Calcium 8.9 - 10.3 mg/dL 8.3(L) 8.2(L) 8.4(L)   CBC Latest Ref Rng & Units 11/22/2017 11/21/2017 11/20/2017  WBC 4.0 - 10.5 K/uL 15.3(H) 17.8(H) 18.0(H)  Hemoglobin 13.0 - 17.0 g/dL 5.7(Q9.3(L) 4.6(N9.6(L) 6.2(X9.2(L)  Hematocrit 39.0 - 52.0 % 30.4(L) 30.4(L) 30.5(L)  Platelets 150 - 400 K/uL 251 305 353    Assessment/Plan:  Active Problems:   Sepsis (HCC)   Pressure injury of skin  Sepsis 2/2 to pneumonia and UTI Patient was started on vancomycin/Zosyn on admission.  Patient is afebrile with tachycardia currently. Blood pressure stable in the 120s systolic and oxygenating well on room air.Treating for  possible aspiration pneumonia  with Zosyn. D/ced vancomycin as this is not likely a MRSA pneumonia.  White count improving.  - zosyn - blood culture no growth to date - Urine culture no growth  Ischemic cardiomyopathy: NSTEMI with Elevated troponin I 2.3 on admission and remained flat.  On heparin.  Pt likely with evolving anteroseptal  infarct prior to admission.  EKG without acute ischemic changes.  Echo demonstrating EF 30-35%, akinesis of mid apicalanteroseptal myocardium prior ECHO in February EF 50-55% and NRWMA .  - cardiology consulted added metoprolol, pt not able to take pills yesterday - continue heparin - aspirin daily - cath today cancelled due to pts unresponsiveness, possible cath tomorrow, family meeting for goals of care this afternoon  Continuing Neurological decline, H/o multiple CVAs: Pt has a history of multiple CVAs, last in Feb 2019 in the setting of sepsis and has had subsequent decline in function since that time and currently residing in SNF, residual L sided deficits with lower extremity contractures.  Continued decline in neurological status in the past couple of months, worse in the past few weeks, with not a clear explantation will consult neurology for input on patient's case. Pt now with sespis like picture likely 2/2 aspiration pneumonia taxing a brain with poor reserve.    -  on heparin, holding plavix - aspirin daily - treating hypernatremia - appreciate neurology's recommendations and input on this case - decreased baclofen dosage  - MRI without acute infarct but worsening of chronic vascular disease - MRI cervical spine shows only mild foraminal narrowing at C4-6, Venous duplex negative, EEG pending  Hypernatremia: Sodium 149 on 4/6, was 146 on 4/5,  Was 135 a month ago, was started on florinef for blood pressure  -resolved -holding fludricortisone  -will give gentle LR IVF as pt with poor intake  H/o DM On home metformin, SSI novolog. Most recent  A1c 7.7, cbgs still elevated  -lantus 8 units daily -SSI Q6 hours  Multiple Wounds: pt with heel ulcerations that are non healing due to peripheral vascular disease, also with toe and other areas of foot,  sacral decubitus ulcer, shin and scrotal lesions  -ABI's show poor perfusion to bilateral feet -Surgery consulted do not feel there is an active infection from patient's wounds have signed off -wound care consulted appreciate assistance  Dispo: Anticipated discharge pending family discussion  with palliative care for goals of care this afternoon.   Angelita Ingles, MD 11/22/2017, 10:54 AM Thornell Mule MD PGY-1 Internal Medicine Pager # [phone]

## 2017-11-22 NOTE — Progress Notes (Signed)
ANTICOAGULATION CONSULT NOTE  Pharmacy Consult for heparin Indication: chest pain/ACS  Allergies  Allergen Reactions  . Atorvastatin     "leg aches"    Patient Measurements: Height: 6' (182.9 cm) Weight: 151 lb 3.8 oz (68.6 kg) IBW/kg (Calculated) : 77.6 Heparin Dosing Weight: 68.6 kg  Vital Signs: Temp: 98.6 F (37 C) (04/08 1950) Temp Source: Oral (04/08 1950) BP: 90/63 (04/08 1950) Pulse Rate: 71 (04/08 1956)  Labs: Recent Labs    11/19/17 0556  11/20/17 [id number] 11/20/17 1524 11/20/17 2242 11/21/17 0625 11/21/17 1632 11/22/17 0117  HGB 9.4*  --  9.2*  --   --  9.6*  --  9.3*  HCT 29.8*  --  30.5*  --   --  30.4*  --  30.4*  PLT 356  --  353  --   --  305  --  251  LABPROT  --   --   --   --   --   --   --  16.8*  INR  --   --   --   --   --   --   --  1.38  HEPARINUNFRC  --    < > 0.40  --  <0.10* <0.10* <0.10* 0.19*  CREATININE 0.88  --  0.93 0.89 0.78 0.80  --   --   TROPONINI 2.36*  --   --   --   --   --   --   --    < > = values in this interval not displayed.   Assessment: 62 yo male on heparin for r/o ACS.   RN changed IV site yesterday due to persistent undetectable levels. Heparin is now detectable, but still subtherapeutic. CBC ok. Discussed with RN.  Goal of Therapy:  Heparin level 0.3-0.7 units/ml Monitor platelets by anticoagulation protocol: Yes    Plan:  -Increase heparin to 1700 units/hr -Check 6 hour level -Daily heparin level, CBC    Baldemar FridayMasters, Ellington Cornia M  11/22/2017 1:57 AM

## 2017-11-22 NOTE — Progress Notes (Signed)
Internal Medicine Attending:   I saw and examined the patient. I reviewed the resident's note and I agree with the resident's findings and plan as documented in the resident's note.  Patient appears more confused this morning and only open his eyes to his name but did not respond to any questions nor did he follow any commands.  Patient was initially admitted with worsening mental status thought likely to be secondary to an underlying infection most likely pneumonia/UTI.  Blood cultures remain negative to date and urine culture was negative on admission.  Patient was noted to have a left-sided infiltrate and was thought to have possible aspiration pneumonia.  We will continue with IV Zosyn for now.  Leukocytosis slowly improving.  Palliative care consulted for goals of care discussion with family and patient.  Neuro follow-up and recommendations appreciated.  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  Resident discussed with neurology today.  No further workup at this time.  Patient was also noted to have an elevated troponin on admission with some anteroseptal EKG changes and had an echo demonstrating a new wall motion abnormality as well as reduced EF of 30-35% compared to an echo in February which had an EF of 50-55%.  Cardiology follow-up and recommendations appreciated.  Cardiac cath postponed today secondary to patient's unresponsiveness.  We will continue with heparin drip for now as well as aspirin and metoprolol.

## 2017-11-22 NOTE — Progress Notes (Signed)
Physical Therapy Wound Treatment Patient Details  Name: [name] Williams MRN: [id number] Date of Birth: October 16, 1955  Today's Date: 11/22/2017 Time: 3151-7616 Time Calculation (min): 47 min  Subjective  Subjective: none stated Patient and Family Stated Goals: to heal wound  Pain Score:  Pt with 6/10 Facial Pain Score with moving.   Wound Assessment  Pressure Injury 11/18/17 Unstageable - Full thickness tissue loss in which the base of the ulcer is covered by slough (yellow, tan, gray, green or brown) and/or eschar (tan, brown or black) in the wound bed. Sacral  (Active)  Dressing Type ABD;Barrier Film (skin prep);Gauze (Comment);Moist to dry 11/22/2017  5:00 PM  Dressing Changed 11/22/2017  5:00 PM  Dressing Change Frequency Twice a day 11/22/2017  5:00 PM  State of Healing Non-healing 11/22/2017  5:00 PM  Site / Wound Assessment Yellow;Brown 11/22/2017  5:00 PM  % Wound base Yellow/Fibrinous Exudate 100% 11/22/2017  5:00 PM  % Wound base Black/Eschar 75% 11/21/2017 12:45 PM  Peri-wound Assessment Pink;Intact;Erythema (blanchable) 11/22/2017  5:00 PM  Wound Length (cm) 4 cm 11/22/2017  5:00 PM  Wound Width (cm) 8.5 cm 11/22/2017  5:00 PM  Wound Surface Area (cm^2) 34 cm^2 11/22/2017  5:00 PM  Drainage Amount Minimal 11/22/2017  5:00 PM  Drainage Description Serosanguineous 11/22/2017  5:00 PM  Treatment Debridement (Selective);Hydrotherapy (Pulse lavage);Packing (Saline gauze) 11/22/2017  5:00 PM   Santyl applied to necrotic tissue   Hydrotherapy Pulsed lavage therapy - wound location: sacrum Pulsed Lavage with Suction (psi): 4 psi(4-12 psi) Pulsed Lavage with Suction - Normal Saline Used: 1000 mL Pulsed Lavage Tip: Tip with splash shield Selective Debridement Selective Debridement - Location: sacrum Selective Debridement - Tools Used: Scalpel;Forceps Selective Debridement - Tissue Removed: yellow/brown hard slough, remaining hard slough scored for better enzymatic debridement   Wound Assessment and Plan   Wound Therapy - Assess/Plan/Recommendations Wound Therapy - Clinical Statement: Pt has yellow/brown hard slough covering wound bed that was slightly softened with pulsed lavage and minimal amount was able to be debrided. Remaining area scored and Santyl applied to break down cover to be able to reach wound bed. Pt will benefit from continued pulsed lavage and debridement for decreased bioburden and improved healing. Factors Delaying/Impairing Wound Healing: Incontinence;Immobility;Multiple medical problems;Polypharmacy Hydrotherapy Plan: Debridement;Dressing change;Patient/family education;Pulsatile lavage with suction Wound Therapy - Frequency: 6X / week Wound Therapy - Follow Up Recommendations: Skilled nursing facility Wound Plan: see above  Wound Therapy Goals- Improve the function of patient's integumentary system by progressing the wound(s) through the phases of wound healing (inflammation - proliferation - remodeling) by: Decrease Necrotic Tissue to: 70 Decrease Necrotic Tissue - Progress: Goal set today Increase Granulation Tissue to: 30 Increase Granulation Tissue - Progress: Goal set today Goals/treatment plan/discharge plan were made with and agreed upon by patient/family: No, Patient unable to participate in goals/treatment/discharge plan and family unavailable Time For Goal Achievement: 7 days Wound Therapy - Potential for Goals: Fair  Goals will be updated until maximal potential achieved or discharge criteria met.  Discharge criteria: when goals achieved, discharge from hospital, MD decision/surgical intervention, no progress towards goals, refusal/missing three consecutive treatments without notification or medical reason.  GP    Dani Gobble. Migdalia Dk PT, DPT Acute Rehabilitation  [phone] Pager [phone]   [place] 11/22/2017, 5:59 PM

## 2017-11-22 NOTE — Progress Notes (Signed)
ANTICOAGULATION CONSULT NOTE  Pharmacy Consult for heparin Indication: chest pain/ACS  Allergies  Allergen Reactions  . Atorvastatin     "leg aches"    Patient Measurements: Height: 6' (182.9 cm) Weight: 151 lb 3.8 oz (68.6 kg) IBW/kg (Calculated) : 77.6 Heparin Dosing Weight: 68.6 kg  Vital Signs: Temp: 98.9 F (37.2 C) (04/09 2004) Temp Source: Oral (04/09 2004) BP: 108/69 (04/09 2004) Pulse Rate: 72 (04/09 2004)  Labs: Recent Labs    11/20/17 [id number]  11/20/17 2242 11/21/17 [id number]  11/22/17 0117 11/22/17 0739 11/22/17 1812  HGB 9.2*  --   --  9.6*  --  9.3*  --   --   HCT 30.5*  --   --  30.4*  --  30.4*  --   --   PLT 353  --   --  305  --  251  --   --   LABPROT  --   --   --   --   --  16.8*  --   --   INR  --   --   --   --   --  1.38  --   --   HEPARINUNFRC 0.40  --  <0.10* <0.10*   < > 0.19* 0.28* 0.20*  CREATININE 0.93   < > 0.78 0.80  --  0.92  --   --   CKTOTAL  --   --   --   --   --  401*  --   --    < > = values in this interval not displayed.   Assessment: 62 yo male on heparin for r/o ACS.  Heparin level was 0.28 this am and rate was increased. Heparin level now 0.2 units/hr  RN states pump has been beeping so she will change IV site.   Goal of Therapy:  Heparin level 0.3-0.7 units/ml Monitor platelets by anticoagulation protocol: Yes   Plan:  -Continue heparin at 1800 units/hr -Daily heparin level, CBC  Seynabou Fults P[place]et  11/22/2017 8:25 PM

## 2017-11-22 NOTE — Progress Notes (Signed)
Progress Note  Patient Name: [name] Williams Date of Encounter: 11/22/2017  Primary Cardiologist: Dr. Eden Emms   Subjective   Poorly responsive Sitting in pile of stool .   Inpatient Medications    Scheduled Meds: . [START ON 11/23/2017] aspirin  81 mg Oral Daily  . baclofen  10 mg Oral QID  . collagenase   Topical Daily  . collagenase   Topical Daily  . donepezil  10 mg Oral QHS  . insulin aspart  0-15 Units Subcutaneous TID WC  . insulin aspart  3 Units Subcutaneous TID WC  . insulin glargine  8 Units Subcutaneous QHS  . metoprolol tartrate  25 mg Oral BID  . multivitamin with minerals  1 tablet Oral Daily  . potassium chloride  40 mEq Oral Daily  . ramipril  2.5 mg Oral Daily  . sodium chloride flush  3 mL Intravenous Q12H   Continuous Infusions: . sodium chloride    . sodium chloride    . dextrose 5 % with KCl 20 mEq / L 20 mEq (11/21/17 2256)  . heparin 1,700 Units/hr (11/22/17 0201)  . piperacillin-tazobactam (ZOSYN)  IV 3.375 g (11/22/17 0730)   PRN Meds: sodium chloride, acetaminophen **OR** acetaminophen, RESOURCE THICKENUP CLEAR, sodium chloride flush   Vital Signs    Vitals:   11/21/17 1434 11/21/17 1950 11/21/17 1956 11/22/17 0458  BP: 98/73 90/63  117/79  Pulse: 73 88 71 73  Resp: 18 18  20   Temp: 98.5 F (36.9 C) 98.6 F (37 C)  98.6 F (37 C)  TempSrc: Oral Oral  Oral  SpO2: 100% (!) 68% 100% 100%  Weight:      Height:        Intake/Output Summary (Last 24 hours) at 11/22/2017 0804 Last data filed at 11/22/2017 0612 Gross per 24 hour  Intake 1690.03 ml  Output 1050 ml  Net 640.03 ml   Filed Weights   11/19/17 0323 11/20/17 0708 11/21/17 0404  Weight: 145 lb 15.1 oz (66.2 kg) 150 lb 5.7 oz (68.2 kg) 151 lb 3.8 oz (68.6 kg)    Physical Exam   General: Elderly, ill-appearing, NAD Poorly responsive  Skin: Sacral decubitus Foley  Head: Normocephalic, atraumatic, clear, moist mucus membranes. Neck: Negative for carotid bruits. No  JVD Lungs: Clear to ausculation bilaterally. No wheezes, rales, or rhonchi. Breathing is unlabored. Cardiovascular: RRR with S1 S2. No murmurs, rubs, or gallops Abdomen: Soft, non-tender, non-distended with normoactive bowel sounds.  No obvious abdominal masses. MSK: Strength and tone appear normal for age. 5/5 in all extremities Extremities: No edema. No clubbing or cyanosis. DP/PT pulses 2+ bilaterally Neuro: Oriented to self.  No focal deficits. No facial asymmetry. MAE spontaneously. Psych: Responds to some questions appropriately   Labs    Chemistry Recent Labs  Lab 11/18/17 1520  11/20/17 2242 11/21/17 0625 11/22/17 0117  NA 146*   < > 146* 144 142  K 4.4   < > 3.5 3.3* 4.1  CL 109   < > 113* 110 111  CO2 22   < > 25 23 22   GLUCOSE 367*   < > 238* 289* 381*  BUN 28*   < > 13 10 14   CREATININE 1.06   < > 0.78 0.80 0.92  CALCIUM 8.9   < > 8.4* 8.2* 8.3*  PROT 6.1*  --   --   --   --   ALBUMIN 1.9*  --   --   --   --  AST 33  --   --   --   --   ALT 30  --   --   --   --   ALKPHOS 67  --   --   --   --   BILITOT 0.7  --   --   --   --   GFRNONAA >60   < > >60 >60 >60  GFRAA >60   < > >60 >60 >60  ANIONGAP 15   < > 8 11 9    < > = values in this interval not displayed.     Hematology Recent Labs  Lab 11/20/17 0808 11/21/17 0625 11/22/17 0117  WBC 18.0* 17.8* 15.3*  RBC 3.49* 3.48* 3.47*  HGB 9.2* 9.6* 9.3*  HCT 30.5* 30.4* 30.4*  MCV 87.4 87.4 87.6  MCH 26.4 27.6 26.8  MCHC 30.2 31.6 30.6  RDW 14.5 14.4 14.4  PLT 353 305 251    Cardiac Enzymes Recent Labs  Lab 11/18/17 2007 11/19/17 0041 11/19/17 0556  TROPONINI 2.36* 2.68* 2.36*    Recent Labs  Lab 11/18/17 1539  TROPIPOC 2.62*     BNPNo results for input(s): BNP, PROBNP in the last 168 hours.   DDimer No results for input(s): DDIMER in the last 168 hours.   Radiology    Dg Swallowing Func-speech Pathology  Result Date: 11/21/2017 Objective Swallowing Evaluation: Type of Study:  MBS-Modified Barium Swallow Study  Patient Details Name: [name] Williams MRN: [id number] Date of Birth: December 14, 1955 Today's Date: 11/21/2017 Time: SLP Start Time (ACUTE ONLY): 1029 -SLP Stop Time (ACUTE ONLY): 1044 SLP Time Calculation (min) (ACUTE ONLY): 15 min Past Medical History: Past Medical History: Diagnosis Date . Acute pyelonephritis  . Altered mental status, unspecified 10/04/2017 . 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 Bacteremia  . 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 Cerebral thrombosis with cerebral infarction 10/08/2017 . Chronic diastolic heart failure (HCC) 11/09/2017 . Chronic systolic congestive heart failure (HCC)  . CVA (cerebral infarction) 06/26/2014 . Diabetes mellitus  . Diabetes mellitus (HCC)  . Diplopia  . Dizziness  . DYSLIPIDEMIA 04/08/2008  Qualifier: Diagnosis of  By: Sondra Barges MD, Zac   . Elevated troponin  . Fever  . History of stroke  . HLD (hyperlipidemia) 04/08/2008  Centricity Description: DIABETES-TYPE 2 Qualifier: Diagnosis of  By: Sondra Barges MD, Zac   Centricity Description: DIABETES MELLITUS, TYPE II Qualifier: Diagnosis of  By: Sondra Barges MD, Zac   . Hypertension  . Hypertensive heart disease with heart failure (HCC)  . 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 Intracranial vascular stenosis 06/23/2014 . Leukocytosis  . Preventative health care 08/29/2013 . Schizophrenia (HCC) 10/31/2014 . Sensory deficit, left 06/26/2014 . Sepsis (HCC)  . Stroke (HCC)  . Stroke-like symptom 06/23/2014 . TOBACCO ABUSE 04/08/2008  Qualifier: Diagnosis of  By: Sondra Barges MD, Zac   Past Surgical History: Past Surgical History: Procedure Laterality Date . TEE WITHOUT CARDIOVERSION N/A 10/10/2017  Procedure: TRANSESOPHAGEAL ECHOCARDIOGRAM (TEE);  Surgeon: Lewayne Bunting, MD;  Location: [facility] ENDOSCOPY;  Service: Cardiovascular;  Laterality: N/A; HPI: Patient is 62 year old male with past medical history of CVA, hypertension, hyperlipidemia, diabetes, schizophrenia who presented to the ED from SNF with worsening altered mental  status and fever. CXR shows left lower lobe haziness consistent with atelectasis or infiltrate. Per chart admitted February 2019 for similar presentation (altered mental status,  fever and found to be bacteremic from urinary source). Pt evaluated by ST 09/17/17 with recommendation for regular diet, thin liquids, no further follow-up. Pt had prior brainstem and cerebellar  CVA (2011, 2015) with no reported swallowing deficits. MBS recommended after po trial.  Subjective: Pt in bed, lethargic Assessment / Plan / Recommendation CHL IP CLINICAL IMPRESSIONS 11/21/2017 Clinical Impression Moderate-severely decreased lingual manipulation/propulsion, cohesion leading to delayed transit and intermittent lingual residue. Sensory deficits likely in combination with cognitive impairments resulted in swallow initiation at the pyriform sinuses for majority of swallows. Required multiple verbal and tactile cues to propel bolus and initiated swallow with barium sitting in valleculae or pyriform sinuses. Laryngeal penetration to vocal cords with nectar consistency before laryngeal protection initiated and aspirated during the study without sensation and unable to throat clear/cough when cued. High aspiration risk with honey thick via cup due to delayed swallow initiation and moderate valleculae and pyriform sinsus residue although teaspoon amounts consistently did not enter vestibule and no appreciable residue with tsp honey and puree. Recommend honey thick liquids via TSP only and puree, check oral cavity at end of meals, ensure pt swallowed before subsequent bites, full assist/supervision and crush pills.  SLP Visit Diagnosis Dysphagia, oropharyngeal phase (R13.12) Attention and concentration deficit following -- Frontal lobe and executive function deficit following -- Impact on safety and function Moderate aspiration risk;Severe aspiration risk   CHL IP TREATMENT RECOMMENDATION 11/21/2017 Treatment Recommendations Therapy as outlined in  treatment plan below   Prognosis 11/21/2017 Prognosis for Safe Diet Advancement Good Barriers to Reach Goals Cognitive deficits Barriers/Prognosis Comment -- CHL IP DIET RECOMMENDATION 11/21/2017 SLP Diet Recommendations Honey thick liquids;Dysphagia 1 (Puree) solids Liquid Administration via Spoon Medication Administration Crushed with puree Compensations Minimize environmental distractions;Slow rate;Small sips/bites;Lingual sweep for clearance of pocketing Postural Changes Seated upright at 90 degrees   CHL IP OTHER RECOMMENDATIONS 11/21/2017 Recommended Consults -- Oral Care Recommendations Oral care BID Other Recommendations --   CHL IP FOLLOW UP RECOMMENDATIONS 11/21/2017 Follow up Recommendations 24 hour supervision/assistance   CHL IP FREQUENCY AND DURATION 11/21/2017 Speech Therapy Frequency (ACUTE ONLY) min 2x/week Treatment Duration 2 weeks      CHL IP ORAL PHASE 11/21/2017 Oral Phase Impaired Oral - Pudding Teaspoon -- Oral - Pudding Cup -- Oral - Honey Teaspoon Delayed oral transit;Decreased bolus cohesion;Reduced posterior propulsion Oral - Honey Cup Delayed oral transit;Decreased bolus cohesion;Reduced posterior propulsion Oral - Nectar Teaspoon Delayed oral transit;Decreased bolus cohesion;Reduced posterior propulsion Oral - Nectar Cup -- Oral - Nectar Straw -- Oral - Thin Teaspoon -- Oral - Thin Cup -- Oral - Thin Straw -- Oral - Puree Delayed oral transit;Decreased bolus cohesion;Lingual/palatal residue;Reduced posterior propulsion Oral - Mech Soft -- Oral - Regular -- Oral - Multi-Consistency -- Oral - Pill -- Oral Phase - Comment --  CHL IP PHARYNGEAL PHASE 11/21/2017 Pharyngeal Phase Impaired Pharyngeal- Pudding Teaspoon -- Pharyngeal -- Pharyngeal- Pudding Cup -- Pharyngeal -- Pharyngeal- Honey Teaspoon Delayed swallow initiation-pyriform sinuses Pharyngeal -- Pharyngeal- Honey Cup Pharyngeal residue - valleculae;Pharyngeal residue - pyriform;Reduced epiglottic inversion;Delayed swallow initiation-pyriform  sinuses Pharyngeal -- Pharyngeal- Nectar Teaspoon Delayed swallow initiation-pyriform sinuses;Penetration/Aspiration before swallow Pharyngeal Material enters airway, passes BELOW cords without attempt by patient to eject out (silent aspiration) Pharyngeal- Nectar Cup -- Pharyngeal -- Pharyngeal- Nectar Straw -- Pharyngeal -- Pharyngeal- Thin Teaspoon -- Pharyngeal -- Pharyngeal- Thin Cup -- Pharyngeal -- Pharyngeal- Thin Straw -- Pharyngeal -- Pharyngeal- Puree Delayed swallow initiation-pyriform sinuses Pharyngeal -- Pharyngeal- Mechanical Soft -- Pharyngeal -- Pharyngeal- Regular -- Pharyngeal -- Pharyngeal- Multi-consistency -- Pharyngeal -- Pharyngeal- Pill -- Pharyngeal -- Pharyngeal Comment --  CHL IP CERVICAL ESOPHAGEAL PHASE 11/21/2017 Cervical Esophageal Phase WFL Pudding Teaspoon -- Pudding Cup --  Honey Teaspoon -- Honey Cup -- Nectar Teaspoon -- Nectar Cup -- Nectar Straw -- Thin Teaspoon -- Thin Cup -- Thin Straw -- Puree -- Mechanical Soft -- Regular -- Multi-consistency -- Pill -- Cervical Esophageal Comment -- No flowsheet data found. [name], Lisa Willis 11/21/2017, 12:11 PM Breck CoonsLisa Willis Lonell FaceLitaker M.Ed CCC-SLP Pager [phone]               Telemetry    11/21/17 NSR - Personally Reviewed  ECG    11/18/17 NSR with T wave inversions in leads II, V4-V6- Personally Reviewed  Cardiac Studies   Echocardiogram  11/19/17 - Left ventricle: The cavity size was mildly dilated. Wall thickness was normal. Systolic function was moderately to severely reduced. The estimated ejection fraction was in the range of 30% to 35%. Akinesis of the mid-apicalanteroseptal myocardium.  Patient Profile     62 y.o. male with a hx of CVA 2015 and in 2019, hypertension, hyperlipidemia and diabetes who is being followed by cardiology for the evaluation and management of new systolic heart failure at the request of Nischal Narendra.  Assessment & Plan    1. Non-STEMI:  -Trop, 2.63>2.68>2.36   ECG suggests  anterior ischemia with J point elevation and new RWMA on echo Family signed consent for cath yesterday but he is not responsive enough to Cath today Further w/u change in MS per primary service Not a candidate for Cath today will tentatively schedule for tomorrow continue heparin   2. Systolic heart failure with LVEF 30-35%:  -Per echocardiogram on 11/19/17 with LVEF of 30-35% and akinesis of mid-apicalanteroseptal myocardium. Last echo in 09/2017 with EF of 50-55% no respiratory distress continue beta blocker And ACE    3. DM: -Per primary team  -SSI  4. CVA with encephalopathy: -MRI without acute infarct but likely worsening of chronic vascular disease -Neurology has signed off  -EEG consistent with a mild to moderate generalized nonspecific cerebral dysfunction (encephalopathy).   Depending on his clinical status may not end of cathing as he is looking more like palliative Care type patient. Discussed cleaning up bed/stool with nursing staff as this will not help His decubitus ulcer  Charlton HawsPeter Mady Oubre

## 2017-11-22 NOTE — Progress Notes (Addendum)
Called regarding second opinion for the patient's neurological decline. Discussed with the patient's PCP Dr. Modena JanskyKohler over the telephone.   Imaging review reveals progressive atrophy of the patient's pons since 2015, in conjunction with progressive ischemic changes involving the pons as well, with multiple lacunar infarctions. The findings are seen in conjunction with diffuse cerebellar atrophy and some degree of midbrain atrophy as well. Atrophy of the cerebral hemispheres is also present, but appears most likely to be age related, in contrast to the infratentorial progressive atrophic changes, which appear most likely to be due to a progressive neurodegenerative process. The pontine atrophy and ischemic changes are rated as severe and most likely account for the patient's progressive spasticity. The increasing rapidity of the patient's motor decline described by Dr. Modena JanskyKohler would be in keeping with the end stages of a neurodegenerative process, which in the later stages often presents with more rapidly progressive and new neurological findings. A work up for the subtype of degenerative process could be undertaken, but often these are inconclusive and the process as seen on MRI appears irreversible.    Electronically signed: Dr. Caryl PinaEric Loreli Debruler

## 2017-11-22 NOTE — Consult Note (Signed)
Consultation Note Date: 11/22/2017   Patient Name: [name] Williams  DOB: 1955-08-18  MRN: [id number]  Age / Sex: 62 y.o., male  PCP: Odella Aquas, NP Referring Physician: Aldine Contes, MD  Reason for Consultation: Establishing goals of care and Psychosocial/spiritual support  HPI/Patient Profile: 62 y.o. male  with past medical history of multiple CVAs, schizophrenia, DM, PVD, and recent bacteremia  who was admitted on 11/18/2017 with sepsis, altered mental status, aspiration pneumonia, and NSTEMI.  He was found to have new systolic heart failure with a significant drop in his LVEF (50% -> 30%) since February 2019.  Additionally his albumin has also dropped precipitously from 3.3 in February to 1.9 this admission.  He has been followed by Cardiology who has been unable to move forward with cardiac catheterization due to his waxing and waning mental status.  Bedside nursing is concerned that the patient is not accepting nutrition.  Clinical Assessment and Goals of Care:  I have reviewed medical records including EPIC notes, labs and imaging, assessed the patient and then met at the bedside along with his sister (primary care taker, [name]) and his brother [name] to discuss diagnosis prognosis, [place], EOL wishes, disposition and options.  I introduced Palliative Medicine as specialized medical care for people living with serious illness. It focuses on providing relief from the symptoms and stress of a serious illness. The goal is to improve quality of life for both the patient and the family.  We discussed a brief life review of the patient. He has been disabled most of his life due to schizophrenia.  He lived with his sister for 35+ years.  He has never married or had children.  He is not a particularly religious person.  He has a wonderful sweet disposition.  As far as functional and nutritional status for the  last two months he has had a decreased interest in eating.  His sister has been very concerned about his lack of intake. Given his wounds and weakened state he is unable to walk and likely bed bound.  His mental status in the hospital tends to wax and wane.  Advanced directives, concepts specific to code status, artifical feeding and hydration, and rehospitalization were each considered and discussed. The difference between aggressive medical intervention and comfort care was considered in light of the patient's goals of care. The family seemed divided.  [name] (the primary care taker) was inclined towards a comfort care approach (quality over quantity).  [name], the patient's brother seemed to favor a more aggressive approach and wanted to speak with the rest of the family before making any decisions.  The patient utilizes [facility] services.  We discussed receiving hospice type care thru [facility].  We further discussed having another family meeting that includes [facility].  Questions and concerns were addressed.  Hard Choices booklet left for review. The family was encouraged to call with questions or concerns.   After the meeting I spoke with Dr. Jake Bathe on the phone.  He has discussed the case with Dr. Cheral Marker  as well.  After talking with Dr. Cheral Marker he is inclined towards a comfort care approach as Mr. Burkett mental status changes are likely not reversible.  Dr. Jake Bathe is meeting with [name] in the morning.   Primary Decision Maker:  Next of Kin, [name] Williams with whom the patient lives.    SUMMARY OF RECOMMENDATIONS     Family is going to deliberate over code status and aggressive vs comfort measures.  PMT recommends a follow up meeting with the family that potentially includes [facility].     PACE supports the medical teams stance that the patient would be best served with a comfort approach.  Code Status/Advance Care Planning:  Full   Palliative Prophylaxis:   Aspiration - family desires for patient to  be carefully fed.  We discussed the risk of aspiration.  Prognosis:  Weeks to months depending on goals of care.  (Quality vs Quantity)  Patient is at high risk for an acute event that could take his life given recurrent infections, multiple wounds, recent NSTEMI, currently aspirating.   Discharge Planning: To Be Determined      Primary Diagnoses: Present on Admission: . Sepsis ([place])   I have reviewed the medical record, interviewed the patient and family, and examined the patient. The following aspects are pertinent.  Past Medical History:  Diagnosis Date  . Acute pyelonephritis   . Altered mental status, unspecified 10/04/2017  . 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 Bacteremia   . Brainstem infarction ([place]) 06/26/2014  . Cerebral thrombosis with cerebral infarction 10/08/2017  . Chronic diastolic heart failure ([place]) 11/09/2017  . Chronic systolic congestive heart failure ([place])   . CVA (cerebral infarction) 06/26/2014  . Diabetes mellitus   . Diabetes mellitus ([place])   . Diplopia   . Dizziness   . DYSLIPIDEMIA 04/08/2008   Qualifier: Diagnosis of  By: Trinna Post MD, Zac    . Elevated troponin   . Fever   . History of stroke   . HLD (hyperlipidemia) 04/08/2008   Centricity Description: DIABETES-TYPE 2 Qualifier: Diagnosis of  By: Trinna Post MD, Zac   Centricity Description: DIABETES MELLITUS, TYPE II Qualifier: Diagnosis of  By: Trinna Post MD, Zac    . Hypertension   . Hypertensive heart disease with heart failure ([place])   . 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 Intracranial vascular stenosis 06/23/2014  . Leukocytosis   . Preventative health care 08/29/2013  . Schizophrenia ([place]) 10/31/2014  . Sensory deficit, left 06/26/2014  . Sepsis ([place])   . Stroke ([place])   . Stroke-like symptom 06/23/2014  . TOBACCO ABUSE 04/08/2008   Qualifier: Diagnosis of  By: Trinna Post MD, Zac     Social History   Socioeconomic History  . Marital status: Legally Separated    Spouse name: Not on file  .  Number of children: 0  . Years of education: 12th  . Highest education level: Not on file  Occupational History  . Not on file  Social Needs  . Financial resource strain: Not on file  . Food insecurity:    Worry: Not on file    Inability: Not on file  . Transportation needs:    Medical: Not on file    Non-medical: Not on file  Tobacco Use  . Smoking status: Current Every Day Smoker    Packs/day: 1.00    Years: 10.00    Pack years: 10.00    Types: Cigarettes  . Smokeless tobacco: Never Used  Substance and Sexual Activity  . Alcohol use: No  .  Drug use: Not Currently    Comment: marijuana occassionally   . Sexual activity: Never  Lifestyle  . Physical activity:    Days per week: Patient refused    Minutes per session: Patient refused  . Stress: Not on file  Relationships  . Social connections:    Talks on phone: Patient refused    Gets together: Patient refused    Attends religious service: Patient refused    Active member of club or organization: Patient refused    Attends meetings of clubs or organizations: Patient refused    Relationship status: Patient refused  Other Topics Concern  . Not on file  Social History Narrative   Does regular exercise.   Has never worked, sister takes care of him   Family History  Problem Relation Age of Onset  . Hypertension Mother   . Heart attack Mother        deceased from MI in 35s  . Hypertension Father   . Coronary artery disease Maternal Aunt   . Diabetes Maternal Aunt   . Coronary artery disease Maternal Uncle   . Diabetes Maternal Uncle   . Diabetes Paternal Aunt   . Coronary artery disease Paternal Aunt   . Diabetes Paternal Uncle   . Coronary artery disease Paternal Uncle   . Heart attack Sister        deceased from MI in 36s   Scheduled Meds: . [START ON 11/23/2017] aspirin  81 mg Oral Daily  . baclofen  10 mg Oral QID  . collagenase   Topical Daily  . collagenase   Topical Daily  . donepezil  10 mg Oral QHS    . insulin aspart  0-15 Units Subcutaneous TID WC  . insulin aspart  3 Units Subcutaneous TID WC  . insulin glargine  8 Units Subcutaneous QHS  . metoprolol tartrate  25 mg Oral BID  . multivitamin with minerals  1 tablet Oral Daily  . ramipril  2.5 mg Oral Daily  . sodium chloride flush  3 mL Intravenous Q12H   Continuous Infusions: . sodium chloride    . sodium chloride    . heparin 1,800 Units/hr (11/22/17 1016)  . piperacillin-tazobactam (ZOSYN)  IV 3.375 g (11/22/17 0730)   PRN Meds:.sodium chloride, acetaminophen **OR** acetaminophen, RESOURCE THICKENUP CLEAR, sodium chloride flush Allergies  Allergen Reactions  . Atorvastatin     "leg aches"   Review of Systems patient confused. Physical Exam  Thin well developed chronically ill appearing male, awake, alert, confused CV rrr resp no distress on 3L [place] Abdomen thin, nt, nd Lower extremities with multiple wounds/lesions.  ? Gangrenous fifth digit  Vital Signs: BP 121/90   Pulse 89   Temp 98.3 F (36.8 C) (Oral)   Resp 20   Ht 6' (1.829 m)   Wt 68.6 kg (151 lb 3.8 oz)   SpO2 100%   BMI 20.51 kg/m  Pain Scale: 0-10   Pain Score: Asleep   SpO2: SpO2: 100 % O2 Device:SpO2: 100 % O2 Flow Rate: .O2 Flow Rate (L/min): 3 L/min  IO: Intake/output summary:   Intake/Output Summary (Last 24 hours) at 11/22/2017 1327 Last data filed at 11/22/2017 0700 Gross per 24 hour  Intake 1568.71 ml  Output 701 ml  Net 867.71 ml    LBM: Last BM Date: 11/22/17 Baseline Weight: Weight: 68 kg (150 lb) Most recent weight: Weight: 68.6 kg (151 lb 3.8 oz)     Palliative Assessment/Data: 20%   Flowsheet Rows  Most Recent Value  Intake Tab  Referral Department  -- [internal medicine]  Unit at Time of Referral  Cardiac/Telemetry Unit  Palliative Care Primary Diagnosis  Neurology  Date Notified  11/21/17  Palliative Care Type  New Palliative care  Reason for referral  Clarify Goals of Care  Date of Admission  11/18/17  # of  days IP prior to Palliative referral  3  Clinical Assessment  Psychosocial & Spiritual Assessment  Palliative Care Outcomes      Time In: 3:15 pm Time Out: 4:30 pm Time Total: 75 min. Greater than 50%  of this time was spent counseling and coordinating care related to the above assessment and plan.  Signed by: Florentina Jenny, PA-C Palliative Medicine Pager: [phone]  Please contact Palliative Medicine Team phone at [phone] for questions and concerns.  For individual provider: See Shea Evans

## 2017-11-22 NOTE — Progress Notes (Signed)
Called MD regarding diet change from NPO Patient will pocket his food.  Per MD- will place on Dsy I again and will talk to palliative regarding nutrition (depending on how patient does today with meal).   Tech fed patient 10-15% of meal then he started pocketing it again.   Patient started coughing - Nurse orally suction patient.  Medication have been given with small amounts of pudding .  Must be very diligent about check mouth with each bite.   Had to suction in between bites of medication early in day.

## 2017-11-22 NOTE — Progress Notes (Signed)
PMT meeting with family today at 3:30 pm.  [name] RichardsMarianne Abdinasir Spadafore, PA-C Palliative Medicine Pager: [phone]   No charge note.

## 2017-11-22 NOTE — Progress Notes (Signed)
  Speech Language Pathology Treatment: Dysphagia  Patient Details Name: [name] Williams MRN: [id number] DOB: October 28, 1955 Today's Date: 11/22/2017 Time: 1214-1229 SLP Time Calculation (min) (ACUTE ONLY): 15 min  Assessment / Plan / Recommendation Clinical Impression  Pt more lethargic today. Oral holding present per his usual past several days facilitated transit with dry spoon presentations. Delayed cough x 1. Puree and honey thick liquids via spoon was the safest option following yesterday's MBS knowing aspiration cannot be prevented. Given his overall condition, MBS results he may be experiencing penetration and/or intermittent aspiration. Noted Palliative care meeting for this afternoon. Continue current recommendations and will follow.    HPI HPI: Patient is 62 year old male with past medical history of CVA, hypertension, hyperlipidemia, diabetes, schizophrenia who presented to the ED from SNF with worsening altered mental status and fever. CXR shows left lower lobe haziness consistent with atelectasis or infiltrate. Per chart admitted February 2019 for similar presentation (altered mental status,  fever and found to be bacteremic from urinary source). Pt evaluated by ST 09/17/17 with recommendation for regular diet, thin liquids, no further follow-up. Pt had prior brainstem and cerebellar CVA (2011, 2015) with no reported swallowing deficits. MBS recommended after po trial.       SLP Plan  Continue with current plan of care       Recommendations  Diet recommendations: Dysphagia 1 (puree);Honey-thick liquid Liquids provided via: Teaspoon Medication Administration: Crushed with puree Supervision: Full supervision/cueing for compensatory strategies;Staff to assist with self feeding Compensations: Minimize environmental distractions;Slow rate;Small sips/bites;Lingual sweep for clearance of pocketing Postural Changes and/or Swallow Maneuvers: Seated upright 90 degrees                Oral  Care Recommendations: Oral care QID Follow up Recommendations: 24 hour supervision/assistance SLP Visit Diagnosis: Dysphagia, oropharyngeal phase (R13.12) Plan: Continue with current plan of care       GO                [name], Shana Zavaleta Willis 11/22/2017, [name]

## 2017-11-22 NOTE — Discharge Summary (Signed)
Name: [name] Williams MRN: [id number] DOB: 30-Oct-1955 62 y.o. PCP: [name] Sable, NP  Date of Admission: 11/18/2017  2:51 PM Date of Discharge: 11/23/17 Attending Physician: Earl Lagos, MD  Discharge Diagnosis: 1.  Active Problems:   Sepsis (HCC)   Pressure injury of skin   NSTEMI (non-ST elevated myocardial infarction) (HCC)   Aspiration pneumonia [facility])   Palliative care encounter   Goals of care, counseling/discussion   Discharge Medications: Allergies as of 11/23/2017      Reactions   Atorvastatin    "leg aches"      Medication List    STOP taking these medications   amLODipine 5 MG tablet Commonly known as:  NORVASC   aspirin 325 MG EC tablet   atorvastatin 80 MG tablet Commonly known as:  LIPITOR   cholecalciferol 1000 units tablet Commonly known as:  VITAMIN D   clopidogrel 75 MG tablet Commonly known as:  PLAVIX   donepezil 10 MG tablet Commonly known as:  ARICEPT   feeding supplement (PRO-STAT SUGAR FREE 64) Liqd   fludrocortisone 0.1 MG tablet Commonly known as:  FLORINEF   insulin aspart 100 UNIT/ML injection Commonly known as:  novoLOG   lisinopril 20 MG tablet Commonly known as:  PRINIVIL,ZESTRIL   metFORMIN 1000 MG tablet Commonly known as:  GLUCOPHAGE   polyethylene glycol packet Commonly known as:  MIRALAX / GLYCOLAX     TAKE these medications   acetaminophen 325 MG tablet Commonly known as:  TYLENOL Take 2 tablets (650 mg total) by mouth every 6 (six) hours as needed for mild pain (or Fever >/= 101). What changed:    when to take this  reasons to take this   antiseptic oral rinse Liqd Apply 15 mLs topically as needed for dry mouth.   baclofen 10 MG tablet Commonly known as:  LIORESAL Take 1 tablet (10 mg total) by mouth 4 (four) times daily. What changed:    medication strength  how much to take   collagenase ointment Commonly known as:  SANTYL Apply topically daily.   collagenase ointment Commonly  known as:  SANTYL Apply topically daily.   glycopyrrolate 1 MG tablet Commonly known as:  ROBINUL Take 1 tablet (1 mg total) by mouth every 4 (four) hours as needed (excessive secretions).   glycopyrrolate 0.2 MG/ML injection Commonly known as:  ROBINUL Inject 1 mL (0.2 mg total) into the skin every 4 (four) hours as needed (excessive secretions).   haloperidol 0.5 MG tablet Commonly known as:  HALDOL Take 1 tablet (0.5 mg total) by mouth every 4 (four) hours as needed for agitation (or delirium).   haloperidol 2 MG/ML solution Commonly known as:  HALDOL Place 0.5 mLs (1 mg total) under the tongue every 4 (four) hours as needed for agitation (or delirium).   haloperidol lactate 5 MG/ML injection Commonly known as:  HALDOL Inject 0.1 mLs (0.5 mg total) into the vein every 4 (four) hours as needed (or delirium).   LORazepam 1 MG tablet Commonly known as:  ATIVAN Take 1 tablet (1 mg total) by mouth every 4 (four) hours as needed for anxiety.   LORazepam 2 MG/ML concentrated solution Commonly known as:  ATIVAN Place 0.5 mLs (1 mg total) under the tongue every 4 (four) hours as needed for anxiety.   LORazepam 2 MG/ML injection Commonly known as:  ATIVAN Inject 0.5 mLs (1 mg total) into the vein every 4 (four) hours as needed for anxiety.   metoprolol tartrate 25 MG tablet  Commonly known as:  LOPRESSOR Take 1 tablet (25 mg total) by mouth 2 (two) times daily.   morphine CONCENTRATE 10 MG/0.5ML Soln concentrated solution Take 0.25 mLs (5 mg total) by mouth every 2 (two) hours as needed for moderate pain (or dyspnea).   ondansetron 4 MG disintegrating tablet Commonly known as:  ZOFRAN-ODT Take 1 tablet (4 mg total) by mouth every 6 (six) hours as needed for nausea.   ondansetron 4 MG/2ML Soln injection Commonly known as:  ZOFRAN Inject 2 mLs (4 mg total) into the vein every 6 (six) hours as needed for nausea.   polyvinyl alcohol 1.4 % ophthalmic solution Commonly known as:   LIQUIFILM TEARS Place 1 drop into both eyes 4 (four) times daily as needed for dry eyes.       Disposition and follow-up:   [name] was discharged from [facility] in Serious condition.  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  His chest x-ray and physical exam was consistent with this presentation and he was appropriately started on antibiotics vancomycin and Zosyn.  He was subsequently narrowed to just Zosyn.  His aspiration pneumonia seem to improve.  The other reason he was admitted to further evaluate the patient's neurodegeneration he has had waxing and waning responsiveness.  His PCP was concerned for undiagnosed underlying process.  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  Is which controlled alertness and the patient's ability to respond process information.  Additionally he was found to have multiple lesions several nonhealing ulcerations bilateral feet.  During his stay it was noted that the patient had an ongoing infarction of the apical portion of his heart.  Cardiology was consulted.  They initially planned to do a cardiac catheterization but ultimately felt that it was far too advanced for the patient's advanced comorbidities.   ABIs were performed and showed extensive vascular disease.  Surgery was consulted and felt his wounds contained no active infection and that surgical management is not indicated at this time.  Palliative care was consulted and a family meeting was scheduled with the PCP, palliative and family and it was decided the best course of action for the patient was to transition his treatment to a comfort care approach.  He was discharged with comfort care medication orders to [facility].    Discharge Vitals:   BP 121/65 (BP Location: Right Arm)   Pulse 69   Temp 99.8 F (37.7 C) (Oral)   Resp 18   Ht 6' (1.829 m)   Wt 151 lb 3.8 oz (68.6 kg)   SpO2 100%   BMI 20.51 kg/m   Pertinent Labs, Studies, and Procedures:  CBC Latest Ref Rng & Units 11/23/2017 11/22/2017 11/21/2017  WBC 4.0 - 10.5 K/uL 13.7(H)  15.3(H) 17.8(H)  Hemoglobin 13.0 - 17.0 g/dL 1.6(X8.4(L) 0.9(U9.3(L) 0.4(V9.6(L)  Hematocrit 39.0 - 52.0 % 27.1(L) 30.4(L) 30.4(L)  Platelets 150 - 400 K/uL 246 251 305   BMP Latest Ref Rng & Units 11/23/2017 11/22/2017 11/21/2017  Glucose 65 - 99 mg/dL 409(W104(H) 119(J381(H) 478(G289(H)  BUN 6 - 20 mg/dL 7 14 10   Creatinine 0.61 - 1.24 mg/dL 9.560.79 2.130.92 0.860.80  BUN/Creat Ratio 10 - 24 - - -  Sodium 135 - 145 mmol/L 147(H) 142 144  Potassium 3.5 - 5.1 mmol/L 3.6 4.1 3.3(L)  Chloride 101 - 111 mmol/L 111 111 110  CO2 22 - 32 mmol/L 26 22 23   Calcium 8.9 - 10.3 mg/dL 5.7(Q8.5(L) 8.3(L) 8.2(L)   CLINICAL DATA:  Altered mental status.  Spasticity.   EXAM: MRI HEAD WITHOUT CONTRAST   TECHNIQUE: Multiplanar, multiecho pulse sequences of the brain and surrounding structures were obtained without intravenous contrast.   COMPARISON:  CT yesterday.  MRI 10/07/2017.   FINDINGS: Brain: Diffusion imaging does not show any acute or subacute infarction. There are extensive chronic ischemic changes throughout the brainstem. There are old cerebellar infarctions on the right and there is generalized cerebellar atrophy. There  are old infarctions within the thalami and basal ganglia. There are moderate chronic small-vessel ischemic changes affecting the deep and subcortical white matter. No large vessel territory infarction. No mass lesion, recent hemorrhage, hydrocephalus or extra-axial collection. Some hemosiderin deposition in the left basal ganglia related to old infarction.   Vascular: Major vessels at the base of the brain show flow. There may be slow flow in the posterior circulation.   Skull and upper cervical spine: Negative   Sinuses/Orbits: Mucosal inflammatory changes of the left maxillary sinus. Orbits negative.   Other: None   IMPRESSION: No acute finding. Extensive chronic ischemic changes throughout the brain as outlined above.     Electronically Signed   By: Paulina FusiMark  Shogry M.D.  Interpretation Summary   History: 62 year old male with encephalopathy   Sedation: None   Technique: This is a 21 channel routine scalp EEG performed at the bedside with bipolar and monopolar montages arranged in accordance to the international 10/20 system of electrode placement. One channel was dedicated to EKG recording.      Background: The background is dominated by generalized irregular delta and theta activity.  During periods of relative arousal, there is a posterior dominant rhythm of 7-8 Hz which is poorly sustained.  There were sleep structures seen briefly which were symmetric.  There were no epileptiform discharges.   Photic stimulation: Physiologic driving is not performed   EEG Abnormalities: 1) generalized irregular delta and theta activities 2) slow PDR   Clinical Interpretation: This EEG is consistent with a mild to moderate generalized nonspecific cerebral dysfunction (encephalopathy). There was no seizure or seizure predisposition recorded on this study. Please note that a normal EEG does not preclude the possibility of epilepsy.    Ritta SlotMcNeill Kirkpatrick, MD [facility] [phone]   Study Result   Result status: Final result                               *[place]*                   *[facility]*                         [address]  [place], [place] [address]                            [phone]   ------------------------------------------------------------------- Transthoracic Echocardiography   Patient:    Gad, Aymond MR #:       [id number] Study Date: 11/19/2017 Gender:     M Age:        54 Height:     182.9 cm Weight:     66.2 kg BSA:        1.82 m^2 Pt. Status: Room:       3E28C    PERFORMING   [facility], Inpatient  ADMITTING    Earl Lagos [id number]  GMWNUUVOZ    DGUYQIHK, Nischal [id number]  GLOVFIEP     PIRJJOAC, Nischal [id number]  Blima Ledger, Nischal [id number]  REFERRING    Camelia Phenes  SONOGRAPHER  Sinda Du, RDCS   cc:   ------------------------------------------------------------------- LV EF: 30% -   35%   ------------------------------------------------------------------- History:   PMH:  Elevated Troponin.  Stroke.  Risk factors: Hypertension. Diabetes mellitus. Dyslipidemia.   ------------------------------------------------------------------- Study Conclusions   - Left ventricle: The cavity size was mildly dilated. Wall   thickness was normal. Systolic function was moderately to   severely reduced. The estimated ejection fraction was in the   range of 30% to 35%. Akinesis of the mid-apicalanteroseptal   myocardium.   ------------------------------------------------------------------- Study data:  Comparison was made to the study of 10/06/2017.  Study status:  Routine.  Procedure:  Transthoracic echocardiography. Image quality was fair. The study was technically difficult, as a result of poor patient compliance and restricted patient mobility. Study completion:  There were no complications. Transthoracic echocardiography.   M-mode, complete 2D, spectral Doppler, and color Doppler.  Birthdate:  Patient birthdate: 08/02/1956.  Age:  Patient is 62 yr old.  Sex:  Gender: male. BMI: 19.8 kg/m^2.  Blood pressure:     122/91  Patient status: Inpatient.  Study date:  Study date: 11/19/2017. Study time: 09:05 AM.  Location:  Bedside.   -------------------------------------------------------------------   ------------------------------------------------------------------- Left ventricle:  The cavity size was mildly dilated. Wall thickness was normal. Systolic function was moderately to severely reduced. The estimated ejection fraction was in the range of 30% to 35%. Regional wall motion abnormalities:   Akinesis of the mid-apicalanteroseptal myocardium.   ------------------------------------------------------------------- Aortic valve:   Structurally normal valve.   Cusp separation was normal.  Doppler:  Transvalvular velocity was within the normal range. There was no stenosis. There was no regurgitation.   ------------------------------------------------------------------- Aorta:  Aortic root: The aortic root was normal in size. Ascending aorta: The ascending aorta was normal in size.   ------------------------------------------------------------------- Mitral valve:   Structurally normal valve.   Leaflet separation was normal.  Doppler:  Transvalvular velocity was within the normal range. There was no evidence for stenosis. There was no regurgitation.   ------------------------------------------------------------------- Left atrium:  The atrium was normal in size.   ------------------------------------------------------------------- Right ventricle:  The cavity size was normal. Systolic function was normal.   ------------------------------------------------------------------- Pulmonic valve:    The valve appears to be grossly normal. Doppler:  There was no significant regurgitation.    ------------------------------------------------------------------- Tricuspid valve:   Structurally normal valve.   Leaflet separation was normal.  Doppler:  Transvalvular velocity was within the normal range. There was no regurgitation.   ------------------------------------------------------------------- Right atrium:  The atrium was normal in size.   ------------------------------------------------------------------- Pericardium:  There was no pericardial effusion.   -------------------------------------------------------------------  Systemic veins: Inferior vena cava: The vessel was normal in size. The respirophasic diameter changes were in the normal range (>= 50%), consistent with normal central venous pressure.   ------------------------------------------------------------------- Measurements    Left ventricle                              Value        Reference  LV ID, ED, PLAX chordal                     49    mm     43 - 52  LV ID, ES, PLAX chordal           (H)       39    mm     23 - 38  LV fx shortening, PLAX chordal    (L)       20    %      >=29  LV PW thickness, ED                         14    mm     ---------  IVS/LV PW ratio, ED                         0.64         <=1.3  Stroke volume, 2D                           54    ml     ---------  Stroke volume/bsa, 2D                       30    ml/m^2 ---------  LV end-diastolic volume, 1-p A4C            136   ml     ---------  LV ejection fraction, 1-p A4C               33    %      ---------  LV end-diastolic volume/bsa, 1-p            75    ml/m^2 ---------  A4C  LV end-diastolic volume, 2-p                123   ml     ---------  LV end-systolic volume, 2-p                 84    ml     ---------  LV ejection fraction, 2-p                   32    %      ---------  Stroke volume, 2-p                          39    ml     ---------  LV end-diastolic volume/bsa, 2-p            67    ml/m^2 ---------  LV end-systolic  volume/bsa, 2-p             46    ml/m^2 ---------  Stroke volume/bsa, 2-p  21.4  ml/m^2 ---------    Ventricular septum                          Value        Reference  IVS thickness, ED                           9     mm     ---------    LVOT                                        Value        Reference  LVOT ID, S                                  21    mm     ---------  LVOT area                                   3.46  cm^2   ---------  LVOT peak velocity, S                       89.2  cm/s   ---------  LVOT mean velocity, S                       61.2  cm/s   ---------  LVOT VTI, S                                 15.5  cm     ---------  LVOT peak gradient, S                       3     mm Hg  ---------    Aorta                                       Value        Reference  Aortic root ID, ED                          32    mm     ---------    Left atrium                                 Value        Reference  LA ID, A-P, ES                              26    mm     ---------  LA ID/bsa, A-P                              1.43  cm/m^2 <=2.2  LA volume, S  42.9  ml     ---------  LA volume/bsa, S                            23.5  ml/m^2 ---------  LA volume, ES, 1-p A4C                      47.4  ml     ---------  LA volume/bsa, ES, 1-p A4C                  26    ml/m^2 ---------  LA volume, ES, 1-p A2C                      36.4  ml     ---------  LA volume/bsa, ES, 1-p A2C                  20    ml/m^2 ---------   Legend: (L)  and  (H)  mark values outside specified reference range.   ------------------------------------------------------------------- Prepared and Electronically Authenticated by   Kristeen Miss, M.D. 2019-04-06T13:06:41     Discharge Instructions: Discharge Instructions    Diet - low sodium heart healthy   Complete by:  As directed    Discharge instructions   Complete by:  As directed    Please ensure Mr.  Caffrey is able to receive comfort feeds but no ng tube placement.  Treat any end of life need that may arise with available medications and ensure he is comfortable and feeling well during his last days.   Increase activity slowly   Complete by:  As directed       Signed: Angelita Ingles, MD 11/23/2017, 4:49 PM

## 2017-11-22 NOTE — Progress Notes (Signed)
ABI's have been completed. Right 0.79 Left 0.48  11/22/17 11:40 AM Olen CordialGreg Zaydon Kinser RVT

## 2017-11-22 NOTE — Progress Notes (Signed)
ANTICOAGULATION CONSULT NOTE  Pharmacy Consult for heparin Indication: chest pain/ACS  Allergies  Allergen Reactions  . Atorvastatin     "leg aches"    Patient Measurements: Height: 6' (182.9 cm) Weight: 151 lb 3.8 oz (68.6 kg) IBW/kg (Calculated) : 77.6 Heparin Dosing Weight: 68.6 kg  Vital Signs: Temp: 98.6 F (37 C) (04/09 0458) Temp Source: Oral (04/09 0458) BP: 117/79 (04/09 0458) Pulse Rate: 73 (04/09 0458)  Labs: Recent Labs    11/20/17 [id number]  11/20/17 2242 11/21/17 [id number] 11/21/17 1632 11/22/17 0117 11/22/17 0739  HGB 9.2*  --   --  9.6*  --  9.3*  --   HCT 30.5*  --   --  30.4*  --  30.4*  --   PLT 353  --   --  305  --  251  --   LABPROT  --   --   --   --   --  16.8*  --   INR  --   --   --   --   --  1.38  --   HEPARINUNFRC 0.40  --  <0.10* <0.10* <0.10* 0.19* 0.28*  CREATININE 0.93   < > 0.78 0.80  --  0.92  --   CKTOTAL  --   --   --   --   --  401*  --    < > = values in this interval not displayed.   Assessment: 62 yo male on heparin for r/o ACS. RN changed IV site yesterday due to persistent undetectable levels. Heparin is almost therapeutic after several rate increases. CBC ok. No infusion issues or overt bleeding reported.  Goal of Therapy:  Heparin level 0.3-0.7 units/ml Monitor platelets by anticoagulation protocol: Yes   Plan:  -Increase heparin to 1800 units/hr -Check 8 hour level -Daily heparin level, CBC  Henley Blyth L Nickol Collister  11/22/2017 9:49 AM

## 2017-11-23 ENCOUNTER — Encounter (HOSPITAL_COMMUNITY)
Admission: EM | Disposition: A | Discharge status: Hospice | Payer: Self-pay | Source: Home / Self Care | Attending: Internal Medicine

## 2017-11-23 DIAGNOSIS — L97519 Non-pressure chronic ulcer of other part of right foot with unspecified severity: Secondary | ICD-10-CM

## 2017-11-23 DIAGNOSIS — L97529 Non-pressure chronic ulcer of other part of left foot with unspecified severity: Secondary | ICD-10-CM

## 2017-11-23 DIAGNOSIS — Z7401 Bed confinement status: Secondary | ICD-10-CM

## 2017-11-23 LAB — BASIC METABOLIC PANEL
Anion gap: 10 (ref 5–15)
BUN: 7 mg/dL (ref 6–20)
CALCIUM: 8.5 mg/dL — AB (ref 8.9–10.3)
CO2: 26 mmol/L (ref 22–32)
CREATININE: 0.79 mg/dL (ref 0.61–1.24)
Chloride: 111 mmol/L (ref 101–111)
GFR calc Af Amer: >60 mL/min (ref >60)
Glucose, Bld: 104 mg/dL — ABNORMAL HIGH (ref 65–99)
Potassium: 3.6 mmol/L (ref 3.5–5.1)
Sodium: 147 mmol/L — ABNORMAL HIGH (ref 135–145)

## 2017-11-23 LAB — CULTURE, BLOOD (ROUTINE X 2)
CULTURE: NO GROWTH
Culture: NO GROWTH
SPECIAL REQUESTS: ADEQUATE
SPECIAL REQUESTS: ADEQUATE

## 2017-11-23 LAB — CBC
HCT: 27.1 % — ABNORMAL LOW (ref 39.0–52.0)
HEMOGLOBIN: 8.4 g/dL — AB (ref 13.0–17.0)
MCH: 27.5 pg (ref 26.0–34.0)
MCHC: 31 g/dL (ref 30.0–36.0)
MCV: 88.6 fL (ref 78.0–100.0)
PLATELETS: 246 10*3/uL (ref 150–400)
RBC: 3.06 MIL/uL — ABNORMAL LOW (ref 4.22–5.81)
RDW: 14.8 % (ref 11.5–15.5)
WBC: 13.7 10*3/uL — ABNORMAL HIGH (ref 4.0–10.5)

## 2017-11-23 LAB — GLUCOSE, CAPILLARY
Glucose-Capillary: 124 mg/dL — ABNORMAL HIGH (ref 65–99)
Glucose-Capillary: 114 mg/dL — ABNORMAL HIGH (ref 65–99)

## 2017-11-23 LAB — MRSA PCR SCREENING: MRSA BY PCR: NEGATIVE

## 2017-11-23 LAB — HEPARIN LEVEL (UNFRACTIONATED): HEPARIN UNFRACTIONATED: 0.18 [IU]/mL — AB (ref 0.30–0.70)

## 2017-11-23 SURGERY — LEFT HEART CATH AND CORONARY ANGIOGRAPHY
Anesthesia: LOCAL

## 2017-11-23 MED ORDER — COLLAGENASE 250 UNIT/GM EX OINT: TOPICAL_OINTMENT | Freq: Every day | CUTANEOUS | 0 refills | Status: AC

## 2017-11-23 MED ORDER — LORAZEPAM 2 MG/ML IJ SOLN: 1.0000 mg | INTRAMUSCULAR | Status: DC | PRN

## 2017-11-23 MED ORDER — MORPHINE SULFATE (CONCENTRATE) 10 MG/0.5ML PO SOLN: 5.0000 mg | ORAL | Status: DC | PRN

## 2017-11-23 MED ORDER — HALOPERIDOL 0.5 MG PO TABS: 0.5000 mg | ORAL_TABLET | ORAL | 0 refills | Status: AC | PRN

## 2017-11-23 MED ORDER — HALOPERIDOL LACTATE 2 MG/ML PO CONC: 1.0000 mg | ORAL | 0 refills | Status: AC | PRN

## 2017-11-23 MED ORDER — GLYCOPYRROLATE 0.2 MG/ML IJ SOLN: 0.2000 mg | INTRAMUSCULAR | Status: AC | PRN

## 2017-11-23 MED ORDER — POLYVINYL ALCOHOL 1.4 % OP SOLN: 1.0000 [drp] | Freq: Four times a day (QID) | OPHTHALMIC | 0 refills | Status: AC | PRN

## 2017-11-23 MED ORDER — ONDANSETRON HCL 4 MG/2ML IJ SOLN: 4.0000 mg | Freq: Four times a day (QID) | INTRAMUSCULAR | 0 refills | Status: AC | PRN

## 2017-11-23 MED ORDER — LORAZEPAM 2 MG/ML PO CONC: 1.0000 mg | ORAL | 0 refills | Status: AC | PRN

## 2017-11-23 MED ORDER — BIOTENE DRY MOUTH MT LIQD: 15.0000 mL | OROMUCOSAL | Status: AC | PRN

## 2017-11-23 MED ORDER — HALOPERIDOL LACTATE 5 MG/ML IJ SOLN: 0.5000 mg | INTRAMUSCULAR | 0 refills | Status: AC | PRN

## 2017-11-23 MED ORDER — POTASSIUM CL IN DEXTROSE 5% 20 MEQ/L IV SOLN
20.0000 meq | INTRAVENOUS | Status: DC
  Filled 2017-11-23: qty 1000

## 2017-11-23 MED ORDER — BACLOFEN 10 MG PO TABS: 10.0000 mg | ORAL_TABLET | Freq: Four times a day (QID) | ORAL | 0 refills | Status: AC

## 2017-11-23 MED ORDER — MORPHINE SULFATE (CONCENTRATE) 10 MG/0.5ML PO SOLN: 5.0000 mg | ORAL | 0 refills | Status: AC | PRN

## 2017-11-23 MED ORDER — GLYCOPYRROLATE 0.2 MG/ML IJ SOLN
0.2000 mg | INTRAMUSCULAR | Status: DC | PRN
  Filled 2017-11-23: qty 1

## 2017-11-23 MED ORDER — METOPROLOL TARTRATE 25 MG PO TABS: 25.0000 mg | ORAL_TABLET | Freq: Two times a day (BID) | ORAL | Status: AC

## 2017-11-23 MED ORDER — LORAZEPAM 1 MG PO TABS: 1.0000 mg | ORAL_TABLET | ORAL | 0 refills | Status: AC | PRN

## 2017-11-23 MED ORDER — LORAZEPAM 2 MG/ML IJ SOLN: 1.0000 mg | INTRAMUSCULAR | 0 refills | Status: AC | PRN

## 2017-11-23 MED ORDER — GLYCOPYRROLATE 1 MG PO TABS
1.0000 mg | ORAL_TABLET | ORAL | Status: DC | PRN
  Filled 2017-11-23: qty 1

## 2017-11-23 MED ORDER — LORAZEPAM 2 MG/ML PO CONC: 1.0000 mg | ORAL | Status: DC | PRN

## 2017-11-23 MED ORDER — HALOPERIDOL LACTATE 5 MG/ML IJ SOLN: 0.5000 mg | INTRAMUSCULAR | Status: DC | PRN

## 2017-11-23 MED ORDER — LORAZEPAM 1 MG PO TABS
1.0000 mg | ORAL_TABLET | ORAL | Status: DC | PRN
Start: 2017-11-23 — End: 2017-11-23

## 2017-11-23 MED ORDER — ONDANSETRON 4 MG PO TBDP
4.0000 mg | ORAL_TABLET | Freq: Four times a day (QID) | ORAL | Status: DC | PRN
Start: 2017-11-23 — End: 2017-11-23

## 2017-11-23 MED ORDER — ACETAMINOPHEN 325 MG PO TABS: 650.0000 mg | ORAL_TABLET | Freq: Four times a day (QID) | ORAL | Status: AC | PRN

## 2017-11-23 MED ORDER — POLYVINYL ALCOHOL 1.4 % OP SOLN
1.0000 [drp] | Freq: Four times a day (QID) | OPHTHALMIC | Status: DC | PRN
  Filled 2017-11-23: qty 15

## 2017-11-23 MED ORDER — HALOPERIDOL 0.5 MG PO TABS
0.5000 mg | ORAL_TABLET | ORAL | Status: DC | PRN
  Filled 2017-11-23: qty 1

## 2017-11-23 MED ORDER — BIOTENE DRY MOUTH MT LIQD: 15.0000 mL | OROMUCOSAL | Status: DC | PRN

## 2017-11-23 MED ORDER — ONDANSETRON HCL 4 MG/2ML IJ SOLN: 4.0000 mg | Freq: Four times a day (QID) | INTRAMUSCULAR | Status: DC | PRN

## 2017-11-23 MED ORDER — HALOPERIDOL LACTATE 2 MG/ML PO CONC
0.5000 mg | ORAL | Status: DC | PRN
  Filled 2017-11-23: qty 0.3

## 2017-11-23 MED ORDER — GLYCOPYRROLATE 1 MG PO TABS: 1.0000 mg | ORAL_TABLET | ORAL | Status: AC | PRN

## 2017-11-23 MED ORDER — ONDANSETRON 4 MG PO TBDP: 4.0000 mg | ORAL_TABLET | Freq: Four times a day (QID) | ORAL | 0 refills | Status: AC | PRN

## 2017-11-23 NOTE — Clinical Social Work Note (Signed)
Clinical Social Work Assessment  Patient Details  Name: [name] Williams MRN: [id number] Date of Birth: 22-Nov-1955  Date of referral:  11/23/17               Reason for consult:  Facility Placement, Discharge Planning, End of Life/Hospice                Permission sought to share information with:  Facility Medical sales representativeContact Representative, Family Supports Permission granted to share information::  Yes, Verbal Permission Granted  Name::     Freddi CheJoann Kok  Agency::  [facility]  Relationship::  Sister  Contact Information:  [phone]  Housing/Transportation Living arrangements for the past 2 months:  Skilled Nursing Facility Source of Information:  Medical Team, Siblings Patient Interpreter Needed:  None Criminal Activity/Legal Involvement Pertinent to Current Situation/Hospitalization:  No - Comment as needed Significant Relationships:  Siblings Lives with:  Facility Resident Do you feel safe going back to the place where you live?  Yes Need for family participation in patient care:  Yes (Comment)  Care giving concerns:  Residential hospice referral.   Social Worker assessment / plan:  Patient not fully oriented. No supports at bedside. CSW [facility] She will review and contact patient's sister. CSW called patient's sister, introduced role, and notified her that referral had been made and Carley Hammedva would be reaching out to her. Patient's sister expressed understanding. No further concerns. CSW encouraged patient's sister to contact CSW as needed. CSW will continue to follow patient and his sister for support and facilitate discharge to hospice once a bed is available.  Employment status:  Disabled (Comment on whether or not currently receiving Disability) Insurance information:  Other (Comment Required)(PACE) PT Recommendations:  Not assessed at this time Information / Referral to community resources:  Other (Comment Required)(Hospice)  Patient/Family's Response  to care:  Patient not fully oriented. Patient's sister agreeable to inpatient hospice referral. Patient's siblings supportive and involved in patient's care. Patient's sister appreciated social work intervention.  Patient/Family's Understanding of and Emotional Response to Diagnosis, Current Treatment, and Prognosis:  Patient not fully oriented. Patient's sister has a good understanding of the reason for admission and patient's prognosis. Patient's sister appears happy with hospital care.  Emotional Assessment Appearance:  Appears stated age Attitude/Demeanor/Rapport:  Unable to Assess Affect (typically observed):  Unable to Assess Orientation:  Oriented to Self, Oriented to Place Alcohol / Substance use:  Never Used Psych involvement (Current and /or in the community):  No (Comment)  Discharge Needs  Concerns to be addressed:  Care Coordination Readmission within the last 30 days:  No Current discharge risk:  Cognitively Impaired, Terminally ill Barriers to Discharge:  Other([facility]   Margarito LinerSarah C Averiana Clouatre, LCSW 11/23/2017, 3:02 PM

## 2017-11-23 NOTE — Clinical Social Work Note (Addendum)
CSW acknowledges SNF consult. Patient was admitted from [facility]. CSW is following for palliative recommendations before meeting with family.  Dayton Scrape, [place] [phone]  11:43 am Received call from [place] worker, Sindy Messing. She stated patient's daughter met with his PCP this morning and she would like for him to go to [facility] if he qualifies. She asked that [place] page palliative PA to notify.  Dayton Scrape, [place]

## 2017-11-23 NOTE — Progress Notes (Addendum)
  Received a call from CSW that patient's family has opted for full comfort and hospice house if he qualifies.  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 Patient is at high risk for an acute event that could take his life given recurrent infections, multiple wounds, recent NSTEMI, he has no desire for PO intake and is bed bound.  Recommendation:  Full comfort.  [facility].  I will attempt to reach out to the family to confirm comfort measures only and change his code status.  Family ([name] Tien) returned my call.  She confirmed that she does not want her brother resuscitated.  [facility]  Time 25 min.  Norvel RichardsMarianne Amadi Frady, PA-C Palliative Medicine Pager: [phone]

## 2017-11-23 NOTE — Clinical Social Work Note (Signed)
CSW facilitated patient discharge including contacting patient family (sister, Chyrl CivatteJoann) and facility to confirm patient discharge plans. Clinical information faxed to facility and family agreeable with plan. CSW arranged ambulance transport via [facility] to [facility]. RN to call report prior to discharge [phone]).  CSW will sign off for now as social work intervention is no longer needed. Please consult us again if new needs arise.  Charlynn CourtSarah Khamarion Bjelland, CSW [phone]

## 2017-11-23 NOTE — Progress Notes (Addendum)
Progress Note  Patient Name: [name] Williams Date of Encounter: 11/23/2017  Primary Cardiologist: Dr. Eden Emms   Subjective   Pt not verbalizing today. Not following commands. Will not pursue cath today given his AMS. Will stop Hep gtt and he will likely benefit from palliative care medicine.   Inpatient Medications    Scheduled Meds: . aspirin  81 mg Oral Daily  . baclofen  10 mg Oral QID  . collagenase   Topical Daily  . collagenase   Topical Daily  . donepezil  10 mg Oral QHS  . insulin aspart  0-15 Units Subcutaneous Q6H  . insulin aspart  3 Units Subcutaneous TID WC  . insulin glargine  8 Units Subcutaneous QHS  . metoprolol tartrate  25 mg Oral BID  . multivitamin with minerals  1 tablet Oral Daily  . ramipril  2.5 mg Oral Daily  . sodium chloride flush  3 mL Intravenous Q12H   Continuous Infusions: . sodium chloride    . sodium chloride    . heparin 1,800 Units/hr (11/22/17 2028)  . lactated ringers 75 mL/hr at 11/22/17 1854  . piperacillin-tazobactam (ZOSYN)  IV 3.375 g (11/23/17 0534)   PRN Meds: sodium chloride, acetaminophen **OR** acetaminophen, RESOURCE THICKENUP CLEAR, sodium chloride flush   Vital Signs    Vitals:   11/22/17 1001 11/22/17 2004 11/23/17 0020 11/23/17 0434  BP: 121/90 108/69 120/68 121/65  Pulse: 89 72 75 69  Resp:  16 16 18   Temp: 98.3 F (36.8 C) 98.9 F (37.2 C) 99.4 F (37.4 C) 99.8 F (37.7 C)  TempSrc: Oral Oral Oral Oral  SpO2: 100% 100% 100% 100%  Weight:      Height:        Intake/Output Summary (Last 24 hours) at 11/23/2017 0747 Last data filed at 11/23/2017 0656 Gross per 24 hour  Intake 1614.75 ml  Output 1 ml  Net 1613.75 ml   Filed Weights   11/19/17 0323 11/20/17 0708 11/21/17 0404  Weight: 145 lb 15.1 oz (66.2 kg) 150 lb 5.7 oz (68.2 kg) 151 lb 3.8 oz (68.6 kg)   Physical Exam   General: Elderly, ill-appearing, NAD Skin: Warm, dry, intact  Head: Normocephalic, atraumatic, sclera non-icteric, no  xanthomas, clear, moist mucus membranes. Neck: Negative for carotid bruits. No JVD Lungs:Clear to ausculation bilaterally. No wheezes, rales, or rhonchi. Breathing is unlabored. Cardiovascular: RRR with S1 S2. No murmurs, rubs, or gallops Abdomen: Soft, non-tender, non-distended with normoactive bowel sounds. No obvious abdominal masses. Extremities: No edema. No clubbing or cyanosis. DP/PT pulses 1+ bilaterally Neuro: Unable to access Psych: Unable to access  Labs    Chemistry Recent Labs  Lab 11/18/17 1520  11/20/17 2242 11/21/17 0625 11/22/17 0117  NA 146*   < > 146* 144 142  K 4.4   < > 3.5 3.3* 4.1  CL 109   < > 113* 110 111  CO2 22   < > 25 23 22   GLUCOSE 367*   < > 238* 289* 381*  BUN 28*   < > 13 10 14   CREATININE 1.06   < > 0.78 0.80 0.92  CALCIUM 8.9   < > 8.4* 8.2* 8.3*  PROT 6.1*  --   --   --   --   ALBUMIN 1.9*  --   --   --   --   AST 33  --   --   --   --   ALT 30  --   --   --   --  ALKPHOS 67  --   --   --   --   BILITOT 0.7  --   --   --   --   GFRNONAA >60   < > >60 >60 >60  GFRAA >60   < > >60 >60 >60  ANIONGAP 15   < > 8 11 9    < > = values in this interval not displayed.     Hematology Recent Labs  Lab 11/21/17 0625 11/22/17 0117 11/23/17 0418  WBC 17.8* 15.3* 13.7*  RBC 3.48* 3.47* 3.06*  HGB 9.6* 9.3* 8.4*  HCT 30.4* 30.4* 27.1*  MCV 87.4 87.6 88.6  MCH 27.6 26.8 27.5  MCHC 31.6 30.6 31.0  RDW 14.4 14.4 14.8  PLT 305 251 246    Cardiac Enzymes Recent Labs  Lab 11/18/17 2007 11/19/17 0041 11/19/17 0556  TROPONINI 2.36* 2.68* 2.36*    Recent Labs  Lab 11/18/17 1539  TROPIPOC 2.62*     BNPNo results for input(s): BNP, PROBNP in the last 168 hours.   DDimer No results for input(s): DDIMER in the last 168 hours.   Radiology    Dg Swallowing Func-speech Pathology  Result Date: 11/21/2017 Objective Swallowing Evaluation: Type of Study: MBS-Modified Barium Swallow Study  Patient Details Name: [name] Williams MRN: [id number]  Date of Birth: [birth year] Today's Date: 11/21/2017 Time: SLP Start Time (ACUTE ONLY): 1029 -SLP Stop Time (ACUTE ONLY): 1044 SLP Time Calculation (min) (ACUTE ONLY): 15 min Past Medical History: Past Medical History: Diagnosis Date . Acute pyelonephritis  . Altered mental status, unspecified 10/04/2017 . 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 Bacteremia  . 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 Cerebral thrombosis with cerebral infarction 10/08/2017 . Chronic diastolic heart failure (HCC) 11/09/2017 . Chronic systolic congestive heart failure (HCC)  . CVA (cerebral infarction) 06/26/2014 . Diabetes mellitus  . Diabetes mellitus (HCC)  . Diplopia  . Dizziness  . DYSLIPIDEMIA 04/08/2008  Qualifier: Diagnosis of  By: Sondra Barges MD, Zac   . Elevated troponin  . Fever  . History of stroke  . HLD (hyperlipidemia) 04/08/2008  Centricity Description: DIABETES-TYPE 2 Qualifier: Diagnosis of  By: Sondra Barges MD, Zac   Centricity Description: DIABETES MELLITUS, TYPE II Qualifier: Diagnosis of  By: Sondra Barges MD, Zac   . Hypertension  . Hypertensive heart disease with heart failure (HCC)  . 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 Intracranial vascular stenosis 06/23/2014 . Leukocytosis  . Preventative health care 08/29/2013 . Schizophrenia (HCC) 10/31/2014 . Sensory deficit, left 06/26/2014 . Sepsis (HCC)  . Stroke (HCC)  . Stroke-like symptom 06/23/2014 . TOBACCO ABUSE 04/08/2008  Qualifier: Diagnosis of  By: Sondra Barges MD, Zac   Past Surgical History: Past Surgical History: Procedure Laterality Date . TEE WITHOUT CARDIOVERSION N/A 10/10/2017  Procedure: TRANSESOPHAGEAL ECHOCARDIOGRAM (TEE);  Surgeon: Lewayne Bunting, MD;  Location: [facility] ENDOSCOPY;  Service: Cardiovascular;  Laterality: N/A; HPI: Patient is 62 year old male with past medical history of CVA, hypertension, hyperlipidemia, diabetes, schizophrenia who presented to the ED from SNF with worsening altered mental status and fever. CXR shows left lower lobe haziness consistent with atelectasis or  infiltrate. Per chart admitted February 2019 for similar presentation (altered mental status,  fever and found to be bacteremic from urinary source). Pt evaluated by ST 09/17/17 with recommendation for regular diet, thin liquids, no further follow-up. Pt had prior brainstem and cerebellar CVA (2011, 2015) with no reported swallowing deficits. MBS recommended after po trial.  Subjective: Pt in bed, lethargic Assessment / Plan / Recommendation CHL IP CLINICAL IMPRESSIONS 11/21/2017 Clinical  Impression Moderate-severely decreased lingual manipulation/propulsion, cohesion leading to delayed transit and intermittent lingual residue. Sensory deficits likely in combination with cognitive impairments resulted in swallow initiation at the pyriform sinuses for majority of swallows. Required multiple verbal and tactile cues to propel bolus and initiated swallow with barium sitting in valleculae or pyriform sinuses. Laryngeal penetration to vocal cords with nectar consistency before laryngeal protection initiated and aspirated during the study without sensation and unable to throat clear/cough when cued. High aspiration risk with honey thick via cup due to delayed swallow initiation and moderate valleculae and pyriform sinsus residue although teaspoon amounts consistently did not enter vestibule and no appreciable residue with tsp honey and puree. Recommend honey thick liquids via TSP only and puree, check oral cavity at end of meals, ensure pt swallowed before subsequent bites, full assist/supervision and crush pills.  SLP Visit Diagnosis Dysphagia, oropharyngeal phase (R13.12) Attention and concentration deficit following -- Frontal lobe and executive function deficit following -- Impact on safety and function Moderate aspiration risk;Severe aspiration risk   CHL IP TREATMENT RECOMMENDATION 11/21/2017 Treatment Recommendations Therapy as outlined in treatment plan below   Prognosis 11/21/2017 Prognosis for Safe Diet Advancement Good  Barriers to Reach Goals Cognitive deficits Barriers/Prognosis Comment -- CHL IP DIET RECOMMENDATION 11/21/2017 SLP Diet Recommendations Honey thick liquids;Dysphagia 1 (Puree) solids Liquid Administration via Spoon Medication Administration Crushed with puree Compensations Minimize environmental distractions;Slow rate;Small sips/bites;Lingual sweep for clearance of pocketing Postural Changes Seated upright at 90 degrees   CHL IP OTHER RECOMMENDATIONS 11/21/2017 Recommended Consults -- Oral Care Recommendations Oral care BID Other Recommendations --   CHL IP FOLLOW UP RECOMMENDATIONS 11/21/2017 Follow up Recommendations 24 hour supervision/assistance   CHL IP FREQUENCY AND DURATION 11/21/2017 Speech Therapy Frequency (ACUTE ONLY) min 2x/week Treatment Duration 2 weeks      CHL IP ORAL PHASE 11/21/2017 Oral Phase Impaired Oral - Pudding Teaspoon -- Oral - Pudding Cup -- Oral - Honey Teaspoon Delayed oral transit;Decreased bolus cohesion;Reduced posterior propulsion Oral - Honey Cup Delayed oral transit;Decreased bolus cohesion;Reduced posterior propulsion Oral - Nectar Teaspoon Delayed oral transit;Decreased bolus cohesion;Reduced posterior propulsion Oral - Nectar Cup -- Oral - Nectar Straw -- Oral - Thin Teaspoon -- Oral - Thin Cup -- Oral - Thin Straw -- Oral - Puree Delayed oral transit;Decreased bolus cohesion;Lingual/palatal residue;Reduced posterior propulsion Oral - Mech Soft -- Oral - Regular -- Oral - Multi-Consistency -- Oral - Pill -- Oral Phase - Comment --  CHL IP PHARYNGEAL PHASE 11/21/2017 Pharyngeal Phase Impaired Pharyngeal- Pudding Teaspoon -- Pharyngeal -- Pharyngeal- Pudding Cup -- Pharyngeal -- Pharyngeal- Honey Teaspoon Delayed swallow initiation-pyriform sinuses Pharyngeal -- Pharyngeal- Honey Cup Pharyngeal residue - valleculae;Pharyngeal residue - pyriform;Reduced epiglottic inversion;Delayed swallow initiation-pyriform sinuses Pharyngeal -- Pharyngeal- Nectar Teaspoon Delayed swallow initiation-pyriform  sinuses;Penetration/Aspiration before swallow Pharyngeal Material enters airway, passes BELOW cords without attempt by patient to eject out (silent aspiration) Pharyngeal- Nectar Cup -- Pharyngeal -- Pharyngeal- Nectar Straw -- Pharyngeal -- Pharyngeal- Thin Teaspoon -- Pharyngeal -- Pharyngeal- Thin Cup -- Pharyngeal -- Pharyngeal- Thin Straw -- Pharyngeal -- Pharyngeal- Puree Delayed swallow initiation-pyriform sinuses Pharyngeal -- Pharyngeal- Mechanical Soft -- Pharyngeal -- Pharyngeal- Regular -- Pharyngeal -- Pharyngeal- Multi-consistency -- Pharyngeal -- Pharyngeal- Pill -- Pharyngeal -- Pharyngeal Comment --  CHL IP CERVICAL ESOPHAGEAL PHASE 11/21/2017 Cervical Esophageal Phase WFL Pudding Teaspoon -- Pudding Cup -- Honey Teaspoon -- Honey Cup -- Nectar Teaspoon -- Nectar Cup -- Nectar Straw -- Thin Teaspoon -- Thin Cup -- Thin Straw -- Puree -- Mechanical Soft --  Regular -- Multi-consistency -- Pill -- Cervical Esophageal Comment -- No flowsheet data found. [name], Lisa Willis 11/21/2017, 12:11 PM Breck CoonsLisa Willis Lonell FaceLitaker M.Ed CCC-SLP Pager [phone]              Telemetry    11/23/17 NSR 72 - Personally Reviewed  ECG    11/21/17 NSR with large T wave inversions in leads II, III, V4-V6 - Personally Reviewed  Cardiac Studies   Echocardiogram  11/19/17 - Left ventricle: The cavity size was mildly dilated. Wall thickness was normal. Systolic function was moderately to severely reduced. The estimated ejection fraction was in the range of 30% to 35%. Akinesis of the mid-apicalanteroseptal myocardium.  Patient Profile     62 y.o. male  with a hx of CVA 2015 and in 2019, hypertension, hyperlipidemia and diabeteswho is being followed by cardiology for the evaluation and management ofnew systolic heart failureat the request of Nischal Narendra.  Assessment & Plan    1. Non-STEMI:  -Trop, 2.63>2.68>2.36   -EKG with patterns consistent with anterior ischemia with large T-wave inversion in  II, III, V4-V6 and WMA per echocardiogram   -Plan was for cardiac cath yesterday, 11/22/17 however pt was not responsive and was not considered a cath candidate at that point. Unoforunatley, he status has not improved over the course of 24H and will likely not be considered once again for cath today, 11/23/17 -Continue ASA, will discontinue Hep gtt   2. Systolic heart failure with LVEF 30-35%:  -Per echocardiogram on 11/19/17 with LVEF of 30-35% and akinesis of mid-apicalanteroseptal myocardium. - Last echo in 09/2017 with EF of 50-55% no respiratory distress -Continue beta blocker and ACE  -Renal function WNL, cr, 0.79  3. DM: -Per primary team  -SSI  4. CVA with encephalopathy: -MRI without acute infarct but likely worsening of chronic vascular disease -Neurology has signed off  -EEG consistent with a mild to moderate generalized nonspecific cerebral dysfunction (encephalopathy).    Signed, Georgie ChardJill McDaniel NP-C [facility] Pager: [phone] 11/23/2017, 7:47 AM     For questions or updates, please contact   Please consult [email or web address] for contact info under Cardiology/STEMI.  Patient examined chart reviewed. No obvious cardiac symptoms MS continues to be an issue Non responsive CVA with encephalopathy. Will not pursue heart catheterization at this time would agree with SNF and palliative consult  Will sign off  Charlton HawsPeter Leveta Wahab

## 2017-11-23 NOTE — Progress Notes (Addendum)
Nutrition Brief Note  Chart reviewed. Pt now transitioning to comfort care.  No further nutrition interventions warranted at this time.  Please re-consult as needed.   Sherena Machorro A. Alece Koppel, RD, LDN, CDE Pager: [phone] After hours Pager: [phone]  

## 2017-11-23 NOTE — Progress Notes (Signed)
Called attempt to give report for to [facility].

## 2017-11-23 NOTE — Progress Notes (Signed)
Subjective: no acute events overnight, patient would not respond to questions or follow commands again today. Grimaced to pain with movement of his legs  Objective:  Vital signs in last 24 hours: Vitals:   11/22/17 1001 11/22/17 2004 11/23/17 0020 11/23/17 0434  BP: 121/90 108/69 120/68 121/65  Pulse: 89 72 75 69  Resp:  16 16 18   Temp: 98.3 F (36.8 C) 98.9 F (37.2 C) 99.4 F (37.4 C) 99.8 F (37.7 C)  TempSrc: Oral Oral Oral Oral  SpO2: 100% 100% 100% 100%  Weight:      Height:       Physical Exam  Constitutional:  Chronically ill appearing male   Cardiovascular: Regular rhythm. Exam reveals gallop and S4. Exam reveals no friction rub.  No murmur heard. Pulmonary/Chest: Effort normal. No respiratory distress. He has no wheezes.  Coarse breath sounds throughout  Abdominal: He exhibits no distension. There is no tenderness.  Musculoskeletal:     Neurological: He is alert.  Skin: Skin is warm and dry.  Bilateral foot wounds, no palpable pulses.  Pt with sacral decub does not look infected at this time   BMP Latest Ref Rng & Units 11/23/2017 11/22/2017 11/21/2017  Glucose 65 - 99 mg/dL 161(W) 960(A) 540(J)  BUN 6 - 20 mg/dL 7 14 10   Creatinine 0.61 - 1.24 mg/dL 8.11 9.14 7.82  BUN/Creat Ratio 10 - 24 - - -  Sodium 135 - 145 mmol/L 147(H) 142 144  Potassium 3.5 - 5.1 mmol/L 3.6 4.1 3.3(L)  Chloride 101 - 111 mmol/L 111 111 110  CO2 22 - 32 mmol/L 26 22 23   Calcium 8.9 - 10.3 mg/dL 9.5(A) 8.3(L) 8.2(L)   CBC Latest Ref Rng & Units 11/23/2017 11/22/2017 11/21/2017  WBC 4.0 - 10.5 K/uL 13.7(H) 15.3(H) 17.8(H)  Hemoglobin 13.0 - 17.0 g/dL 2.1(H) 0.8(M) 5.7(Q)  Hematocrit 39.0 - 52.0 % 27.1(L) 30.4(L) 30.4(L)  Platelets 150 - 400 K/uL 246 251 305    Assessment/Plan:  Active Problems:   Sepsis (HCC)   Pressure injury of skin   NSTEMI (non-ST elevated myocardial infarction) (HCC)   Aspiration pneumonia [facility])   Palliative care encounter   Goals of care,  counseling/discussion  Sepsis 2/2 to pneumonia and UTI Patient was started on vancomycin/Zosyn on admission.  Patient is afebrile with tachycardia currently. Blood pressure stable in the 120s systolic and oxygenating well on room air.Treating for possible aspiration pneumonia  with Zosyn. D/ced vancomycin as this is not likely a MRSA pneumonia.  White count improving.  -infectious process seems to be treated -pt now transitioning to comfort   Ischemic cardiomyopathy: NSTEMI with Elevated troponin I 2.3 on admission and remained flat.  On heparin.  Pt likely with evolving anteroseptal  infarct prior to admission.  EKG without acute ischemic changes.  Echo demonstrating EF 30-35%, akinesis of mid apicalanteroseptal myocardium prior ECHO in February EF 50-55% and NRWMA .  -cath cancelled, heparin stopped, cardiology has signed off as pt now comfort care  Continuing Neurological decline, H/o multiple CVAs: Pt has a history of multiple CVAs, last in Feb 2019 in the setting of sepsis and has had subsequent decline in function since that time and currently residing in SNF, residual L sided deficits with lower extremity contractures.  Continued decline in neurological status in the past couple of months, worse in the past few weeks, with not a clear explantation will consult neurology for input on patient's case. Pt now with sespis like picture likely 2/2 aspiration pneumonia taxing  a brain with poor reserve.     - MRI without acute infarct but worsening of chronic vascular disease - MRI cervical spine shows only mild foraminal narrowing at C4-6, Venous duplex negative, EEG pending -pt will now be comfort care due to progressive decline  Hypernatremia: Sodium 149 on 4/6, was 146 on 4/5,  Was 135 a month ago, was started on florinef for blood pressure  -holding fludricortisone   H/o DM On home metformin, SSI novolog. Most recent A1c 7.7, cbgs still elevated  -will hold insulin as pt now comfort  care  Multiple Wounds: pt with heel ulcerations that are non healing due to peripheral vascular disease, also with toe and other areas of foot,  sacral decubitus ulcer, shin and scrotal lesions  -ABI's show poor perfusion to bilateral feet -Surgery consulted do not feel there is an active infection from patient's wounds have signed off -wound care consulted appreciate assistance  Dispo: Anticipated discharge pending family discussion  with palliative care but spoke with PCP Dr. Dorothe PeaKoehler and confirmed could begin instituting comfort care measures.   Dispo is to hospice facility    Angelita InglesWinfrey, Momoko Slezak B, MD 11/23/2017, 1:17 PM Thornell MuleBrandon Spencer Cardinal MD PGY-1 Internal Medicine Pager # [phone]

## 2017-11-23 NOTE — Progress Notes (Signed)
Internal Medicine Attending:   I saw and examined the patient. I reviewed the resident's note and I agree with the resident's findings and plan as documented in the resident's note.  Patient remains unresponsive to questions or commands and does not follow commands.  He does grimace with pain on movement of his legs.  Patient was initially admitted for sepsis likely secondary to aspiration pneumonia and was treated with IV Zosyn.  His leukocytosis is resolving and is down to 14 today but his overall mental status continues to decline.  Palliative care follow-up appreciated.  Patient's family apparently conveyed to social worker that the would want to transition him to comfort care and transfer him to hospice house once bed is available.  Patient has poor prognosis in the setting of recurrent aspiration pneumonia, ischemic cardia myopathy, multiple CVAs and a neuro degenerative process as well as nonhealing wounds secondary to his peripheral vascular disease in the setting of poor oral intake and being bedbound and is a candidate for inpatient hospice.  Once palliative care confirms patient is comfort care will DC antibiotics and continue with palliative measures only.  No further workup at this time.

## 2017-11-23 NOTE — Progress Notes (Signed)
Patient has had a fluctuating level of consciousness, asked for grapefruit juice I thickended some grape juice and spoon feed to patient at the end he cough once with no distress, patient has been calm and comfortable. Incontinence on over lay mattress. Plan to go to [facility] today SW to notify MD.

## 2017-11-23 NOTE — Progress Notes (Signed)
Physical Therapy Wound Treatment Patient Details  Name: [name] Williams MRN: [id number] Date of Birth: 1956-04-17  Today's Date: 11/23/2017 Time: 1110-1201 Time Calculation (min): 51 min  Subjective  Subjective: none stated Patient and Family Stated Goals: to heal wound  Pain Score:  Pt with limited pain medication available due to NPO and medical complexity. Pt with 10/10 Facial Pain score with movement.   Wound Assessment  Pressure Injury 11/18/17 Unstageable - Full thickness tissue loss in which the base of the ulcer is covered by slough (yellow, tan, gray, green or brown) and/or eschar (tan, brown or black) in the wound bed. Sacral  (Active)  Wound Image   11/23/2017 12:00 PM  Dressing Type ABD;Barrier Film (skin prep);Gauze (Comment);Moist to dry 11/23/2017 12:00 PM  Dressing Changed 11/23/2017 12:00 PM  Dressing Change Frequency Twice a day 11/23/2017 12:00 PM  State of Healing Non-healing 11/23/2017 12:00 PM  Site / Wound Assessment Yellow;Brown 11/23/2017 12:00 PM  % Wound base Yellow/Fibrinous Exudate 100% 11/23/2017 12:00 PM  % Wound base Black/Eschar 75% 11/21/2017 12:45 PM  Peri-wound Assessment Pink;Intact;Erythema (blanchable) 11/23/2017 12:00 PM  Wound Length (cm) 4 cm 11/23/2017 12:00 PM  Wound Width (cm) 8.5 cm 11/23/2017 12:00 PM  Wound Surface Area (cm^2) 34 cm^2 11/23/2017 12:00 PM  Drainage Amount Minimal 11/23/2017 12:00 PM  Drainage Description Serosanguineous 11/23/2017 12:00 PM  Treatment Debridement (Selective);Hydrotherapy (Pulse lavage);Packing (Saline gauze) 11/23/2017 12:00 PM   Santyl applied to necrotic tissue   Hydrotherapy Pulsed lavage therapy - wound location: sacrum Pulsed Lavage with Suction (psi): 4 psi(4-12 psi) Pulsed Lavage with Suction - Normal Saline Used: 1000 mL Pulsed Lavage Tip: Tip with splash shield Selective Debridement Selective Debridement - Location: sacrum Selective Debridement - Tools Used: Forceps;Scissors Selective Debridement - Tissue  Removed: yellow/brown hard slough, remaining hard slough scored for better enzymatic debridement   Wound Assessment and Plan  Wound Therapy - Assess/Plan/Recommendations Wound Therapy - Clinical Statement: Hard yellow slough starting to breakdown with Santyl and pulsed lavage however is still tightly adhered. Minimal amount of slough removed and no viable tissue yet visualized. Difficult to access pt wound due to pt contracture and increased pain with movement. Pt will continue to benefit from hydrotherapy to soften slough for removal.  Factors Delaying/Impairing Wound Healing: Incontinence;Immobility;Multiple medical problems;Polypharmacy Hydrotherapy Plan: Debridement;Dressing change;Patient/family education;Pulsatile lavage with suction Wound Therapy - Frequency: 6X / week Wound Therapy - Follow Up Recommendations: Skilled nursing facility Wound Plan: see above  Wound Therapy Goals- Improve the function of patient's integumentary system by progressing the wound(s) through the phases of wound healing (inflammation - proliferation - remodeling) by: Decrease Necrotic Tissue to: 70 Decrease Necrotic Tissue - Progress: Not progressing Increase Granulation Tissue to: 30 Increase Granulation Tissue - Progress: Mot progressing Goals/treatment plan/discharge plan were made with and agreed upon by patient/family: No, Patient unable to participate in goals/treatment/discharge plan and family unavailable Time For Goal Achievement: 7 days Wound Therapy - Potential for Goals: Fair  Goals will be updated until maximal potential achieved or discharge criteria met.  Discharge criteria: when goals achieved, discharge from hospital, MD decision/surgical intervention, no progress towards goals, refusal/missing three consecutive treatments without notification or medical reason.  GP    Dani Gobble. Migdalia Dk PT, DPT Acute Rehabilitation  [phone] Pager [phone]   [place] 11/23/2017, 12:17 PM

## 2017-11-23 NOTE — Progress Notes (Signed)
[facility]  Received request from [place] for family interest in [facility]. Met with sister [name] to complete paper work for transfer today.   RN please call report to [phone].  Please send discharge summary to [phone].  Thank you,  Erling Conte, LCSW [phone]

## 2017-11-23 NOTE — Clinical Social Work Note (Signed)
[facility]. Sister will go to facility at 4:30 to complete paperwork. CSW paged MD. DNR on chart to be signed.  Charlynn CourtSarah Omarr Hann, CSW [phone]

## 2017-11-28 ENCOUNTER — Encounter: Payer: Medicare (Managed Care) | Admitting: Neurology

## 2017-11-29 ENCOUNTER — Telehealth: Payer: Self-pay

## 2017-11-29 NOTE — Telephone Encounter (Signed)
-----   Message from Marvel PlanJindong Xu, MD sent at 11/28/2017  5:03 PM EDT ----- Could you please let the patient know that the heart monitoring test done recently was negative for irregular heart beats. Please continue current treatment. Thanks.  Marvel PlanJindong Xu, MD PhD Stroke Neurology 11/28/2017 5:03 PM

## 2017-11-29 NOTE — Telephone Encounter (Signed)
Notes recorded by Hildred AlaminMurrell, Katrina Y, RN on 11/29/2017 at 4:16 PM EDT Left vm for patients sister Chyrl CivatteJoann on dpr to call back about cardiac results. ------

## 2017-12-01 NOTE — Telephone Encounter (Signed)
Rn call patients sister [name] on dpr to give results. Rn explain pt heart monitor was negative for any irregular heart beats. Continue treatment plan. Pts sister verbalized understanding. ------

## 2017-12-14 ENCOUNTER — Other Ambulatory Visit (HOSPITAL_COMMUNITY): Payer: Self-pay | Admitting: Family Medicine

## 2017-12-14 DIAGNOSIS — I739 Peripheral vascular disease, unspecified: Secondary | ICD-10-CM

## 2018-01-14 DEATH — deceased

## 2018-02-08 ENCOUNTER — Ambulatory Visit: Payer: Medicare (Managed Care) | Admitting: Adult Health

## 2018-03-13 ENCOUNTER — Other Ambulatory Visit (HOSPITAL_COMMUNITY): Payer: Self-pay | Admitting: Family Medicine

## 2018-03-13 DIAGNOSIS — I6529 Occlusion and stenosis of unspecified carotid artery: Secondary | ICD-10-CM

## 2018-03-20 ENCOUNTER — Encounter (HOSPITAL_COMMUNITY): Payer: Medicare (Managed Care)

## 2018-03-20 ENCOUNTER — Ambulatory Visit (HOSPITAL_COMMUNITY): Admission: RE | Admit: 2018-03-20 | Payer: Medicare (Managed Care) | Source: Ambulatory Visit

## 2019-12-05 IMAGING — DX DG CHEST 2V
2 series · 2 of 2 positions shown · non-contrast
Comparison: 05/06/2016

CLINICAL DATA: 61-year-old male with dizziness, slight dyspnea and
chest pain. Recent fall.

EXAM:
CHEST  2 VIEW

[x chest ap]
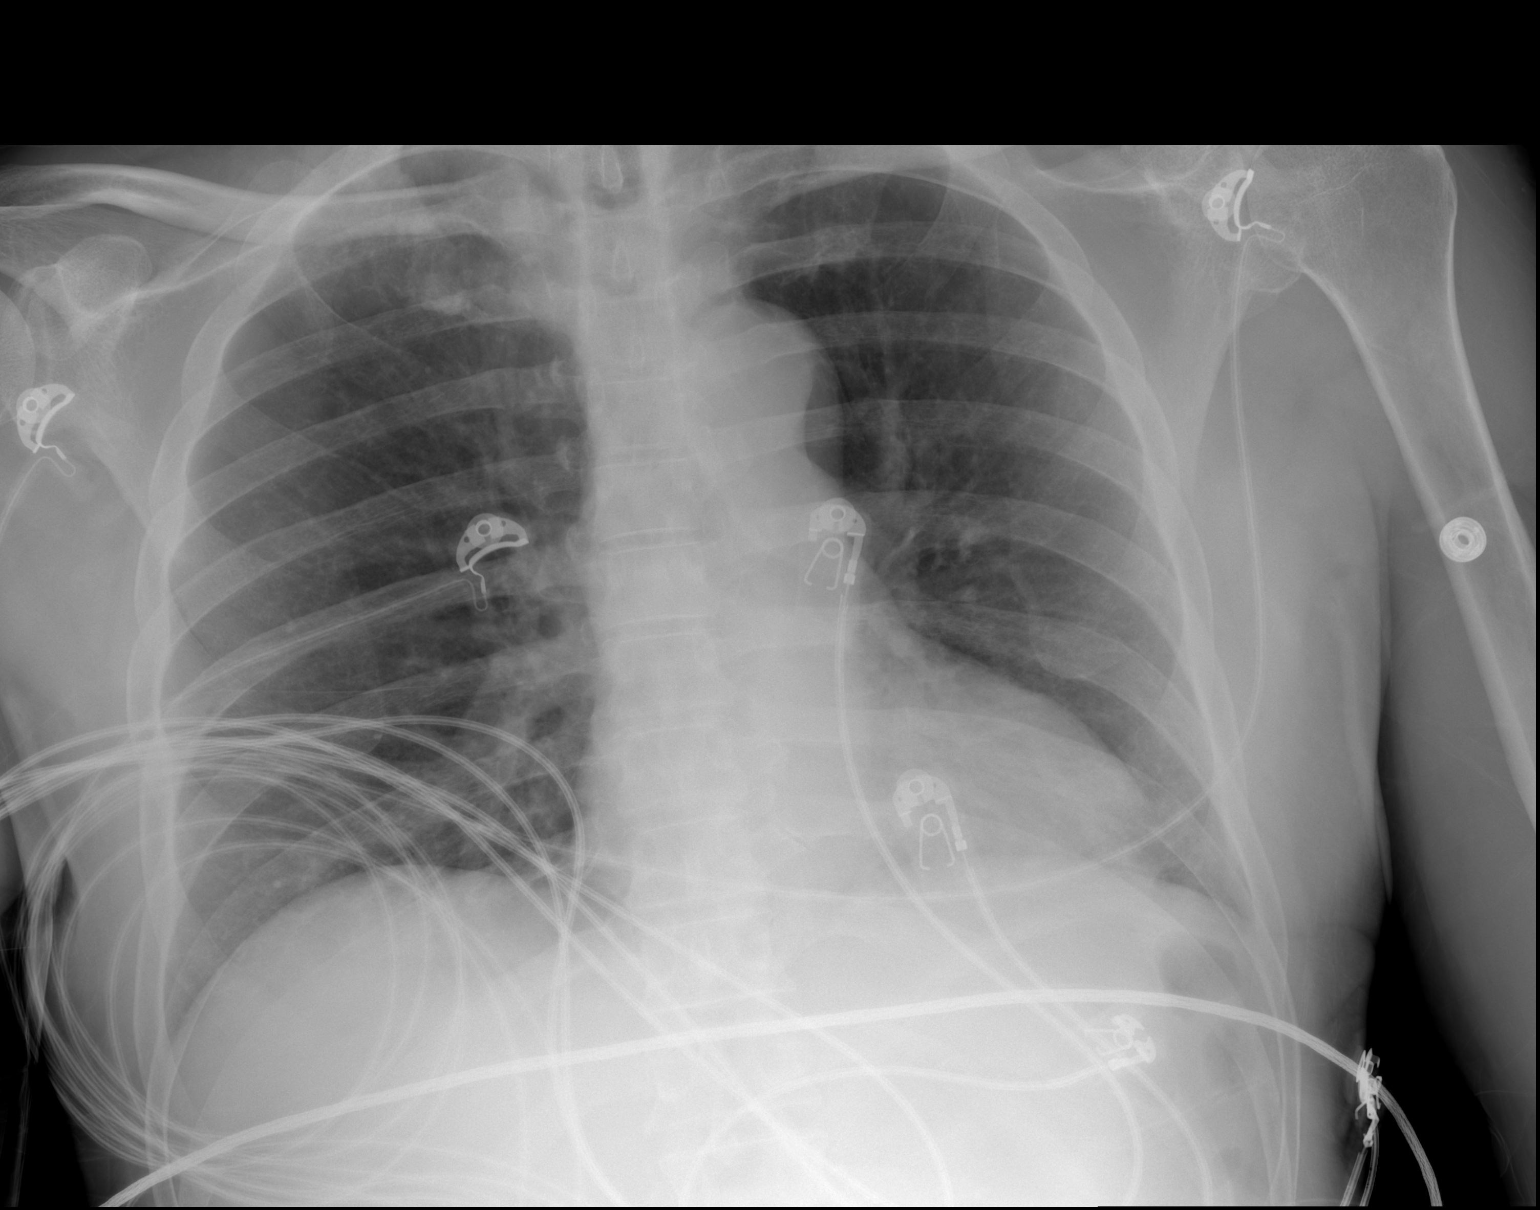

[w chest lat]
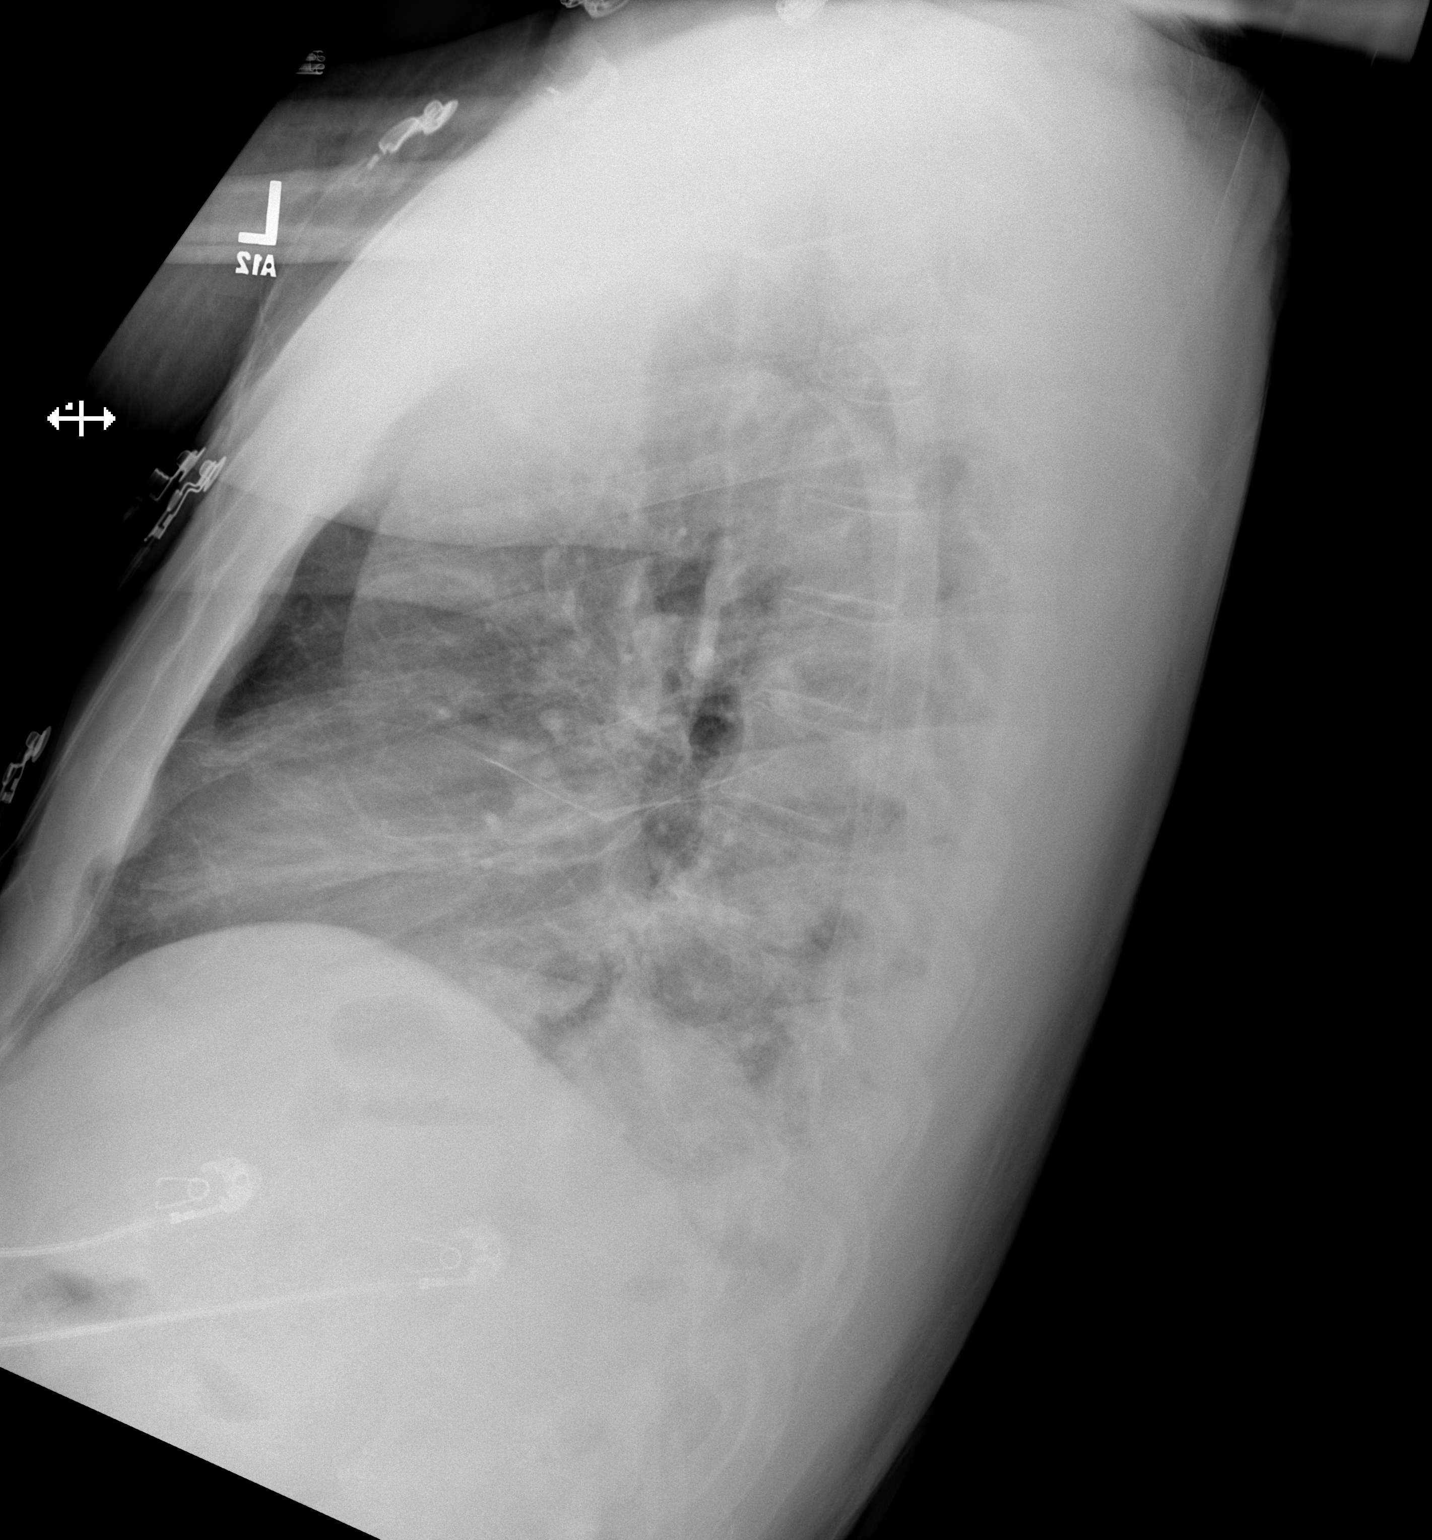

[2 of 2 positions shown; findings below may reference images not displayed]

FINDINGS: The heart size and mediastinal contours are within normal limits.
Lung volumes are slightly low relative to previous exam. This may
explain the crowding of interstitial lung markings probable
atelectasis at the left lung base. The visualized skeletal
structures are unremarkable.
IMPRESSION: No active cardiopulmonary disease. Slightly low lung volumes with
left basilar atelectasis.
